# Patient Record
Sex: Male | Born: 1935 | Race: White | Hispanic: No | Marital: Married | Smoking: Former smoker
Health system: Southern US, Community
[De-identification: ages and names within clinical notes are randomized; demographics above are authoritative.]

## PROBLEM LIST (undated history)

## (undated) DIAGNOSIS — H3530 Unspecified macular degeneration: Secondary | ICD-10-CM

## (undated) DIAGNOSIS — I10 Essential (primary) hypertension: Secondary | ICD-10-CM

## (undated) DIAGNOSIS — I499 Cardiac arrhythmia, unspecified: Secondary | ICD-10-CM

## (undated) DIAGNOSIS — K219 Gastro-esophageal reflux disease without esophagitis: Secondary | ICD-10-CM

## (undated) DIAGNOSIS — I5189 Other ill-defined heart diseases: Secondary | ICD-10-CM

## (undated) DIAGNOSIS — Z9289 Personal history of other medical treatment: Secondary | ICD-10-CM

## (undated) DIAGNOSIS — R55 Syncope and collapse: Secondary | ICD-10-CM

## (undated) DIAGNOSIS — T4145XA Adverse effect of unspecified anesthetic, initial encounter: Secondary | ICD-10-CM

## (undated) DIAGNOSIS — T8859XA Other complications of anesthesia, initial encounter: Secondary | ICD-10-CM

## (undated) DIAGNOSIS — Z9889 Other specified postprocedural states: Secondary | ICD-10-CM

## (undated) DIAGNOSIS — I509 Heart failure, unspecified: Secondary | ICD-10-CM

## (undated) DIAGNOSIS — I4819 Other persistent atrial fibrillation: Secondary | ICD-10-CM

## (undated) DIAGNOSIS — I2510 Atherosclerotic heart disease of native coronary artery without angina pectoris: Secondary | ICD-10-CM

## (undated) DIAGNOSIS — IMO0001 Reserved for inherently not codable concepts without codable children: Secondary | ICD-10-CM

## (undated) DIAGNOSIS — C801 Malignant (primary) neoplasm, unspecified: Secondary | ICD-10-CM

## (undated) DIAGNOSIS — R112 Nausea with vomiting, unspecified: Secondary | ICD-10-CM

## (undated) DIAGNOSIS — Z950 Presence of cardiac pacemaker: Secondary | ICD-10-CM

## (undated) DIAGNOSIS — I495 Sick sinus syndrome: Secondary | ICD-10-CM

## (undated) HISTORY — DX: Syncope and collapse: R55

## (undated) HISTORY — DX: Essential (primary) hypertension: I10

## (undated) HISTORY — DX: Malignant (primary) neoplasm, unspecified: C80.1

## (undated) HISTORY — DX: Unspecified macular degeneration: H35.30

## (undated) HISTORY — DX: Atherosclerotic heart disease of native coronary artery without angina pectoris: I25.10

## (undated) HISTORY — DX: Other persistent atrial fibrillation: I48.19

## (undated) HISTORY — DX: Sick sinus syndrome: I49.5

## (undated) HISTORY — PX: OTHER SURGICAL HISTORY: SHX169

## (undated) HISTORY — DX: Other ill-defined heart diseases: I51.89

---

## 2002-11-19 ENCOUNTER — Encounter (INDEPENDENT_AMBULATORY_CARE_PROVIDER_SITE_OTHER): Payer: Self-pay | Admitting: *Deleted

## 2002-11-19 ENCOUNTER — Ambulatory Visit (HOSPITAL_BASED_OUTPATIENT_CLINIC_OR_DEPARTMENT_OTHER): Admission: RE | Admit: 2002-11-19 | Discharge: 2002-11-19 | Payer: Self-pay | Admitting: General Surgery

## 2002-11-19 HISTORY — PX: OTHER SURGICAL HISTORY: SHX169

## 2005-04-15 ENCOUNTER — Inpatient Hospital Stay (HOSPITAL_COMMUNITY): Admission: EM | Admit: 2005-04-15 | Discharge: 2005-04-16 | Payer: Self-pay | Admitting: Emergency Medicine

## 2005-11-27 ENCOUNTER — Ambulatory Visit (HOSPITAL_COMMUNITY): Admission: RE | Admit: 2005-11-27 | Discharge: 2005-11-27 | Payer: Self-pay | Admitting: *Deleted

## 2005-11-27 ENCOUNTER — Encounter (INDEPENDENT_AMBULATORY_CARE_PROVIDER_SITE_OTHER): Payer: Self-pay | Admitting: *Deleted

## 2008-01-25 ENCOUNTER — Emergency Department (HOSPITAL_COMMUNITY): Admission: EM | Admit: 2008-01-25 | Discharge: 2008-01-25 | Payer: Self-pay | Admitting: Emergency Medicine

## 2008-01-26 ENCOUNTER — Emergency Department (HOSPITAL_COMMUNITY): Admission: EM | Admit: 2008-01-26 | Discharge: 2008-01-27 | Payer: Self-pay | Admitting: Emergency Medicine

## 2008-09-17 DIAGNOSIS — Z950 Presence of cardiac pacemaker: Secondary | ICD-10-CM

## 2008-09-17 DIAGNOSIS — I2510 Atherosclerotic heart disease of native coronary artery without angina pectoris: Secondary | ICD-10-CM

## 2008-09-17 HISTORY — DX: Atherosclerotic heart disease of native coronary artery without angina pectoris: I25.10

## 2008-09-17 HISTORY — DX: Presence of cardiac pacemaker: Z95.0

## 2008-09-26 ENCOUNTER — Ambulatory Visit: Payer: Self-pay | Admitting: Internal Medicine

## 2008-09-26 ENCOUNTER — Inpatient Hospital Stay (HOSPITAL_COMMUNITY): Admission: EM | Admit: 2008-09-26 | Discharge: 2008-09-29 | Payer: Self-pay | Admitting: Emergency Medicine

## 2008-09-27 ENCOUNTER — Ambulatory Visit: Payer: Self-pay | Admitting: *Deleted

## 2008-09-27 ENCOUNTER — Encounter (INDEPENDENT_AMBULATORY_CARE_PROVIDER_SITE_OTHER): Payer: Self-pay | Admitting: *Deleted

## 2008-09-28 HISTORY — PX: PACEMAKER INSERTION: SHX728

## 2008-10-04 ENCOUNTER — Inpatient Hospital Stay (HOSPITAL_COMMUNITY): Admission: EM | Admit: 2008-10-04 | Discharge: 2008-10-07 | Payer: Self-pay | Admitting: Emergency Medicine

## 2008-10-04 ENCOUNTER — Ambulatory Visit: Payer: Self-pay | Admitting: Internal Medicine

## 2008-10-14 ENCOUNTER — Encounter (INDEPENDENT_AMBULATORY_CARE_PROVIDER_SITE_OTHER): Payer: Self-pay | Admitting: *Deleted

## 2008-10-14 ENCOUNTER — Ambulatory Visit: Payer: Self-pay

## 2008-10-21 ENCOUNTER — Ambulatory Visit: Payer: Self-pay | Admitting: Cardiology

## 2008-12-28 DIAGNOSIS — I10 Essential (primary) hypertension: Secondary | ICD-10-CM | POA: Insufficient documentation

## 2008-12-28 DIAGNOSIS — K219 Gastro-esophageal reflux disease without esophagitis: Secondary | ICD-10-CM

## 2008-12-28 DIAGNOSIS — R55 Syncope and collapse: Secondary | ICD-10-CM | POA: Insufficient documentation

## 2008-12-28 DIAGNOSIS — I2510 Atherosclerotic heart disease of native coronary artery without angina pectoris: Secondary | ICD-10-CM

## 2008-12-28 DIAGNOSIS — I495 Sick sinus syndrome: Secondary | ICD-10-CM | POA: Insufficient documentation

## 2008-12-28 DIAGNOSIS — H3530 Unspecified macular degeneration: Secondary | ICD-10-CM | POA: Insufficient documentation

## 2008-12-29 ENCOUNTER — Encounter: Payer: Self-pay | Admitting: Cardiology

## 2008-12-29 ENCOUNTER — Ambulatory Visit: Payer: Self-pay | Admitting: Cardiology

## 2009-05-13 ENCOUNTER — Encounter: Payer: Self-pay | Admitting: Cardiology

## 2009-09-14 ENCOUNTER — Encounter: Payer: Self-pay | Admitting: Cardiology

## 2009-09-14 ENCOUNTER — Ambulatory Visit: Payer: Self-pay

## 2009-10-12 ENCOUNTER — Telehealth: Payer: Self-pay | Admitting: Cardiology

## 2009-10-14 ENCOUNTER — Telehealth (INDEPENDENT_AMBULATORY_CARE_PROVIDER_SITE_OTHER): Payer: Self-pay | Admitting: *Deleted

## 2009-10-26 ENCOUNTER — Ambulatory Visit: Payer: Self-pay | Admitting: Cardiology

## 2009-10-26 DIAGNOSIS — E785 Hyperlipidemia, unspecified: Secondary | ICD-10-CM | POA: Insufficient documentation

## 2009-10-28 ENCOUNTER — Ambulatory Visit (HOSPITAL_COMMUNITY): Admission: RE | Admit: 2009-10-28 | Discharge: 2009-10-29 | Payer: Self-pay | Admitting: General Surgery

## 2009-10-28 ENCOUNTER — Encounter (INDEPENDENT_AMBULATORY_CARE_PROVIDER_SITE_OTHER): Payer: Self-pay | Admitting: General Surgery

## 2009-10-28 HISTORY — PX: OTHER SURGICAL HISTORY: SHX169

## 2010-01-25 ENCOUNTER — Ambulatory Visit: Payer: Self-pay | Admitting: Cardiology

## 2010-04-26 ENCOUNTER — Ambulatory Visit: Payer: Self-pay | Admitting: Cardiology

## 2010-07-14 ENCOUNTER — Encounter (INDEPENDENT_AMBULATORY_CARE_PROVIDER_SITE_OTHER): Payer: Self-pay | Admitting: *Deleted

## 2010-07-26 ENCOUNTER — Ambulatory Visit: Payer: Self-pay | Admitting: Cardiology

## 2010-10-17 NOTE — Cardiovascular Report (Signed)
Summary: Office Visit   Office Visit   Imported By: Roderic Ovens 10/25/2009 15:39:37  _____________________________________________________________________  External Attachment:    Type:   Image     Comment:   External Document

## 2010-10-17 NOTE — Cardiovascular Report (Signed)
Summary: TTM   TTM   Imported By: Roderic Ovens 06/15/2010 16:09:46  _____________________________________________________________________  External Attachment:    Type:   Image     Comment:   External Document

## 2010-10-17 NOTE — Cardiovascular Report (Signed)
Summary: Office Visit   Office Visit   Imported By: Roderic Ovens 11/04/2009 13:02:19  _____________________________________________________________________  External Attachment:    Type:   Image     Comment:   External Document
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
## 2010-10-17 NOTE — Miscellaneous (Signed)
Summary: dx correction  Clinical Lists Changes  Problems: Changed problem from PACEMAKER (ICD-V45.[name]01) to PACEMAKER, PERMANENT (ICD-V45.01) changed the incorrect dx code to correct dx code Genella Mech  July 14, 2010 1:00 PM

## 2010-10-17 NOTE — Cardiovascular Report (Signed)
Summary: TTM   TTM   Imported By: Roderic Ovens 03/31/2010 15:53:01  _____________________________________________________________________  External Attachment:    Type:   Image     Comment:   External Document

## 2010-10-17 NOTE — Progress Notes (Signed)
  Phone Note From Other Clinic   Caller: [facility] Pre-Surgical Details for Reason: Pt.Information Initial call taken by: Denny Peon    Faxed all Cardiac over to [phone] [facility]  October 14, 2009 2:00 PM

## 2010-10-17 NOTE — Progress Notes (Signed)
Summary: pt having surgery...pacer question  Phone Note Call from Patient Call back at Home Phone [phone]   Caller: Patient Reason for Call: Talk to Nurse, Talk to Doctor Summary of Call: pt having surgery on 2/11 with Dr. Zachery Dakins and the MD wants to know if he can use a magnet on the pacer or turn it off and on pt would like Dr. Juanda Chance to call and talk to Dr. Zachery Dakins about this and then call pt and let him know the out come Initial call taken by: Omer Jack,  October 12, 2009 8:28 AM  Follow-up for Phone Call        I spoke with the pt's wife- the pt is scheduled for hernia surgery on 2/11 under general anesthesia. I made the pt's wife aware we would touch base with Dr. Annette Stable office today. I will call them back after we do so.Sherri Rad, RN, BSN  October 12, 2009 11:13 AM  I called and spoke with Kendal Hymen at Dr. Annette Stable office. She is not clear what is needed. She will discuss wtih Dr. Zachery Dakins when he comes in to the office this afternoon and they will call us back. Sherri Rad, RN, BSN  October 12, 2009 11:17 AM  I called Dr. Annette Stable office back. I could not get a clear answer as to what was needed from them, I did explain that per Dr. Juanda Chance it is ok to use a magnet if needed. I called the pt's wife back and made her aware of the above.  Follow-up by: Sherri Rad, RN, BSN,  October 13, 2009 4:20 PM

## 2010-10-19 NOTE — Cardiovascular Report (Signed)
Summary: TTM   TTM   Imported By: Roderic Ovens 09/13/2010 15:47:49  _____________________________________________________________________  External Attachment:    Type:   Image     Comment:   External Document

## 2010-10-20 ENCOUNTER — Encounter (INDEPENDENT_AMBULATORY_CARE_PROVIDER_SITE_OTHER): Payer: 59 | Admitting: Internal Medicine

## 2010-10-20 ENCOUNTER — Encounter: Payer: Self-pay | Admitting: Internal Medicine

## 2010-10-20 ENCOUNTER — Ambulatory Visit: Admit: 2010-10-20 | Payer: Self-pay | Admitting: Internal Medicine

## 2010-10-20 DIAGNOSIS — I495 Sick sinus syndrome: Secondary | ICD-10-CM

## 2010-10-20 DIAGNOSIS — I4891 Unspecified atrial fibrillation: Secondary | ICD-10-CM

## 2010-10-20 DIAGNOSIS — I4821 Permanent atrial fibrillation: Secondary | ICD-10-CM | POA: Insufficient documentation

## 2010-10-25 NOTE — Assessment & Plan Note (Signed)
Summary: f1y/per check out/2/9/11hm/rs pt from [facility]/mt   Vital Signs:  Patient profile:   77 year old male Height:      69 inches Weight:      188.75 pounds BMI:     27.97 Pulse rate:   54 / minute Pulse rhythm:   irregular BP sitting:   133 / 67  (left arm) Cuff size:   large  Vitals Entered By: Danielle Rankin, CMA (October 20, 2010 3:28 PM)  Visit Type:  Pacemaker check Primary Provider:  Juleen China  CC:   .  History of Present Illness: The patient presents today for routine cardiology followup. He reports doing very well since last being seen in our clinic. The patient denies symptoms of palpitations, chest pain, shortness of breath, orthopnea, PND, lower extremity edema, dizziness, presyncope, syncope, or neurologic sequela. The patient is tolerating medications without difficulties and is otherwise without complaint today.   Current Medications (verified): 1)  Icaps  Caps (Multiple Vitamins-Minerals) .... Take 1 Once Daily 2)  Prilosec Otc 20 Mg Tbec (Omeprazole Magnesium) .... Take 1 Once Daily 3)  Adult Aspirin Low Strength 81 Mg Tbdp (Aspirin) .... Take 1 Once Daily 4)  Mucinex Dm 30-600 Mg Xr12h-Tab (Dextromethorphan-Guaifenesin) .... Take 1 As Needed 5)  Pravastatin Sodium 80 Mg Tabs (Pravastatin Sodium) .[name].. 1 Tab At Bedtime 6)  Cephalexin 500 Mg Caps (Cephalexin) .[name].. 1 Cap Four Times Daily  Allergies (verified): No Known Drug Allergies  Past History:  Past Medical History: SYNCOPE  s/p PPM for bradycardia nonsustained VT nonobstructive CAD by cath 10/04/08 with nl LV function MACULAR DEGENERATION OF RETINA UNSPECIFIED (ICD-362.50) GERD (ICD-530.81)  Social History: denies tobacco or ETOH  Physical Exam  General:  Well developed, well nourished, in no acute distress. Head:  normocephalic and atraumatic Eyes:  PERRLA/EOM intact; conjunctiva and lids normal. Mouth:  Teeth, gums and palate normal. Oral mucosa normal. Neck:  supple Chest Wall:  pacemaker  pocket is well healed Lungs:  Clear bilaterally to auscultation and percussion. Heart:  RRR, no m/r/g Abdomen:  Bowel sounds positive; abdomen soft and non-tender without masses, organomegaly, or hernias noted. No hepatosplenomegaly. Msk:  Back normal, normal gait. Muscle strength and tone normal. Extremities:  No clubbing or cyanosis. Neurologic:  Alert and oriented x 3. Skin:  Intact without lesions or rashes.   Impression & Recommendations:  Problem # 1:  PACEMAKER, PERMANENT (ICD-V45.01) normal pacemaker function for bradycardia as above  Problem # 2:  SYNCOPE (ICD-780.2) no recurrence s/p PPM  Problem # 3:  PAROXYSMAL ATRIAL FIBRILLATION (ICD-427.31) documented by PPM His CHADS2 score is 1.  He is very clear that he wishes to continue ASA and avoid coumadin at this time  Patient Instructions: 1)  Your physician wants you to follow-up in:6 months with device clinic and 12 months with Dr Johney Frame  Bonita Quin will receive a reminder letter in the mail two months in advance. If you don't receive a letter, please call our office to schedule the follow-up appointment. 2)  Your physician recommends that you continue on your current medications as directed. Please refer to the Current Medication list given to you today.      PPM Specifications Following MD:  Everardo Beals. Juanda Chance, MD     PPM Vendor:  St Jude     PPM Model Number:  [id number]     PPM Serial Number:  [id number] PPM DOI:  09/28/2008     PPM Implanting MD:  Everardo Beals. Juanda Chance, MD  Lead 1  Location: RA     DOI: 09/28/2008     Model #: [id number]     Serial #: [id number]     Status: active Lead 2    Location: RV     DOI: 09/28/2008     Model #: 1788TC     Serial #: [id number]     Status: active  Magnet Response Rate:  BOL 98.6 ERI 86.3  Indications:  Syncope  Explantation Comments:  n/a  PPM Follow Up Battery Voltage:  2.80 V     Battery Est. Longevity:  5.25-7.25 yrs     Pacer Dependent:  No       PPM Device Measurements Atrium  Amplitude:  5.0 mV, Impedance: 505 ohms, Threshold: 0.50 V at 0.4 msec Right Ventricle  Amplitude: 4.4 mV, Impedance: 357 ohms, Threshold: 0.750 V at 0.4 msec  Episodes MS Episodes:  3     Percent Mode Switch:  <1%     Coumadin:  No Ventricular High Rate:  0     Atrial Pacing:  36%     Ventricular Pacing:  13%  Parameters Mode:  DDD     Lower Rate Limit:  55     Upper Rate Limit:  100 Paced AV Delay:  300     Sensed AV Delay:  300 Next Cardiology Appt Due:  04/18/2011 Tech Comments:  3 AMS EPISODES--LONGEST WAS 8 HRS 58 MINUTES.  1 SINUS TACH.  NORMAL DEVICE FUNCTION.  CHANGED VENTRICULAR SENSITIVITY FROM 2.0 TO 1.83mV AND TURNED RATE RESPONSE ON DURING MODE SWITCH.  TTMS THRU [facility]. ROV IN 6 MTHS W/DEVICE CLINIC. Vella Kohler  October 20, 2010 3:51 PM

## 2010-11-02 NOTE — Cardiovascular Report (Signed)
Summary: Office Visit   Office Visit   Imported By: Roderic Ovens 10/25/2010 10:04:47  _____________________________________________________________________  External Attachment:    Type:   Image     Comment:   External Document

## 2010-12-07 LAB — DIFFERENTIAL
Basophils Relative: 0 % (ref 0–1)
Eosinophils Absolute: 0.3 10*3/uL (ref 0.0–0.7)
Eosinophils Relative: 5 % (ref 0–5)
Lymphs Abs: 1.4 10*3/uL (ref 0.7–4.0)
Monocytes Absolute: 0.7 10*3/uL (ref 0.1–1.0)
Monocytes Relative: 12 % (ref 3–12)

## 2010-12-07 LAB — COMPREHENSIVE METABOLIC PANEL
ALT: 24 U/L (ref 0–53)
Albumin: 3.5 g/dL (ref 3.5–5.2)
Alkaline Phosphatase: 71 U/L (ref 39–117)
Calcium: 9 mg/dL (ref 8.4–10.5)
Chloride: 105 mEq/L (ref 96–112)
Creatinine, Ser: 0.87 mg/dL (ref 0.4–1.5)
GFR calc Af Amer: >60 mL/min (ref >60)
Glucose, Bld: 104 mg/dL — ABNORMAL HIGH (ref 70–99)
Potassium: 3.9 mEq/L (ref 3.5–5.1)

## 2010-12-07 LAB — CBC
HCT: 43.9 % (ref 39.0–52.0)
Hemoglobin: 14.9 g/dL (ref 13.0–17.0)
MCHC: 33.9 g/dL (ref 30.0–36.0)
MCV: 90.5 fL (ref 78.0–100.0)
Platelets: 184 10*3/uL (ref 150–400)
RDW: 13.4 % (ref 11.5–15.5)
WBC: 6 10*3/uL (ref 4.0–10.5)

## 2011-01-01 LAB — BASIC METABOLIC PANEL
BUN: 14 mg/dL (ref 6–23)
BUN: 12 mg/dL (ref 6–23)
BUN: 13 mg/dL (ref 6–23)
CO2: 25 mEq/L (ref 19–32)
CO2: 27 mEq/L (ref 19–32)
CO2: 26 mEq/L (ref 19–32)
Calcium: 8.7 mg/dL (ref 8.4–10.5)
Calcium: 8.8 mg/dL (ref 8.4–10.5)
Chloride: 108 mEq/L (ref 96–112)
Chloride: 105 mEq/L (ref 96–112)
Creatinine, Ser: 0.85 mg/dL (ref 0.4–1.5)
Creatinine, Ser: 0.84 mg/dL (ref 0.4–1.5)
Creatinine, Ser: 0.89 mg/dL (ref 0.4–1.5)
GFR calc non Af Amer: >60 mL/min (ref >60)
Glucose, Bld: 109 mg/dL — ABNORMAL HIGH (ref 70–99)
Glucose, Bld: 91 mg/dL (ref 70–99)
Potassium: 4 mEq/L (ref 3.5–5.1)

## 2011-01-01 LAB — CBC
HCT: 44.5 % (ref 39.0–52.0)
HCT: 45.9 % (ref 39.0–52.0)
Hemoglobin: 14.5 g/dL (ref 13.0–17.0)
MCHC: 32.6 g/dL (ref 30.0–36.0)
MCHC: 33.7 g/dL (ref 30.0–36.0)
MCHC: 33.1 g/dL (ref 30.0–36.0)
MCV: 92 fL (ref 78.0–100.0)
MCV: 90.6 fL (ref 78.0–100.0)
Platelets: 218 10*3/uL (ref 150–400)
Platelets: 182 10*3/uL (ref 150–400)
Platelets: 189 10*3/uL (ref 150–400)
RBC: 4.66 MIL/uL (ref 4.22–5.81)
RBC: 4.84 MIL/uL (ref 4.22–5.81)
RDW: 13.5 % (ref 11.5–15.5)
RDW: 12.6 % (ref 11.5–15.5)
RDW: 12.8 % (ref 11.5–15.5)
WBC: 8.2 10*3/uL (ref 4.0–10.5)
WBC: 7.2 10*3/uL (ref 4.0–10.5)

## 2011-01-01 LAB — DIFFERENTIAL
Basophils Absolute: 0 10*3/uL (ref 0.0–0.1)
Basophils Absolute: 0.1 10*3/uL (ref 0.0–0.1)
Basophils Relative: 0 % (ref 0–1)
Eosinophils Absolute: 0.1 10*3/uL (ref 0.0–0.7)
Eosinophils Relative: 1 % (ref 0–5)
Eosinophils Relative: 1 % (ref 0–5)
Lymphocytes Relative: 13 % (ref 12–46)
Lymphs Abs: 1.2 10*3/uL (ref 0.7–4.0)
Monocytes Absolute: 0.7 10*3/uL (ref 0.1–1.0)
Neutrophils Relative %: 75 % (ref 43–77)

## 2011-01-01 LAB — POCT CARDIAC MARKERS: Troponin i, poc: <0.05 ng/mL (ref 0.00–0.09)

## 2011-01-01 LAB — CARDIAC PANEL(CRET KIN+CKTOT+MB+TROPI)
CK, MB: 2.8 ng/mL (ref 0.3–4.0)
Relative Index: 1.8 (ref 0.0–2.5)
Relative Index: INVALID (ref 0.0–2.5)
Total CK: 160 U/L (ref 7–232)
Total CK: 84 U/L (ref 7–232)
Troponin I: 0.01 ng/mL (ref 0.00–0.06)

## 2011-01-01 LAB — CK TOTAL AND CKMB (NOT AT ARMC): Relative Index: 3 — ABNORMAL HIGH (ref 0.0–2.5)

## 2011-01-01 LAB — COMPREHENSIVE METABOLIC PANEL
Albumin: 3.5 g/dL (ref 3.5–5.2)
BUN: 15 mg/dL (ref 6–23)
Creatinine, Ser: 0.84 mg/dL (ref 0.4–1.5)
GFR calc Af Amer: >60 mL/min (ref >60)
Glucose, Bld: 101 mg/dL — ABNORMAL HIGH (ref 70–99)
Total Protein: 6.8 g/dL (ref 6.0–8.3)

## 2011-01-01 LAB — LIPID PANEL
Cholesterol: 183 mg/dL (ref 0–200)
HDL: 65 mg/dL (ref >39)
LDL Cholesterol: 103 mg/dL — ABNORMAL HIGH (ref 0–99)
Total CHOL/HDL Ratio: 2.8 RATIO

## 2011-01-01 LAB — ALBUMIN: Albumin: 3.3 g/dL — ABNORMAL LOW (ref 3.5–5.2)

## 2011-01-01 LAB — APTT: aPTT: 28 seconds (ref 24–37)

## 2011-01-01 LAB — PROTIME-INR: Prothrombin Time: 13 seconds (ref 11.6–15.2)

## 2011-01-18 ENCOUNTER — Telehealth: Payer: Self-pay | Admitting: Internal Medicine
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
## 2011-01-23 ENCOUNTER — Telehealth: Payer: Self-pay | Admitting: Internal Medicine

## 2011-01-23 NOTE — Telephone Encounter (Signed)
This was put on Dr Amedeo Plenty desk top and I have now spoken to patients wife.  She is going to drop off letter from [facility] so I can show Dr Johney Frame.  This way he will know what the DMV is looking for
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
## 2011-01-24 ENCOUNTER — Encounter: Payer: Self-pay | Admitting: Internal Medicine

## 2011-01-24 DIAGNOSIS — I498 Other specified cardiac arrhythmias: Secondary | ICD-10-CM
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
## 2011-02-02 NOTE — Op Note (Signed)
[name], [name]               ACCOUNT NO.:  [id number]   MEDICAL RECORD NO.:  [id number]          PATIENT TYPE:  AMB   LOCATION:  ENDO                         FACILITY:  [facility]   PHYSICIAN:  Georgiana Spinner, M.D.    DATE OF BIRTH:  1931/02/18   DATE OF PROCEDURE:  11/27/2005  DATE OF DISCHARGE:                                 OPERATIVE REPORT   PROCEDURE:  Upper endoscopy.   INDICATIONS:  Hemoccult positivity.   ANESTHESIA:  Demerol 40, Versed 4 milligrams.   PROCEDURE:  With the patient mildly sedated in the left lateral decubitus  position, the Olympus videoscopic endoscope was inserted the mouth and  passed under direct vision through the esophagus into the stomach.  Fundus,  body, antrum, duodenal bulb, second portion duodenum were visualized.  From  this point the endoscope was slowly withdrawn taking circumferential views  of duodenal mucosa until the endoscope was pulled back in the stomach,  placed in retroflexion to view the stomach from below and the endoscope was  straightened and withdrawn taking circumferential views remaining gastric  and esophageal mucosa stopping in the antrum to biopsy a nodularity there  and also in the esophagus where a flat plaque-like lesion was noted. This  was pretty much removed in its entirety with one biopsy bite.  The endoscope  was withdrawn. The patient's vital signs, pulse oximeter remained stable.  The patient tolerated procedure well without apparent complications.   FINDINGS:  Question of plaque-like lesion in the esophagus and antral  nodule, await biopsy reports. The patient will call me for results and  follow-up with me as an outpatient.  Proceed to colonoscopy as planned.           ______________________________  Georgiana Spinner, M.D.     GMO/MEDQ  D:  11/27/2005  T:  11/28/2005  Job:  [id number]

## 2011-02-02 NOTE — Discharge Summary (Signed)
[name], [name]               ACCOUNT NO.:  [id number]   MEDICAL RECORD NO.:  [id number]          PATIENT TYPE:  INP   LOCATION:  3705                         FACILITY:  [facility]   PHYSICIAN:  Hettie Holstein, D.O.    DATE OF BIRTH:  Jan 05, 1931   DATE OF ADMISSION:  04/15/2005  DATE OF DISCHARGE:  04/16/2005                                 DISCHARGE SUMMARY   PRIMARY CARE PHYSICIAN:  [name], M.D.   ADMISSION DIAGNOSIS:  Atypical chest pain.   DISCHARGE DIAGNOSIS:  Atypical chest pain without evidence of acute ischemic  event during hospitalization.   ADDITIONAL DIAGNOSES:  1.  History of peptic ulcer disease status post vagotomy in 1984.  2.  History of herniorrhaphy by Dr. Zachery Dakins.   DISCHARGE MEDICATIONS:  1.  Over-the-counter Prilosec daily for the next two weeks.  2.  Aspirin 81 mg a day.   HISTORY OF PRESENT ILLNESS:  For full details, please review H&P dictated  yesterday.  However, briefly [name] is a 77 year old Caucasian male with  no chronic medical illnesses.  He was well up until the afternoon when he  felt some indigestion and had some improvement with Tums.  He became  diaphoretic, felt clammy and lightheaded.  This lasted for a short period of  time.  He had no headache.  He subsequently came to the emergency department  for evaluation.  He does have prior history of vagotomy.  He has not had  problems such as this in the past.  He had a stress test in 2004.  His  initial cardiac markers were negative in the emergency department, and EKG  was not revealing of ischemia.   HOSPITAL COURSE:  The patient underwent cycled cardiac markers without  evidence of acute ischemic event.  He was pain free during hospitalization.  He did have bradycardia; however, he was hemodynamically stable and  asymptomatic for the duration of his course.   In any event, his primary care physician's office was called, Dr. Adela Lank, and it was recommended he undergo  further outpatient risk  stratification, perhaps stress test in outpatient setting and followup with  Dr. Juleen China.  He does have appointment on April 23, 2005, and I believe this  is at 1 p.m.  he is discharged medically stable and in improved condition.       ESS/MEDQ  D:  04/16/2005  T:  04/16/2005  Job:  [id number]   cc:   [name], M.D.  [address]  [place]  [place] [address]  Fax: [phone]

## 2011-02-02 NOTE — Op Note (Signed)
[name], [name]                           ACCOUNT NO.:  [id number]   MEDICAL RECORD NO.:  [id number]                   PATIENT TYPE:  AMB   LOCATION:  DSC                                  FACILITY:  [facility]   PHYSICIAN:  Anselm Pancoast. Zachery Dakins, M.D.          DATE OF BIRTH:  1931/02/02   DATE OF PROCEDURE:  11/19/2002  DATE OF DISCHARGE:                                 OPERATIVE REPORT   PREOPERATIVE DIAGNOSIS:  Right inguinal hernia, direct.   POSTOPERATIVE DIAGNOSIS:  Right inguinal hernia, direct.   PROCEDURE:  Right inguinal herniorrhaphy with mesh reinforcement.   ANESTHESIA:  Local with heavy sedation.   HISTORY:  The patient is a 77 year old male referred to me by [name],  M.D., who is for management of a symptomatic right inguinal hernia.  This  had been noted on his physical exam approximately a year ago.  It is  significantly larger now.  He has not had any change in bowel movements or  difficulty voiding, and I had repaired basically a duodenal ulcer about 20  years earlier.  He has an obvious bulge in the right groin that is probably  a direct and lemon-size, and then there is a lipoma a little lateral that is  kind of in the right groin area, and I told him I would remove that  simultaneously.   DESCRIPTION OF PROCEDURE:  The patient was taken to the operative suite,  positioned on the OR table.  Sedation had been administered and 1 g of  Kefzol had been administered, and I shaved or clipped the groin hair and  then prepped it with Betadine solution and draped it in a sterile manner.  The inguinal incision area was infiltrated with a mixture of Xylocaine and  Marcaine with adrenalin and about 10 mL were used to infiltrate the inguinal  incision area.  I was a little more lateral because of this large lipoma  that he would like to have excised, and first I went down and actually freed  this lipoma.  It is kind of a finger-like, twice the size of your thumb,  that I freed it up from the surrounding tissue and went and ligated the  pedicle with a fine Vicryl.  The external oblique was then opened, exposing  the external oblique fascia.  I then carefully opened through the external  ring, and the cord structures were elevated with a Penrose.  It was  necessary to extend the incision medially about an inch because of the  exposure and this being a real large direct inguinal hernia.  I separated  the cord structures and inspected the proximal portion.  There was no  evidence of an indirect hernia sac.  Then this hernia, it was about the size  of a lemon or an orange, but the actual floor of the canal where the hernia  came through was about fifty cent size.  I separated the hernia sac from the  underlying fatty tissue of the preperitoneal space in the lateral aspect of  the bladder and reduced all of this after opening the floor and then  repaired this defect with a Shouldice repair starting at the proximal  portion, suturing the inferior tissue after I debrided the predominant  portion of the hernia sac to the lateral edge of the rectus and then brought  the superior lip over this and sutured it to kind of the pre-inguinal fascia  area.  Next a piece of mesh shaped like a sail, slit laterally, was placed  to reinforce the floor.  This was started at the symphysis pubis and the  inferior limb was sutured with a running 2-0 Prolene and then the two tails  sutured together laterally.  The superior flap was sutured down with  interrupted 2-0 Prolenes.  The little ilioinguinal nerve that I thought I  had saved from the cord structures in the dissection did divide and whether  it was actually the ilioinguinal nerve or fascia, I am not sure.  The  external oblique was then closed over the cord structures and then the  Scarpa's fascia closed, and this was with a 3-0 Vicryl, then 3-0 Vicryl to  kind of close the external oblique, and then Scarpa's fascia  interrupted,  and then a 5-0 Dexon subcuticular and Benzoin and Steri-Strips on the skin.  The patient tolerated  the procedure nicely.  His testicle was in its normal position.  He will be  released after a short stay in the recovery room.  He is instructed not to  do any strenuous activity for probably about three or four weeks.  He works  as kind of a, some farming, and he will need to avoid any lifting of about  25 pounds for at least three to four weeks.                                               Anselm Pancoast. Zachery Dakins, M.D.    WJW/MEDQ  D:  11/19/2002  T:  11/19/2002  Job:  [id number]   cc:   [name], M.D.  [address]  [place]  [place] [address]  Fax: [phone]

## 2011-02-02 NOTE — Op Note (Signed)
NAMESAQIB, CAZAREZ               ACCOUNT NO.:  [id number]   MEDICAL RECORD NO.:  [id number]          PATIENT TYPE:  AMB   LOCATION:  ENDO                         FACILITY:  [facility]   PHYSICIAN:  Georgiana Spinner, M.D.    DATE OF BIRTH:  03/03/1931   DATE OF PROCEDURE:  11/27/2005  DATE OF DISCHARGE:                                 OPERATIVE REPORT   PROCEDURE:  Colonoscopy.   INDICATIONS:  Hemoccult positivity.   ANESTHESIA:  Demerol 20, Versed 2 milligrams.   PROCEDURE:  With the patient mildly sedated in the left lateral decubitus  position, a rectal examination was performed which was unremarkable.  Subsequently the Olympus videoscopic colonoscope was inserted in the rectum,  passed under direct vision to the cecum identified by crow's foot of the  cecum and ileocecal valve, both which were photographed.  From this point  colonoscope was slowly withdrawn taking circumferential views of colonic  mucosa stopping in the sigmoid colon where diverticula were noted until we  reached the rectum which appeared normal on direct showed hemorrhoids on  retroflexed view.  The endoscope was straightened and withdrawn. The  patient's vital signs, pulse oximeter remained stable. The patient tolerated  procedure well without apparent complications.   FINDINGS:  Internal hemorrhoids, diverticulosis of sigmoid colon, otherwise  unremarkable exam.   PLAN:  See endoscopy note for further details.           ______________________________  Georgiana Spinner, M.D.     GMO/MEDQ  D:  11/27/2005  T:  11/28/2005  Job:  [id number]

## 2011-02-02 NOTE — H&P (Signed)
NAMEXAYVIER, VALLEZ               ACCOUNT NO.:  [id number]   MEDICAL RECORD NO.:  [id number]          PATIENT TYPE:  INP   LOCATION:  1824                         FACILITY:  [facility]   PHYSICIAN:  Mobolaji B. Bakare, M.D.DATE OF BIRTH:  1930-12-21   DATE OF ADMISSION:  04/15/2005  DATE OF DISCHARGE:                                HISTORY & PHYSICAL   PRIMARY CARE PHYSICIAN:  [name], M.D.   CHIEF COMPLAINT:  Diaphoresis.   HISTORY OF PRESENT ILLNESS:  Mr. Stopa is a pleasant 77 year old Caucasian  male with no chronic medical illness.  He was well until this afternoon when  he had lunch about 3 p.m.  He developed feeling of indigestion and decided  to use TUMS.  The feeling of indigestion improved with TUMS and after lying  down.  About 5 p.m. he got up to go out with his wife and as he was at the  doorway, he became diaphoretic mainly on his face and neck, feeling clammy  and had tremors, lightheadedness.  There was no shortness of breath nor  chest pain.  Symptoms lasted for 5 minutes and he became normal thereafter.  There was no headache.  He subsequently came to the emergency room for  evaluation.   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  He had an exercise stress test 2 years ago which he said was normal.  He has  not had any anginal symptoms.  No dyspnea on exertion.  He could recall that  he had similar symptoms about 2 years ago.  While in the emergency  department, he had one loose bowel movement.  No chills, no rigors, no  cough, sputum production, and no abdominal pain.   REVIEW OF SYSTEMS:  There is no fever, chills, rigors, no cough or sputum  production, no dyspnea on exertion, orthopnea, or PND.  There is no pedal  edema.  No abdominal pain, dysuria, urgency, or increase in frequency of  micturition.   PAST MEDICAL HISTORY:  Bleeding duodenal ulcer in 1984.   PAST SURGICAL HISTORY:  1.  Vagotomy and possibly pyloroplasty  in 1984.  2.  Hernia repair in 2004, right inguinal hernia repair.   MEDICATIONS:  Aspirin one tablet a day.   ALLERGIES:  No known drug allergies.   FAMILY HISTORY:  Father had heart failure and died at the age of 58.  Mother  had heart attack and died at the age of 85.  Two brothers died of heart  attack in their 68's.  No family history of diabetes mellitus or cancer.   SOCIAL HISTORY:  He quit smoking 30 years ago.  He smoked for about 24  hours.  He does not drink alcohol.  He is very much active and independent  of activities of daily living.  He worked at [employer] in his younger days.   PHYSICAL EXAMINATION:  VITAL SIGNS:  Temperature 97.0, blood pressure  146/80, pulse 53, respiratory rate 18, O2 saturation 98% on room air.  GENERAL:  The patient appears comfortable not  in respiratory distress.  HEENT:  Normocephalic and atraumatic head.  Extraocular movements intact.  Pupils equal, round, and reactive to light and accommodation.  No carotid  bruit.  No cervical lymphadenopathy.  No elevated JVD.  LUNGS:  Clear clinically to auscultation.  CARDIOVASCULAR:  S1 and S2 regular, no murmur, no gallop, no rub.  ABDOMEN:  Appears midline scar, nontender, no other systolic murmur heard.  Bowel sounds present.  No palpable organomegaly.  EXTREMITIES:  No pedal edema, calf tenderness __________ .  Pulses 2+  bilaterally.  NEUROLOGY:  No focal neurological deficit.  SKIN:  No rash, no petechiae.   LABORATORY DATA:  Cardiac enzymes x3 at the point of care were all negative,  myoglobin 63.4, troponin less than 4.05.  Urinalysis was insignificant;  specific gravity of 1.025.  White blood cell count 13,000, hemoglobin 13.8,  hematocrit 40.8, neutrophils 80%, lymphocytes 10%, platelets 231.  PT/INR  12.9/1.0, PTT 25, magnesium 2.1, sodium 137, potassium 4.0, chloride 107,  bicarb 25, glucose 157, BUN 18, creatinine 1.0, bilirubin 0.7, alkaline  phosphatase 56, AST 36, ALT 17, total protein  7.0, albumin 3.6, calcium 8.3.   EKG showed sinus bradycardia with a heart rate of 48 beats per minute, low  voltage QRS.  Of note is that the patient's EKG in 2004 also showed marked  sinus bradycardia of 44.   Chest x-ray showed no acute findings.  There is old structure of right 7th  rib which simulated a nodule.   ASSESSMENT:  Mr. Farias is a 77 year old white male with no chronic medical  illness, presenting with indigestion after lunch.  2 hours later he  developed clammy face, neck, tremors, lightheadedness which lasted 5 minutes  and then subsequently had a loose bowel movement in the emergency room.  Of  note is that he had vagotomy in 1984 for peptic ulcer.  These postprandial  autonomic symptoms may be related to vagotomy.  It could possibly be  postprandial hypoglycemia (stated that this was not documented).   Given the family history of CAD, it would be prudent to admit and rule out  for MI and if he rules out, he would benefit from a stress test.   PLAN:  Admit to telemetry.  Check fasting lipid profile, TSH, serial cardiac  enzymes q.8 hours x3, aspirin 325 mg p.o. daily.  Given Mylanta 30 to 60 mL  q.4 hours p.r.n. for indigestion.  The patient already has sinus  bradycardia, would not use beta blocker at this point.   Sinus bradycardia.  The patient is asymptomatic.   Leukocytosis.  This is probably reactive as there is no obvious source of  infection and no fever.  Will follow up CBC and differential in the a.m.      Mobolaji B. Corky Downs, M.D.  Electronically Signed    MBB/MEDQ  D:  04/15/2005  T:  04/16/2005  Job:  [id number]   cc:   Dr. Murrell Converse

## 2011-02-05 ENCOUNTER — Encounter: Payer: Self-pay | Admitting: Internal Medicine

## 2011-02-05 NOTE — Telephone Encounter (Addendum)
Pt calling re letter for the dmv. Pt states letter needs to be in by 5/26.  Pt would like to talk to a nurse.

## 2011-02-05 NOTE — Telephone Encounter (Signed)
Letter faxed to [facility] today  Wife aware

## 2011-02-05 NOTE — Telephone Encounter (Signed)
Dr Johney Frame will do letter

## 2011-04-26 ENCOUNTER — Encounter: Payer: Self-pay | Admitting: Internal Medicine

## 2011-04-26 DIAGNOSIS — I495 Sick sinus syndrome: Secondary | ICD-10-CM

## 2011-05-15 ENCOUNTER — Emergency Department (HOSPITAL_COMMUNITY): Payer: Medicare Other

## 2011-05-15 ENCOUNTER — Inpatient Hospital Stay (HOSPITAL_COMMUNITY)
Admission: EM | Admit: 2011-05-15 | Discharge: 2011-05-16 | DRG: 446 | Disposition: A | Discharge status: Home / Self Care | Payer: Medicare Other | Attending: Surgery | Admitting: Surgery

## 2011-05-15 DIAGNOSIS — R079 Chest pain, unspecified: Secondary | ICD-10-CM

## 2011-05-15 DIAGNOSIS — H3530 Unspecified macular degeneration: Secondary | ICD-10-CM | POA: Diagnosis present

## 2011-05-15 DIAGNOSIS — Z87891 Personal history of nicotine dependence: Secondary | ICD-10-CM

## 2011-05-15 DIAGNOSIS — Z8711 Personal history of peptic ulcer disease: Secondary | ICD-10-CM

## 2011-05-15 DIAGNOSIS — K8020 Calculus of gallbladder without cholecystitis without obstruction: Principal | ICD-10-CM | POA: Diagnosis present

## 2011-05-15 DIAGNOSIS — Z950 Presence of cardiac pacemaker: Secondary | ICD-10-CM

## 2011-05-15 DIAGNOSIS — I495 Sick sinus syndrome: Secondary | ICD-10-CM | POA: Diagnosis present

## 2011-05-15 DIAGNOSIS — R7989 Other specified abnormal findings of blood chemistry: Secondary | ICD-10-CM | POA: Diagnosis present

## 2011-05-15 DIAGNOSIS — K279 Peptic ulcer, site unspecified, unspecified as acute or chronic, without hemorrhage or perforation: Secondary | ICD-10-CM | POA: Diagnosis present

## 2011-05-15 DIAGNOSIS — Z7982 Long term (current) use of aspirin: Secondary | ICD-10-CM

## 2011-05-15 LAB — COMPREHENSIVE METABOLIC PANEL
Albumin: 3.5 g/dL (ref 3.5–5.2)
BUN: 17 mg/dL (ref 6–23)
Chloride: 107 mEq/L (ref 96–112)
Creatinine, Ser: 0.72 mg/dL (ref 0.50–1.35)
GFR calc Af Amer: >60 mL/min (ref >60)
Glucose, Bld: 126 mg/dL — ABNORMAL HIGH (ref 70–99)
Total Bilirubin: 1.1 mg/dL (ref 0.3–1.2)

## 2011-05-15 LAB — DIFFERENTIAL
Basophils Relative: 0 % (ref 0–1)
Eosinophils Absolute: 0 10*3/uL (ref 0.0–0.7)
Monocytes Absolute: 0.6 10*3/uL (ref 0.1–1.0)
Monocytes Relative: 7 % (ref 3–12)

## 2011-05-15 LAB — POCT I-STAT, CHEM 8
BUN: 18 mg/dL (ref 6–23)
Calcium, Ion: 1.15 mmol/L (ref 1.12–1.32)
Creatinine, Ser: 0.9 mg/dL (ref 0.50–1.35)
Glucose, Bld: 121 mg/dL — ABNORMAL HIGH (ref 70–99)
TCO2: 23 mmol/L (ref 0–100)

## 2011-05-15 LAB — CBC
Hemoglobin: 12.7 g/dL — ABNORMAL LOW (ref 13.0–17.0)
MCH: 28.2 pg (ref 26.0–34.0)
MCHC: 33.4 g/dL (ref 30.0–36.0)
MCV: 84.3 fL (ref 78.0–100.0)
Platelets: 206 10*3/uL (ref 150–400)

## 2011-05-15 LAB — LIPASE, BLOOD: Lipase: 33 U/L (ref 11–59)

## 2011-05-15 LAB — POCT I-STAT TROPONIN I: Troponin i, poc: 0 ng/mL (ref 0.00–0.08)

## 2011-05-15 MED ORDER — TECHNETIUM TC 99M MEBROFENIN IV KIT
5.0000 | PACK | Freq: Once | INTRAVENOUS | Status: AC | PRN
  Administered 2011-05-15: 5 via INTRAVENOUS

## 2011-05-16 ENCOUNTER — Telehealth (INDEPENDENT_AMBULATORY_CARE_PROVIDER_SITE_OTHER): Payer: Self-pay

## 2011-05-16 LAB — CBC
HCT: 38 % — ABNORMAL LOW (ref 39.0–52.0)
Platelets: 220 10*3/uL (ref 150–400)
RDW: 14.6 % (ref 11.5–15.5)
WBC: 6 10*3/uL (ref 4.0–10.5)

## 2011-05-16 LAB — COMPREHENSIVE METABOLIC PANEL
ALT: 310 U/L — ABNORMAL HIGH (ref 0–53)
Albumin: 3.1 g/dL — ABNORMAL LOW (ref 3.5–5.2)
BUN: 10 mg/dL (ref 6–23)
Calcium: 8.6 mg/dL (ref 8.4–10.5)
GFR calc Af Amer: >60 mL/min (ref >60)
Glucose, Bld: 123 mg/dL — ABNORMAL HIGH (ref 70–99)
Sodium: 140 mEq/L (ref 135–145)
Total Protein: 6.8 g/dL (ref 6.0–8.3)

## 2011-05-16 LAB — LIPASE, BLOOD: Lipase: 24 U/L (ref 11–59)

## 2011-05-16 NOTE — Telephone Encounter (Signed)
Follow up appointment made.  RMP

## 2011-05-18 ENCOUNTER — Encounter: Payer: Self-pay | Admitting: *Deleted

## 2011-05-24 ENCOUNTER — Encounter (INDEPENDENT_AMBULATORY_CARE_PROVIDER_SITE_OTHER): Payer: Self-pay | Admitting: Family Medicine

## 2011-05-24 ENCOUNTER — Encounter (INDEPENDENT_AMBULATORY_CARE_PROVIDER_SITE_OTHER): Payer: Self-pay | Admitting: General Surgery

## 2011-05-24 NOTE — Discharge Summary (Signed)
[name], [name]               ACCOUNT NO.:  [id number]  MEDICAL RECORD NO.:  [id number]  LOCATION:  5503                         FACILITY:  [facility]  PHYSICIAN:  Ryan Park. Daphine Deutscher, MD  DATE OF BIRTH:  07/18/1931  DATE OF ADMISSION:  05/15/2011 DATE OF DISCHARGE:  05/16/2011                              DISCHARGE SUMMARY   DISCHARGE DIAGNOSIS: 1. Cholelithiasis with elevated LFTs, now resolving. 2. History of peptic ulcer disease/duodenal ulcer. 3. Sick sinus syndrome. 4. History of syncope, non-SVT with permanent transvenous pacemaker. 5. Macular degeneration. 6. History of umbilical hernia repair in February 2011. 7. Right inguinal hernia repair in 2004, [name].  PROCEDURES: 1. Gallbladder ultrasound. 2. HIDA scan.  BRIEF HISTORY:  The patient is a 77 year old gentleman who woke up the evening prior to admission around 9:00 p.m. with epigastric discomfort that felt like indigestion.  He developed cold sweats.  The pain became constant.  He got better after going to bed, but he did not sleep well, Tums he thought made it slightly better.  He had no shortness of breath, no nausea, vomiting or diarrhea and no constipation.  He had country fried steak that evening for dinner.  He continued to belch and burp and the discomfort persisted and he eventually called EMS and was brought to the ER at [facility].  By the time we saw him he reports his abdomen was sore, but it was no longer tender and that the pain had resolved by the time he got in the ambulance and he has remained pain free since this admission.  Workup in the ER included a gallbladder ultrasound which showed multiple gallstones and the gallbladder wall showed some edema, it was at 8 mm.  [name] sign was negative.  He said he was sedated, but the patient reports not receiving anything for sedation.  The common bile duct was 5 mm.  The ultrasound was read as a possible acute cholecystitis.   Labs showed a sodium of 140, potassium of 4.3, chloride of 107, CO2 of 26, BUN was 17, creatinine 0.72, glucose 126, total bilirubin was 1.1, alk phos was elevated at 134, SGOT was 595, SGPT was 285.  Lipase was 33.  Troponin was 0.  White count was 8.7, hemoglobin 12.7, hematocrit was 38,000, platelets were normal at 208,000.  He was seen in the ER by [name] and we decided to admit the patient for observation.  HOSPITAL COURSE:  The patient was admitted, placed on fluids.  He had been started on Zosyn in the ER.  We continued that.  He was completely pain free by the time we saw him.  [name] ordered a HIDA scan which showed symmetric uptake in the liver and prompt excretion into the biliary tree at 15 minutes of gallbladder and small bowel were visualized at 30 minutes.  CCK was administered and ejection fraction was estimated at 86%.  The patient remained pain free, and the following day, labs were again repeated, his lipase was 24.  Total bilirubin was 0.9, alk phos was 148, SGOT was down to 250, SGPT was 310.  His whitecount of 6000, hemoglobin is 12.5, hematocrit 38, platelets  220,000. Electrolytes were normal.  BUN is 10, creatinine is 0.72.  The patient was given the option of undergoing elective cholecystectomy or going home and coming back for outpatient evaluation.  The patient has had two prior surgeries by [name] and asked if he could come back and see him and we are going to make plans for him to follow up with [name].  DISCHARGE MEDS:  He will resume his preadmission medicines which includes aspirin 81 mg daily, ICaps 1 tablet daily, Pravachol 80 mg daily, Prilosec 40 mg daily, and tobramycin ophthalmic drops 1 drop in each eye daily.  Our office will call with an appointment for him to follow up with [name] in the next 1-2 weeks.  CONDITION ON DISCHARGE:  Improved.     [name], P.A.   ______________________________ Ryan Park Daphine Deutscher, MD    WDJ/MEDQ  D:  05/16/2011  T:  05/16/2011  Job:  [id number]  cc:   Ryan Range, MD [name]  Electronically Signed by [name] P.A. on 05/23/2011 10:53:05 PM Electronically Signed by Ryan Murphy MD on 05/24/2011 05:46:24 AM

## 2011-05-24 NOTE — H&P (Signed)
NAMEMarland Kitchen  [name]               ACCOUNT NO.:  [id number]  MEDICAL RECORD NO.:  [id number]  LOCATION:  [facility]                         FACILITY:  [facility]  PHYSICIAN:  Ryan Park. Daphine Deutscher, MD  DATE OF BIRTH:  1931-03-28  DATE OF ADMISSION:  05/15/2011 DATE OF DISCHARGE:                             HISTORY & PHYSICAL   PRIMARY CARE DOCTOR:  Dr. Juleen [name].  CARDIOLOGY:  Dr. Corinda [name].  CHIEF COMPLAINT:  Chest pain, better with Tums, sweaty.  BRIEF HISTORY:  The patient is a 77 year old gentleman who was doing fine until last night at about 9:00 p.m. he got this mid epigastric discomfort, it felt like indigestion.  He also had some cold sweats. The pain was initially constant, got better after going to bed.  He did not sleep well.  Tums made it slightly better.  He denied any shortness of breath.  No nausea, vomiting, or diarrhea.  No constipation.  He had eaten at 6:00 p.m. a full meal including country fried steak.  He denies any pain normally after eating.  He does have dyspnea on exertion which gets better with rest.  He continued belching and burping and because he still had some discomfort this morning, EMS was called, and he was brought to the ER.  He reports his abdomen was sore but not really tender and that the pain has been completely resolved since he got in the ambulance.  He is currently pain free.  PAST MEDICAL HISTORY: 1. Syncope in February 2010 with bradycardia.  He has a history of     NSVT and sick sinus syndrome with a St. Jude permanent transvenous     pacemaker. 2. Macular degeneration. 3. GERD. 4. Noncritical carotid stenosis. 5. In 1984, history of peptic ulcer bleed with surgery.  PAST SURGICAL HISTORY: 1. Umbilical incision repair, Dr. Zachery [name], February 2011. 2. Right inguinal hernia repair, 2004. 3. Duodenal ulcer repair, 1980s. 4. Endoscopy for Hemoccult positive blood in 2007, which shows     hemorrhoids and sigmoid diverticulosis.  FAMILY HISTORY:   Both parents died with MI.  He has a brother with coronary artery disease.  SOCIAL HISTORY:  He smoked for 20 years, none for the last 30.  No alcohol or drugs.  He is married and lives with his wife who is with him.  REVIEW OF SYSTEMS:  Fever, none.  Skin no changes.  He has been eating normally.  His weight is stable.  CV:  No history of syncope or seizure since his permanent pacemaker.  No chest pain.  No palpitations. PULMONARY:  He has dyspnea on exertion.  No coughing, wheezing.  No recent URI.  No PND, orthopnea.  GI:  Negative for GERD.  He is stable on a PPI.  No nausea, vomiting, diarrhea, constipation, or blood in his stool.  GU:  No trouble voiding.  LOWER EXTREMITIES: No edema.  No claudication.  MUSCULOSKELETAL:  Mild arthritis in his finger.  PSYCH: No changes.  HEENT:  He has ongoing macular degeneration, his left eye is the worst.  CURRENT MEDICATIONS:  He is on: 1. Aspirin 81 mg daily. 2. Pravastatin 80 mg daily. 3. ICAPS one daily. 4. Prilosec 40 mg  daily. 5. Tobramycin ophthalmic drops 1 drop in each eye daily.  ALLERGIES:  None known.  PHYSICAL EXAMINATION:  GENERAL:  This is a well-nourished, well- developed white male, in no acute distress. VITAL SIGNS:  At 3:11 a.m. showed a temperature of 97.6, heart rate of 67, blood pressure of 144/63, respiratory rate was 21.  This morning at 6:44, temperature is 98, heart rate 78, blood pressure is 125/75, respiratory rate is 16, and sats 94% on room air. HEENT:  Within normal limits.  Vision in the left eye is poor, he is 20/40 in the right. NECK:  Trachea is midline.  Thyroid is nonpalpable.  No bruits.  No JVD. CHEST:  Respiratory effort is normal.  Chest is nontender.  Clear to auscultation and percussion. CARDIAC:  Normal S1 and S2.  No murmurs or rubs.  Pulses are +2 and equal in upper and lower extremities. ABDOMEN:  Soft, nontender.  Positive bowel sounds.  No palpable hepatosplenomegaly.  No masses,  hernia, or abscesses.  He has a midline surgical scar and a drain scar. GENITOURINARY/RECTAL:  Deferred. LYMPHADENOPATHY:  None palpated, cervical, axillary, or femoral. SKIN:  No changes. NEUROLOGIC:  Cranial nerves intact.  No focal deficits. PSYCH:  Normal affect.  LABORATORY DATA:  Sodium is 140, potassium is 4.3, chloride is 107, CO2 is 26, BUN is 17, creatinine is 0.72, glucose is 126.  Total bilirubin 1.1, alk phos elevated at 134, SGOT 595, SGPT 285, lipase is 33. Troponin is 0.0.  White count is 8.7, hemoglobin 12.7, hematocrit 38, platelets 208,000.  Ultrasound shows multiple gallstones.  Gall bladder wall shows some edema, it is 8 mm.  [name] sign was negative.  They said he was sedated, but the patient has not had anything for sedation here.  Common bile duct was 5 mm.  There is question of acute cholecystitis. Chest x-ray shows no acute changes. His EKG shows sinus rhythm with first-degree AV block and AV pacing, heart rate is 65, PR interval is 237 milliseconds, QTC is 453 milliseconds.  There are no ST or T-wave changes.  IMPRESSION: 1. Cholelithiasis with elevated LFTs. 2. Peptic ulcer disease. 3. History of syncope with an nonsustained ventricular tachycardia,     sick sinus syndrome, and permanent transvenous pacemaker. 4. History of macular degeneration.  His last Cardiolite on September 28, 2008, showed no ischemia with an ejection fraction of 70%.  PLAN:  We are going to admit him, place him on antibiotics, and will decide on cholecystectomy.     [name], P.A.   ______________________________ Ryan Park Daphine Deutscher, MD    WDJ/MEDQ  D:  05/15/2011  T:  05/15/2011  Job:  [id number]  cc:   Ryan Range, MD Dr. Juleen [name]  Electronically Signed by [name] P.A. on 05/23/2011 10:55:14 PM Electronically Signed by Ryan Murphy MD on 05/24/2011 05:46:33 AM

## 2011-05-25 ENCOUNTER — Encounter (INDEPENDENT_AMBULATORY_CARE_PROVIDER_SITE_OTHER): Payer: Self-pay | Admitting: General Surgery

## 2011-05-25 ENCOUNTER — Ambulatory Visit (INDEPENDENT_AMBULATORY_CARE_PROVIDER_SITE_OTHER): Payer: Medicare Other | Admitting: General Surgery

## 2011-05-25 VITALS — BP 130/88 | HR 58 | Wt 185.2 lb

## 2011-05-25 DIAGNOSIS — K8020 Calculus of gallbladder without cholecystitis without obstruction: Secondary | ICD-10-CM

## 2011-05-25 NOTE — Progress Notes (Signed)
Subjective:     Patient ID: [name], male   DOB: Mar 30, 1931, 77 y.o.   MRN: [id number]  HPIMr. [name] is an outpatient by did a tic ulcer surgery approximately 25 years ago and repaired a small umbilical incisional hernia right is not able approximately one year he has had 3 episodes of epigastric pain radiating to his chest and goes to the back most recent was 28 August that occurred about 6:30 in the evening after a large meal and he went to lessen the emergency room and was seen by [name] after the ER physicians his SGOT P. SGPT were elevated his pain subsided he had a cardiac evaluation with no abnormalities noted and [name] felt that it would be okay for him to see me in the office I recommended that his gallbladder be removed the patient comes in today question whether he really needs to have his gallbladder removed and I reviewed the booklet with him showed and the ultrasound and reviewed the liver test and I feel that he definitely needs an elective cholecystectomy and cholangiogram sometime in the near future. The patient has a weak cardiac or chronic pacemaker and [name] used to be his physician [name] all read as his present cardiologist and I would definitely recommend that he have cardiac clearance prior to this same elective urgent cholecystectomy. I would recommend he proceed on in the near future.  Review of Systems  Past Medical History  Diagnosis Date  . Syncope   . Macular degeneration (senile) of retina   . Pacemaker     St. Jude  . Ventricular tachycardia, non-sustained   . CAD (coronary artery disease) 09/2008   Past Surgical History  Procedure Date  . Right inguinal hernia repair 11/19/2002    with mesh  . Umblical hernia 10/28/2009  Patient has had a previous pyloroplasty and vagotomy for bleeding ulcer approximately 20+ years earlier Current Outpatient Prescriptions  Medication Sig Dispense Refill  . aspirin 81 MG tablet Take 81 mg by mouth daily.         . Multiple Vitamins-Minerals (ICAPS AREDS FORMULA PO) Take by mouth.        [name] omeprazole (PRILOSEC) 10 MG capsule Take 10 mg by mouth daily.        . pravastatin (PRAVACHOL) 80 MG tablet Take 80 mg by mouth daily.        [name] alendronate (FOSAMAX) 70 MG tablet Take 70 mg by mouth every 7 (seven) days. Take with a full glass of water on an empty stomach.       . ferrous fumarate (HEMOCYTE - 106 MG FE) 325 (106 FE) MG TABS Take 1 tablet by mouth.        . simvastatin (ZOCOR) 10 MG tablet Take 10 mg by mouth at bedtime.            Objective:   Physical Exam BP 130/88  Pulse 58  Wt 185 lb 3.2 oz (84.006 kg)  Patient's as well developed slightly overweight measured by of who was recently admitted at [facility] following clinically passage of a common duct stone. [name] was a L. diet we'll was the PA and they recommended that he have a cholecystectomy but he wanted to see me as I have operated on several occasions previously says he is not had a father episodes of pain since he was discharged in like to postpone surgery until probably December or January I would recommend that he proceed on  fairly promptly since she's had 3 previous episodes all occurred after large meals.  On physical exam at this time he's not acutely tender in the upper abdomen a scar well healed midline incision and then course his more recent incision around his umbilicus were a bit of umbilical lower incisional hernia repair he's also had a r 2 L surgery is in a reviewed the x-rays and lab studies with him in her muscle skeletal negative no pedal edema ight inguinal hernia repair no evidence or problems layer and how they'll be able to do a laparoscopic cholecystectomy. Please review the little booklets about gallbladder surgery.  Lungs are clear cardiac regular rate pacemaker palpable    Assessment:     Gallstones with probable passage of common duct stones on 3 previous occasions patient is a elective cholecystectomy and  cholangiogram following a cardiac clearance which we will arrange for next week    Plan:    Plan cholecystectomy with cholangiogram and [name] in the next few weeks and continue on a very careful low-fat small feedings not easily going to bed until after he's had his cholecystectomy performed

## 2011-05-25 NOTE — Patient Instructions (Signed)
You have known gallstones and the rapid rising of blood test with pain at returns back quickly to nearly always secondary to passage of a common duct stone and that started from her gallbladder disease booklet explains had this happens and needed to get scheduled for elective cholecystectomy with x-ray at [facility] in the near future. He will need a cardiac clearance by Dr. Johney Frame and possibly move her appointment with Dr Juleen China up about if you desire.  Low-fat small medial and doesn't eat and go to bed until after  gallbladder removed

## 2011-05-31 ENCOUNTER — Telehealth: Payer: Self-pay | Admitting: Internal Medicine

## 2011-05-31 ENCOUNTER — Telehealth (INDEPENDENT_AMBULATORY_CARE_PROVIDER_SITE_OTHER): Payer: Self-pay

## 2011-05-31 NOTE — Telephone Encounter (Signed)
Dr Zachery Dakins wants pt to see Allred before 9-25 for surgical consult, he has a pacer ck 9-20 with device clinic, but I have nothing, he is only here 9-17 and 9-24

## 2011-05-31 NOTE — Telephone Encounter (Signed)
Message left on wife's voicemail to have [name] follow up on cardiac clearance.

## 2011-06-01 NOTE — Telephone Encounter (Signed)
Called and spoke with patient's wife  Clearance faxed over to [facility] on 05/31/11

## 2011-06-06 ENCOUNTER — Encounter: Payer: Medicare Other | Admitting: *Deleted

## 2011-06-07 ENCOUNTER — Other Ambulatory Visit (INDEPENDENT_AMBULATORY_CARE_PROVIDER_SITE_OTHER): Payer: Self-pay | Admitting: General Surgery

## 2011-06-07 ENCOUNTER — Encounter (HOSPITAL_COMMUNITY): Payer: Medicare Other

## 2011-06-07 ENCOUNTER — Encounter: Payer: Self-pay | Admitting: *Deleted

## 2011-06-07 LAB — CBC
HCT: 36.1 % — ABNORMAL LOW (ref 39.0–52.0)
Hemoglobin: 11.7 g/dL — ABNORMAL LOW (ref 13.0–17.0)
RBC: 4.22 MIL/uL (ref 4.22–5.81)
WBC: 7.7 10*3/uL (ref 4.0–10.5)

## 2011-06-07 LAB — DIFFERENTIAL
Basophils Absolute: 0 10*3/uL (ref 0.0–0.1)
Lymphocytes Relative: 26 % (ref 12–46)
Neutro Abs: 4.6 10*3/uL (ref 1.7–7.7)

## 2011-06-07 LAB — COMPREHENSIVE METABOLIC PANEL
Albumin: 3.4 g/dL — ABNORMAL LOW (ref 3.5–5.2)
Alkaline Phosphatase: 85 U/L (ref 39–117)
BUN: 23 mg/dL (ref 6–23)
CO2: 26 mEq/L (ref 19–32)
Chloride: 104 mEq/L (ref 96–112)
GFR calc Af Amer: >60 mL/min (ref >60)
Glucose, Bld: 88 mg/dL (ref 70–99)
Potassium: 4.1 mEq/L (ref 3.5–5.1)
Total Bilirubin: 0.2 mg/dL — ABNORMAL LOW (ref 0.3–1.2)

## 2011-06-07 LAB — SURGICAL PCR SCREEN: MRSA, PCR: NEGATIVE

## 2011-06-08 ENCOUNTER — Other Ambulatory Visit: Payer: Self-pay

## 2011-06-08 ENCOUNTER — Ambulatory Visit (INDEPENDENT_AMBULATORY_CARE_PROVIDER_SITE_OTHER): Payer: Medicare Other | Admitting: *Deleted

## 2011-06-08 ENCOUNTER — Encounter: Payer: Self-pay | Admitting: Internal Medicine

## 2011-06-08 DIAGNOSIS — I498 Other specified cardiac arrhythmias: Secondary | ICD-10-CM

## 2011-06-08 LAB — PACEMAKER DEVICE OBSERVATION
AL AMPLITUDE: 5 mv
AL IMPEDENCE PM: 489 Ohm
AL THRESHOLD: 0.75 V
ATRIAL PACING PM: 35
BAMS-0001: 150 {beats}/min
BAMS-0003: 70 {beats}/min
BATTERY VOLTAGE: 2.79 V
DEVICE MODEL PM: 1284954
RV LEAD AMPLITUDE: 3.7 mv
RV LEAD IMPEDENCE PM: 360 Ohm
RV LEAD THRESHOLD: 0.625 V
VENTRICULAR PACING PM: 18

## 2011-06-08 NOTE — Progress Notes (Signed)
PPM check 

## 2011-06-12 ENCOUNTER — Encounter: Payer: Self-pay | Admitting: *Deleted

## 2011-06-12 ENCOUNTER — Inpatient Hospital Stay (HOSPITAL_COMMUNITY)
Admission: AD | Admit: 2011-06-12 | Discharge: 2011-06-19 | DRG: 417 | Disposition: A | Discharge status: Home / Self Care | Payer: Medicare Other | Source: Ambulatory Visit | Attending: General Surgery | Admitting: General Surgery

## 2011-06-12 ENCOUNTER — Other Ambulatory Visit (INDEPENDENT_AMBULATORY_CARE_PROVIDER_SITE_OTHER): Payer: Self-pay | Admitting: General Surgery

## 2011-06-12 ENCOUNTER — Ambulatory Visit (HOSPITAL_COMMUNITY): Payer: Medicare Other

## 2011-06-12 DIAGNOSIS — I498 Other specified cardiac arrhythmias: Secondary | ICD-10-CM | POA: Diagnosis not present

## 2011-06-12 DIAGNOSIS — I472 Ventricular tachycardia, unspecified: Secondary | ICD-10-CM | POA: Diagnosis present

## 2011-06-12 DIAGNOSIS — K8000 Calculus of gallbladder with acute cholecystitis without obstruction: Principal | ICD-10-CM | POA: Diagnosis present

## 2011-06-12 DIAGNOSIS — Z8711 Personal history of peptic ulcer disease: Secondary | ICD-10-CM

## 2011-06-12 DIAGNOSIS — I5030 Unspecified diastolic (congestive) heart failure: Secondary | ICD-10-CM | POA: Diagnosis not present

## 2011-06-12 DIAGNOSIS — K8010 Calculus of gallbladder with chronic cholecystitis without obstruction: Secondary | ICD-10-CM

## 2011-06-12 DIAGNOSIS — N400 Enlarged prostate without lower urinary tract symptoms: Secondary | ICD-10-CM | POA: Diagnosis present

## 2011-06-12 DIAGNOSIS — J449 Chronic obstructive pulmonary disease, unspecified: Secondary | ICD-10-CM | POA: Diagnosis present

## 2011-06-12 DIAGNOSIS — K660 Peritoneal adhesions (postprocedural) (postinfection): Secondary | ICD-10-CM | POA: Diagnosis present

## 2011-06-12 DIAGNOSIS — I4729 Other ventricular tachycardia: Secondary | ICD-10-CM | POA: Diagnosis present

## 2011-06-12 DIAGNOSIS — I509 Heart failure, unspecified: Secondary | ICD-10-CM | POA: Diagnosis not present

## 2011-06-12 DIAGNOSIS — Z950 Presence of cardiac pacemaker: Secondary | ICD-10-CM

## 2011-06-12 DIAGNOSIS — Z87891 Personal history of nicotine dependence: Secondary | ICD-10-CM

## 2011-06-12 DIAGNOSIS — Z01812 Encounter for preprocedural laboratory examination: Secondary | ICD-10-CM

## 2011-06-12 DIAGNOSIS — J984 Other disorders of lung: Secondary | ICD-10-CM | POA: Diagnosis present

## 2011-06-12 DIAGNOSIS — J95821 Acute postprocedural respiratory failure: Secondary | ICD-10-CM | POA: Diagnosis not present

## 2011-06-12 DIAGNOSIS — N3289 Other specified disorders of bladder: Secondary | ICD-10-CM | POA: Diagnosis present

## 2011-06-12 DIAGNOSIS — J4489 Other specified chronic obstructive pulmonary disease: Secondary | ICD-10-CM | POA: Diagnosis present

## 2011-06-12 DIAGNOSIS — E876 Hypokalemia: Secondary | ICD-10-CM | POA: Diagnosis not present

## 2011-06-12 DIAGNOSIS — I2510 Atherosclerotic heart disease of native coronary artery without angina pectoris: Secondary | ICD-10-CM | POA: Diagnosis present

## 2011-06-12 DIAGNOSIS — K219 Gastro-esophageal reflux disease without esophagitis: Secondary | ICD-10-CM | POA: Diagnosis present

## 2011-06-12 HISTORY — PX: CHOLECYSTECTOMY: SHX55

## 2011-06-12 HISTORY — PX: LAPAROSCOPIC CHOLECYSTECTOMY W/ CHOLANGIOGRAPHY: SUR757

## 2011-06-12 LAB — CBC
Hemoglobin: 11.2 g/dL — ABNORMAL LOW (ref 13.0–17.0)
MCH: 28 pg (ref 26.0–34.0)
RBC: 4 MIL/uL — ABNORMAL LOW (ref 4.22–5.81)

## 2011-06-13 LAB — CBC
HCT: 32.3 % — ABNORMAL LOW (ref 39.0–52.0)
MCV: 84.6 fL (ref 78.0–100.0)
RBC: 3.82 MIL/uL — ABNORMAL LOW (ref 4.22–5.81)
RDW: 13.8 % (ref 11.5–15.5)
WBC: 13.3 10*3/uL — ABNORMAL HIGH (ref 4.0–10.5)

## 2011-06-13 LAB — HEPATIC FUNCTION PANEL
ALT: 62 U/L — ABNORMAL HIGH (ref 0–53)
Alkaline Phosphatase: 73 U/L (ref 39–117)
Bilirubin, Direct: 0.2 mg/dL (ref 0.0–0.3)
Indirect Bilirubin: 0.4 mg/dL (ref 0.3–0.9)
Total Bilirubin: 0.6 mg/dL (ref 0.3–1.2)

## 2011-06-13 LAB — BASIC METABOLIC PANEL
BUN: 14 mg/dL (ref 6–23)
CO2: 23 mEq/L (ref 19–32)
Chloride: 102 mEq/L (ref 96–112)
Creatinine, Ser: 0.68 mg/dL (ref 0.50–1.35)
Glucose, Bld: 150 mg/dL — ABNORMAL HIGH (ref 70–99)

## 2011-06-13 NOTE — Op Note (Addendum)
NAMEAKASHDEEP, [name]               ACCOUNT NO.:  [id number]  MEDICAL RECORD NO.:  [id number]  LOCATION:  1433                         FACILITY:  [facility]  PHYSICIAN:  Anselm Pancoast. Janea Schwenn, M.D.DATE OF BIRTH:  [birth year]  DATE OF PROCEDURE:  06/12/2011 DATE OF DISCHARGE:                              OPERATIVE REPORT   PREOPERATIVE DIAGNOSES: 1. Chronic cholecystitis with recent passage of common duct stone. 2. Status post vagotomy and antrectomy approximately 20 plus years ago     and is 1 year status post an umbilical hernia repair.  OPERATION:  Laparoscopic cholecystectomy with cholangiogram.  SURGEON:  Anselm Pancoast. Zachery Dakins, M.D.  ASSISTANT:  Adolph Pollack, M.D.  There was extensive adhesions from the previous gastric surgery that we had to work around and takedown.  HISTORY:  [name] is a 77 year old Caucasian male who 20 plus years ago had a bleeding duodenal ulcer that required emergency surgery, a vagotomy, and antrectomy.  The patient did well and I saw him about a year ago when he had a little umbilical hernia that I repaired, and he had a little bit of weakness at the inferior aspect of his midline incision.  This was repaired and I did not place any mesh at the time since the fascia appeared strong and intact.  The patient recently has had episodes of epigastric pain.  He is a cardiac patient with a pacemaker, and whether this was cardiac in origin or what, was definitely not determined.  Approximately 2 weekends ago or maybe 3, he had an episode of pain and the pain did not subside promptly and he went to the emergency room.  He was evaluated by the ER physicians.  They checked liver function studies, got an ultrasound of his gallbladder, and the pain too subsided.  Dr. Daphine Deutscher was on-call and he was contacted, and he stated since the pain was subsided and the patient has known cardiac problems that it would probably be best to let him not be admitted for  an emergency surgery, and let him see me in the office since I have operated on two times previously.  I saw him in the office on September 7, and it was about 2 weeks after this episode I saw him in the office.  On reviewing the x-rays and the lab studies, I recommended that we do get cardiac clearance to make sure that there is no evidence of any cardiac issues and in doing elective surgery.  He was hopeful that we could wait until January, but I think since he has definitely passed a common duct stone and has known small gallstones that it would better to proceed sooner than later.  The patient in agreement with this.  He was seen by the cardiologist who said he was stable to proceed with surgery.  Repeat liver function studies today are normal.  His white count was 7700 with hematocrit of 36.  PROCEDURE IN DETAIL:  The patient was taken to the operative suite.  He was given 3 g of Unasyn.  He got PAS stockings.  He had been seen by the Cardiology pacemaker RAP turned his pacer down slightly so that it was less  likely to pace.  The abdomen was prepped with Betadine surgical scrub and then draped in the sterile manner.  Endotracheal tube into place.  A timeout was completed at this time and all the bullets were answered.  The patient was draped in a sterile manner and I elected to make his incision little bit below where he had had the umbilical hernia repair, since he has got kind of a short between the navel and the xiphoid area.  Sharp dissection down through the skin and subcutaneous tissue, the fascia was identified and then very carefully, a little opening was made through the fascia and I dissected in the preperitoneal space area, identified the peritoneum to the right side, and then very carefully entered through the peritoneum.  A pursestring suture of 0 Vicryl was placed, and then the Hassan cannula was introduced and held in place.  You could see the right, you could not see  up in the epigastric area as he has got adhesions, basically from where he has had the umbilical hernia repair and then the midline from where he has had the gastric surgery.  I could see lateral flap and get a 5 mm port, and under direct vision and this was performed.  Dr. Abbey Chatters scrubbed in at this point, and then we took and put a 5 mm camera so I could put a camera on the right side seeing up in the midline, and then I was able to actually lyse some adhesions where there is, I think adhesions to the stomach and positive to the colon, but I could get in right at to the midline to the right of the falciform ligament.  This was then done under direct vision.  The additional 5 mm port was placed.  We could then grab the gallbladder retracted and I had taken down enough adhesions, so we did have a straight shot of the gallbladder even though you could see the 10 mm port easily.  Carefully dissecting, retracting upward and  outward, we noticed there was a little bit of bleeding and this was actually coming from a little rent where the falciform ligament was attached, and then we lightly cauterized this probably 0.5 inch little adhesion from where the falciform attach.  We then carefully freed up dropping down the adhesions from the duodenum and could see the more proximal portion of the gallbladder has got numerous stones, and then we could kind of strip the omentum where it was adherent, very carefully identifying the duodenum, and then could encompass the cystic duct with a right-angle and put a clip at the junction of the cystic duct gallbladder.  I then freed up the area proximally so I can make a little opening in the cystic duct, put in a Taut catheter and then did a cholangiogram.  He is got a dilated common bile duct but has good flow into the duodenum, and it looks lights it is kind of angled distally, probably from where he has had the Billroth I anastomosis.  I went  and removed the [facility] catheter from the cystic duct, triply clipped it proximally, and then divided it.  Dissecting and freeing up the proximal portion of the gallbladder while taking it off with a close-enough fit, we were actually probably two branches of the cystic artery because of all the adhesions, and these were doubly clipped and then divided distally.  We were then dissecting upward at the proximal portion of the gallbladder.  The little posterior branch of  the cystic artery had also been clipped.  There was a little bleeding from the liver bed at this time that was pulsatile, and I grasped it with a dissector and then put light cautery, and then the bleeding appeared to no longer have active bleeding.  We continued kind of freeing up the proximal portion of the gallbladder, the peritoneum over the gallbladder and the liver.  You really could not just develop a plane with the gallbladder one time laterally, but we could grab it.  We continued dissecting freeing the gallbladder from its bed, and then after the gallbladder was freed and placed the Endocatch bag and set the cautery up to about 50, and then cauterized the liver bed, kind of vigorously.  The area where we had the little bleeding first was no longer bleeding.  I did put a little piece of Surgicel in the proximal portion of the liver bed, and then after reinspecting, irrigating, and we cauterized with the setter put, the last half of the Surgicel in that portion of the liver bed also.  I then of course, had already taken the gallbladder up out by switching the camera to the upper 10 mm port, withdrew the gallbladder contained in the bag and then reinserted the Hasson cannula at the umbilicus.  Next, I then irrigated again, inspected everything, both the liver bed and the little rent in the edge of the liver was not bleeding, and then we put a 19 Blake drain in the subhepatic area under direct vision and then brought that  out through the lateral 5 mm port.  I then went ahead and opened up or the switched to the 5 mm camera again laterally so I could see, and then withdrew the camera and I withdrew the Hasson cannula, and then sutured that area with 2 figure-of-eight at the 0 Surgilon with 0 Vicryl.  This is down really below where he has had the previous hernia repair, but I have put in more additional sutures since he has had an umbilical hernia previously.  We then again inspected up in the area where the adhesions had been taken down from the stomach and colon. This looked good, and then I put a figure-of-eight in the Vicryl at the upper 10 mm trocar and withdrew that.  The drain is in good place. There is no evidence of any significant bleeding.  Whether we had a 100- 150 cc of blood loss, I am not sure that it was more than the typical gallbladder, but according to the cannon of irrigating fluid and etc. at the end, it appeared probably about 100 cc.  The drain is just draining kind of serosanguineous fluid.  Again, the endotracheal tube was removed.  The oral tube within the stomach has been removed, and then the patient was extubated and sent to the recovery room in stable postop condition.  His blood pressure ranged from about 160-120, pulse of course was always kind of in the 60s to 70s since he is on a pacer.  The patient is to be kept overnight.  I will plan on leaving the drain in place at least 4 days and hopefully, he will not have any problems with postoperative bleeding.  Before I start him on a diet today, I will wait him have sips of liquids, but only liquids, he had some mild __________ with the sweet liquids and I think it would be better just to kind of, go with sips today so we can start  a nondominant diet tomorrow.     Anselm Pancoast. Zachery Dakins, M.D.     WJW/MEDQ  D:  06/12/2011  T:  06/12/2011  Job:  [id number]  cc:   Hillis Range, MD [address] [place]  [address]  Electronically Signed by Consuello Bossier M.D. on 06/13/2011 11:05:28 AM

## 2011-06-14 ENCOUNTER — Telehealth: Payer: Self-pay | Admitting: Cardiovascular Disease

## 2011-06-14 ENCOUNTER — Ambulatory Visit (HOSPITAL_COMMUNITY): Payer: Medicare Other

## 2011-06-14 DIAGNOSIS — I517 Cardiomegaly: Secondary | ICD-10-CM

## 2011-06-14 DIAGNOSIS — J810 Acute pulmonary edema: Secondary | ICD-10-CM

## 2011-06-14 DIAGNOSIS — J9819 Other pulmonary collapse: Secondary | ICD-10-CM

## 2011-06-14 DIAGNOSIS — R0902 Hypoxemia: Secondary | ICD-10-CM

## 2011-06-14 DIAGNOSIS — R0602 Shortness of breath: Secondary | ICD-10-CM

## 2011-06-14 LAB — CARDIAC PANEL(CRET KIN+CKTOT+MB+TROPI)
CK, MB: 2.6 ng/mL (ref 0.3–4.0)
CK, MB: 2.9 ng/mL (ref 0.3–4.0)
CK, MB: 3.1 ng/mL (ref 0.3–4.0)
Relative Index: 1.3 (ref 0.0–2.5)
Relative Index: 1.3 (ref 0.0–2.5)
Relative Index: 1.4 (ref 0.0–2.5)
Relative Index: 1.7 (ref 0.0–2.5)
Total CK: 211 U/L (ref 7–232)
Total CK: 186 U/L (ref 7–232)
Troponin I: <0.3 ng/mL (ref <0.30)
Troponin I: <0.3 ng/mL (ref <0.30)
Troponin I: <0.3 ng/mL (ref <0.30)

## 2011-06-14 LAB — CBC
MCV: 83.6 fL (ref 78.0–100.0)
Platelets: 158 10*3/uL (ref 150–400)
RBC: 4.21 MIL/uL — ABNORMAL LOW (ref 4.22–5.81)
WBC: 7.6 10*3/uL (ref 4.0–10.5)

## 2011-06-14 LAB — BASIC METABOLIC PANEL
CO2: 18 mEq/L — ABNORMAL LOW (ref 19–32)
Calcium: 8.2 mg/dL — ABNORMAL LOW (ref 8.4–10.5)
Chloride: 99 mEq/L (ref 96–112)
Chloride: 98 mEq/L (ref 96–112)
Creatinine, Ser: 0.75 mg/dL (ref 0.50–1.35)
Creatinine, Ser: 1.01 mg/dL (ref 0.50–1.35)
Glucose, Bld: 143 mg/dL — ABNORMAL HIGH (ref 70–99)
Sodium: 131 mEq/L — ABNORMAL LOW (ref 135–145)

## 2011-06-14 LAB — BLOOD GAS, ARTERIAL
Acid-base deficit: 3.6 mmol/L — ABNORMAL HIGH (ref 0.0–2.0)
Patient temperature: 98.6
TCO2: 16.7 mmol/L (ref 0–100)

## 2011-06-14 NOTE — Telephone Encounter (Signed)
[name] at [facility] long wants you to call her with order for meds for this pt

## 2011-06-14 NOTE — Telephone Encounter (Signed)
Spoke with samatha, she has already gotten her answer Deliah Goody

## 2011-06-15 ENCOUNTER — Inpatient Hospital Stay (HOSPITAL_COMMUNITY): Payer: Medicare Other

## 2011-06-15 DIAGNOSIS — J8410 Pulmonary fibrosis, unspecified: Secondary | ICD-10-CM

## 2011-06-15 LAB — PRO B NATRIURETIC PEPTIDE: Pro B Natriuretic peptide (BNP): 887.3 pg/mL — ABNORMAL HIGH (ref 0–450)

## 2011-06-15 LAB — CBC
Platelets: 143 10*3/uL — ABNORMAL LOW (ref 150–400)
RBC: 3.58 MIL/uL — ABNORMAL LOW (ref 4.22–5.81)
RDW: 14.1 % (ref 11.5–15.5)
WBC: 15.9 10*3/uL — ABNORMAL HIGH (ref 4.0–10.5)

## 2011-06-15 LAB — BASIC METABOLIC PANEL
CO2: 27 mEq/L (ref 19–32)
Chloride: 99 mEq/L (ref 96–112)
Creatinine, Ser: 0.83 mg/dL (ref 0.50–1.35)
GFR calc Af Amer: >60 mL/min (ref >60)
Potassium: 3.3 mEq/L — ABNORMAL LOW (ref 3.5–5.1)
Sodium: 134 mEq/L — ABNORMAL LOW (ref 135–145)

## 2011-06-16 ENCOUNTER — Inpatient Hospital Stay (HOSPITAL_COMMUNITY): Payer: Medicare Other

## 2011-06-16 DIAGNOSIS — I509 Heart failure, unspecified: Secondary | ICD-10-CM

## 2011-06-16 LAB — BASIC METABOLIC PANEL
BUN: 26 mg/dL — ABNORMAL HIGH (ref 6–23)
CO2: 26 mEq/L (ref 19–32)
Calcium: 8.1 mg/dL — ABNORMAL LOW (ref 8.4–10.5)
Chloride: 101 mEq/L (ref 96–112)
Creatinine, Ser: 0.72 mg/dL (ref 0.50–1.35)
GFR calc Af Amer: >60 mL/min (ref >60)
GFR calc non Af Amer: >60 mL/min (ref >60)
Glucose, Bld: 120 mg/dL — ABNORMAL HIGH (ref 70–99)
Potassium: 3.3 mEq/L — ABNORMAL LOW (ref 3.5–5.1)
Sodium: 135 mEq/L (ref 135–145)

## 2011-06-16 LAB — CBC
HCT: 30 % — ABNORMAL LOW (ref 39.0–52.0)
Hemoglobin: 9.9 g/dL — ABNORMAL LOW (ref 13.0–17.0)
MCH: 27.7 pg (ref 26.0–34.0)
MCHC: 33 g/dL (ref 30.0–36.0)
MCV: 83.8 fL (ref 78.0–100.0)
Platelets: 153 10*3/uL (ref 150–400)
RBC: 3.58 MIL/uL — ABNORMAL LOW (ref 4.22–5.81)
RDW: 14.1 % (ref 11.5–15.5)
WBC: 12.5 10*3/uL — ABNORMAL HIGH (ref 4.0–10.5)

## 2011-06-18 DIAGNOSIS — J8410 Pulmonary fibrosis, unspecified: Secondary | ICD-10-CM

## 2011-06-18 DIAGNOSIS — J810 Acute pulmonary edema: Secondary | ICD-10-CM

## 2011-06-18 DIAGNOSIS — R0902 Hypoxemia: Secondary | ICD-10-CM

## 2011-06-18 DIAGNOSIS — I498 Other specified cardiac arrhythmias: Secondary | ICD-10-CM

## 2011-06-18 DIAGNOSIS — J9819 Other pulmonary collapse: Secondary | ICD-10-CM

## 2011-06-18 LAB — BASIC METABOLIC PANEL
Chloride: 105 mEq/L (ref 96–112)
GFR calc Af Amer: >90 mL/min (ref >90)
GFR calc non Af Amer: 90 mL/min — ABNORMAL LOW (ref >90)
Glucose, Bld: 130 mg/dL — ABNORMAL HIGH (ref 70–99)
Potassium: 3.8 mEq/L (ref 3.5–5.1)
Sodium: 139 mEq/L (ref 135–145)

## 2011-06-18 NOTE — Consult Note (Signed)
NAMEMarland Kitchen  AUBURN, HERT               ACCOUNT NO.:  [id number]  MEDICAL RECORD NO.:  [id number]  LOCATION:                                 FACILITY:  PHYSICIAN:  Ryan Pick. Eden Emms, MD, FACCDATE OF BIRTH:  Jan 15, 1931  DATE OF CONSULTATION: DATE OF DISCHARGE:                                CONSULTATION   [name] is a 77 year old patient of Dr. [name], previously seen by Dr. Juanda [name] who retired last year.  He was hospitalized for laparoscopic gallbladder surgery.  We were asked to see him after he had what appears to be some pulmonary edema last evening.  The patient's cardiac history is primarily remarkable for bradycardia. He received a pacemaker back in, I believe 2000.  He has no history of coronary artery disease.  Cath on October 04, 2008, showed no coronary artery disease, with normal LV function.  He also had an echocardiogram done in 2010, which showed an EF of 55%.  He has no history of congestive heart failure.  Last evening, he got acutely short of breath and tachycardic.  He responded to some morphine and Lasix.  His chest x-ray showed vascular congestion.  He had no acute EKG changes and his enzymes have been negative to date.  He feels somewhat improved this morning.  He still has mild shortness of breath.  He is concerned about the inability to pass gas.  He is not having any significant chest pain.  He has not had any significant lower extremity pain or swelling to suggest DVT.  A 10-point review of systems is otherwise negative.  PAST MEDICAL HISTORY:  Remarkable for 1. History of syncope with pacemaker placement in February 2010, [place] variety. 2. GERD. 3. Macular degeneration. 4. History of peptic ulcer disease and previous surgery. 5. Right inguinal hernia repair. 6. Duodenal ulcer repair.  FAMILY HISTORY:  Remarkable for both parents having died of MI.  He has a brother with coronary artery disease.  The patient has not smoked for the last 30  years.  He denies alcohol or drugs.  He is married and lives with his wife and does all ADLs.  MEDICATIONS:  Prior to admission included; 1. Aspirin 1 a day. 2. Pravastatin 80 mg a day. 3. Prilosec 40 mg a day. 4. Tobramycin eye drops.  ALLERGIES:  He has no known allergies.  PHYSICAL EXAMINATION:  GENERAL:  Exam is remarkable for an elderly male currently in no distress. VITAL SIGNS:  Remarkable for being in sinus rhythm at the rate of 96, blood pressure is 130/70.  He sats are 94% on 2 liters. HEENT:  Unremarkable.  Carotids are normal without bruit.  No lymphadenopathy, thyromegaly, JVP elevation. LUNGS:  Currently have no active wheezing and basilar atelectasis bilaterally.  S1-S2 with soft systolic murmur.  PMI normal.  Pacemaker in the left clavicle. ABDOMEN:  Benign.  Bowel sounds positive.  No AAA.  No tenderness.  No bruit.  No hepatosplenomegaly or reflux or tenderness.  He does have a Jackson-Pratt tube in the right mid-quadrant and two other scars from his laparoscopic surgery. EXTREMITIES:  His pulses are intact with trace edema. NEURO:  Nonfocal.  SKIN:  Warm and dry.  No muscular weakness.  EKG shows sinus rhythm with no acute changes.  Chest x-ray shows mild vascular congestion with no pneumonia.  LABORATORY DATA:  Lab work is remarkable for negative cardiac panel x2. Hematocrit is 35.2.  White count is normal at 7.6, potassium 4.1, creatinine 0.75.  IMPRESSION: 1. Episode of dyspnea with vascular congestion, may represent more     acute respiratory distress syndrome, does not appear to be acute     coronary syndrome.  No history of poor LV function.  We will check     a BNP, D-dimer.  Continue diuresis.  Serial chest x-rays, 2-D     echocardiogram to further assess EF.  The patient feels much improved compared to last night. 1. Bradycardia.  Currently not requiring pacemaker therapy and no     evidence of significant ventricular arrhythmias.  Continue to      monitor on telemetry. 2. Status post cholecystectomy.  Routine postop care per Dr.     Zachery [name].     Ryan Pick. Eden Emms, MD, [facility]     PCN/MEDQ  D:  06/14/2011  T:  06/14/2011  Job:  [id number]  Electronically Signed by Charlton Haws MD [facility] on 06/18/2011 06:25:19 PM

## 2011-06-19 ENCOUNTER — Telehealth: Payer: Self-pay | Admitting: Pulmonary Disease

## 2011-06-19 LAB — URINE CULTURE
Colony Count: NO GROWTH
Culture  Setup Time: 201210011300
Culture: NO GROWTH
Special Requests: NEGATIVE

## 2011-06-19 LAB — CBC
Hemoglobin: 9.5 g/dL — ABNORMAL LOW (ref 13.0–17.0)
RBC: 3.47 MIL/uL — ABNORMAL LOW (ref 4.22–5.81)

## 2011-06-19 LAB — BASIC METABOLIC PANEL
CO2: 26 mEq/L (ref 19–32)
Glucose, Bld: 127 mg/dL — ABNORMAL HIGH (ref 70–99)
Potassium: 3.7 mEq/L (ref 3.5–5.1)
Sodium: 138 mEq/L (ref 135–145)

## 2011-06-19 NOTE — Telephone Encounter (Signed)
I spoke with pt wife and pt needs a 2 week hfu. I have scheduled pt with TP since [facility] did not have any openings for 10/17 at 11:45. Wife also states pt was given nebulizer medication rx until seen in the office. Pt does not have a nebulizer machine adn states when she called Dr. Annette Stable office (One who gave rx in the hospital for neb meds)  they advised her they could not help her with that. Please advise Dr. Vassie Loll if okay to send order for pt to have neb machine. [place]  [place], [place]

## 2011-06-20 NOTE — Telephone Encounter (Signed)
He primarily has ILD inmy opinion. Nebs were being used for secretion clearance primarily. OK to stop using them & see how he does. If he has probs, can call in Rx for nebuliser

## 2011-06-20 NOTE — Telephone Encounter (Signed)
Spoke with pt's spouse and notified of recs per RA.  She verbalized understanding and denied any further questions/concerns. Will keep planned HFU with TP for 07/04/11.

## 2011-06-20 NOTE — Telephone Encounter (Signed)
LMTCB

## 2011-06-25 ENCOUNTER — Other Ambulatory Visit (INDEPENDENT_AMBULATORY_CARE_PROVIDER_SITE_OTHER): Payer: Self-pay | Admitting: General Surgery

## 2011-06-25 ENCOUNTER — Emergency Department (HOSPITAL_COMMUNITY)
Admission: EM | Admit: 2011-06-25 | Discharge: 2011-06-25 | Disposition: A | Discharge status: Home / Self Care | Payer: Medicare Other | Attending: Emergency Medicine | Admitting: Emergency Medicine

## 2011-06-25 ENCOUNTER — Telehealth (INDEPENDENT_AMBULATORY_CARE_PROVIDER_SITE_OTHER): Payer: Self-pay

## 2011-06-25 DIAGNOSIS — R32 Unspecified urinary incontinence: Secondary | ICD-10-CM

## 2011-06-25 DIAGNOSIS — R339 Retention of urine, unspecified: Secondary | ICD-10-CM | POA: Insufficient documentation

## 2011-06-25 DIAGNOSIS — Z9089 Acquired absence of other organs: Secondary | ICD-10-CM | POA: Insufficient documentation

## 2011-06-25 DIAGNOSIS — Z950 Presence of cardiac pacemaker: Secondary | ICD-10-CM | POA: Insufficient documentation

## 2011-06-25 LAB — URINALYSIS, ROUTINE W REFLEX MICROSCOPIC
Bilirubin Urine: NEGATIVE
Ketones, ur: NEGATIVE mg/dL
Nitrite: NEGATIVE
Urobilinogen, UA: 1 mg/dL (ref 0.0–1.0)

## 2011-06-25 NOTE — Telephone Encounter (Signed)
C/O unable to void- Foley removed by wife at 9AM this morning- [facility] unable to see patient today- Patient advised to go to [facility] if home techniques does not generate urine production. RMP

## 2011-06-26 LAB — URINE CULTURE: Culture  Setup Time: 201210090128

## 2011-06-27 ENCOUNTER — Other Ambulatory Visit (INDEPENDENT_AMBULATORY_CARE_PROVIDER_SITE_OTHER): Payer: Self-pay | Admitting: General Surgery

## 2011-06-27 ENCOUNTER — Encounter (INDEPENDENT_AMBULATORY_CARE_PROVIDER_SITE_OTHER): Payer: Self-pay | Admitting: General Surgery

## 2011-06-28 ENCOUNTER — Encounter (INDEPENDENT_AMBULATORY_CARE_PROVIDER_SITE_OTHER): Payer: Self-pay | Admitting: General Surgery

## 2011-06-28 ENCOUNTER — Ambulatory Visit (INDEPENDENT_AMBULATORY_CARE_PROVIDER_SITE_OTHER): Payer: Medicare Other | Admitting: General Surgery

## 2011-06-28 VITALS — BP 118/64 | HR 80 | Temp 97.0°F | Resp 18 | Ht 70.0 in | Wt 176.8 lb

## 2011-06-28 DIAGNOSIS — K8010 Calculus of gallbladder with chronic cholecystitis without obstruction: Secondary | ICD-10-CM

## 2011-06-28 NOTE — Progress Notes (Signed)
Subjective:     Patient ID: [name], male   DOB: 1931/09/10, 77 y.o.   MRN: [id number]  HPIMr. [name] returns he is now approximately 2 weeks following a laparoscopic cholecystectomy cholangiogram for subacute cholecystitis and a recent passage of a common duct stone. He had complicated postoperative course and that I was able to do it laparoscopically but he had problems with fluid retention and went into congestive heart failure postoperatively and was sent down into the step down ICU and seen by the cardiologist and medical people the cardiologist felt that he was not have an acute ischemia we did gently diurese him and his chest x-ray was consistent with congestive heart failure. We've got a 6 days but to make sure there was no also pulmonary embolus this occurred more or less 36 hours after his surgery no evidence of any embolus with a question of a lesion in the right lobe of his lung and what it was atelectasis or not we could not be sure. They have question of an area in the similar area about 2 years earlier on chest x-ray but thought that it was probably an old rib fracture but he never had a chest CT during that evaluation. He was seen by the pulmonary people and they have an appointment with [name] the next week for followup evaluation of the pulmonary lesion.  The patient also had problems voiding he has a known history of BPH but is not on any chronic medications that he was having nocturia but to maybe 3 times a night before his surgery. We did discharge him with a Foley catheter in place which he removed Monday which was 5 days later but he was unable to void and he returned to the pressure and then replaced his Foley. I did not actually discussed with the urologist but I was asking him a urology about Flomax etc. and we thought with his cardiac instability that the urology medications for prostate enlargement moderate interfere with his cardiac performance. His mild pedal edema that he  had during the hospitalization has resolved he still got trace edema and his weight is down approximately to 3 times from twin he was in the hospital. He is now only mild antihypertensive medication but he has not seen his cardiologist since he was released from the hospital I wondered if he possibly needs to be digitalized but I think the cardiologist used it very rarely at this time. The patient says that he is just tars easily and does have a more difficult time getting over his cholecystectomy. His bowels are working satisfactorily his incision appears to be healing nicely and he's not tender in the upper abdomen. He had repeat laboratory studies following his cholecystectomy that never showed an elevated liver function studies that he had passed a common An when he originally presented to the ER with the gallbladder problem   Review of Systems Current Outpatient Prescriptions  Medication Sig Dispense Refill  . aspirin 81 MG tablet Take 81 mg by mouth daily.        [name] lisinopril (PRINIVIL,ZESTRIL) 5 MG tablet Take 5 mg by mouth daily.        . Multiple Vitamins-Minerals (ICAPS AREDS FORMULA PO) Take by mouth.        [name] omeprazole (PRILOSEC) 10 MG capsule Take 10 mg by mouth daily.        . pravastatin (PRAVACHOL) 80 MG tablet Take 80 mg by mouth daily.        [name]  tobramycin (TOBREX) 0.3 % ophthalmic solution       . alendronate (FOSAMAX) 70 MG tablet Take 70 mg by mouth every 7 (seven) days. Take with a full glass of water on an empty stomach.       . ferrous fumarate (HEMOCYTE - 106 MG FE) 325 (106 FE) MG TABS Take 1 tablet by mouth.        . simvastatin (ZOCOR) 10 MG tablet Take 10 mg by mouth at bedtime.              Objective:   Physical ExamBP 118/64  Pulse 80  Temp 97 F (36.1 C)  Resp 18  Ht 5\' 10"  (1.778 m)  Wt 176 lb 12.8 oz (80.196 kg)  BMI 25.37 kg/m2 The patient abdomen is soft he is not tender in the right upper quadrant and he still has Steri-Strips on the laparoscopic  incision. There were any had the drain is not draining any fluid and I think he's doing satisfactory from the abdomen. He is having regular bowel movements and has a Foley and has bladder. He does have an appointment with the PA with urology if next week and I would suggest that he see his cardiologist to make sure nothing father needs to be done from his congestive heart failure status     Assessment:    Improving from right abdominal standpoint. I think there is more cardiac issures then  we thought preoperative even though I had a preoperative cardiac clearance before his admission for the cholecystectomy.  The patient is aware about the possible pulmonary lesion and will see [name] next week . Patient will call to get appointment with his cardiologist. The patient to see me in 2 weeks    Plan:     See above

## 2011-06-28 NOTE — Patient Instructions (Signed)
Continue watching your diet and a low salt diet. Weigh daily schedule an appointment with the cardiologist in the near future and make sure that any medications for your prostate are okay your cardiologist

## 2011-07-04 ENCOUNTER — Inpatient Hospital Stay: Payer: Medicare Other | Admitting: Adult Health

## 2011-07-04 NOTE — Discharge Summary (Signed)
[name], [name]               ACCOUNT NO.:  [id number]  MEDICAL RECORD NO.:  [id number]  LOCATION:  1427                         FACILITY:  [facility]  PHYSICIAN:  [name]. [name], M.D.DATE OF BIRTH:  1931/03/03  DATE OF ADMISSION:  06/12/2011 DATE OF DISCHARGE:  06/19/2011                              DISCHARGE SUMMARY   DISCHARGING DIAGNOSES: 1. Subacute cholecystitis with passage of common duct stone. 2. chronic obstructive pulmonary disease. 3. Congestive heart failure. 4. History of benign prostatic hypertrophy.  OPERATION:  Laparoscopic cholecystectomy with cholangiogram.  CONSULTATIONS:  Critical Care and Cardiology.  HISTORY:  [name] is a 77 year old Caucasian male who is now +75 years following a bleeding duodenal ulcer for which I did of a vagotomy and antrectomy, and then approximately 1 year ago, I saw him and he had an umbilical hernia and I repaired this without mesh.  The patient was doing well and recently, he has started having episodes of epigastric pain.  He has a pacemaker, Dr. Charlies [name] used to be his cardiologist, and he is not on any chronic cardiac medications.  Approximately a month prior to the episode that I saw him in the emergency room, he started having episodes of epigastric pain, had a severe attack, went to the emergency room over a week and seen by the ER physician.  They checked liver function studies, got an ultrasound of the gallbladder and he was noted to have gallstones.  Dr. Loraine [name] was on-call and he was contacted and stated, since the pain had subsided and the patient was a known cardiac patient, he thought it would best not to admit him, but let me plan on seeing him in the office and then also see about get a cardiac evaluation preoperatively.  Approximately 2 weeks later, I saw him in the office and on review of the blood work, his liver function studies had bumped up when he went to the ER, and he stated he had not seen  the cardiologist recently and we arranged for a cardiac evaluation, and then repeated his liver function studies and his liver function studies were then back in the normal range.  The patient was seen by Dr. [name], with the [facility], and he cleared him for a cholecystectomy.  The patient has a past history of GERD, for which he is on Protonix and he also states, he gets up 1 or 2 times during the night to void, but he really does not have any definite problems with voiding that he was aware of. He is a previous cigarette smoker, but has not smoked in the last 15 years and was not on any type of inhalers or chronic pulmonary medications.  The patient, preoperatively, had a chest x-ray in the emergency room that showed no acute disease, chronic COPD type changes, and then his cardiac pacemaker was readjusted by the vendor immediately prior to his surgery, but they did not actually feel that it was necessary to deactivate it.  He was taken to surgery, Dr. Abbey [name] assisted.  The patient, of course, because of his previous peptic ulcer surgery through a midline incision and also the surgery at the umbilicus, so I elected to  put the inferior port slightly below, where he had had the previous umbilical hernia repair and then could see, sort of, in the right upper quadrant, and the 5 mm port was placed and then we could actually visualize so that we can take some adhesions down in the epigastric area and get the upper 10 mm trocar in.  The gallbladder was sort of encased in scarring from probably the previous peptic ulcer surgery and also some episodes of gallbladder attacks, but the adhesions were carefully taken down so that we could visualize the base of the gallbladder.  The cystic duct was identified.  Cholangiogram was obtained, and this showed no evidence of any stones within the common bile duct.  The cholangiogram was thought to show no evidence of any common duct stone, and the  patient did have a little bleeding from the bed of the gallbladder area, and I elected to place the [place] drain in. The anesthetist had kind of estimated the blood loss at 200-300 mL, I thought it was closer to 100, but we elected to leave a Blake drain postoperatively, and the patient had a small amount of kind of serosanguineous fluid, but never any significant drainage from the [place] drain.  The following day, his CBC was 32, where his CBC preoperatively had been 36.  Of course, he was receiving IV fluids and we elected to hold the Lovenox on the first postoperative morning.  The patient was able to void, did not have a Foley catheter and stated, he was somewhat short of breath, but he was able to get up, ambulate with some assistance and his vital signs showed a pO2 in the range of for about 95- 92, but he was on oxygen at this time.  We elected to hold the Lovenox on the first postoperative day, and started him on a clear liquid diet. I did decrease his IV fluids, which he had been on 125 cc an hour to 75 cc an hour the following morning when the diet was started.  Over the next 24 hours, however, the patient became more short of breath and then in the early morning on the second postoperative morning, appeared to be in respiratory minimal distress, [name] was on-call, and he was contacted.  The patient was started on a rebreathing bag, and he was transferred to the ICU office.  I saw him shortly after he was transferred, and we had ordered a dose of Lasix 20 mg and cardiac enzymes were checked, but his acute cardiac panel showed no evidence of a myocardial infarction, and it was thought that the patient probably was having issues with basically congestive heart failure.  The critical care saw him, we also asked the [facility] to see him and the cardiology saw him, but their opinion was that he did not have any type of acute cardiac issue as far as ischemia, and he was also  started on Prinivil 5 mg a day and was given Lasix 2-3 hours 20 mg doses.  The patient, over the next 24 hours was definitely better.  His oxygen was back to the 3 liters with cannula.  He spent 2 nights in the ICU, but after basically cardiac enzymes were okay, he was transferred back to the floor.  He had a [place] catheter at that time, was complaining that he was voiding frequently, but he was voiding and then approximately 24 hours after back on the floor, back on telemetry, where he had been originally.  He was chronically having kind of a dilated bladder, and he had an in-and-out catheter on one occasion, but then with basically a still large volume, a Foley catheter was inserted sterilely.  Over the last few days, the patient has continued to improve.  He has been seen by Dr. Sherene Sires.  As far as on evaluating, a lot was actually going on.  As far as for the issues on whether this was a pulmonary embolus or what sounds to be a myocardial infarction, it had been ruled out.  The hepatobiliary scan had been done preoperatively August 28.  By then, he had chest x-rays that showed what looked like a congestive heart failure and then 24 hours later, found that he was still trying to rule out definitely a pulmonary embolus or whatever.  He had been started on low dose Lovenox at that time and this showed no evidence of a pulmonary embolus, but it did show what appears to be a possible lesion in the right lower lung field that is not seen on the chest x-rays and a little bit of an infusion.  Whether this could be a tumor or if it is postoperative atelectasis type changes was not sure and note has, in 2006, the patient had a questionable area that was evaluated with chest x-rays and at that time, they were of the opinion that the image that appeared to be seen in the right base was kind of a secondary to a previous rib fracture, but there was never any CT obtained.  The patient is a formal  cigarette smoker, but has not smoked for the last 10+ years. The patient continued to have chest x-rays, not daily, but the aeration has definitely improved.  He definitely does have COPD, and his last chest x-ray, which was on June 14, 2011, shows stable chest x-ray findings.  Pacemaker wire is in good position.  No definite pleural effusion on pneumothorax, and improved from the hydration or congestive heart failure status.  The patient was seen by Dr. Sherene Sires and associates after he received to the floor, and they suggested that he have a followup with Dr. Cathi Roan in 2 weeks to make sure that this lesion whether it is real or whether it is atelectasis kind of at the base or not. They have also recommended that he be on inhalers t.i.d. Xopenex and that is 0.63 mg t.i.d. inhaler, and he is also continued on the lisinopril 5 mg daily, which he was not on preoperatively.  The patient now has had his Foley catheter for 24 hours.  He is doing well.  His electrolytes and CBC are satisfactory.  Electrolytes; sodium 138, potassium 3.7, chloride 104, glucose is 127, BUN of 17, and creatinine of 0.66 and a CBC shows a white count of 11,000 and hematocrit 29.  The patient weighs about 85 kg and he has not had any real changes one way or the other in his weight postoperatively.  He is definitely improved. He is now back on room air and his 02 sats are ranging from about 90-93 on room air and he is not symptomatic.  The patient is not required any pain medications since the first 24 hours after surgery, and he is back on his usual medications.  That is, Protonix 20 mg a day, baby aspirin daily, and he is on Zocor.  He will continue on the Prinivil 5 mg daily and the inhaler t.i.d. and will see me in the office in 1 week,  and he will see Dr. Cathi Roan in the office in 2 weeks.  I have instructed the patient and his wife how to deflate the balloon to remove his Foley, probably would weigh it and taken it out  next Monday morning, since I do not want him having to go to the emergency room.  I am going to hold off, put him on his Flomax for any issue because of the potential cardiac issues, and we will plan on seeing him in the office for followup of his gallbladder surgery in 1 week.  His Blake drain was removed yesterday.  He never had any bile draining, and his abdomen is soft, and he is tolerating a regular diet with no nausea and vomiting. The patient's liver function studies have remained normal postoperatively, and he is ready for discharge and improved postoperative course.  I expect that the patient has kind of a low grade congestive heart failure that became worse with the IV fluids administered in the perioperative period, and he will have followup evaluation of this right inferior questionable mass seen on CT.     [name]. Zachery Dakins, M.D.     WJW/MEDQ  D:  06/19/2011  T:  06/19/2011  Job:  [id number]  cc:   Dr. [name]  Dr. Cathi Roan  Electronically Signed by Consuello Bossier M.D. on 07/04/2011 08:48:12 AM

## 2011-07-11 ENCOUNTER — Ambulatory Visit (INDEPENDENT_AMBULATORY_CARE_PROVIDER_SITE_OTHER): Payer: Medicare Other | Admitting: General Surgery

## 2011-07-11 ENCOUNTER — Encounter (INDEPENDENT_AMBULATORY_CARE_PROVIDER_SITE_OTHER): Payer: Self-pay | Admitting: General Surgery

## 2011-07-11 VITALS — BP 148/86 | HR 64 | Temp 97.4°F | Resp 18 | Ht 70.0 in | Wt 176.1 lb

## 2011-07-11 DIAGNOSIS — K8000 Calculus of gallbladder with acute cholecystitis without obstruction: Secondary | ICD-10-CM

## 2011-07-11 NOTE — Patient Instructions (Signed)
No restriction on her diet all activities if it's related to fishing.I would like to see you again in approximately one month

## 2011-07-12 ENCOUNTER — Encounter (INDEPENDENT_AMBULATORY_CARE_PROVIDER_SITE_OTHER): Payer: Self-pay | Admitting: General Surgery

## 2011-07-12 NOTE — Progress Notes (Signed)
Subjective:     Patient ID: Carolin Sicks, male   DOB: 01-25-1931, 77 y.o.   MRN: [id number]  HPIMr. Jarboe returns he is now brought months following a lapse Dr. cholecystectomy for subacute cholecystitis recent passage of common duct and with extensive lysis of adhesions because of previous peptic ulcer surgery and also an umbilical hernia surgery in the difficult postoperative course and that he was having problems with voiding he was basically fluid overloaded the cardiologist saw and will and did not think there was any acute cardiac injury he has a pacemaker and then he was in the ICU and develop what appeared to be a lesion in the right middle lobe of his lung he was status to see him today Dr. although 2 weeks after his discharge but he's not since he was having problems with his voiding had a Foley catheter when he went which when removed he could not void and Dr. Patsi Sears is working with him but no definite decision has been made yet of the treatment with the exception that he still got a Foley. He says he's not had any followup episodes of abdominal pain his incisions appear to have healed nicely. He is aware of the possibility of the lung issue could possibly be a tumor and that they have been some question of an abnormality on her chest x-ray approximately 3 years ago that he ultimately felt that this was basically appearance of his ribs from an old injury no CT was done at that time so we await followup of the lung issue.  The only new symptoms and is complaining of his cuff area that a piece as numbness on the anterior right thigh I don't think that could be anything related to the position of the patient since he was laying flat at surgery and he said he had not noticed it previously   Review of Systems no change    Objective:   Physical ExamBP 148/86  Pulse 64  Temp 97.4 F (36.3 C)  Resp 18  Ht 5\' 10"  (1.778 m)  Wt 176 lb 2 oz (79.89 kg)  BMI 25.27 kg/m2  Patient's abdomen was  soft his incisions are healing nicely and there is no tenderness in right upper quadrant he's not jaundiced he states his bowel movements are satisfactory    Assessment:     Patient is recovering from an abdominal standpoint hopefully obtain a myoma barely get his prostate bladder worked inabout the mid of a TURP and hopefully the area of question in the right lung was postoperative atelectasis and that his congestive heart failure has resolved the patient does have a scheduled appointment with both his cardigist and his pulmonary doctor and Dr. Patsi Sears    Plan:     See me one month

## 2011-07-16 ENCOUNTER — Encounter: Payer: Self-pay | Admitting: Internal Medicine

## 2011-07-16 ENCOUNTER — Ambulatory Visit (INDEPENDENT_AMBULATORY_CARE_PROVIDER_SITE_OTHER): Payer: Medicare Other | Admitting: Internal Medicine

## 2011-07-16 DIAGNOSIS — I4891 Unspecified atrial fibrillation: Secondary | ICD-10-CM

## 2011-07-16 DIAGNOSIS — I10 Essential (primary) hypertension: Secondary | ICD-10-CM

## 2011-07-16 DIAGNOSIS — I495 Sick sinus syndrome: Secondary | ICD-10-CM

## 2011-07-16 LAB — PACEMAKER DEVICE OBSERVATION
AL IMPEDENCE PM: 550 Ohm
AL THRESHOLD: 0.75 V
BAMS-0003: 70 {beats}/min
RV LEAD AMPLITUDE: 3.8 mv

## 2011-07-16 NOTE — Assessment & Plan Note (Signed)
Stable No change required today  

## 2011-07-16 NOTE — Assessment & Plan Note (Signed)
Normal pacemaker function See Pace Art report No changes today  

## 2011-07-16 NOTE — Assessment & Plan Note (Signed)
Short episodes of afib by pacemaker interrogation He remains asymptomatic He declines coumadin and will continue ASA.  He will consider coumadin if his afib burden increases, but would like to avoid for now

## 2011-07-16 NOTE — Patient Instructions (Signed)
Your physician wants you to follow-up in: 12 months with Dr Allred You will receive a reminder letter in the mail two months in advance. If you don't receive a letter, please call our office to schedule the follow-up appointment.    [employer] every 3 months  

## 2011-07-16 NOTE — Progress Notes (Signed)
The patient presents today for routine electrophysiology followup.  Since last being seen in our clinic, the patient reports doing reasonably well.  He continues to recover from gall bladder surgery.  He had transient difficulty with fluid overload during his post op course for which he was evaluated by Dr Eden Emms.  His EF was preserved at that time.  Since his discharge, he has continued to make good recovery.  Today, he denies symptoms of palpitations, chest pain, shortness of breath, orthopnea, PND, lower extremity edema, dizziness, presyncope, or syncope.  The patient feels that he is tolerating medications without difficulties and is otherwise without complaint today.   Past Medical History  Diagnosis Date  . Syncope     resolved s/p PPM  . Macular degeneration (senile) of retina   . Sick sinus syndrome     [employer]  . CAD (coronary artery disease) 09/2008  . Hypertension   . Atrial fib/flutter, transient     short asymptomatic epsiodes by PPM interrogation, declines coumadin   Past Surgical History  Procedure Date  . Right inguinal hernia repair 11/19/2002    with mesh  . Umblical hernia 10/28/2009  . Perforated ulcer   . Laparoscopic cholecystectomy w/ cholangiography 06/12/2011  . Cholecystectomy 06/12/11  . Pacemaker insertion     Current Outpatient Prescriptions  Medication Sig Dispense Refill  . aspirin 81 MG tablet Take 81 mg by mouth daily.        . Multiple Vitamins-Minerals (ICAPS AREDS FORMULA PO) Take by mouth.        [name] omeprazole (PRILOSEC) 10 MG capsule Take 10 mg by mouth daily.        [name] tobramycin (TOBREX) 0.3 % ophthalmic solution       . levalbuterol (XOPENEX) 0.63 MG/3ML nebulizer solution Take by nebulization every 8 (eight) hours as needed.       [name] lisinopril (PRINIVIL,ZESTRIL) 5 MG tablet Take 5 mg by mouth daily.        . pravastatin (PRAVACHOL) 80 MG tablet Take 80 mg by mouth daily.          No Known Allergies  History   Social History  . Marital  Status: Married    Spouse Name: N/A    Number of Children: N/A  . Years of Education: N/A   Occupational History  . Not on file.   Social History Main Topics  . Smoking status: Former Games developer  . Smokeless tobacco: Not on file   Comment: quit 1968  . Alcohol Use: No  . Drug Use: No  . Sexually Active: Not on file   Other Topics Concern  . Not on file   Social History Narrative  . No narrative on file   Physical Exam: Filed Vitals:   07/16/11 1435  BP: 134/67  Pulse: 55  Height: 5\' 10"  (1.778 m)  Weight: 178 lb (80.74 kg)    GEN- The patient is well appearing, alert and oriented x 3 today.   Head- normocephalic, atraumatic Eyes-  Sclera clear, conjunctiva pink Ears- hearing intact Oropharynx- clear Neck- supple, no JVP Lymph- no cervical lymphadenopathy Lungs- Clear to ausculation bilaterally, normal work of breathing Chest- pacemaker pocket is well healed Heart- Regular rate and rhythm, no murmurs, rubs or gallops, PMI not laterally displaced GI- soft, NT, ND, + BS Extremities- no clubbing, cyanosis, or edema  Pacemaker interrogation- reviewed in detail today,  See PACEART report  Assessment and Plan:

## 2011-07-21 ENCOUNTER — Emergency Department (HOSPITAL_COMMUNITY)
Admission: EM | Admit: 2011-07-21 | Discharge: 2011-07-21 | Disposition: A | Discharge status: Home / Self Care | Payer: Medicare Other | Attending: Emergency Medicine | Admitting: Emergency Medicine

## 2011-07-21 DIAGNOSIS — I10 Essential (primary) hypertension: Secondary | ICD-10-CM | POA: Insufficient documentation

## 2011-07-21 DIAGNOSIS — Z79899 Other long term (current) drug therapy: Secondary | ICD-10-CM | POA: Insufficient documentation

## 2011-07-21 DIAGNOSIS — Y846 Urinary catheterization as the cause of abnormal reaction of the patient, or of later complication, without mention of misadventure at the time of the procedure: Secondary | ICD-10-CM | POA: Insufficient documentation

## 2011-07-21 DIAGNOSIS — T83091A Other mechanical complication of indwelling urethral catheter, initial encounter: Secondary | ICD-10-CM | POA: Insufficient documentation

## 2011-07-21 DIAGNOSIS — Z7982 Long term (current) use of aspirin: Secondary | ICD-10-CM | POA: Insufficient documentation

## 2011-07-21 DIAGNOSIS — N390 Urinary tract infection, site not specified: Secondary | ICD-10-CM | POA: Insufficient documentation

## 2011-07-21 DIAGNOSIS — Z950 Presence of cardiac pacemaker: Secondary | ICD-10-CM | POA: Insufficient documentation

## 2011-07-21 LAB — POCT I-STAT, CHEM 8
BUN: 15 mg/dL (ref 6–23)
Calcium, Ion: 1.15 mmol/L (ref 1.12–1.32)
Glucose, Bld: 105 mg/dL — ABNORMAL HIGH (ref 70–99)
TCO2: 23 mmol/L (ref 0–100)

## 2011-07-21 LAB — URINALYSIS, ROUTINE W REFLEX MICROSCOPIC
Bilirubin Urine: NEGATIVE
Glucose, UA: NEGATIVE mg/dL
Ketones, ur: NEGATIVE mg/dL
Nitrite: POSITIVE — AB
pH: 7.5 (ref 5.0–8.0)

## 2011-07-21 LAB — URINE MICROSCOPIC-ADD ON

## 2011-07-24 LAB — URINE CULTURE
Colony Count: >=100000
Culture  Setup Time: 201211031745

## 2011-07-25 NOTE — ED Notes (Deleted)
+   urine Chart sent to EDP office for review. 

## 2011-07-25 NOTE — ED Notes (Signed)
+   Urine Chart sent to EDP office for review. 

## 2011-07-30 NOTE — ED Notes (Signed)
+   urine culture. Spoke with patient by phone. Patient stated he was prescribed Keflex by Dr Adriana Simas and is feeling better. Also has appt with PMD 11/12

## 2011-08-06 ENCOUNTER — Encounter (INDEPENDENT_AMBULATORY_CARE_PROVIDER_SITE_OTHER): Payer: Self-pay | Admitting: General Surgery

## 2011-08-06 ENCOUNTER — Ambulatory Visit (INDEPENDENT_AMBULATORY_CARE_PROVIDER_SITE_OTHER): Payer: Medicare Other | Admitting: General Surgery

## 2011-08-06 VITALS — BP 118/88 | HR 60 | Temp 97.8°F | Resp 16 | Ht 70.0 in | Wt 179.6 lb

## 2011-08-06 DIAGNOSIS — K8000 Calculus of gallbladder with acute cholecystitis without obstruction: Secondary | ICD-10-CM

## 2011-08-06 NOTE — Progress Notes (Signed)
Subjective:     Patient ID: [name], male   DOB: Sep 25, 1930, 77 y.o.   MRN: [id number]  HPIMr. Brandon returns he is doing well from the abdominal standpoint and he sustained his cardiologist and he started to continue the Pravachol that we placed the morning but he's not had other cardiac medications but says he does have a sick sinus syndrome. Mr. Yom is 11 problem now is that he still got a Foley catheter and has been seen in the marginal couple x4 Foley not work up her leg he has had a urinary tract infection and is down appointment again by that came on Tuesday week when trying to decide whether he's going need a TURP or just what. He says that he is having no problems with pain after a house his breathing has not been a problem since he left the hospital   Review of [email or web address] No Known Allergies Past Surgical History  Procedure Date  . Right inguinal hernia repair 11/19/2002    with mesh  . Umblical hernia 10/28/2009  . Perforated ulcer   . Laparoscopic cholecystectomy w/ cholangiography 06/12/2011  . Cholecystectomy 06/12/11  . Pacemaker insertion         Objective:   Physical ExamBP 118/88  Pulse 60  Temp(Src) 97.8 F (36.6 C) (Temporal)  Resp 16  Ht 5\' 10"  (1.778 m)  Wt 179 lb 9.6 oz (81.466 kg)  BMI 25.77 kg/m2 Suggestions as far as his lungs are appreciated no evidence of rales he's not tender right upper quadrant his incisions were I. did the umbilical incision for the lap gallbladder is healed nicely and there is no tenderness in the right upper quadrant he still got his Foley catheter and he is to see Dr. Patsi Sears next week to see whether or not a stone a prostate surgery in the next few weeks. I think he's doing fine from the abdominal standpoint and I did not give him a followup appointment to see [place] that we are available if needed if you have any followup problems with abdominal pain     Assessment:     Gradual recovery following a laparoscopic  cholecystectomy for subacute cholecystitis in a patient who's had a previous gastric surgery for bleeding ulcer years ago and in a umbilical hernia repair approximately one year. Presently he still had his Foley catheter is being followed at [facility] and and he may need a TURP in the future    Plan:     Return prn

## 2011-08-15 ENCOUNTER — Encounter (HOSPITAL_COMMUNITY): Payer: Self-pay | Admitting: *Deleted

## 2011-08-15 ENCOUNTER — Emergency Department (HOSPITAL_COMMUNITY)
Admission: EM | Admit: 2011-08-15 | Discharge: 2011-08-15 | Disposition: A | Discharge status: Home Health | Payer: Medicare Other | Attending: Emergency Medicine | Admitting: Emergency Medicine

## 2011-08-15 DIAGNOSIS — I10 Essential (primary) hypertension: Secondary | ICD-10-CM | POA: Insufficient documentation

## 2011-08-15 DIAGNOSIS — R339 Retention of urine, unspecified: Secondary | ICD-10-CM | POA: Insufficient documentation

## 2011-08-15 DIAGNOSIS — R300 Dysuria: Secondary | ICD-10-CM | POA: Insufficient documentation

## 2011-08-15 DIAGNOSIS — I2510 Atherosclerotic heart disease of native coronary artery without angina pectoris: Secondary | ICD-10-CM | POA: Insufficient documentation

## 2011-08-15 LAB — URINALYSIS, ROUTINE W REFLEX MICROSCOPIC
Bilirubin Urine: NEGATIVE
Ketones, ur: NEGATIVE mg/dL
Nitrite: NEGATIVE
Protein, ur: NEGATIVE mg/dL
Urobilinogen, UA: 1 mg/dL (ref 0.0–1.0)

## 2011-08-15 LAB — URINE MICROSCOPIC-ADD ON

## 2011-08-15 NOTE — ED Notes (Addendum)
Pt in c/o urinary retention, states he had a foley placed until yesterday when it was removed at the urologist office, over last few hours pt noted decreased urinary out put and burning with urination- pt is still able to urinate but is not able to empty his bladder, denies abd pain

## 2011-08-15 NOTE — ED Provider Notes (Signed)
History     CSN: [id number] Arrival date & time: 08/15/2011  3:02 AM   First MD Initiated Contact with Patient 08/15/11 [phone]      Chief Complaint  Patient presents with  . Urinary Retention    (Consider location/radiation/quality/duration/timing/severity/associated sxs/prior treatment) HPI Comments: Hx of urinary retention.  Has had to have foley catheter placed in the past - Foley has been present for the last 2 months continuously but was removed in the last 24 hours by his urologist followed by cystoscopy in the office. Since that time he has had significant dysuria with urinating and feels like he cannot empty his bladder. He has a gradually worsening fullness in the suprapubic area. Denies fevers chills nausea vomiting back pain weakness numbness cough shortness of breath.  Symptoms are caused Gradually getting worse Not associated with fevers nausea or vomiting  The history is provided by the patient, a relative and medical records.    Past Medical History  Diagnosis Date  . Syncope     resolved s/p PPM  . Macular degeneration (senile) of retina   . Sick sinus syndrome     St. Jude  . CAD (coronary artery disease) 09/2008  . Hypertension   . Atrial fib/flutter, transient     short asymptomatic epsiodes by PPM interrogation, declines coumadin  . Foley catheter in place   . Cancer     basal cell carcinoma    Past Surgical History  Procedure Date  . Right inguinal hernia repair 11/19/2002    with mesh  . Umblical hernia 10/28/2009  . Perforated ulcer   . Laparoscopic cholecystectomy w/ cholangiography 06/12/2011  . Cholecystectomy 06/12/11  . Pacemaker insertion     History reviewed. No pertinent family history.  History  Substance Use Topics  . Smoking status: Former Games developer  . Smokeless tobacco: Not on file   Comment: quit 1968  . Alcohol Use: No      Review of Systems  Constitutional: Negative for fever and chills.  HENT: Negative for sore throat and  neck pain.   Eyes: Negative for visual disturbance.  Respiratory: Negative for cough and shortness of breath.   Cardiovascular: Negative for chest pain.  Gastrointestinal: Negative for nausea, vomiting, abdominal pain and diarrhea.  Genitourinary: Positive for dysuria and difficulty urinating. Negative for frequency.  Musculoskeletal: Negative for back pain.  Skin: Negative for rash.  Neurological: Negative for weakness, numbness and headaches.  Hematological: Negative for adenopathy.  Psychiatric/Behavioral: Negative for behavioral problems.    Allergies  Review of patient's allergies indicates no known allergies.  Home Medications   Current Outpatient Rx  Name Route Sig Dispense Refill  . ASPIRIN 81 MG PO TABS Oral Take 81 mg by mouth daily.      [name] LEVALBUTEROL HCL 0.63 MG/3ML IN NEBU      . LISINOPRIL 5 MG PO TABS Oral Take 5 mg by mouth daily.      . ICAPS AREDS FORMULA PO Oral Take by mouth.      [name] NITROFURANTOIN MONOHYD MACRO 100 MG PO CAPS  BID times 48H.    [name] OMEPRAZOLE 10 MG PO CPDR Oral Take 20 mg by mouth daily.       BP 109/59  Pulse 63  Temp(Src) 98.3 F (36.8 C) (Oral)  Resp 18  SpO2 97%  Physical Exam  Nursing note and vitals reviewed. Constitutional: He appears well-developed and well-nourished. No distress.  HENT:  Head: Normocephalic and atraumatic.  Mouth/Throat: Oropharynx is clear and moist.  No oropharyngeal exudate.  Eyes: Conjunctivae and EOM are normal. Pupils are equal, round, and reactive to light. Right eye exhibits no discharge. Left eye exhibits no discharge. No scleral icterus.  Neck: Normal range of motion. Neck supple. No JVD present. No thyromegaly present.  Cardiovascular: Normal rate, regular rhythm, normal heart sounds and intact distal pulses.  Exam reveals no gallop and no friction rub.   No murmur heard. Pulmonary/Chest: Effort normal and breath sounds normal. No respiratory distress. He has no wheezes. He has no rales.  Abdominal:  Soft. Bowel sounds are normal. He exhibits no distension and no mass. There is tenderness ( Mild suprapubic tenderness and fullness).  Musculoskeletal: Normal range of motion. He exhibits no edema and no tenderness.  Lymphadenopathy:    He has no cervical adenopathy.  Neurological: He is alert. Coordination normal.  Skin: Skin is warm and dry. No rash noted. No erythema.  Psychiatric: He has a normal mood and affect. His behavior is normal.    ED Course  Procedures (including critical care time)  Labs Reviewed  URINALYSIS, ROUTINE W REFLEX MICROSCOPIC - Abnormal; Notable for the following:    Hgb urine dipstick SMALL (*)    Leukocytes, UA MODERATE (*)    All other components within normal limits  URINE MICROSCOPIC-ADD ON - Abnormal; Notable for the following:    Bacteria, UA FEW (*)    All other components within normal limits  URINE CULTURE   No results found.   1. Urinary retention       MDM  Will [address] Foley catheter, leg back, followup with urology. Rule out infection  UA clean, VS reassuring        Vida Roller, MD 08/15/11 [phone]

## 2011-08-15 NOTE — ED Notes (Signed)
Pt discharged home with wife with foley leg bag in place

## 2011-08-16 ENCOUNTER — Other Ambulatory Visit: Payer: Self-pay | Admitting: Urology

## 2011-08-16 LAB — URINE CULTURE: Culture: NO GROWTH

## 2011-08-20 ENCOUNTER — Telehealth (INDEPENDENT_AMBULATORY_CARE_PROVIDER_SITE_OTHER): Payer: Self-pay | Admitting: General Surgery

## 2011-08-20 NOTE — Telephone Encounter (Signed)
The patients wife contacted the office and stated that [name] is having diarrhea X several days, afebrile and they are curious if it would have anything to do with his gallbladder being removed in September. She was instant on hearing from Dr Zachery Dakins because she stated that he said to call if any problems.[name]

## 2011-08-21 NOTE — Telephone Encounter (Signed)
Note given to Dr. Zachery Dakins

## 2011-08-23 ENCOUNTER — Emergency Department (HOSPITAL_COMMUNITY)
Admission: EM | Admit: 2011-08-23 | Discharge: 2011-08-23 | Disposition: A | Discharge status: Home / Self Care | Payer: Medicare Other | Attending: Emergency Medicine | Admitting: Emergency Medicine

## 2011-08-23 ENCOUNTER — Encounter (HOSPITAL_COMMUNITY): Payer: Self-pay | Admitting: Emergency Medicine

## 2011-08-23 DIAGNOSIS — R339 Retention of urine, unspecified: Secondary | ICD-10-CM | POA: Insufficient documentation

## 2011-08-23 DIAGNOSIS — I10 Essential (primary) hypertension: Secondary | ICD-10-CM | POA: Insufficient documentation

## 2011-08-23 DIAGNOSIS — N390 Urinary tract infection, site not specified: Secondary | ICD-10-CM | POA: Insufficient documentation

## 2011-08-23 DIAGNOSIS — R10819 Abdominal tenderness, unspecified site: Secondary | ICD-10-CM | POA: Insufficient documentation

## 2011-08-23 LAB — URINALYSIS, ROUTINE W REFLEX MICROSCOPIC
Bilirubin Urine: NEGATIVE
Nitrite: POSITIVE — AB
Protein, ur: 100 mg/dL — AB
Specific Gravity, Urine: 1.021 (ref 1.005–1.030)
Urobilinogen, UA: 1 mg/dL (ref 0.0–1.0)

## 2011-08-23 LAB — URINE MICROSCOPIC-ADD ON

## 2011-08-23 MED ORDER — CIPROFLOXACIN HCL 500 MG PO TABS: 500.0000 mg | ORAL_TABLET | Freq: Two times a day (BID) | ORAL | Status: AC

## 2011-08-23 NOTE — ED Provider Notes (Signed)
History     CSN: [id number] Arrival date & time: 08/23/2011  8:17 PM   First MD Initiated Contact with Patient 08/23/11 2121      Chief Complaint  Patient presents with  . Urinary Retention    HPI  History provided by the patient and spouse. Patient with history of BPH and recent visits to the emergency room for urinary tension presents today with symptoms of urinary retention beginning around 2 PM. Patient was seen in emergency room one week ago with a Foley catheter in place and leg bag. Patient states it has been draining and emptying well up until 2 PM today when he began to have very low output and increasing lower abdominal pain and pressure. Symptoms have gradually worsened throughout the day. Symptoms are improved by nothing. Patient denies any flank pain, fever, chills, sweats, nausea or vomiting. he currently being followed by Dr. Patsi Sears with urology, with the planned surgery on the 20th.   Past Medical History  Diagnosis Date  . Syncope     resolved s/p PPM  . Macular degeneration (senile) of retina   . Sick sinus syndrome     St. Jude  . CAD (coronary artery disease) 09/2008  . Hypertension   . Atrial fib/flutter, transient     short asymptomatic epsiodes by PPM interrogation, declines coumadin  . Foley catheter in place   . Cancer     basal cell carcinoma    Past Surgical History  Procedure Date  . Right inguinal hernia repair 11/19/2002    with mesh  . Umblical hernia 10/28/2009  . Perforated ulcer   . Laparoscopic cholecystectomy w/ cholangiography 06/12/2011  . Cholecystectomy 06/12/11  . Pacemaker insertion     History reviewed. No pertinent family history.  History  Substance Use Topics  . Smoking status: Former Games developer  . Smokeless tobacco: Not on file   Comment: quit 1968  . Alcohol Use: No      Review of Systems  Constitutional: Negative for fever and chills.  Respiratory: Negative for cough and shortness of breath.   Gastrointestinal:  Positive for abdominal pain. Negative for nausea, vomiting, diarrhea and constipation.  Genitourinary: Positive for decreased urine volume. Negative for dysuria, hematuria, flank pain, penile swelling and penile pain.  All other systems reviewed and are negative.    Allergies  Review of patient's allergies indicates no known allergies.  Home Medications   Current Outpatient Rx  Name Route Sig Dispense Refill  . ASPIRIN 81 MG PO TABS Oral Take 81 mg by mouth daily.      [name] LISINOPRIL 5 MG PO TABS Oral Take 5 mg by mouth daily.      . ICAPS AREDS FORMULA PO Oral Take 1 capsule by mouth daily.     [name] OMEPRAZOLE 20 MG PO CPDR Oral Take 20 mg by mouth daily.      [name] PRAVASTATIN SODIUM PO Oral Take 1 tablet by mouth daily.        BP 124/89  Pulse 60  Temp(Src) 98 F (36.7 C) (Oral)  Resp 16  Wt 179 lb (81.194 kg)  SpO2 96%  Physical Exam  Nursing note and vitals reviewed. Constitutional: He is oriented to person, place, and time. He appears well-developed and well-nourished. No distress.  HENT:  Head: Normocephalic.  Cardiovascular: Regular rhythm and normal heart sounds.   Pulmonary/Chest: Effort normal and breath sounds normal.  Abdominal: Soft. There is tenderness in the suprapubic area. There is no rebound, no guarding, no  tenderness at McBurney's point and negative Murphy's sign.  Genitourinary: Penis normal.       Normal external genitals Foley catheter with leg bag in place. Minimal urinary output in leg bag. No gross hematuria.  Neurological: He is alert and oriented to person, place, and time.    ED Course  Procedures (including critical care time)   Results for orders placed during the hospital encounter of 08/23/11  URINALYSIS, ROUTINE W REFLEX MICROSCOPIC      Component Value Range   Color, Urine YELLOW  YELLOW    APPearance CLOUDY (*) CLEAR    Specific Gravity, Urine 1.021  1.005 - 1.030    pH 6.5  5.0 - 8.0    Glucose, UA NEGATIVE  NEGATIVE (mg/dL)   Hgb  urine dipstick LARGE (*) NEGATIVE    Bilirubin Urine NEGATIVE  NEGATIVE    Ketones, ur NEGATIVE  NEGATIVE (mg/dL)   Protein, ur 161 (*) NEGATIVE (mg/dL)   Urobilinogen, UA 1.0  0.0 - 1.0 (mg/dL)   Nitrite POSITIVE (*) NEGATIVE    Leukocytes, UA LARGE (*) NEGATIVE   URINE MICROSCOPIC-ADD ON      Component Value Range   WBC, UA TOO NUMEROUS TO COUNT  <3 (WBC/hpf)   RBC / HPF 3-6  <3 (RBC/hpf)   Bacteria, UA MANY (*) RARE      1. Urinary retention   2. UTI (lower urinary tract infection)       MDM  9:30 PM patient seen and evaluated. Patient in no acute distress but uncomfortable.  9:45 PM nurse currently flushing Foley catheter.  10:00 PM nurse was unable to flush the Foley catheter and the catheter was removed and replaced. Patient's old catheter was clogged with thick sediment. Patient having immediate relief once new Foley was placed. Plan to send UA and urine culture. Pt has followup appointment with Dr. Patsi Sears this week.   Angus Seller, [place] 08/24/11 [phone]

## 2011-08-23 NOTE — ED Notes (Signed)
Pt alert, nad, c/o unable to void, pt has indwelling foley, hx urinary retention since Sept, s/p abd sx

## 2011-08-23 NOTE — ED Notes (Signed)
Pt with c/o urinary retention since 4 pm this afternoon. Pt states that he had an indwelling catheter placed her in the ED about 8 days ago. Pt denies problems before today. Pt states he feels pressure and bloating. Rates pain "12/10".

## 2011-08-27 ENCOUNTER — Encounter: Payer: Self-pay | Admitting: Internal Medicine

## 2011-08-27 DIAGNOSIS — I498 Other specified cardiac arrhythmias: Secondary | ICD-10-CM

## 2011-08-27 LAB — URINE CULTURE: Culture  Setup Time: 201212070144

## 2011-08-27 NOTE — ED Provider Notes (Signed)
Medical screening examination/treatment/procedure(s) were performed by non-physician practitioner and as supervising physician I was immediately available for consultation/collaboration.   Zipporah Finamore A. Wendle Kina, MD 08/27/11 0709 

## 2011-08-30 ENCOUNTER — Encounter (HOSPITAL_COMMUNITY): Payer: Self-pay | Admitting: Pharmacy Technician

## 2011-09-03 ENCOUNTER — Encounter (HOSPITAL_COMMUNITY): Payer: Self-pay

## 2011-09-03 ENCOUNTER — Ambulatory Visit (HOSPITAL_COMMUNITY)
Admission: RE | Admit: 2011-09-03 | Discharge: 2011-09-03 | Disposition: A | Discharge status: Home / Self Care | Payer: Medicare Other | Source: Ambulatory Visit | Attending: Urology | Admitting: Urology

## 2011-09-03 DIAGNOSIS — Z01812 Encounter for preprocedural laboratory examination: Secondary | ICD-10-CM | POA: Insufficient documentation

## 2011-09-03 HISTORY — DX: Nausea with vomiting, unspecified: R11.2

## 2011-09-03 HISTORY — DX: Other complications of anesthesia, initial encounter: T88.59XA

## 2011-09-03 HISTORY — DX: Other specified postprocedural states: Z98.890

## 2011-09-03 HISTORY — DX: Adverse effect of unspecified anesthetic, initial encounter: T41.45XA

## 2011-09-03 HISTORY — DX: Gastro-esophageal reflux disease without esophagitis: K21.9

## 2011-09-03 HISTORY — DX: Heart failure, unspecified: I50.9

## 2011-09-03 LAB — BASIC METABOLIC PANEL
CO2: 24 mEq/L (ref 19–32)
Chloride: 105 mEq/L (ref 96–112)
GFR calc Af Amer: >90 mL/min (ref >90)
Potassium: 4 mEq/L (ref 3.5–5.1)
Sodium: 137 mEq/L (ref 135–145)

## 2011-09-03 LAB — CBC
Platelets: 253 10*3/uL (ref 150–400)
RBC: 4.75 MIL/uL (ref 4.22–5.81)
RDW: 15.1 % (ref 11.5–15.5)
WBC: 7.3 10*3/uL (ref 4.0–10.5)

## 2011-09-03 LAB — SURGICAL PCR SCREEN
MRSA, PCR: NEGATIVE
Staphylococcus aureus: NEGATIVE

## 2011-09-03 NOTE — Pre-Procedure Instructions (Signed)
07/16/11 LOV with EP MD note on chart  09/04/11 Called and spoke with pt's wife over phone .  Instructed on new arrival time of 0830 am on 09/06/2011 and time of surgery to 1030-1200.  Pt's wife voiced understanding. m

## 2011-09-03 NOTE — Patient Instructions (Signed)
22 [name]  09/03/2011   Your procedure is scheduled on:  09/06/11 0930am-1100am  Report to [facility] at 0730 AM.  Call this number if you have problems the morning of surgery: [phone]   Remember:   Do not eat food:After Midnight.  May have clear liquids:until Midnight .  Clear liquids include soda, tea, black coffee, apple or grape juice, broth.  Take these medicines the morning of surgery with A SIP OF WATER:    Do not wear jewelry  Do not wear lotions, powders, or perfumes.  .  Do not bring valuables to the hospital.  Contacts, dentures or bridgework may not be worn into surgery.  Leave suitcase in the car. After surgery it may be brought to your room.  For patients admitted to the hospital, checkout time is 11:00 AM the day of discharge.      Special Instructions:2 CHG Shower Use Special Wash: 1/2 bottle night before surgery and 1/2 bottle morning of surgery. Wash chin to toes with CHG. Wash face and private parts with regular soap.    Please read over the following fact sheets that you were given: MRSA Information, Incentive Spirometry Fact Sheet . Coughing and deep breathing exercises, leg exercises

## 2011-09-06 ENCOUNTER — Inpatient Hospital Stay (HOSPITAL_COMMUNITY)
Admission: RE | Admit: 2011-09-06 | Discharge: 2011-09-08 | DRG: 667 | Disposition: A | Discharge status: Home / Self Care | Payer: Medicare Other | Source: Ambulatory Visit | Attending: Urology | Admitting: Urology

## 2011-09-06 ENCOUNTER — Encounter (HOSPITAL_COMMUNITY): Payer: Self-pay | Admitting: *Deleted

## 2011-09-06 ENCOUNTER — Encounter (HOSPITAL_COMMUNITY): Payer: Self-pay | Admitting: Anesthesiology

## 2011-09-06 ENCOUNTER — Other Ambulatory Visit: Payer: Self-pay | Admitting: Urology

## 2011-09-06 ENCOUNTER — Inpatient Hospital Stay (HOSPITAL_COMMUNITY): Payer: Medicare Other | Admitting: Anesthesiology

## 2011-09-06 ENCOUNTER — Encounter (HOSPITAL_COMMUNITY)
Admission: RE | Disposition: A | Discharge status: Home / Self Care | Payer: Self-pay | Source: Ambulatory Visit | Attending: Urology

## 2011-09-06 DIAGNOSIS — C679 Malignant neoplasm of bladder, unspecified: Principal | ICD-10-CM | POA: Diagnosis present

## 2011-09-06 DIAGNOSIS — N400 Enlarged prostate without lower urinary tract symptoms: Secondary | ICD-10-CM | POA: Diagnosis present

## 2011-09-06 DIAGNOSIS — N138 Other obstructive and reflux uropathy: Secondary | ICD-10-CM

## 2011-09-06 DIAGNOSIS — I2510 Atherosclerotic heart disease of native coronary artery without angina pectoris: Secondary | ICD-10-CM | POA: Diagnosis present

## 2011-09-06 DIAGNOSIS — N210 Calculus in bladder: Secondary | ICD-10-CM | POA: Diagnosis present

## 2011-09-06 DIAGNOSIS — I10 Essential (primary) hypertension: Secondary | ICD-10-CM | POA: Diagnosis present

## 2011-09-06 DIAGNOSIS — H3530 Unspecified macular degeneration: Secondary | ICD-10-CM | POA: Diagnosis present

## 2011-09-06 HISTORY — PX: TRANSURETHRAL RESECTION OF PROSTATE: SHX73

## 2011-09-06 HISTORY — PX: TRANSURETHRAL RESECTION OF BLADDER TUMOR: SHX2575

## 2011-09-06 SURGERY — TRANSURETHRAL RESECTION OF THE PROSTATE WITH GYRUS INSTRUMENTS
Anesthesia: General | Site: Bladder | Wound class: Clean Contaminated

## 2011-09-06 MED ORDER — ONDANSETRON HCL 4 MG/2ML IJ SOLN
INTRAMUSCULAR | Status: DC | PRN
  Administered 2011-09-06: 4 mg via INTRAVENOUS

## 2011-09-06 MED ORDER — HYDROCODONE-ACETAMINOPHEN 5-325 MG PO TABS: 1.0000 | ORAL_TABLET | ORAL | Status: DC | PRN

## 2011-09-06 MED ORDER — ACETAMINOPHEN 10 MG/ML IV SOLN
1000.0000 mg | Freq: Four times a day (QID) | INTRAVENOUS | Status: AC
  Administered 2011-09-06 – 2011-09-07 (×4): 1000 mg via INTRAVENOUS
  Filled 2011-09-06 (×4): qty 100

## 2011-09-06 MED ORDER — PROPOFOL 10 MG/ML IV BOLUS
INTRAVENOUS | Status: DC | PRN
  Administered 2011-09-06: 160 mg via INTRAVENOUS

## 2011-09-06 MED ORDER — PROMETHAZINE HCL 25 MG/ML IJ SOLN: 6.2500 mg | INTRAMUSCULAR | Status: DC | PRN

## 2011-09-06 MED ORDER — HYDROCODONE-ACETAMINOPHEN 5-325 MG PO TABS
1.0000 | ORAL_TABLET | Freq: Four times a day (QID) | ORAL | Status: DC | PRN
  Administered 2011-09-07: 1 via ORAL
  Filled 2011-09-06 (×2): qty 1

## 2011-09-06 MED ORDER — ACETAMINOPHEN 10 MG/ML IV SOLN
INTRAVENOUS | Status: DC | PRN
  Administered 2011-09-06: 1000 mg via INTRAVENOUS

## 2011-09-06 MED ORDER — BISACODYL 10 MG RE SUPP: 10.0000 mg | Freq: Every day | RECTAL | Status: DC | PRN

## 2011-09-06 MED ORDER — DIPHENHYDRAMINE HCL 12.5 MG/5ML PO ELIX: 12.5000 mg | ORAL_SOLUTION | Freq: Four times a day (QID) | ORAL | Status: DC | PRN

## 2011-09-06 MED ORDER — LACTATED RINGERS IV SOLN
INTRAVENOUS | Status: DC | PRN
  Administered 2011-09-06: 10:00:00 via INTRAVENOUS

## 2011-09-06 MED ORDER — CIPROFLOXACIN IN D5W 400 MG/200ML IV SOLN: 400.0000 mg | INTRAVENOUS | Status: DC

## 2011-09-06 MED ORDER — HYDROMORPHONE HCL PF 1 MG/ML IJ SOLN: 0.5000 mg | INTRAMUSCULAR | Status: DC | PRN

## 2011-09-06 MED ORDER — LISINOPRIL 5 MG PO TABS
5.0000 mg | ORAL_TABLET | Freq: Every day | ORAL | Status: DC
  Administered 2011-09-07 – 2011-09-08 (×2): 5 mg via ORAL
  Filled 2011-09-06 (×2): qty 1

## 2011-09-06 MED ORDER — FENTANYL CITRATE 0.05 MG/ML IJ SOLN: 25.0000 ug | INTRAMUSCULAR | Status: DC | PRN

## 2011-09-06 MED ORDER — SODIUM CHLORIDE 0.9 % IR SOLN
Status: DC | PRN
  Administered 2011-09-06: 9000 mL

## 2011-09-06 MED ORDER — FENTANYL CITRATE 0.05 MG/ML IJ SOLN
INTRAMUSCULAR | Status: DC | PRN
  Administered 2011-09-06 (×4): 50 ug via INTRAVENOUS

## 2011-09-06 MED ORDER — SIMVASTATIN 5 MG PO TABS
5.0000 mg | ORAL_TABLET | Freq: Every day | ORAL | Status: DC
  Administered 2011-09-06 – 2011-09-07 (×2): 5 mg via ORAL
  Filled 2011-09-06 (×3): qty 1

## 2011-09-06 MED ORDER — CEFAZOLIN SODIUM 1-5 GM-% IV SOLN
INTRAVENOUS | Status: DC | PRN
  Administered 2011-09-06: 2 g via INTRAVENOUS

## 2011-09-06 MED ORDER — CIPROFLOXACIN HCL 500 MG PO TABS
500.0000 mg | ORAL_TABLET | Freq: Two times a day (BID) | ORAL | Status: DC
  Administered 2011-09-06 – 2011-09-08 (×4): 500 mg via ORAL
  Filled 2011-09-06 (×5): qty 1

## 2011-09-06 MED ORDER — DIPHENHYDRAMINE HCL 50 MG/ML IJ SOLN: 12.5000 mg | Freq: Four times a day (QID) | INTRAMUSCULAR | Status: DC | PRN

## 2011-09-06 MED ORDER — LACTATED RINGERS IV SOLN: INTRAVENOUS | Status: DC

## 2011-09-06 MED ORDER — SODIUM CHLORIDE 0.9 % IR SOLN
3000.0000 mL | Status: DC
  Administered 2011-09-06 – 2011-09-07 (×3): 3000 mL

## 2011-09-06 MED ORDER — PANTOPRAZOLE SODIUM 40 MG PO TBEC
40.0000 mg | DELAYED_RELEASE_TABLET | Freq: Every day | ORAL | Status: DC
  Administered 2011-09-07: 40 mg via ORAL
  Filled 2011-09-06 (×3): qty 1

## 2011-09-06 MED ORDER — LIDOCAINE HCL (CARDIAC) 20 MG/ML IV SOLN
INTRAVENOUS | Status: DC | PRN
  Administered 2011-09-06: 80 mg via INTRAVENOUS

## 2011-09-06 MED ORDER — SODIUM CHLORIDE 0.45 % IV SOLN
INTRAVENOUS | Status: DC
  Administered 2011-09-06 – 2011-09-07 (×3): via INTRAVENOUS

## 2011-09-06 MED ORDER — ONDANSETRON HCL 4 MG/2ML IJ SOLN: 4.0000 mg | INTRAMUSCULAR | Status: DC | PRN

## 2011-09-06 MED ORDER — ZOLPIDEM TARTRATE 5 MG PO TABS: 5.0000 mg | ORAL_TABLET | Freq: Every evening | ORAL | Status: DC | PRN

## 2011-09-06 SURGICAL SUPPLY — 33 items
BAG URINE DRAINAGE (UROLOGICAL SUPPLIES) ×1 IMPLANT
BAG URO CATCHER STRL LF (DRAPE) ×2 IMPLANT
BLADE SURG 15 STRL LF DISP TIS (BLADE) IMPLANT
BLADE SURG 15 STRL SS (BLADE)
CATH AINSWORTH 30CC 24FR (CATHETERS) IMPLANT
CATH FOLEY 3WAY 30CC 24FR (CATHETERS)
CATH HEMA 3WAY 30CC 24FR COUDE (CATHETERS) ×1 IMPLANT
CATH URO 16X24FR 3W FL PS (CATHETERS) IMPLANT
CLOTH BEACON ORANGE TIMEOUT ST (SAFETY) ×2 IMPLANT
DRAPE CAMERA CLOSED 9X96 (DRAPES) ×2 IMPLANT
ELECT HF RESECT BIPO 24F 45 ND (CUTTING LOOP) ×1 IMPLANT
ELECT REM PT RETURN 9FT ADLT (ELECTROSURGICAL) ×2
ELECTRODE KNIFE URO 27FR PED (UROLOGICAL SUPPLIES) ×1 IMPLANT
ELECTRODE REM PT RTRN 9FT ADLT (ELECTROSURGICAL) ×1 IMPLANT
GLOVE BIOGEL M STRL SZ7.5 (GLOVE) ×2 IMPLANT
GOWN STRL NON-REIN LRG LVL3 (GOWN DISPOSABLE) ×2 IMPLANT
GOWN STRL REIN XL XLG (GOWN DISPOSABLE) ×2 IMPLANT
HOLDER FOLEY CATH W/STRAP (MISCELLANEOUS) ×1 IMPLANT
KIT ASPIRATION TUBING (SET/KITS/TRAYS/PACK) ×2 IMPLANT
KIT SUPRAPUBIC CATH (MISCELLANEOUS) ×1 IMPLANT
KNIFE COLLINS 24FR (ELECTROSURGICAL) IMPLANT
LOOPS RESECTOSCOPE DISP (ELECTROSURGICAL) ×2 IMPLANT
MANIFOLD NEPTUNE II (INSTRUMENTS) ×2 IMPLANT
MARKER SKIN DUAL TIP RULER LAB (MISCELLANEOUS) ×2 IMPLANT
NDL SPNL 18GX3.5 QUINCKE PK (NEEDLE) IMPLANT
NEEDLE SPNL 18GX3.5 QUINCKE PK (NEEDLE) IMPLANT
NS IRRIG 1000ML POUR BTL (IV SOLUTION) ×2 IMPLANT
PACK CYSTO (CUSTOM PROCEDURE TRAY) ×2 IMPLANT
SCRUB PCMX 4 OZ (MISCELLANEOUS) ×2 IMPLANT
SUT ETHILON 3 0 PS 1 (SUTURE) IMPLANT
SYR 30ML LL (SYRINGE) IMPLANT
SYRINGE IRR TOOMEY STRL 70CC (SYRINGE) ×2 IMPLANT
TUBING CONNECTING 10 (TUBING) ×2 IMPLANT

## 2011-09-06 NOTE — H&P (Signed)
Urology Admission H&P  Chief Complaint: multiple bladder tumors  History of Present Illness: Mr. [name] is a 77 year old male, with history of urinary retention, despite rapid flow. Urodynamics showed the patient had a flow rate of 3 cc per second, with a detrusor pressure of 36 cm water with post with residual foreign 39 cc. Cystoscopy showed multiple bladder cancers. The patient is a very low flow with high pressure. He is now scheduled for TURBT and TURP.  Past Medical History  Diagnosis Date  . Syncope     resolved s/p PPM  . Macular degeneration (senile) of retina   . CAD (coronary artery disease) 09/2008  . Hypertension   . Atrial fib/flutter, transient     short asymptomatic epsiodes by PPM interrogation, declines coumadin  . Foley catheter in place   . Complication of anesthesia     06/12/11 anesthesia made legs asleep also   . PONV (postoperative nausea and vomiting)   . CHF (congestive heart failure)     hx of chf due to fluid overload 9/12   . Sick sinus syndrome     St. Jude  . GERD (gastroesophageal reflux disease)   . Cancer     basal cell carcinoma   Past Surgical History  Procedure Date  . Right inguinal hernia repair 11/19/2002    with mesh  . Umblical hernia 10/28/2009  . Perforated ulcer   . Laparoscopic cholecystectomy w/ cholangiography 06/12/2011  . Cholecystectomy 06/12/11  . Pacemaker insertion     Home Medications:  Prescriptions prior to admission  Medication Sig Dispense Refill  . acetaminophen (TYLENOL) 325 MG tablet Take 650 mg by mouth every 6 (six) hours as needed. Pain       . aspirin 81 MG tablet Take 81 mg by mouth every morning.       [name] HYDROcodone-acetaminophen (NORCO) 5-325 MG per tablet Take 1 tablet by mouth every 6 (six) hours as needed. Pain       . lisinopril (PRINIVIL,ZESTRIL) 5 MG tablet Take 5 mg by mouth every morning.       . Multiple Vitamins-Minerals (ICAPS AREDS FORMULA PO) Take 1 capsule by mouth daily.       [name] omeprazole  (PRILOSEC) 20 MG capsule Take 20 mg by mouth daily.       [name] PRAVASTATIN SODIUM PO Take 80 mg by mouth at bedtime.        Allergies: No Known Allergies  History reviewed. No pertinent family history. Social History:  reports that he quit smoking about 53 years ago. He has never used smokeless tobacco. He reports that he does not drink alcohol or use illicit drugs.  Review of Systems  Constitutional: Negative.   HENT: Negative.   Respiratory: Negative.   Cardiovascular: Negative.   Gastrointestinal: Negative.   Genitourinary: Positive for dysuria, urgency and frequency. Negative for hematuria and flank pain.  Musculoskeletal: Negative.   Skin: Negative.   Neurological: Negative.   Endo/Heme/Allergies: Negative.   Psychiatric/Behavioral: Negative.     Physical Exam:  Vital signs in last 24 hours: Temp:  [96.6 F (35.9 C)] 96.6 F (35.9 C) (12/20 0747) Pulse Rate:  [72] 72  (12/20 0747) Resp:  [18] 18  (12/20 0747) BP: (133)/(84) 133/84 mmHg (12/20 0747) SpO2:  [94 %] 94 % (12/20 0747) Physical Exam  Constitutional: He appears well-developed.  HENT:  Head: Normocephalic.  Eyes: Pupils are equal, round, and reactive to light.  Neck: Normal range of motion.  Cardiovascular: Normal rate.  Respiratory: Effort normal.  GI: Soft.  Genitourinary: Rectum normal, prostate normal and penis normal.  Musculoskeletal: Normal range of motion.  Neurological: He is alert.  Skin: Skin is warm.    Laboratory Data:  No results found for this or any previous visit (from the past 24 hour(s)). Recent Results (from the past 240 hour(s))  SURGICAL PCR SCREEN     Status: Normal   Collection Time   09/03/11  9:23 AM      Component Value Range Status Comment   MRSA, PCR NEGATIVE  NEGATIVE  Final    Staphylococcus aureus NEGATIVE  NEGATIVE  Final    Creatinine:  Basename 09/03/11 0910  CREATININE 0.79   Baseline Creatinine:   Impression/Assessment:  BPH and bladder tumors.   Plan:    TURP and TUR-BT  Keela Rubert I 09/06/2011, 10:21 AM

## 2011-09-06 NOTE — Transfer of Care (Signed)
Immediate Anesthesia Transfer of Care Note  Patient: [name]  Procedure(s) Performed:  TRANSURETHRAL RESECTION OF THE PROSTATE WITH GYRUS INSTRUMENTS - GYRUS LASER; TRANSURETHRAL RESECTION OF BLADDER TUMOR (TURBT)  Patient Location: PACU  Anesthesia Type: General  Level of Consciousness: awake and alert   Airway & Oxygen Therapy: Patient Spontanous Breathing and Patient connected to face mask oxygen  Post-op Assessment: Report given to PACU RN and Post -op Vital signs reviewed and stable  Post vital signs: Reviewed and stable  Complications: No apparent anesthesia complications

## 2011-09-06 NOTE — Anesthesia Postprocedure Evaluation (Signed)
  Anesthesia Post-op Note  Patient: [name]  Procedure(s) Performed:  TRANSURETHRAL RESECTION OF THE PROSTATE WITH GYRUS INSTRUMENTS - GYRUS LASER; TRANSURETHRAL RESECTION OF BLADDER TUMOR (TURBT)  Patient Location: PACU  Anesthesia Type: General  Level of Consciousness: awake and alert   Airway and Oxygen Therapy: Patient Spontanous Breathing  Post-op Pain: mild  Post-op Assessment: Post-op Vital signs reviewed, Patient's Cardiovascular Status Stable, Respiratory Function Stable, Patent Airway and No signs of Nausea or vomiting  Post-op Vital Signs: stable  Complications: No apparent anesthesia complications

## 2011-09-06 NOTE — Op Note (Signed)
Pre-operative diagnosis: BPH, Bladder stone, Bladder cancer  Postoperative diagnosis:Same Operation: Cystoscopy, TURP, cystolitholopaxy, cold cup bladder biopsy.  Surgeon:  Kathie Rhodes. Patsi Sears, MD  Anesthesia: Gen  Preparation: After appropriate preanesthesia, the patient is brought to the operating room, placed in the upper table in the dorsal supine position where general LMA anesthesia was introduced. And replaced in the dorsal lithotomy position with pubis was prepped with Betadine solution draped in usual fashion. IV, was given, timeout was observed.   Review history: The patient is a 77 year old male with significant bladder outlet obstructive symptoms, not improving with medical therapy. Cystoscopy revealed the stone, and possible cancer of the bladder, although this may be second other changes the bladder may be secondary to chronic Foley catheterization. Urodynamics showed a 3 cc flow rate. The patient now for TURP and TUR biopsy bladder, and cystoscopy litholapaxy, to see if he will be able to void postop.  Statement of  Likelihood of Success: Excellent. TIME-OUT observed.:  Procedure: After appropriate preanesthesia the patient is brought to the operative room placed in the upper table in the dorsal supine position. After prepping and draping, the cystoscope was placed the bladder, the patient was noted to have trilobar BPH, with large median lobe. Cystoscopy revealed possible bladder cancer right common this is cold cup biopsied. This may also represent reaction to chronic Foley catheter. Bladder stones identified, and this is broken up with the cystoscope and the resectoscope loop, and irrigated free from the bladder.  Resection of the median lobe was accomplished from the 7:00 to 5:00 position. The lateral lobes were then resected from the 11:00 to the 7:00 position, and from the 1:00 to the 6:00 position. Suture evacuated free from the bladder and sent to laboratory for examination. The  patient tolerated this-year-old. Bleeding was electrocoagulated. At the in the procedure, a 24 three-way Foley catheter was placed (hematuria catheter). Is placed to traction and continuous irrigation.

## 2011-09-06 NOTE — Anesthesia Preprocedure Evaluation (Addendum)
Anesthesia Evaluation  Patient identified by MRN, date of birth, ID band Patient awake    Reviewed: Allergy & Precautions, H&P , NPO status , Patient's Chart, lab work & pertinent test results  History of Anesthesia Complications (+) PONV  Airway Mallampati: II TM Distance: >3 FB Neck ROM: full    Dental  (+) Caps and Dental Advisory Given,    Pulmonary neg pulmonary ROS,  clear to auscultation  Pulmonary exam normal       Cardiovascular Exercise Tolerance: Poor hypertension, + CAD and +CHF + dysrhythmias Atrial Fibrillation + pacemaker regular Normal Sick sinus syndrome.  The A fib is episodic.  They saw it on the pacemaker.  One episode of CHF was after 9/12 lap chole. And due to fluid overload.  No CP.   Neuro/Psych Negative Neurological ROS  Negative Psych ROS   GI/Hepatic negative GI ROS, Neg liver ROS, GERD-  Medicated and Controlled,  Endo/Other  Negative Endocrine ROS  Renal/GU negative Renal ROS  Genitourinary negative   Musculoskeletal   Abdominal   Peds  Hematology negative hematology ROS (+)   Anesthesia Other Findings   Reproductive/Obstetrics negative OB ROS                          Anesthesia Physical Anesthesia Plan  ASA: III  Anesthesia Plan: General   Post-op Pain Management:    Induction: Intravenous  Airway Management Planned: LMA  Additional Equipment:   Intra-op Plan:   Post-operative Plan:   Informed Consent: I have reviewed the patients History and Physical, chart, labs and discussed the procedure including the risks, benefits and alternatives for the proposed anesthesia with the patient or authorized representative who has indicated his/her understanding and acceptance.   Dental Advisory Given  Plan Discussed with: CRNA  Anesthesia Plan Comments:         Anesthesia Quick Evaluation

## 2011-09-07 NOTE — Progress Notes (Signed)
  Subjective: Post op day 1  Objective: Vital signs in last 24 hours: Temp:  [97.5 F (36.4 C)-98.2 F (36.8 C)] 98.2 F (36.8 C) (12/21 0431) Pulse Rate:  [32-63] 63  (12/21 0431) Resp:  [10-21] 18  (12/21 0431) BP: (101-163)/(60-90) 101/69 mmHg (12/21 0431) SpO2:  [94 %-100 %] 98 % (12/21 0431) Weight:  [80.2 kg (176 lb 12.9 oz)] 176 lb 12.9 oz (80.2 kg) (12/20 1625)A  Intake/Output from previous day: 12/20 0701 - 12/21 0700 In: 9722 [P.O.:222; I.V.:2300; IV Piggyback:200] Out: 04540 [Urine:10250; Stool:1] Intake/Output this shift: Total I/O In: -  Out: 1725 [Urine:1725]  Past Medical History  Diagnosis Date  . Syncope     resolved s/p PPM  . Macular degeneration (senile) of retina   . CAD (coronary artery disease) 09/2008  . Hypertension   . Atrial fib/flutter, transient     short asymptomatic epsiodes by PPM interrogation, declines coumadin  . Foley catheter in place   . Complication of anesthesia     06/12/11 anesthesia made legs asleep also   . PONV (postoperative nausea and vomiting)   . CHF (congestive heart failure)     hx of chf due to fluid overload 9/12   . Sick sinus syndrome     St. Jude  . GERD (gastroesophageal reflux disease)   . Cancer     basal cell carcinoma    Physical Exam:  General: urine clear Lungs - Normal respiratory effort, chest expands symmetrically.  Abdomen - Soft, non-tender & non-distended.  Lab Results: No results found for this basename: WBC:3,HGB:3,HCT:3 in the last 72 hours BMET No results found for this basename: NA:2,K:2,CL:2,CO2:2,GLUCOSE:2,BUN:2,CREATININE:2,CALCIUM:2 in the last 72 hours No results found for this basename: LABURIN:1 in the last 72 hours Results for orders placed during the hospital encounter of 09/03/11  SURGICAL PCR SCREEN     Status: Normal   Collection Time   09/03/11  9:23 AM      Component Value Range Status Comment   MRSA, PCR NEGATIVE  NEGATIVE  Final    Staphylococcus aureus NEGATIVE   NEGATIVE  Final     Studies/Results: @RISRSLT24 @  Assessment/Plan:  D/c in AM  Criston Chancellor I 09/07/2011, 10:38 AM

## 2011-09-07 NOTE — Progress Notes (Signed)
Chart reviewed and UR completed. 

## 2011-09-07 NOTE — Discharge Summary (Signed)
Physician Discharge Summary  Patient ID: [name] MRN: [id number] DOB/AGE: 1931/01/06 77 y.o.  Admit date: 09/06/2011 Discharge date: 09/07/2011  Admission Diagnoses:  Discharge Diagnoses:  Active Problems:  * No active hospital problems. *    Discharged Condition: good  Hospital Course: TURP  Significant Diagnostic Studies: Treatments: TURP  Discharge Exam: Blood pressure 101/69, pulse 63, temperature 98.2 F (36.8 C), temperature source Oral, resp. rate 18, height 5\' 10"  (1.778 m), weight 80.2 kg (176 lb 12.9 oz), SpO2 98.00%. General appearance: alert and cooperative  Disposition: Home or Self Care  Discharge Orders    Future Orders Please Complete By Expires   Diet - low sodium heart healthy      Increase activity slowly      Discharge instructions      Comments:   Post transurethral resection of the prostate (TURP) instructions  Your recent prostate surgery requires very special post hospital care. Despite the fact that no skin incisions were used the area around the prostate incision is quite raw and is covered with a scab to promote healing and prevent bleeding. Certain cautions are needed to assure that the scab is not disturbed of the next 2-3 weeks while the healing proceeds.  Because the raw surface in your prostate and the irritating effects of urine you may expect frequency of urination and/or urgency (a stronger desire to urinate) and perhaps even getting up at night more often. This will usually resolve or improve slowly over the healing period. You may see some blood in your urine over the first 6 weeks. Do not be alarmed, even if the urine was clear for a while. Get off your feet and drink lots of fluids until clearing occurs. If you start to pass clots or don't improve call us.  Diet:  You may return to your normal diet immediately. Because of the raw surface of your bladder, alcohol, spicy foods, foods high in acid and drinks with caffeine may cause  irritation or frequency and should be used in moderation. To keep your urine flowing freely and avoid constipation, drink plenty of fluids during the day (8-10 glasses). Tip: Avoid cranberry juice because it is very acidic.  Activity:  Your physical activity doesn't need to be restricted. However, if you are very active, you may see some blood in the urine. We suggest that you reduce your activity under the circumstances until the bleeding has stopped.  Bowels:  It is important to keep your bowels regular during the postoperative period. Straining with bowel movements can cause bleeding. A bowel movement every other day is reasonable. Use a mild laxative if needed, such as milk of magnesia 2-3 tablespoons, or 2 Dulcolax tablets. Call if you continue to have problems. If you had been taking narcotics for pain, before, during or after your surgery, you may be constipated. Take a laxative if necessary.  Medication:  You should resume your pre-surgery medications unless told not to. In addition you may be given an antibiotic to prevent or treat infection. Antibiotics are not always necessary. All medication should be taken as prescribed until the bottles are finished unless you are having an unusual reaction to one of the drugs.     Problems you should report to [place]:  a. Fever greater than 101F. b. Heavy bleeding, or clots (see notes above about blood in urine). c. Inability to urinate. d. Drug reactions (hives, rash, nausea, vomiting, diarrhea). e. Severe burning or pain with urination that is not improving.  Foley catheter - discontinue      Scheduling Instructions:   Saturday  6 AM.     Medication List  As of 09/07/2011 10:48 AM   CONTINUE taking these medications         ICAPS AREDS FORMULA PO      lisinopril 5 MG tablet   Commonly known as: PRINIVIL,ZESTRIL      omeprazole 20 MG capsule   Commonly known as: PRILOSEC      PRAVASTATIN SODIUM PO         STOP taking these  medications         acetaminophen 325 MG tablet      aspirin 81 MG tablet      HYDROcodone-acetaminophen 5-325 MG per tablet             Signed: Jayr Lupercio I 09/07/2011, 10:48 AM

## 2011-09-08 NOTE — Progress Notes (Signed)
2 Days Post-Op Subjective: Patient without complaint. Voiding well. Good flow. Ambulating.   Objective: Vital signs in last 24 hours: Temp:  [97.4 F (36.3 C)-98.1 F (36.7 C)] 98.1 F (36.7 C) (12/22 0452) Pulse Rate:  [55-65] 57  (12/22 0452) Resp:  [16-17] 17  (12/22 0452) BP: (105-138)/(72-75) 138/75 mmHg (12/22 0452) SpO2:  [94 %-96 %] 94 % (12/22 0452)  Intake/Output from previous day: 12/21 0701 - 12/22 0700 In: 2780 [P.O.:480; I.V.:2300] Out: 4098 [Urine:5875; Stool:1] Intake/Output this shift: Total I/O In: 360 [P.O.:360] Out: -   Physical Exam:  Well appearing, NAD.  Walking halls with clothes on asking if he can go home.  Urine racked -- voided 250 cc, good flow, urine pink , no clots.  Abd - bladder soft, non-distended.  Lab Results: No results found for this basename: HGB:3,HCT:3 in the last 72 hours BMET No results found for this basename: NA:2,K:2,CL:2,CO2:2,GLUCOSE:2,BUN:2,CREATININE:2,CALCIUM:2 in the last 72 hours No results found for this basename: LABPT:3,INR:3 in the last 72 hours No results found for this basename: LABURIN:1 in the last 72 hours Results for orders placed during the hospital encounter of 09/03/11  SURGICAL PCR SCREEN     Status: Normal   Collection Time   09/03/11  9:23 AM      Component Value Range Status Comment   MRSA, PCR NEGATIVE  NEGATIVE  Final    Staphylococcus aureus NEGATIVE  NEGATIVE  Final     Studies/Results: No results found.  Assessment/Plan:  D/c home  Voiding well   LOS: 2 days   Antony Haste 09/08/2011, 11:22 AM

## 2011-09-12 ENCOUNTER — Encounter (HOSPITAL_COMMUNITY): Payer: Self-pay | Admitting: Urology

## 2011-11-27 ENCOUNTER — Encounter: Payer: Self-pay | Admitting: Internal Medicine

## 2011-11-27 DIAGNOSIS — I498 Other specified cardiac arrhythmias: Secondary | ICD-10-CM

## 2012-03-06 ENCOUNTER — Encounter: Payer: Self-pay | Admitting: Internal Medicine

## 2012-03-06 DIAGNOSIS — I498 Other specified cardiac arrhythmias: Secondary | ICD-10-CM

## 2012-06-05 DIAGNOSIS — I498 Other specified cardiac arrhythmias: Secondary | ICD-10-CM

## 2012-06-15 ENCOUNTER — Emergency Department (HOSPITAL_COMMUNITY): Payer: Medicare Other

## 2012-06-15 ENCOUNTER — Emergency Department (HOSPITAL_COMMUNITY)
Admission: EM | Admit: 2012-06-15 | Discharge: 2012-06-15 | Disposition: A | Discharge status: Home / Self Care | Payer: Medicare Other | Attending: Emergency Medicine | Admitting: Emergency Medicine

## 2012-06-15 ENCOUNTER — Encounter (HOSPITAL_COMMUNITY): Payer: Self-pay

## 2012-06-15 DIAGNOSIS — K219 Gastro-esophageal reflux disease without esophagitis: Secondary | ICD-10-CM | POA: Insufficient documentation

## 2012-06-15 DIAGNOSIS — Z79899 Other long term (current) drug therapy: Secondary | ICD-10-CM | POA: Insufficient documentation

## 2012-06-15 DIAGNOSIS — I2510 Atherosclerotic heart disease of native coronary artery without angina pectoris: Secondary | ICD-10-CM | POA: Insufficient documentation

## 2012-06-15 DIAGNOSIS — Z7982 Long term (current) use of aspirin: Secondary | ICD-10-CM | POA: Insufficient documentation

## 2012-06-15 DIAGNOSIS — I10 Essential (primary) hypertension: Secondary | ICD-10-CM | POA: Insufficient documentation

## 2012-06-15 DIAGNOSIS — H7290 Unspecified perforation of tympanic membrane, unspecified ear: Secondary | ICD-10-CM | POA: Insufficient documentation

## 2012-06-15 DIAGNOSIS — R42 Dizziness and giddiness: Secondary | ICD-10-CM | POA: Insufficient documentation

## 2012-06-15 DIAGNOSIS — I509 Heart failure, unspecified: Secondary | ICD-10-CM | POA: Insufficient documentation

## 2012-06-15 DIAGNOSIS — R110 Nausea: Secondary | ICD-10-CM | POA: Insufficient documentation

## 2012-06-15 DIAGNOSIS — Z950 Presence of cardiac pacemaker: Secondary | ICD-10-CM | POA: Insufficient documentation

## 2012-06-15 DIAGNOSIS — I4891 Unspecified atrial fibrillation: Secondary | ICD-10-CM | POA: Insufficient documentation

## 2012-06-15 DIAGNOSIS — Z87891 Personal history of nicotine dependence: Secondary | ICD-10-CM | POA: Insufficient documentation

## 2012-06-15 LAB — BASIC METABOLIC PANEL
BUN: 15 mg/dL (ref 6–23)
Chloride: 106 mEq/L (ref 96–112)
Creatinine, Ser: 0.8 mg/dL (ref 0.50–1.35)
GFR calc Af Amer: >90 mL/min (ref >90)
GFR calc non Af Amer: 83 mL/min — ABNORMAL LOW (ref >90)
Potassium: 4.2 mEq/L (ref 3.5–5.1)

## 2012-06-15 LAB — CBC
HCT: 42.2 % (ref 39.0–52.0)
MCHC: 33.6 g/dL (ref 30.0–36.0)
Platelets: 164 10*3/uL (ref 150–400)
RDW: 14.8 % (ref 11.5–15.5)
WBC: 8.9 10*3/uL (ref 4.0–10.5)

## 2012-06-15 LAB — TROPONIN I
Troponin I: <0.3 ng/mL (ref <0.30)
Troponin I: <0.3 ng/mL (ref <0.30)

## 2012-06-15 MED ORDER — ACETAMINOPHEN 325 MG PO TABS: 650.0000 mg | ORAL_TABLET | Freq: Once | ORAL | Status: DC

## 2012-06-15 MED ORDER — SODIUM CHLORIDE 0.9 % IV SOLN: Freq: Once | INTRAVENOUS | Status: DC

## 2012-06-15 MED ORDER — ACETAMINOPHEN 325 MG PO TABS
650.0000 mg | ORAL_TABLET | Freq: Once | ORAL | Status: AC
  Administered 2012-06-15: 650 mg via ORAL
  Filled 2012-06-15: qty 2

## 2012-06-15 MED ORDER — MECLIZINE HCL 25 MG PO TABS
25.0000 mg | ORAL_TABLET | Freq: Once | ORAL | Status: AC
  Administered 2012-06-15: 25 mg via ORAL
  Filled 2012-06-15: qty 1

## 2012-06-15 MED ORDER — MECLIZINE HCL 50 MG PO TABS: 25.0000 mg | ORAL_TABLET | Freq: Three times a day (TID) | ORAL | Status: DC | PRN

## 2012-06-15 NOTE — ED Notes (Signed)
Pt currently in CT.

## 2012-06-15 NOTE — ED Notes (Signed)
Per EMS:  Pt woke up out of his sleep feeling dizzy.  Pt stated he felt, "imbalanced and a tight feeling above his neck".  EMS arrived and ambulated pt to the stretcher - with no abnormal gait.  Stroke screen negative.  Pt attempted several times to vomit with no success.

## 2012-06-15 NOTE — ED Notes (Signed)
Acetaminophen ordered twice.  Once by PA and again by MD.  Cancelled MD order as it was placed after the PA order.

## 2012-06-15 NOTE — ED Provider Notes (Signed)
Medical screening examination/treatment/procedure(s) were conducted as a shared visit with non-physician practitioner(s) and myself.  I personally evaluated the patient during the encounter Pt with sx most suggestive of peripheral vertigo.  Pt with sx most consistent with peripheral vertigo.  No systemic or infectious sx.  Normal neuro exam without weakness, ataxia or cerebellar findings on exam.  Normal vision.  Sx are reproducible with movement of the head and attempting to walk.  After meclizine pt is able to walk without ataxia or dizziness. Will d/c home and f/u with pCP on tues.    Gwyneth Sprout, MD 06/15/12 2027

## 2012-06-15 NOTE — ED Provider Notes (Signed)
History     CSN: [id number]  Arrival date & time 06/15/12  0442   First MD Initiated Contact with Patient 06/15/12 [phone]      Chief Complaint  Patient presents with  . Dizziness    (Consider location/radiation/quality/duration/timing/severity/associated sxs/prior treatment) HPI Hx from pt. [name] is a 78 y.o. male with hx CAD, s/p pacemaker insertion for sick sinus syndrome who presents with dizziness. States he woke up around 3 am to go to the bathroom. He sat up to get out of bed and suddenly felt quite dizzy with this. Sensation of dizziness is described as "the room spinning around me." He has not had this previously. He denies associated chest pain, shortness of breath, vomiting, abdominal pain, numbness or weakness, difficulty speaking, or difficulty walking. He did become nauseated with this and had dry heaves but did not throw up. He denies ringing in his ears associated with this. Dizziness does seem to get worse if he sits up quickly. EMS was called and reports that he was able to ambulate to the stretcher without abnormal gait. Initial stroke screen was negative.  Past Medical History  Diagnosis Date  . Syncope     resolved s/p PPM  . Macular degeneration (senile) of retina   . CAD (coronary artery disease) 09/2008  . Hypertension   . Atrial fib/flutter, transient     short asymptomatic epsiodes by PPM interrogation, declines coumadin  . Foley catheter in place   . Complication of anesthesia     06/12/11 anesthesia made legs asleep also   . PONV (postoperative nausea and vomiting)   . CHF (congestive heart failure)     hx of chf due to fluid overload 9/12   . Sick sinus syndrome     St. Jude  . GERD (gastroesophageal reflux disease)   . Cancer     basal cell carcinoma    Past Surgical History  Procedure Date  . Right inguinal hernia repair 11/19/2002    with mesh  . Umblical hernia 10/28/2009  . Perforated ulcer   . Laparoscopic cholecystectomy w/  cholangiography 06/12/2011  . Cholecystectomy 06/12/11  . Pacemaker insertion   . Transurethral resection of prostate 09/06/2011    Procedure: TRANSURETHRAL RESECTION OF THE PROSTATE WITH GYRUS INSTRUMENTS;  Surgeon: Kathi Ludwig, MD;  Location: [facility];  Service: Urology;  Laterality: N/A;  GYRUS LASER  . Transurethral resection of bladder tumor 09/06/2011    Procedure: TRANSURETHRAL RESECTION OF BLADDER TUMOR (TURBT);  Surgeon: Kathi Ludwig, MD;  Location: [facility];  Service: Urology;  Laterality: N/A;    No family history on file.  History  Substance Use Topics  . Smoking status: Former Smoker    Quit date: 09/17/1958  . Smokeless tobacco: Never Used   Comment: quit 1968  . Alcohol Use: No      Review of Systems  Constitutional: Negative for fever and chills.  HENT: Negative for hearing loss, congestion, neck pain and tinnitus.   Eyes: Negative for photophobia and visual disturbance.  Respiratory: Negative for shortness of breath.   Cardiovascular: Negative for chest pain.  Gastrointestinal: Positive for nausea. Negative for vomiting and abdominal pain.  Genitourinary: Negative.   Musculoskeletal: Negative for myalgias.  Skin: Negative for rash.  Neurological: Positive for dizziness. Negative for syncope, facial asymmetry, speech difficulty, weakness and numbness.  Hematological: Negative.   Psychiatric/Behavioral: Negative for confusion.    Allergies  Review of patient's allergies indicates no known allergies.  Home Medications  Current Outpatient Rx  Name Route Sig Dispense Refill  . ASPIRIN EC 81 MG PO TBEC Oral Take 81 mg by mouth daily.    . ICAPS AREDS FORMULA PO Oral Take 1 capsule by mouth daily.     [name] OMEPRAZOLE 20 MG PO CPDR Oral Take 20 mg by mouth daily.     [name] PRAVASTATIN SODIUM PO Oral Take 80 mg by mouth at bedtime.     [name] PRESCRIPTION MEDICATION Injection Inject 1 vial as directed every 30 (thirty) days. To right eye for macular  degeneration given by Dr.      BP 122/71  Pulse 75  Temp 97.9 F (36.6 C) (Oral)  Resp 14  SpO2 94%  Physical Exam  Nursing note and vitals reviewed. Constitutional: He appears well-developed and well-nourished. No distress.  HENT:  Head: Normocephalic and atraumatic.  Right Ear: External ear normal.  Left Ear: External ear normal.  Mouth/Throat: Oropharynx is clear and moist. No oropharyngeal exudate.       Perforation of R TM noted, L TM nl  Eyes: EOM are normal. Pupils are equal, round, and reactive to light.       No sig nystagmus seen on EOMs  Neck: Normal range of motion. Neck supple.  Cardiovascular: Normal rate, regular rhythm and normal heart sounds.   Pulmonary/Chest: Effort normal and breath sounds normal. He exhibits no tenderness.  Abdominal: Soft. Bowel sounds are normal. There is no tenderness. There is no rebound and no guarding.  Musculoskeletal: Normal range of motion.  Lymphadenopathy:    He has no cervical adenopathy.  Neurological: He is alert.       Speech clear, pupils equal round reactive to light, extraocular movements intact   Normal peripheral visual fields Cranial nerves III through XII normal including no facial droop Follows commands, moves all extremities x4, normal strength to bilateral upper and lower extremities at all major muscle groups including grip Sensation normal to light touch Coordination intact, no limb ataxia, finger-nose-finger normal Rapid alternating movements normal No pronator drift Gait normal  Skin: Skin is warm and dry. He is not diaphoretic.  Psychiatric: He has a normal mood and affect.    ED Course  Procedures (including critical care time)  Date: 06/15/2012  Rate: 70  Rhythm: paced rhythm  QRS Axis: normal  Intervals: n/a  ST/T Wave abnormalities: wide complex repolarization c/w paced rhythm  Conduction Disutrbances:none  Narrative Interpretation:   Old EKG Reviewed: as compared with Sept 27, 2012 rate  decreased; new pacing today  Labs Reviewed  BASIC METABOLIC PANEL - Abnormal; Notable for the following:    Glucose, Bld 119 (*)     GFR calc non Af Amer 83 (*)     All other components within normal limits  TROPONIN I  CBC   Dg Chest 2 View  06/15/2012  *RADIOLOGY REPORT*  Clinical Data: Dizziness, weakness  CHEST - 2 VIEW  Comparison: 12/13/2011  Findings: Chronic interstitial markings.  No frank interstitial edema. No pleural effusion or pneumothorax.  Stable right lateral pleural thickening.  The heart is top normal in size.  Left subclavian pacemaker.  Degenerative changes of the visualized thoracolumbar spine.  Stable mild wedging of a mid thoracic vertebral body. Old right lateral rib fracture deformity.  IMPRESSION: No evidence of acute cardiopulmonary disease.  Chronic interstitial markings.  No frank interstitial edema.   Original Report Authenticated By: Charline Bills, M.D.    Ct Head Wo Contrast  06/15/2012  *RADIOLOGY REPORT*  Clinical  Data: Dizziness  CT HEAD WITHOUT CONTRAST  Technique:  Contiguous axial images were obtained from the base of the skull through the vertex without contrast.  Comparison: 09/26/2008  Findings: No evidence of parenchymal hemorrhage or extra-axial fluid collection. No mass lesion, mass effect, or midline shift.  No CT evidence of acute infarction.  Extensive Subcortical white matter and periventricular small vessel ischemic changes.  Intracranial atherosclerosis.  Mild global cortical atrophy.  No ventriculomegaly.  The visualized paranasal sinuses are essentially clear. The mastoid air cells are unopacified.  No evidence of calvarial fracture.  IMPRESSION: No evidence of acute intracranial abnormality.  Atrophy with small vessel ischemic changes and intracranial atherosclerosis.   Original Report Authenticated By: Charline Bills, M.D.      1. Vertigo       MDM  Pt presents with complaint of dizziness which started when he woke up this am.  Associated with a brief sensation of diaphoresis, now resolved. He has no neurologic/cerebellar findings on exam, but does have a perforation of the R TM noted on HEENT exam (he states this is old), which may favor peripheral etiology. Cardiology workup including trop x 2 negative. Pt's head CT negative. He is unable to obtain MRI 2/2 pacer. Pt was treated with meclizine and reported complete resolution of his sx with this. He was able to ambulate without difficulty. He was monitored for several hours. Feel he is stable for discharge home at this time. Will rx meclizine for sx relief. He has an appt upcoming with his PCP on Tues which he was urged to keep. Reasons to return to the ED discussed. He and his wife verbalized understanding and were agreeable with plan.        Grant Fontana, PA-C 06/15/12 1421

## 2012-06-15 NOTE — ED Notes (Signed)
Ambulated the patient, he states that the dizziness is better.

## 2012-07-09 ENCOUNTER — Encounter: Payer: Self-pay | Admitting: *Deleted

## 2012-07-09 DIAGNOSIS — Z950 Presence of cardiac pacemaker: Secondary | ICD-10-CM | POA: Insufficient documentation

## 2012-07-16 ENCOUNTER — Encounter: Payer: Self-pay | Admitting: Internal Medicine

## 2012-07-16 ENCOUNTER — Other Ambulatory Visit: Payer: Self-pay | Admitting: Internal Medicine

## 2012-07-16 ENCOUNTER — Ambulatory Visit (INDEPENDENT_AMBULATORY_CARE_PROVIDER_SITE_OTHER): Payer: Medicare Other | Admitting: Internal Medicine

## 2012-07-16 VITALS — BP 140/89 | HR 69 | Ht 70.0 in | Wt 188.0 lb

## 2012-07-16 DIAGNOSIS — I4891 Unspecified atrial fibrillation: Secondary | ICD-10-CM

## 2012-07-16 DIAGNOSIS — I10 Essential (primary) hypertension: Secondary | ICD-10-CM

## 2012-07-16 DIAGNOSIS — I495 Sick sinus syndrome: Secondary | ICD-10-CM

## 2012-07-16 DIAGNOSIS — Z950 Presence of cardiac pacemaker: Secondary | ICD-10-CM

## 2012-07-16 LAB — CBC WITH DIFFERENTIAL/PLATELET
Basophils Absolute: 0 10*3/uL (ref 0.0–0.1)
Lymphocytes Relative: 25.8 % (ref 12.0–46.0)
Lymphs Abs: 2.1 10*3/uL (ref 0.7–4.0)
Monocytes Relative: 10.5 % (ref 3.0–12.0)
Neutrophils Relative %: 60.1 % (ref 43.0–77.0)
Platelets: 198 10*3/uL (ref 150.0–400.0)
RDW: 14.7 % — ABNORMAL HIGH (ref 11.5–14.6)

## 2012-07-16 LAB — PACEMAKER DEVICE OBSERVATION
AL AMPLITUDE: 5 mv
BAMS-0001: 150 {beats}/min
BATTERY VOLTAGE: 2.74 V
DEVICE MODEL PM: 1284954
RV LEAD AMPLITUDE: 6 mv
RV LEAD IMPEDENCE PM: 370 Ohm
VENTRICULAR PACING PM: 14

## 2012-07-16 LAB — BASIC METABOLIC PANEL
BUN: 18 mg/dL (ref 6–23)
Calcium: 8.9 mg/dL (ref 8.4–10.5)
Creatinine, Ser: 0.9 mg/dL (ref 0.4–1.5)
GFR: 84.22 mL/min (ref >60.00)
Glucose, Bld: 90 mg/dL (ref 70–99)

## 2012-07-16 MED ORDER — APIXABAN 5 MG PO TABS: 5.0000 mg | ORAL_TABLET | Freq: Two times a day (BID) | ORAL | Status: DC

## 2012-07-16 NOTE — Progress Notes (Signed)
PCP: Michiel Sites, MD  [name] [name] is a 78 y.o. male who presents today for routine electrophysiology followup.  Since last being seen in our clinic, the patient reports doing very well.  He remains [place].  He has occasional dypsnea with moderate activity.  Today, he denies symptoms of palpitations, chest pain, shortness of breath at rest,  lower extremity edema, dizziness, presyncope, or syncope.  The patient is otherwise without complaint today.   Past Medical History  Diagnosis Date  . Syncope     resolved s/p PPM  . Macular degeneration (senile) of retina   . CAD (coronary artery disease) 09/2008  . Hypertension   . Persistent atrial fibrillation   . Complication of anesthesia     06/12/11 anesthesia made legs asleep also   . PONV (postoperative nausea and vomiting)   . Diastolic dysfunction     hx of chf due to fluid overload 9/12   . Sick sinus syndrome     St. Jude  . GERD (gastroesophageal reflux disease)   . Cancer     basal cell carcinoma   Past Surgical History  Procedure Date  . Right inguinal hernia repair 11/19/2002    with mesh  . Umblical hernia 10/28/2009  . Perforated ulcer   . Laparoscopic cholecystectomy w/ cholangiography 06/12/2011  . Cholecystectomy 06/12/11  . Pacemaker insertion 09/28/08    SJM [facility] XL DR  . Transurethral resection of prostate 09/06/2011  . Transurethral resection of bladder tumor 09/06/2011    Procedure: TRANSURETHRAL RESECTION OF BLADDER TUMOR (TURBT);  Surgeon: Kathi Ludwig, MD;  Location: [facility];  Service: Urology;  Laterality: N/A;    Current Outpatient Prescriptions  Medication Sig Dispense Refill  . aspirin EC 81 MG tablet Take 81 mg by mouth daily.      . meclizine (ANTIVERT) 50 MG tablet Take 0.5 tablets (25 mg total) by mouth 3 (three) times daily as needed for dizziness.  30 tablet  0  . Multiple Vitamins-Minerals (ICAPS AREDS FORMULA PO) Take 1 capsule by mouth daily.       [name] omeprazole  (PRILOSEC) 20 MG capsule Take 20 mg by mouth daily.       [name] PRESCRIPTION MEDICATION Inject 1 vial as directed every 30 (thirty) days. To right eye for macular degeneration given by Dr.        Physical Exam: Filed Vitals:   07/16/12 0859  BP: 140/89  Pulse: 69  Height: 5\' 10"  (1.778 m)  Weight: 188 lb (85.276 kg)    GEN- The patient is well appearing, alert and oriented x 3 today.   Head- normocephalic, atraumatic Eyes-  Sclera clear, conjunctiva pink Ears- hearing intact Oropharynx- clear Lungs- Clear to ausculation bilaterally, normal work of breathing Chest- pacemaker pocket is well healed Heart- irregular rate and rhythm, no murmurs, rubs or gallops, PMI not laterally displaced GI- soft, NT, ND, + BS Extremities- no clubbing, cyanosis, or edema  Pacemaker interrogation- reviewed in detail today,  See PACEART report  Assessment and Plan:  1. Bradycardia Normal pacemaker function See [facility] [employer] report Device reprogrammed to DDIR today to promote battery longevity.  Consider VVIR if he remains in afib upon return  2. Persistent afib Minimally symptomatic I have recommended anticoagulation He is willing to start eliquis. He should stop ASA Refer him to the anticoagulation clinic in 4 weeks for follow-up.  I have spoken with his Opthomologist who will monitor closely for worsening a retinal hemorrhage with macular  degeneration on eliquis. Echo to evaluate for structural changes with afib.  3. Borderline HTN Repeat BP by MD is 120/84  No changes  Return in 6 months

## 2012-07-16 NOTE — Patient Instructions (Addendum)
Your physician wants you to follow-up in: 6 months with  Dr Johney Frame.  You will receive a reminder letter in the mail two months in advance. If you don't receive a letter, please call our office to schedule the follow-up appointment   Your physician recommends that you schedule a follow-up appointment in: 4 weeks with Kennon Rounds in Coumadin clinic for anti-coag follow up    Your physician has requested that you have an echocardiogram. Echocardiography is a painless test that uses sound waves to create images of your heart. It provides your doctor with information about the size and shape of your heart and how well your heart's chambers and valves are working. This procedure takes approximately one hour. There are no restrictions for this procedure.  Your physician has recommended you make the following change in your medication:  1) Start Eliquis 5mg  twice daliy 2) Stop Aspirin

## 2012-07-23 ENCOUNTER — Ambulatory Visit (HOSPITAL_COMMUNITY): Payer: Medicare Other | Attending: Cardiovascular Disease | Admitting: Radiology

## 2012-07-23 DIAGNOSIS — I2510 Atherosclerotic heart disease of native coronary artery without angina pectoris: Secondary | ICD-10-CM | POA: Insufficient documentation

## 2012-07-23 DIAGNOSIS — I059 Rheumatic mitral valve disease, unspecified: Secondary | ICD-10-CM | POA: Insufficient documentation

## 2012-07-23 DIAGNOSIS — I369 Nonrheumatic tricuspid valve disorder, unspecified: Secondary | ICD-10-CM | POA: Insufficient documentation

## 2012-07-23 DIAGNOSIS — I379 Nonrheumatic pulmonary valve disorder, unspecified: Secondary | ICD-10-CM | POA: Insufficient documentation

## 2012-07-23 DIAGNOSIS — I10 Essential (primary) hypertension: Secondary | ICD-10-CM | POA: Insufficient documentation

## 2012-07-23 DIAGNOSIS — I4891 Unspecified atrial fibrillation: Secondary | ICD-10-CM | POA: Insufficient documentation

## 2012-07-23 NOTE — Progress Notes (Signed)
Echocardiogram performed.  

## 2012-08-15 ENCOUNTER — Ambulatory Visit (INDEPENDENT_AMBULATORY_CARE_PROVIDER_SITE_OTHER): Payer: Medicare Other

## 2012-08-15 DIAGNOSIS — I4891 Unspecified atrial fibrillation: Secondary | ICD-10-CM

## 2012-08-15 NOTE — Patient Instructions (Addendum)
Pt was started on Eliquis for atrial fibrillation on 07/16/12 by Dr Johney Frame.  Reviewed patients medication list.  Pt is not currently on any combined P-gp and strong CYP3A4 inhibitors/inducers (ketoconazole, traconazole, ritonavir, carbamazepine, phenytoin, rifampin, St. John's wort).  Reviewed labs.  SCr 0.9, Weight 188 lbs, CrCl-80 .  Dose is appropriate based on CrCl.   Hgb and HCT 15.0/45.5.  A full discussion of the nature of anticoagulants has been carried out.  A benefit/risk analysis has been presented to the patient, so that they understand the justification for choosing anticoagulation with Eliquis at this time.  The need for compliance is stressed.  Pt is aware to take the medication twice daily.  Side effects of potential bleeding are discussed, including unusual colored urine or stools, coughing up blood or coffee ground emesis, nose bleeds or serious fall or head trauma.  Discussed signs and symptoms of stroke. The patient should avoid any OTC items containing aspirin or ibuprofen.  Avoid alcohol consumption.   Call if any signs of abnormal bleeding.  Discussed financial obligations and resolved any difficulty in obtaining medication.  Next lab test test in 6 months.

## 2012-09-05 DIAGNOSIS — I498 Other specified cardiac arrhythmias: Secondary | ICD-10-CM

## 2012-10-16 ENCOUNTER — Telehealth: Payer: Self-pay | Admitting: *Deleted

## 2012-10-16 NOTE — Telephone Encounter (Signed)
Patient called to say he was having trouble with rapid heart beat and overall weakness. Patient stated he started Eilquis the end of October and noticed that he felt poorly after starting medication. Patient has been off medication the last 10 days . Patient states that he felt better off the Eliquis. I called Dennis Bast and we set patient up appointment with Dr. Johney Frame on 10/24/12 at 2:45pm. I explained to patient he has greater risk of stroke being off Eliquis. I explained that he is probably having atrial fib and we will need to check his pacemaker. I told there are other anticoagulants that we can try. I asked him if he noticed any triggers for rapid heart rate. We told me usually after working.I told him to take it easy until sees Dr. Johney Frame and recommend taking Eliquis until sees Dr. Johney Frame. Patient verbalized understanding.

## 2012-10-24 ENCOUNTER — Ambulatory Visit (INDEPENDENT_AMBULATORY_CARE_PROVIDER_SITE_OTHER): Payer: Medicare Other | Admitting: Internal Medicine

## 2012-10-24 ENCOUNTER — Encounter: Payer: Self-pay | Admitting: Internal Medicine

## 2012-10-24 VITALS — BP 137/87 | HR 54 | Ht 69.0 in | Wt 183.2 lb

## 2012-10-24 DIAGNOSIS — Z950 Presence of cardiac pacemaker: Secondary | ICD-10-CM

## 2012-10-24 DIAGNOSIS — I4891 Unspecified atrial fibrillation: Secondary | ICD-10-CM

## 2012-10-24 DIAGNOSIS — I10 Essential (primary) hypertension: Secondary | ICD-10-CM

## 2012-10-24 DIAGNOSIS — I495 Sick sinus syndrome: Secondary | ICD-10-CM

## 2012-10-24 LAB — PACEMAKER DEVICE OBSERVATION
AL IMPEDENCE PM: 518 Ohm
BAMS-0001: 150 {beats}/min
BRDY-0004RV: 105 {beats}/min
DEVICE MODEL PM: 1284954
RV LEAD AMPLITUDE: 7.3 mv
RV LEAD THRESHOLD: 0.5 V

## 2012-10-24 NOTE — Patient Instructions (Addendum)
Your physician wants you to follow-up in: 12 months with Dr Jacquiline Doe will receive a reminder letter in the mail two months in advance. If you don't receive a letter, please call our office to schedule the follow-up appointment.   [facility] every 3 months

## 2012-10-26 NOTE — Progress Notes (Signed)
PCP: Michiel Sites, MD  [name] [name] is a 79 y.o. male who presents today for routine electrophysiology followup.  Since last being seen in our clinic, the patient reports doing very well. Today, he denies symptoms of chest pain, shortness of breath at rest,  lower extremity edema, dizziness, presyncope, or syncope.  He recently had palpitations which he attributed to eliquis.  These have resolved and he has restarted eliquis.  He has occasional SOB with exertion which is unchanged.   He denies bleeding. The patient is otherwise without complaint today.   Past Medical History  Diagnosis Date  . Syncope     resolved s/p PPM  . Macular degeneration (senile) of retina   . CAD (coronary artery disease) 09/2008  . Hypertension   . Persistent atrial fibrillation   . Complication of anesthesia     06/12/11 anesthesia made legs asleep also   . PONV (postoperative nausea and vomiting)   . Diastolic dysfunction     hx of chf due to fluid overload 9/12   . Sick sinus syndrome     St. Jude  . GERD (gastroesophageal reflux disease)   . Cancer     basal cell carcinoma   Past Surgical History  Procedure Laterality Date  . Right inguinal hernia repair  11/19/2002    with mesh  . Umblical hernia  10/28/2009  . Perforated ulcer    . Laparoscopic cholecystectomy w/ cholangiography  06/12/2011  . Cholecystectomy  06/12/11  . Pacemaker insertion  09/28/08    SJM Zephyr XL DR  . Transurethral resection of prostate  09/06/2011  . Transurethral resection of bladder tumor  09/06/2011    Procedure: TRANSURETHRAL RESECTION OF BLADDER TUMOR (TURBT);  Surgeon: Kathi Ludwig, MD;  Location: [facility];  Service: Urology;  Laterality: N/A;    Current Outpatient Prescriptions  Medication Sig Dispense Refill  . apixaban (ELIQUIS) 5 MG TABS tablet Take 1 tablet (5 mg total) by mouth 2 (two) times daily.  60 tablet  3  . meclizine (ANTIVERT) 50 MG tablet Take 0.5 tablets (25 mg total) by mouth 3 (three)  times daily as needed for dizziness.  30 tablet  0  . Multiple Vitamins-Minerals (ICAPS AREDS FORMULA PO) Take 1 capsule by mouth daily.       [name] omeprazole (PRILOSEC) 20 MG capsule Take 20 mg by mouth daily.       [name] PRESCRIPTION MEDICATION Inject 1 vial as directed every 30 (thirty) days. To right eye for macular degeneration given by Dr.       No current facility-administered medications for this visit.    Physical Exam: Filed Vitals:   10/24/12 1503  BP: 137/87  Pulse: 54  Height: 5\' 9"  (1.753 m)  Weight: 183 lb 3.2 oz (83.099 kg)    GEN- The patient is well appearing, alert and oriented x 3 today.   Head- normocephalic, atraumatic Eyes-  Sclera clear, conjunctiva pink Ears- hearing intact Oropharynx- clear Lungs- Clear to ausculation bilaterally, normal work of breathing Chest- pacemaker pocket is well healed Heart- irregular rate and rhythm, no murmurs, rubs or gallops, PMI not laterally displaced GI- soft, NT, ND, + BS Extremities- no clubbing, cyanosis, or edema  Pacemaker interrogation- reviewed in detail today,  See PACEART report  Assessment and Plan:  1. Bradycardia Normal pacemaker function See Arita Miss Art report Device reprogrammed to DDIR today to promote battery longevity.  Consider VVIR if he remains in afib upon return  2. Persistent afib Minimally symptomatic  He appears to be tolerating eliquis without difficulty at this point He declines dlitiazem for palpitations, even prn  3. Borderline HTN He does not wish for medicine change  Return in 12 months

## 2012-11-03 ENCOUNTER — Encounter: Payer: Self-pay | Admitting: Internal Medicine

## 2012-12-16 ENCOUNTER — Other Ambulatory Visit: Payer: Self-pay | Admitting: *Deleted

## 2012-12-16 DIAGNOSIS — I4891 Unspecified atrial fibrillation: Secondary | ICD-10-CM

## 2012-12-16 MED ORDER — APIXABAN 5 MG PO TABS: 5.0000 mg | ORAL_TABLET | Freq: Two times a day (BID) | ORAL | Status: DC

## 2013-01-14 ENCOUNTER — Telehealth: Payer: Self-pay | Admitting: Internal Medicine

## 2013-01-14 NOTE — Telephone Encounter (Signed)
New problem   Pt is concern about him not seeing Dr Johney Frame again til 10/2013. He want to know does he need an appt to see Dr Johney Frame concerning his Eliquis. Please call pt

## 2013-01-14 NOTE — Telephone Encounter (Signed)
Explained to him he will receive a letter in the mail in approximately 10 months to schedule the 12 months follow up

## 2013-01-26 DIAGNOSIS — I498 Other specified cardiac arrhythmias: Secondary | ICD-10-CM

## 2013-02-02 ENCOUNTER — Ambulatory Visit (INDEPENDENT_AMBULATORY_CARE_PROVIDER_SITE_OTHER): Payer: Medicare Other | Admitting: *Deleted

## 2013-02-02 DIAGNOSIS — I4891 Unspecified atrial fibrillation: Secondary | ICD-10-CM

## 2013-02-02 LAB — BASIC METABOLIC PANEL
Calcium: 9 mg/dL (ref 8.4–10.5)
GFR: 73.83 mL/min (ref >60.00)
Glucose, Bld: 104 mg/dL — ABNORMAL HIGH (ref 70–99)
Potassium: 4.5 mEq/L (ref 3.5–5.1)

## 2013-02-02 LAB — CBC
Hemoglobin: 15.3 g/dL (ref 13.0–17.0)
MCHC: 33.6 g/dL (ref 30.0–36.0)
Platelets: 178 10*3/uL (ref 150.0–400.0)
WBC: 7.4 10*3/uL (ref 4.5–10.5)

## 2013-02-02 NOTE — Patient Instructions (Signed)

## 2013-02-02 NOTE — Progress Notes (Signed)
Pt was started on Eliquis for Afib on 07/16/12.    Reviewed patients medication list.  Pt is not  currently on any combined P-gp and strong CYP3A4 inhibitors/inducers (ketoconazole, traconazole, ritonavir, carbamazepine, phenytoin, rifampin, St. John's wort).  Reviewed labs.  SCr 1.0 , Weight 83 Kg, CrCl- 69.2  Dose is appropriate.   Hgb and HCT 15.3/45.4.   A full discussion of the nature of anticoagulants has been carried out.  A benefit/risk analysis has been presented to the patient, so that they understand the justification for choosing anticoagulation with Eliquis at this time.  The need for compliance is stressed.  Pt is aware to take the medication twice daily.  Side effects of potential bleeding are discussed, including unusual colored urine or stools, coughing up blood or coffee ground emesis, nose bleeds or serious fall or head trauma.  Discussed signs and symptoms of stroke. The patient should avoid any OTC items containing aspirin or ibuprofen.  Avoid alcohol consumption.   Call if any signs of abnormal bleeding.  Discussed financial obligations and resolved any difficulty in obtaining medication.  Next lab test test in 6 months.

## 2013-02-19 ENCOUNTER — Encounter: Payer: Self-pay | Admitting: Internal Medicine

## 2013-03-04 ENCOUNTER — Encounter: Payer: Self-pay | Admitting: Internal Medicine

## 2013-04-23 IMAGING — NM NM HEPATOBILIARY IMAGE, INC GB
3 series · 18 of 18 positions shown · non-contrast
Comparison: Abdominal ultrasound same date.

CLINICAL DATA: Abdominal pain.  Gallstones seen on the ultrasound.

NUCLEAR MEDICINE HEPATOBILIARY IMAGING WITH GALLBLADDER EF
TECHNIQUE: Sequential images of the abdomen were obtained [DATE] minutes following intravenous administration of
radiopharmaceutical.  After slow intravenous infusion of
micrograms Cholecystokinin, gallbladder ejection fraction was
determined.
Radiopharmaceutical:  5.0 mCi Oc-WWm Choletec

[he hepatobiliary · 3.43mm/px · 6 of 6 frames shown (1 of 3)]
[frame 1/6]
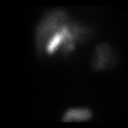
[frame 2/6]
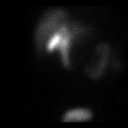
[frame 3/6]
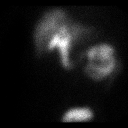
[frame 4/6]
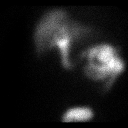
[frame 5/6]
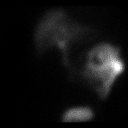
[frame 6/6]
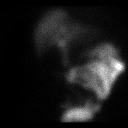

[he hepatobiliary · 3.43mm/px · 6 of 22 frames shown (2 of 3)]
[frame 2/22]
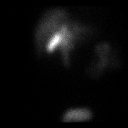
[frame 6/22]
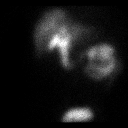
[frame 10/22]
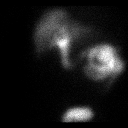
[frame 13/22]
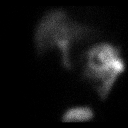
[frame 17/22]
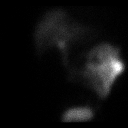
[frame 21/22]
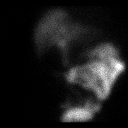

[he hepatobiliary · 3.43mm/px · 6 of 59 frames shown (3 of 3)]
[frame 5/59]
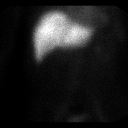
[frame 15/59]
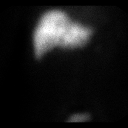
[frame 25/59]
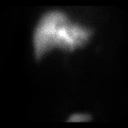
[frame 35/59]
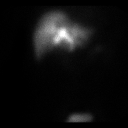
[frame 45/59]
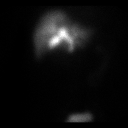
[frame 55/59]
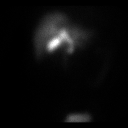

[18 of 18 positions shown; findings below may reference images not displayed]

FINDINGS: There is symmetric uptake in the liver and prompt
excretion into the biliary tree which is visualized by 15 minutes.
The gallbladder is visualized at 00minutes.  Activity is noted in
the small bowel at 30 minutes.

The patient received a protocoled infusion of CCK and a gallbladder
ejection fraction was estimated at 86%.  Normal is greater than
30%.

The patient did not experienced symptoms during CCK infusion.
IMPRESSION: Normal biliary patency study and gallbladder ejection fraction.

## 2013-04-30 ENCOUNTER — Encounter: Payer: Self-pay | Admitting: Internal Medicine

## 2013-04-30 DIAGNOSIS — I498 Other specified cardiac arrhythmias: Secondary | ICD-10-CM

## 2013-05-26 ENCOUNTER — Encounter: Payer: Self-pay | Admitting: Internal Medicine

## 2013-05-28 ENCOUNTER — Telehealth: Payer: Self-pay | Admitting: *Deleted

## 2013-05-28 NOTE — Telephone Encounter (Signed)
eliquis approved until 05/27/2014

## 2013-07-30 ENCOUNTER — Encounter: Payer: Self-pay | Admitting: Internal Medicine

## 2013-07-30 DIAGNOSIS — I498 Other specified cardiac arrhythmias: Secondary | ICD-10-CM

## 2013-08-10 ENCOUNTER — Ambulatory Visit (INDEPENDENT_AMBULATORY_CARE_PROVIDER_SITE_OTHER): Payer: Medicare Other | Admitting: *Deleted

## 2013-08-10 DIAGNOSIS — Z7901 Long term (current) use of anticoagulants: Secondary | ICD-10-CM

## 2013-08-10 DIAGNOSIS — I4891 Unspecified atrial fibrillation: Secondary | ICD-10-CM

## 2013-08-10 DIAGNOSIS — Z5181 Encounter for therapeutic drug level monitoring: Secondary | ICD-10-CM

## 2013-08-10 NOTE — Patient Instructions (Signed)

## 2013-08-11 LAB — BASIC METABOLIC PANEL
BUN: 20 mg/dL (ref 6–23)
Calcium: 8.7 mg/dL (ref 8.4–10.5)
Chloride: 107 mEq/L (ref 96–112)
Creatinine, Ser: 1 mg/dL (ref 0.4–1.5)
Potassium: 4.2 mEq/L (ref 3.5–5.1)

## 2013-08-11 LAB — CBC
Hemoglobin: 12.9 g/dL — ABNORMAL LOW (ref 13.0–17.0)
MCHC: 33.7 g/dL (ref 30.0–36.0)
RDW: 12.9 % (ref 11.5–14.6)

## 2013-08-12 NOTE — Progress Notes (Signed)
Pt was started on Eliquis for Afib on 07/16/12.    Reviewed patients medication list.  Pt is not  currently on any combined P-gp and strong CYP3A4 inhibitors/inducers (ketoconazole, traconazole, ritonavir, carbamazepine, phenytoin, rifampin, St. John's wort).  Reviewed labs.  SCr 1.0 and  Weight 80.8Kg,  Dose appropriate based on weight and Scr.  Hgb and HCT 12.9 & 38.2 .  A full discussion of the nature of anticoagulants has been carried out.  A benefit/risk analysis has been presented to the patient, so that they understand the justification for choosing anticoagulation with Eliquis at this time.  The need for compliance is stressed.  Pt is aware to take the medication twice daily.  Side effects of potential bleeding are discussed, including unusual colored urine or stools, coughing up blood or coffee ground emesis, nose bleeds or serious fall or head trauma.  Discussed signs and symptoms of stroke. The patient should avoid any OTC items containing aspirin or ibuprofen.  Avoid alcohol consumption.   Call if any signs of abnormal bleeding.  Discussed financial obligations and resolved any difficulty in obtaining medication.  Next lab test test in 3 months since labs have changed.

## 2013-10-18 ENCOUNTER — Other Ambulatory Visit: Payer: Self-pay | Admitting: Internal Medicine

## 2013-10-30 ENCOUNTER — Encounter: Payer: Self-pay | Admitting: Internal Medicine

## 2013-10-30 DIAGNOSIS — I495 Sick sinus syndrome: Secondary | ICD-10-CM

## 2013-11-09 ENCOUNTER — Telehealth: Payer: Self-pay | Admitting: Internal Medicine

## 2013-11-09 ENCOUNTER — Ambulatory Visit (INDEPENDENT_AMBULATORY_CARE_PROVIDER_SITE_OTHER): Payer: Medicare Other

## 2013-11-09 DIAGNOSIS — I4891 Unspecified atrial fibrillation: Secondary | ICD-10-CM

## 2013-11-09 DIAGNOSIS — Z5181 Encounter for therapeutic drug level monitoring: Secondary | ICD-10-CM

## 2013-11-09 LAB — BASIC METABOLIC PANEL
BUN: 20 mg/dL (ref 6–23)
CO2: 27 mEq/L (ref 19–32)
Calcium: 8.7 mg/dL (ref 8.4–10.5)
Chloride: 107 mEq/L (ref 96–112)
Creatinine, Ser: 1 mg/dL (ref 0.4–1.5)
GFR: 76.24 mL/min (ref >60.00)
Glucose, Bld: 96 mg/dL (ref 70–99)
POTASSIUM: 4.3 meq/L (ref 3.5–5.1)
Sodium: 139 mEq/L (ref 135–145)

## 2013-11-09 LAB — CBC
HCT: 43.7 % (ref 39.0–52.0)
Hemoglobin: 14.3 g/dL (ref 13.0–17.0)
MCHC: 32.8 g/dL (ref 30.0–36.0)
MCV: 92.3 fl (ref 78.0–100.0)
PLATELETS: 197 10*3/uL (ref 150.0–400.0)
RBC: 4.73 Mil/uL (ref 4.22–5.81)
RDW: 14.4 % (ref 11.5–14.6)
WBC: 7.6 10*3/uL (ref 4.5–10.5)

## 2013-11-09 NOTE — Progress Notes (Signed)
Pt was started on Eliquis 5mg  BID by Dr Rayann Heman for atrial fibrillation on 07/16/2012.    Reviewed patients medication list.  Pt is not currently on any combined P-gp and strong CYP3A4 inhibitors/inducers (ketoconazole, traconazole, ritonavir, carbamazepine, phenytoin, rifampin, St. John's wort).  Reviewed labs.  SCr 1.0 , Weight 83.1kg, Age 52yr .  Dose appropriate based on criteria.   Hgb and HCT Within Normal Limits 11/09/13 14.3/43.7.

## 2013-11-09 NOTE — Patient Instructions (Addendum)
A full discussion of the nature of anticoagulants has been carried out.  A benefit/risk analysis has been presented to the patient, so that they understand the justification for choosing anticoagulation with Eliquis at this time.  The need for compliance is stressed.  Pt is aware to take the medication twice daily.  Side effects of potential bleeding are discussed, including unusual colored urine or stools, coughing up blood or coffee ground emesis, nose bleeds or serious fall or head trauma.  Discussed signs and symptoms of stroke. The patient should avoid any OTC items containing aspirin or ibuprofen.  Avoid alcohol consumption.   Call if any signs of abnormal bleeding.  Discussed financial obligations and resolved any difficulty in obtaining medication.  Next lab test test in 6 months.  Made f/u appt on 05/06/14 at 10am, pt aware.

## 2013-11-09 NOTE — Telephone Encounter (Signed)
Walk in pt form " Cardiovascular" paper Dropped off gave to [facility]

## 2013-11-23 ENCOUNTER — Other Ambulatory Visit: Payer: Self-pay | Admitting: Internal Medicine

## 2014-01-01 ENCOUNTER — Encounter: Payer: Self-pay | Admitting: Internal Medicine

## 2014-01-01 ENCOUNTER — Ambulatory Visit (INDEPENDENT_AMBULATORY_CARE_PROVIDER_SITE_OTHER): Payer: Medicare Other | Admitting: Internal Medicine

## 2014-01-01 VITALS — BP 123/71 | HR 54 | Ht 69.0 in | Wt 175.0 lb

## 2014-01-01 DIAGNOSIS — Z950 Presence of cardiac pacemaker: Secondary | ICD-10-CM

## 2014-01-01 DIAGNOSIS — R0602 Shortness of breath: Secondary | ICD-10-CM

## 2014-01-01 DIAGNOSIS — I4891 Unspecified atrial fibrillation: Secondary | ICD-10-CM

## 2014-01-01 DIAGNOSIS — I10 Essential (primary) hypertension: Secondary | ICD-10-CM

## 2014-01-01 DIAGNOSIS — I495 Sick sinus syndrome: Secondary | ICD-10-CM

## 2014-01-01 LAB — MDC_IDC_ENUM_SESS_TYPE_INCLINIC
Battery Voltage: 2.76 V
Implantable Pulse Generator Model: 5826
Implantable Pulse Generator Serial Number: 1284954
Lead Channel Impedance Value: 556 Ohm
Lead Channel Pacing Threshold Amplitude: 0.5 V
Lead Channel Pacing Threshold Pulse Width: 0.4 ms
Lead Channel Setting Pacing Amplitude: 2 V
Lead Channel Setting Pacing Pulse Width: 0.4 ms
Lead Channel Setting Sensing Sensitivity: 1.5 mV
MDC IDC MSMT BATTERY IMPEDANCE: 1800 Ohm
MDC IDC MSMT LEADCHNL RA SENSING INTR AMPL: 2.2 mV
MDC IDC MSMT LEADCHNL RV IMPEDANCE VALUE: 364 Ohm
MDC IDC MSMT LEADCHNL RV SENSING INTR AMPL: 7.7 mV
MDC IDC SESS DTM: 20150417095052
MDC IDC SET LEADCHNL RV PACING AMPLITUDE: 2.5 V

## 2014-01-01 NOTE — Patient Instructions (Addendum)
Your physician recommends that you schedule a follow-up appointment in: 3-4 weeks [place] wants you to follow-up in: 12 months with Dr Vallery Ridge will receive a reminder letter in the mail two months in advance. If you don't receive a letter, please call our office to schedule the follow-up appointment   Your physician has requested that you have an echocardiogram. Echocardiography is a painless test that uses sound waves to create images of your heart. It provides your doctor with information about the size and shape of your heart and how well your heart's chambers and valves are working. This procedure takes approximately one hour. There are no restrictions for this procedure.   Your physician has requested that you have a lexiscan myoview. For further information please visit [email or web address]. Please follow instruction sheet, as given.

## 2014-01-03 DIAGNOSIS — R0602 Shortness of breath: Secondary | ICD-10-CM | POA: Insufficient documentation

## 2014-01-03 NOTE — Progress Notes (Signed)
PCP: Dwan Bolt, MD  [name] is a 80 y.o. male who presents today for routine electrophysiology followup.  Since last being seen in our clinic, the patient reports doing reasonably well.  He has recently begun to notice symptoms of fatigue and decreased exercise tolerance.  He reports SOB with exertion.[name]  He feels that his SOB is worse than last visit. Today, he denies symptoms of palpitations, lower extremity edema, dizziness, presyncope, or syncope.  She continues to tolerate eliquis without difficulty.   He denies bleeding. The patient is otherwise without complaint today.   Past Medical History  Diagnosis Date  . Syncope     resolved s/p PPM  . Macular degeneration (senile) of retina   . CAD (coronary artery disease) 09/2008  . Hypertension   . Persistent atrial fibrillation   . Complication of anesthesia     06/12/11 anesthesia made legs asleep also   . PONV (postoperative nausea and vomiting)   . Diastolic dysfunction     hx of chf due to fluid overload 9/12   . Sick sinus syndrome     St. Jude  . GERD (gastroesophageal reflux disease)   . Cancer     basal cell carcinoma   Past Surgical History  Procedure Laterality Date  . Right inguinal hernia repair  11/19/2002    with mesh  . Umblical hernia  12/29/2438  . Perforated ulcer    . Laparoscopic cholecystectomy w/ cholangiography  06/12/2011  . Cholecystectomy  06/12/11  . Pacemaker insertion  09/28/08    SJM Zephyr XL DR  . Transurethral resection of prostate  09/06/2011  . Transurethral resection of bladder tumor  09/06/2011    Procedure: TRANSURETHRAL RESECTION OF BLADDER TUMOR (TURBT);  Surgeon: Ailene Rud, MD;  Location: [facility];  Service: Urology;  Laterality: N/A;    Current Outpatient Prescriptions  Medication Sig Dispense Refill  . ELIQUIS 5 MG TABS tablet TAKE 1 TABLET (5 MG TOTAL) BY MOUTH 2 (TWO) TIMES DAILY.  60 tablet  6  . lisinopril (PRINIVIL,ZESTRIL) 5 MG tablet Take 5 mg by mouth  daily.      . magnesium oxide (MAG-OX) 400 MG tablet Take 400 mg by mouth daily.      . Multiple Vitamins-Minerals (ICAPS AREDS FORMULA PO) Take 1 capsule by mouth daily.       [name] omeprazole (PRILOSEC) 20 MG capsule Take 20 mg by mouth daily.       [name] PRESCRIPTION MEDICATION Inject 1 vial as directed every 30 (thirty) days. Injection given by doctor in right eye for macular degeneration.       No current facility-administered medications for this visit.    Physical Exam: Filed Vitals:   01/01/14 0901  BP: 123/71  Pulse: 54  Height: 5\' 9"  (1.753 m)  Weight: 175 lb (79.379 kg)    GEN- The patient is well appearing, alert and oriented x 3 today.   Head- normocephalic, atraumatic Eyes-  Sclera clear, conjunctiva pink Ears- hearing intact Oropharynx- clear Lungs- Clear to ausculation bilaterally, normal work of breathing Chest- pacemaker pocket is well healed Heart- irregular rate and rhythm, no murmurs, rubs or gallops, PMI not laterally displaced GI- soft, NT, ND, + BS Extremities- no clubbing, cyanosis, or edema  Pacemaker interrogation- reviewed in detail today,  See PACEART report  Assessment and Plan:  1. Bradycardia Normal pacemaker function See [name] Art report Device reprogrammed toVVR today to promote battery longevity.    2. Permanent afib Minimally symptomatic.  This precedes the patients SOB.   Given its longstanding nature, I think that our ability to achieve and maintain sinus rhythm is low.  We discussed cardioversion.  He would like to avoid this if able.  I think that given his advanced age that this is reasonable. He appears to be tolerating eliquis without difficulty at this point  3. HTN Stable No change required today  4. SOB I will obtain an echo and lexiscan myoview to further evaluate these.  IF low risk, then conservative management is planned.  Return in 12 months

## 2014-01-11 ENCOUNTER — Other Ambulatory Visit (HOSPITAL_COMMUNITY): Payer: Medicare Other

## 2014-01-12 ENCOUNTER — Encounter: Payer: Self-pay | Admitting: Internal Medicine

## 2014-01-19 ENCOUNTER — Ambulatory Visit (HOSPITAL_COMMUNITY): Payer: Medicare Other | Attending: Cardiology | Admitting: Cardiology

## 2014-01-19 ENCOUNTER — Ambulatory Visit (HOSPITAL_COMMUNITY): Payer: Medicare Other | Attending: Cardiology | Admitting: Radiology

## 2014-01-19 VITALS — BP 150/94 | HR 58 | Ht 69.0 in | Wt 175.0 lb

## 2014-01-19 DIAGNOSIS — R0609 Other forms of dyspnea: Secondary | ICD-10-CM

## 2014-01-19 DIAGNOSIS — R0602 Shortness of breath: Secondary | ICD-10-CM

## 2014-01-19 DIAGNOSIS — I4729 Other ventricular tachycardia: Secondary | ICD-10-CM

## 2014-01-19 DIAGNOSIS — I509 Heart failure, unspecified: Secondary | ICD-10-CM | POA: Insufficient documentation

## 2014-01-19 DIAGNOSIS — R0989 Other specified symptoms and signs involving the circulatory and respiratory systems: Secondary | ICD-10-CM | POA: Insufficient documentation

## 2014-01-19 DIAGNOSIS — I495 Sick sinus syndrome: Secondary | ICD-10-CM

## 2014-01-19 DIAGNOSIS — I4891 Unspecified atrial fibrillation: Secondary | ICD-10-CM

## 2014-01-19 DIAGNOSIS — I472 Ventricular tachycardia, unspecified: Secondary | ICD-10-CM

## 2014-01-19 DIAGNOSIS — I079 Rheumatic tricuspid valve disease, unspecified: Secondary | ICD-10-CM | POA: Insufficient documentation

## 2014-01-19 DIAGNOSIS — I059 Rheumatic mitral valve disease, unspecified: Secondary | ICD-10-CM | POA: Insufficient documentation

## 2014-01-19 DIAGNOSIS — I10 Essential (primary) hypertension: Secondary | ICD-10-CM | POA: Insufficient documentation

## 2014-01-19 DIAGNOSIS — R55 Syncope and collapse: Secondary | ICD-10-CM | POA: Insufficient documentation

## 2014-01-19 DIAGNOSIS — I2510 Atherosclerotic heart disease of native coronary artery without angina pectoris: Secondary | ICD-10-CM

## 2014-01-19 MED ORDER — TECHNETIUM TC 99M SESTAMIBI GENERIC - CARDIOLITE
10.0000 | Freq: Once | INTRAVENOUS | Status: AC | PRN
Start: 2014-01-19 — End: 2014-01-19
  Administered 2014-01-19: 10 via INTRAVENOUS

## 2014-01-19 MED ORDER — TECHNETIUM TC 99M SESTAMIBI GENERIC - CARDIOLITE
30.0000 | Freq: Once | INTRAVENOUS | Status: AC | PRN
  Administered 2014-01-19: 30 via INTRAVENOUS

## 2014-01-19 MED ORDER — ADENOSINE (DIAGNOSTIC) 3 MG/ML IV SOLN
0.5600 mg/kg | Freq: Once | INTRAVENOUS | Status: AC
  Administered 2014-01-19: 44.4 mg via INTRAVENOUS

## 2014-01-19 NOTE — Progress Notes (Signed)
Echo performed. 

## 2014-01-19 NOTE — Progress Notes (Signed)
[facility] [address] [place], [place] [address] [phone]    Cardiology Nuclear Med Study  [name] is a 80 y.o. male     MRN : [id number]     DOB: 1931/02/25  Procedure Date: 01/19/2014  Nuclear Med Background Indication for Stress Test:  Evaluation for Ischemia History:  CAD, Cath (ok per pt), PTVP, Afib, Echo 01/2014 (Pending), MPI, 2010 EF 70% Cardiac Risk Factors: Strong Family History - CAD, History of Smoking and Hypertension  Symptoms:  DOE and Fatigue   Nuclear Pre-Procedure Caffeine/Decaff Intake:  None > 12 hrs NPO After: 9:00pm   Lungs:  clear O2 Sat: 96% on room air. IV 0.9% NS with Angio Cath:  22g  IV Site: L Antecubital x 1, tolerated well IV Started by:  Irven Baltimore, RN  Chest Size (in):  42 Cup Size: n/a  Height: 5\' 9"  (1.753 m)  Weight:  175 lb (79.379 kg)  BMI:  Body mass index is 25.83 kg/(m^2). Tech Comments:  Lisinopril this am    Nuclear Med Study 1 or 2 day study: 1 day  Stress Test Type:  Adenosine  Reading MD: N/A  Order Authorizing Provider:  Thompson Grayer, MD  Resting Radionuclide: Technetium 16m Sestamibi  Resting Radionuclide Dose: 11.0 mCi   Stress Radionuclide:  Technetium 21m Sestamibi  Stress Radionuclide Dose: 33.0 mCi           Stress Protocol Rest HR: 58 Stress HR: 64  Rest BP: 150/94 Stress BP: 148/94  Exercise Time (min): n/a METS: n/a           Dose of Adenosine (mg):  44.5 mg Dose of Lexiscan: n/a mg  Dose of Atropine (mg): n/a Dose of Dobutamine: n/a mcg/kg/min (at max HR)  Stress Test Technologist: Glade Lloyd, BS-ES  Nuclear Technologist:  Charlton Amor, CNMT     Rest Procedure:  Myocardial perfusion imaging was performed at rest 45 minutes following the intravenous administration of Technetium 54m Sestamibi. Rest ECG: NSR-LBBB  Stress Procedure:  The patient received IV adenosine at 140 mcg/kg/min for 4 minutes.  Technetium 80m Sestamibi was injected at the 2 minute  mark and quantitative spect images were obtained after a 45 minute delay.  During the infusion of Adenosine, the patient complained of feeling SOB and flushed.  This completely resolved in recovery.  Stress ECG: Uninteretable due to baseline LBBB  QPS Raw Data Images:  Normal; no motion artifact; normal heart/lung ratio. Stress Images:  Normal homogeneous uptake in all areas of the myocardium. Rest Images:  Normal homogeneous uptake in all areas of the myocardium. Subtraction (SDS):  No evidence of ischemia. Transient Ischemic Dilatation (Normal <1.22):  1.07 Lung/Heart Ratio (Normal <0.45):  0.37  Quantitative Gated Spect Images QGS EDV:  112 ml QGS ESV:  51 ml  Impression Exercise Capacity:  Adenosine study with no exercise. BP Response:  Normal blood pressure response. Clinical Symptoms:  No chest pain. ECG Impression:  Baseline:  LBBB.  EKG uninterpretable due to LBBB at rest and stress. Comparison with Prior Nuclear Study: No significant change from previous study  Overall Impression:  Low risk stress nuclear study. Ventricular paced rhythm -- LBBB. No ischemia or scar. Normal LV systolic function.  LV Ejection Fraction: 54%.  LV Wall Motion:  Normal Wall Motion   Darlin Coco MD

## 2014-01-25 ENCOUNTER — Ambulatory Visit: Payer: Medicare Other | Admitting: Physician Assistant

## 2014-01-29 ENCOUNTER — Encounter: Payer: Self-pay | Admitting: Internal Medicine

## 2014-01-29 DIAGNOSIS — I498 Other specified cardiac arrhythmias: Secondary | ICD-10-CM

## 2014-02-03 ENCOUNTER — Encounter: Payer: Self-pay | Admitting: Physician Assistant

## 2014-02-03 ENCOUNTER — Encounter (INDEPENDENT_AMBULATORY_CARE_PROVIDER_SITE_OTHER): Payer: Self-pay

## 2014-02-03 ENCOUNTER — Ambulatory Visit (INDEPENDENT_AMBULATORY_CARE_PROVIDER_SITE_OTHER): Payer: Medicare Other | Admitting: Physician Assistant

## 2014-02-03 VITALS — BP 110/74 | HR 65 | Ht 69.0 in | Wt 178.4 lb

## 2014-02-03 DIAGNOSIS — R42 Dizziness and giddiness: Secondary | ICD-10-CM

## 2014-02-03 DIAGNOSIS — Z950 Presence of cardiac pacemaker: Secondary | ICD-10-CM

## 2014-02-03 DIAGNOSIS — R0602 Shortness of breath: Secondary | ICD-10-CM

## 2014-02-03 DIAGNOSIS — I2510 Atherosclerotic heart disease of native coronary artery without angina pectoris: Secondary | ICD-10-CM

## 2014-02-03 DIAGNOSIS — I4891 Unspecified atrial fibrillation: Secondary | ICD-10-CM

## 2014-02-03 NOTE — Patient Instructions (Signed)
WE WILL SEND OUT A REMINDER LETTER FOR YOU TO MAKE AN APPT TO SEE DR. ALLRED AROUND April 2016  NO CHANGES WERE MADE TODAY

## 2014-02-03 NOTE — Progress Notes (Signed)
Cardiology Office Note   Date:  02/03/2014   ID:  TOBIAS AVITABILE, DOB [birth year], MRN [id number]  PCP:  Dwan Bolt, MD  Cardiologist:  Dr. Thompson Grayer      History of Present Illness: [name] is a 80 y.o. male with a hx of symptomatic bradycardia s/p PPM, persistent AFib, HTN, diastolic CHF.  Last seen by Dr. Thompson Grayer last month in f/u.  He noted symptoms of fatigue and dyspnea.  Pacer function was normal.  Nuc stress test and Echo were arranged.  The Myoview was low risk and neg for ischemia.  Echo demonstrated normal LVF.  He returns for follow up.  He is doing well. We discussed his neck stiffness and dizziness that has been ongoing for years since his gallbladder surgery. He really denies any significant dyspnea with exertion. He is NYHA class 2-2b. He denies chest discomfort. He denies syncope near syncope. Denies orthopnea, PND or edema.   Studies:  - Echo (01/19/14):  Mild LVH, EF 50-55%, no RWMA, mod MR, mod LAE, mild RVE, mod RAE, mild to mod TR, PASP 44 mmHg  - Nuclear (01/2014):  No ischemia or scar, EF 54%; Low Risk    Recent Labs: 11/09/2013: Creatinine 1.0; Hemoglobin 14.3; Potassium 4.3   Wt Readings from Last 3 Encounters:  02/03/14 178 lb 6.4 oz (80.922 kg)  01/19/14 175 lb (79.379 kg)  01/01/14 175 lb (79.379 kg)     Past Medical History  Diagnosis Date  . Syncope     resolved s/p PPM  . Macular degeneration (senile) of retina   . CAD (coronary artery disease) 09/2008  . Hypertension   . Persistent atrial fibrillation   . Complication of anesthesia     06/12/11 anesthesia made legs asleep also   . PONV (postoperative nausea and vomiting)   . Diastolic dysfunction     hx of chf due to fluid overload 9/12   . Sick sinus syndrome     St. Jude  . GERD (gastroesophageal reflux disease)   . Cancer     basal cell carcinoma    Current Outpatient Prescriptions  Medication Sig Dispense Refill  . ELIQUIS 5 MG TABS tablet TAKE 1 TABLET (5 MG  TOTAL) BY MOUTH 2 (TWO) TIMES DAILY.  60 tablet  6  . lisinopril (PRINIVIL,ZESTRIL) 5 MG tablet Take 5 mg by mouth daily.      . magnesium oxide (MAG-OX) 400 MG tablet Take 400 mg by mouth daily.      . Multiple Vitamins-Minerals (ICAPS AREDS FORMULA PO) Take 1 capsule by mouth daily.       [name] omeprazole (PRILOSEC) 20 MG capsule Take 20 mg by mouth daily.       [name] PRESCRIPTION MEDICATION Inject 1 vial as directed every 30 (thirty) days. Injection given by doctor in right eye for macular degeneration.       No current facility-administered medications for this visit.    Allergies:   Review of patient's allergies indicates no known allergies.   Social History:  The patient  reports that he quit smoking about 55 years ago. He has never used smokeless tobacco. He reports that he does not drink alcohol or use illicit drugs.   Family History:  The patient's family history is not on file.   ROS:  Please see the history of present illness.      All other systems reviewed and negative.   PHYSICAL EXAM: VS:  BP 110/74  Pulse 65  Ht  5\' 9"  (1.753 m)  Wt 178 lb 6.4 oz (80.922 kg)  BMI 26.33 kg/m2 Well nourished, well developed, in no acute distress HEENT: normal Neck: no JVD Cardiac:  normal S1, S2; irregularly irregular rhythm; no murmur Lungs:  Decreased breath sounds bilaterally, no wheezing, rhonchi or rales Abd: soft, nontender, no hepatomegaly Ext: no edema Skin: warm and dry Neuro:  CNs 2-12 intact, no focal abnormalities noted  EKG:  Underlying atrial fibrillation, V. Paced, HR 65     ASSESSMENT AND PLAN:  1. Shortness of breath:  Myoview low risk.  Echo with normal LV function and mild pulmonary hypertension.  I suspect his elevated PASP is related to prior history of smoking. Overall, his dyspnea is not that severe. No further workup. 2. Atrial fibrillation: Rate controlled. He is tolerating Eliquis. 3. Pacemaker: Followup with EP as planned. 4. Dizziness: Followup with primary  care. 5. Disposition: Followup 1 year with Dr. Rayann Heman    Signed, Richardson Dopp, PA-C, MHS 02/03/2014 9:28 AM   [address], [place], [place]  [address] Phone: [phone]; Fax: [phone]

## 2014-04-30 DIAGNOSIS — I498 Other specified cardiac arrhythmias: Secondary | ICD-10-CM

## 2014-05-06 ENCOUNTER — Ambulatory Visit (INDEPENDENT_AMBULATORY_CARE_PROVIDER_SITE_OTHER): Payer: Medicare Other

## 2014-05-06 DIAGNOSIS — I4891 Unspecified atrial fibrillation: Secondary | ICD-10-CM

## 2014-05-06 DIAGNOSIS — Z5181 Encounter for therapeutic drug level monitoring: Secondary | ICD-10-CM

## 2014-05-06 LAB — CBC
HCT: 42.5 % (ref 39.0–52.0)
HEMOGLOBIN: 14.1 g/dL (ref 13.0–17.0)
MCHC: 33.2 g/dL (ref 30.0–36.0)
MCV: 92.2 fl (ref 78.0–100.0)
Platelets: 187 10*3/uL (ref 150.0–400.0)
RBC: 4.61 Mil/uL (ref 4.22–5.81)
RDW: 13.6 % (ref 11.5–15.5)
WBC: 8 10*3/uL (ref 4.0–10.5)

## 2014-05-06 LAB — BASIC METABOLIC PANEL
BUN: 19 mg/dL (ref 6–23)
CO2: 29 mEq/L (ref 19–32)
Calcium: 8.7 mg/dL (ref 8.4–10.5)
Chloride: 104 mEq/L (ref 96–112)
Creatinine, Ser: 1 mg/dL (ref 0.4–1.5)
GFR: 72.78 mL/min (ref >60.00)
Glucose, Bld: 90 mg/dL (ref 70–99)
POTASSIUM: 4 meq/L (ref 3.5–5.1)
SODIUM: 138 meq/L (ref 135–145)

## 2014-05-06 NOTE — Progress Notes (Signed)
Pt was started on Eliquis 5mg  BID by Dr Rayann Heman for afib on 07/16/12.    Reviewed patients medication list.  Pt is not currently on any combined P-gp and strong CYP3A4 inhibitors/inducers (ketoconazole, traconazole, ritonavir, carbamazepine, phenytoin, rifampin, St. John's wort).  Reviewed labs.  SCr 1.0 on 05/06/14, Weight 80kg, Age-45.  Dose appropriate based on specified criteria.   Hgb and HCT Within Normal Limits 14.1/42.5 on 05/06/14.

## 2014-05-06 NOTE — Patient Instructions (Addendum)
A full discussion of the nature of anticoagulants has been carried out.  A benefit/risk analysis has been presented to the patient, so that they understand the justification for choosing anticoagulation with Eliquis at this time.  The need for compliance is stressed.  Pt is aware to take the medication twice daily.  Side effects of potential bleeding are discussed, including unusual colored urine or stools, coughing up blood or coffee ground emesis, nose bleeds or serious fall or head trauma.  Discussed signs and symptoms of stroke. The patient should avoid any OTC items containing aspirin or ibuprofen.  Avoid alcohol consumption.   Call if any signs of abnormal bleeding.  Discussed financial obligations and resolved any difficulty in obtaining medication.  Next lab test test in 6 months.    Called pt with results instructed pt to continue on same dosage of Eliquis 5mg  twice daily.  Repeat bloodwork in 6 months on 11/02/14 at 9am.

## 2014-05-12 ENCOUNTER — Telehealth: Payer: Self-pay | Admitting: Internal Medicine

## 2014-05-12 NOTE — Telephone Encounter (Signed)
New message   Wife calling stating someone called there home today. Returning call back

## 2014-05-13 ENCOUNTER — Telehealth: Payer: Self-pay | Admitting: Internal Medicine

## 2014-05-13 NOTE — Telephone Encounter (Signed)
Spoke with wife and let  Her know labs are great

## 2014-05-13 NOTE — Telephone Encounter (Signed)
New message     Returning a nurses

## 2014-05-13 NOTE — Telephone Encounter (Signed)
lmtcb

## 2014-05-14 ENCOUNTER — Telehealth: Payer: Self-pay | Admitting: Internal Medicine

## 2014-05-14 NOTE — Telephone Encounter (Signed)
Spoke with [place].  Eliquis authorized from 04/14/14-05/14/15 case no [id number], letter will be sent to provider and patient.

## 2014-05-14 NOTE — Telephone Encounter (Signed)
Pt's wife advised.

## 2014-05-14 NOTE — Telephone Encounter (Signed)
New message    Wife calling    Have question concern eliquis - receive a letter from drug benefit . As of 05/27/14 medication will be expire.   Need information from MD .

## 2014-05-14 NOTE — Telephone Encounter (Signed)
[phone] [employer] prescription drug benefit, case no [id number].

## 2014-05-14 NOTE — Telephone Encounter (Signed)
Wife states Eliquis authorization ends in September and pt has been instructed to ask doctor to call toll free number to [employer].

## 2014-05-25 ENCOUNTER — Encounter: Payer: Self-pay | Admitting: Internal Medicine

## 2014-07-30 ENCOUNTER — Emergency Department (HOSPITAL_COMMUNITY)
Admission: EM | Admit: 2014-07-30 | Discharge: 2014-07-30 | Disposition: A | Discharge status: Home / Self Care | Payer: Medicare Other | Attending: Emergency Medicine | Admitting: Emergency Medicine

## 2014-07-30 ENCOUNTER — Emergency Department (HOSPITAL_COMMUNITY): Payer: Medicare Other

## 2014-07-30 ENCOUNTER — Encounter (HOSPITAL_COMMUNITY): Payer: Self-pay | Admitting: *Deleted

## 2014-07-30 DIAGNOSIS — K4021 Bilateral inguinal hernia, without obstruction or gangrene, recurrent: Secondary | ICD-10-CM | POA: Diagnosis not present

## 2014-07-30 DIAGNOSIS — R1032 Left lower quadrant pain: Secondary | ICD-10-CM

## 2014-07-30 DIAGNOSIS — Z87891 Personal history of nicotine dependence: Secondary | ICD-10-CM | POA: Insufficient documentation

## 2014-07-30 DIAGNOSIS — Z79899 Other long term (current) drug therapy: Secondary | ICD-10-CM | POA: Diagnosis not present

## 2014-07-30 DIAGNOSIS — K5732 Diverticulitis of large intestine without perforation or abscess without bleeding: Secondary | ICD-10-CM

## 2014-07-30 DIAGNOSIS — I2510 Atherosclerotic heart disease of native coronary artery without angina pectoris: Secondary | ICD-10-CM | POA: Diagnosis not present

## 2014-07-30 DIAGNOSIS — K219 Gastro-esophageal reflux disease without esophagitis: Secondary | ICD-10-CM | POA: Diagnosis not present

## 2014-07-30 DIAGNOSIS — I10 Essential (primary) hypertension: Secondary | ICD-10-CM | POA: Insufficient documentation

## 2014-07-30 LAB — COMPREHENSIVE METABOLIC PANEL
ALK PHOS: 94 U/L (ref 39–117)
ALT: 12 U/L (ref 0–53)
ANION GAP: 12 (ref 5–15)
AST: 18 U/L (ref 0–37)
Albumin: 3.2 g/dL — ABNORMAL LOW (ref 3.5–5.2)
BILIRUBIN TOTAL: 0.5 mg/dL (ref 0.3–1.2)
BUN: 17 mg/dL (ref 6–23)
CHLORIDE: 102 meq/L (ref 96–112)
CO2: 23 meq/L (ref 19–32)
CREATININE: 0.86 mg/dL (ref 0.50–1.35)
Calcium: 9 mg/dL (ref 8.4–10.5)
GFR calc Af Amer: >90 mL/min (ref >90)
GFR calc non Af Amer: 79 mL/min — ABNORMAL LOW (ref >90)
Glucose, Bld: 156 mg/dL — ABNORMAL HIGH (ref 70–99)
POTASSIUM: 4.1 meq/L (ref 3.7–5.3)
Sodium: 137 mEq/L (ref 137–147)
Total Protein: 7.3 g/dL (ref 6.0–8.3)

## 2014-07-30 LAB — CBC
HEMATOCRIT: 42.8 % (ref 39.0–52.0)
Hemoglobin: 14.1 g/dL (ref 13.0–17.0)
MCH: 29.7 pg (ref 26.0–34.0)
MCHC: 32.9 g/dL (ref 30.0–36.0)
MCV: 90.3 fL (ref 78.0–100.0)
Platelets: 191 10*3/uL (ref 150–400)
RBC: 4.74 MIL/uL (ref 4.22–5.81)
RDW: 13 % (ref 11.5–15.5)
WBC: 10.4 10*3/uL (ref 4.0–10.5)

## 2014-07-30 LAB — URINALYSIS, ROUTINE W REFLEX MICROSCOPIC
BILIRUBIN URINE: NEGATIVE
Glucose, UA: NEGATIVE mg/dL
Hgb urine dipstick: NEGATIVE
KETONES UR: NEGATIVE mg/dL
LEUKOCYTES UA: NEGATIVE
Nitrite: NEGATIVE
PROTEIN: NEGATIVE mg/dL
Specific Gravity, Urine: 1.014 (ref 1.005–1.030)
Urobilinogen, UA: 1 mg/dL (ref 0.0–1.0)
pH: 6.5 (ref 5.0–8.0)

## 2014-07-30 MED ORDER — METRONIDAZOLE 500 MG PO TABS: 500.0000 mg | ORAL_TABLET | Freq: Three times a day (TID) | ORAL | Status: DC

## 2014-07-30 MED ORDER — CYCLOBENZAPRINE HCL 10 MG PO TABS: 10.0000 mg | ORAL_TABLET | Freq: Two times a day (BID) | ORAL | Status: DC | PRN

## 2014-07-30 MED ORDER — IOHEXOL 300 MG/ML  SOLN
100.0000 mL | Freq: Once | INTRAMUSCULAR | Status: AC | PRN
  Administered 2014-07-30: 100 mL via INTRAVENOUS

## 2014-07-30 MED ORDER — IOHEXOL 300 MG/ML  SOLN
25.0000 mL | INTRAMUSCULAR | Status: AC | PRN
Start: 2014-07-30 — End: 2014-07-30
  Administered 2014-07-30 (×2): 25 mL via ORAL
  Filled 2014-07-30 (×2): qty 30

## 2014-07-30 MED ORDER — CIPROFLOXACIN HCL 500 MG PO TABS: 500.0000 mg | ORAL_TABLET | Freq: Two times a day (BID) | ORAL | Status: DC

## 2014-07-30 NOTE — Discharge Instructions (Signed)

## 2014-07-30 NOTE — ED Provider Notes (Signed)
CSN: [id number]     Arrival date & time 07/30/14  5277 History   First MD Initiated Contact with Patient 07/30/14 [phone]     Chief Complaint  Patient presents with  . Groin Pain     (Consider location/radiation/quality/duration/timing/severity/associated sxs/prior Treatment) Patient is a 80 y.o. male presenting with groin pain. The history is provided by the patient.  Groin Pain The current episode started more than 1 week ago. The problem occurs daily. The problem has been gradually worsening. Pertinent negatives include no chest pain, no abdominal pain and no shortness of breath. Nothing aggravates the symptoms. Nothing relieves the symptoms.    Past Medical History  Diagnosis Date  . Syncope     resolved s/p PPM  . Macular degeneration (senile) of retina   . CAD (coronary artery disease) 09/2008  . Hypertension   . Persistent atrial fibrillation   . Complication of anesthesia     06/12/11 anesthesia made legs asleep also   . PONV (postoperative nausea and vomiting)   . Diastolic dysfunction     hx of chf due to fluid overload 9/12   . Sick sinus syndrome     [employer]  . GERD (gastroesophageal reflux disease)   . Cancer     basal cell carcinoma   Past Surgical History  Procedure Laterality Date  . Right inguinal hernia repair  11/19/2002    with mesh  . Umblical hernia  3/53/6144  . Perforated ulcer    . Laparoscopic cholecystectomy w/ cholangiography  06/12/2011  . Cholecystectomy  06/12/11  . Pacemaker insertion  09/28/08    SJM Zephyr XL DR  . Transurethral resection of prostate  09/06/2011  . Transurethral resection of bladder tumor  09/06/2011    Procedure: TRANSURETHRAL RESECTION OF BLADDER TUMOR (TURBT);  Surgeon: Ailene Rud, MD;  Location: [facility];  Service: Urology;  Laterality: N/A;   History reviewed. No pertinent family history. History  Substance Use Topics  . Smoking status: Former Smoker    Quit date: 09/17/1958  . Smokeless tobacco: Never Used      Comment: quit 1968  . Alcohol Use: No    Review of Systems  Constitutional: Negative for fever.  Respiratory: Negative for cough and shortness of breath.   Cardiovascular: Negative for chest pain.  Gastrointestinal: Negative for vomiting and abdominal pain.  All other systems reviewed and are negative.     Allergies  Review of patient's allergies indicates no known allergies.  Home Medications   Prior to Admission medications   Medication Sig Start Date End Date Taking? Authorizing Provider  ELIQUIS 5 MG TABS tablet TAKE 1 TABLET (5 MG TOTAL) BY MOUTH 2 (TWO) TIMES DAILY. 10/18/13   Thompson Grayer, MD  lisinopril (PRINIVIL,ZESTRIL) 5 MG tablet Take 5 mg by mouth daily.    Historical Provider, MD  magnesium oxide (MAG-OX) 400 MG tablet Take 400 mg by mouth daily.    Historical Provider, MD  Multiple Vitamins-Minerals (ICAPS AREDS FORMULA PO) Take 1 capsule by mouth daily.     Historical Provider, MD  omeprazole (PRILOSEC) 20 MG capsule Take 20 mg by mouth daily.     Historical Provider, MD  PRESCRIPTION MEDICATION Inject 1 vial as directed every 30 (thirty) days. Injection given by doctor in right eye for macular degeneration.    Historical Provider, MD   BP 147/86 mmHg  Temp(Src) 97.6 F (36.4 C) (Oral)  Resp 20  SpO2 100% Physical Exam  Constitutional: He is oriented to person, place,  and time. He appears well-developed and well-nourished. No distress.  HENT:  Head: Normocephalic and atraumatic.  Mouth/Throat: Oropharynx is clear and moist. No oropharyngeal exudate.  Eyes: EOM are normal. Pupils are equal, round, and reactive to light.  Neck: Normal range of motion. Neck supple.  Cardiovascular: Normal rate and regular rhythm.  Exam reveals no friction rub.   No murmur heard. Pulmonary/Chest: Effort normal and breath sounds normal. No respiratory distress. He has no wheezes. He has no rales.  Abdominal: Soft. He exhibits no distension. There is no tenderness. There is no  rebound. Hernia confirmed negative in the right inguinal area and confirmed negative in the left inguinal area.  Genitourinary: Right testis shows no mass, no swelling and no tenderness. Left testis shows no mass, no swelling and no tenderness.  Musculoskeletal: Normal range of motion. He exhibits no edema.  Lymphadenopathy:       Right: No inguinal adenopathy present.       Left: No inguinal adenopathy present.  Neurological: He is alert and oriented to person, place, and time.  Skin: No rash noted. He is not diaphoretic.  Nursing note and vitals reviewed.   ED Course  Procedures (including critical care time) Labs Review Labs Reviewed  COMPREHENSIVE METABOLIC PANEL - Abnormal; Notable for the following:    Glucose, Bld 156 (*)    Albumin 3.2 (*)    GFR calc non Af Amer 79 (*)    All other components within normal limits  CBC  URINALYSIS, ROUTINE W REFLEX MICROSCOPIC    Imaging Review Ct Abdomen Pelvis W Contrast  07/30/2014   CLINICAL DATA:  Left lower quadrant pain for 1 day  EXAM: CT ABDOMEN AND PELVIS WITH CONTRAST  TECHNIQUE: Multidetector CT imaging of the abdomen and pelvis was performed using the standard protocol following bolus administration of intravenous contrast. Oral contrast was also administered.  CONTRAST:  177mL OMNIPAQUE IOHEXOL 300 MG/ML  SOLN  COMPARISON:  None.  FINDINGS: There is fibrotic change in the lung bases bilaterally. Pacemaker leads are attached to the right atrium and right ventricle. There is enlargement of the right ventricle.  There is an 8 mm cyst in the anterior right lobe of the liver at the dome. No other focal liver lesions are identified. The liver is mildly prominent measuring 16.4 cm in length. Gallbladder is absent. There is no appreciable biliary duct dilatation.  There are surgical clips in the upper abdomen. Spleen, pancreas, and right adrenal appear normal. There is slight left adrenal hypertrophy.  There is a cyst in the lateral right  kidney measuring 1.8 x 1.5 cm. No other renal masses are identified. There is no renal hydronephrosis. There is no renal or ureteral calculus on either side.  In the pelvis, the urinary bladder is midline with normal wall thickness. There is a right inguinal hernia. A portion of the anterior aspect of the urinary bladder extends into this hernia without compromise of the bladder in this area. There is a small left inguinal hernia which contains a small amount of bowel but no bowel compromise. There are multiple prostatic calculi. Appendix appears normal.  There are multiple sigmoid diverticula. There is diverticulitis involving the proximal to mid sigmoid colon. There is an early phlegmon in this area measuring 2.7 x 1.7 cm, best seen on axial slice 56 series 947. There is no demonstrable perforation in this area.  There is a small ventral hernia containing only fat.  There is no bowel obstruction. No free air or  portal venous air. There is wall thickening in the second portion of the duodenum consistent with duodenitis in this area.  There is no ascites or adenopathy in the abdomen or pelvis. There is extensive atherosclerotic change in the aorta and iliac arteries without appreciable aneurysm. There is degenerative change in the lumbar spine. No blastic or lytic bone lesions.  IMPRESSION: Diverticulitis in the mid sigmoid region with phlegmon in this area. No air-fluid level is seen to confirm abscess in this area. No perforation in this area.  Wall thickening in the second portion of the duodenum consistent with duodenitis. No perforation is seen in this area. There may well be ulceration in this area, however.  No renal or ureteral calculi. No hydronephrosis. Appendix appears normal.  There are bilateral inguinal hernias. A portion of the anterior urinary bladder extends into the right inguinal hernia without compromise. A small amount of bowel is seen in the left inguinal hernia without compromise.  Small  ventral hernia containing only fat.  Enlarged right ventricle.  Fibrosis in lung bases.  Mild left adrenal hypertrophy.  These results were called by telephone at the time of interpretation on 07/30/2014 at 10:53 am to Dr. Evelina Bucy , who verbally acknowledged these results.   Electronically Signed   By: Lowella Grip M.D.   On: 07/30/2014 10:54     EKG Interpretation None      MDM   Final diagnoses:  Left groin pain  Diverticulitis of large intestine without perforation or abscess without bleeding  Bilateral recurrent inguinal hernia without obstruction or gangrene    80 year old male here with left groin pain. History of multiple abdominal surgeries before. Intermittent, worse with standing up. Will occasionally feel a bulge there. Was given NSAIDs by his PCP the pain is persisting. Here vitals are stable, well-appearing. No pain at this time. Normal GU and left inguinal exam. No evidence of hernia on my exam. History consistent with hernia with worse than standing, but does have remote history of kidney stones and with multiple abdominal surgeries will CT scan his belly.  CT shows diverticulitis with possible early phlegmon. Bilateral inguinal hernias with bladder slipping into the right inguinal hernia without evidence of obstruction. I spoke with patient does not want stay for IV antibiotics. He has follow-up which I have coordinated with his PCP. Given Cipro Flagyl go home. Given surgery followed first hernias. Patient is stable for discharge and comfortable with this plan.  I have reviewed all labs and imaging and considered them in my medical decision making.    Evelina Bucy, MD 07/30/14 [phone]

## 2014-07-30 NOTE — ED Notes (Signed)
Pt reports left thigh pain and into left groin x 5 weeks but became more severe yesterday. Has been to pcp and given anti inflammatory medications but no relief. Pain increases with movement.

## 2014-07-30 NOTE — ED Notes (Signed)
Pt returned from CT °

## 2014-08-02 ENCOUNTER — Other Ambulatory Visit: Payer: Self-pay | Admitting: Internal Medicine

## 2014-08-05 ENCOUNTER — Encounter: Payer: Self-pay | Admitting: Internal Medicine

## 2014-08-05 DIAGNOSIS — R001 Bradycardia, unspecified: Secondary | ICD-10-CM

## 2014-09-23 ENCOUNTER — Other Ambulatory Visit (INDEPENDENT_AMBULATORY_CARE_PROVIDER_SITE_OTHER): Payer: Self-pay | Admitting: General Surgery

## 2014-09-27 ENCOUNTER — Telehealth: Payer: Self-pay | Admitting: Internal Medicine

## 2014-09-27 NOTE — Telephone Encounter (Signed)
New Msg        Pt wife calling to see if pt has been cleared to have surgery with Dr. Dalbert Batman.   Pt is on eliquis and other meds that need to be stopped.   Please contact pt and wife at [phone].

## 2014-09-28 NOTE — Telephone Encounter (Signed)
Clearance sent for patient to Dr Darrel Hoover office

## 2014-10-04 ENCOUNTER — Encounter (HOSPITAL_COMMUNITY): Payer: Self-pay | Admitting: Pharmacy Technician

## 2014-10-04 NOTE — Pre-Procedure Instructions (Signed)
[name]  10/04/2014   Your procedure is scheduled on: Wednesday October 06, 2014  Report to [facility] Admitting at 9:45 AM.  Call this number if you have problems the morning of surgery: [phone]     Remember:   Do not eat food or drink liquids after midnight.   Take these medicines the morning of surgery with A SIP OF WATER: Omeprazole (Prilosec)   Please follow instructions from your physician concerning Eliquis    Do not wear jewelry  Do not wear lotions, powders, or cologne. You may not wear deodorant.  Men may shave face and neck.  Do not bring valuables to the hospital.  [facility] is not responsible for any belongings or valuables.               Contacts, dentures or bridgework may not be worn into surgery.  Leave suitcase in the car. After surgery it may be brought to your room.  For patients admitted to the hospital, discharge time is determined by your treatment team.               Patients discharged the day of surgery will not be allowed to drive home.  Name and phone number of your driver:  Special Instructions: Shower using CHG soap the night before and the morning of your surgery   Please read over the following fact sheets that you were given: Pain Booklet, Coughing and Deep Breathing and Surgical Site Infection Prevention  [place] - Preparing for Surgery  Before surgery, you can play an important role.  Because skin is not sterile, your skin needs to be as free of germs as possible.  You can reduce the number of germs on you skin by washing with CHG (chlorahexidine gluconate) soap before surgery.  CHG is an antiseptic cleaner which kills germs and bonds with the skin to continue killing germs even after washing.  Please DO NOT use if you have an allergy to CHG or antibacterial soaps.  If your skin becomes reddened/irritated stop using the CHG and inform your nurse when you arrive at Short Stay.  Do not shave (including legs and  underarms) for at least 48 hours prior to the first CHG shower.  You may shave your face.  Please follow these instructions carefully:   1.  Shower with CHG Soap the night before surgery and the morning of Surgery.  2.  If you choose to wash your hair, wash your hair first as usual with your normal shampoo.  3.  After you shampoo, rinse your hair and body thoroughly to remove the shampoo.  4.  Use CHG as you would any other liquid soap.  You can apply CHG directly to the skin and wash gently with scrungie or a clean washcloth.  5.  Apply the CHG Soap to your body ONLY FROM THE NECK DOWN.  Do not use on open wounds or open sores.  Avoid contact with your eyes, ears, mouth and genitals (private parts).  Wash genitals (private parts) with your normal soap.  6.  Wash thoroughly, paying special attention to the area where your surgery will be performed.  7.  Thoroughly rinse your body with warm water from the neck down.  8.  DO NOT shower/wash with your normal soap after using and rinsing off the CHG Soap.  9.  Pat yourself dry with a clean towel.            10.  Wear  clean pajamas.            11.  Place clean sheets on your bed the night of your first shower and do not sleep with pets.  Day of Surgery  Do not apply any lotions/deoderants the morning of surgery.  Please wear clean clothes to the hospital/surgery center.

## 2014-10-05 ENCOUNTER — Encounter (HOSPITAL_COMMUNITY): Payer: Self-pay

## 2014-10-05 ENCOUNTER — Encounter (HOSPITAL_COMMUNITY)
Admission: RE | Admit: 2014-10-05 | Discharge: 2014-10-05 | Disposition: A | Discharge status: Home / Self Care | Payer: Medicare Other | Source: Ambulatory Visit | Attending: General Surgery | Admitting: General Surgery

## 2014-10-05 DIAGNOSIS — I495 Sick sinus syndrome: Secondary | ICD-10-CM | POA: Diagnosis not present

## 2014-10-05 DIAGNOSIS — I4891 Unspecified atrial fibrillation: Secondary | ICD-10-CM | POA: Diagnosis not present

## 2014-10-05 DIAGNOSIS — I2510 Atherosclerotic heart disease of native coronary artery without angina pectoris: Secondary | ICD-10-CM | POA: Diagnosis not present

## 2014-10-05 DIAGNOSIS — K4020 Bilateral inguinal hernia, without obstruction or gangrene, not specified as recurrent: Secondary | ICD-10-CM | POA: Diagnosis not present

## 2014-10-05 DIAGNOSIS — K219 Gastro-esophageal reflux disease without esophagitis: Secondary | ICD-10-CM | POA: Diagnosis not present

## 2014-10-05 DIAGNOSIS — K5792 Diverticulitis of intestine, part unspecified, without perforation or abscess without bleeding: Secondary | ICD-10-CM | POA: Diagnosis not present

## 2014-10-05 DIAGNOSIS — Z87891 Personal history of nicotine dependence: Secondary | ICD-10-CM | POA: Diagnosis not present

## 2014-10-05 DIAGNOSIS — K5732 Diverticulitis of large intestine without perforation or abscess without bleeding: Secondary | ICD-10-CM | POA: Diagnosis not present

## 2014-10-05 DIAGNOSIS — K4090 Unilateral inguinal hernia, without obstruction or gangrene, not specified as recurrent: Secondary | ICD-10-CM | POA: Diagnosis present

## 2014-10-05 DIAGNOSIS — I5032 Chronic diastolic (congestive) heart failure: Secondary | ICD-10-CM | POA: Diagnosis not present

## 2014-10-05 DIAGNOSIS — I10 Essential (primary) hypertension: Secondary | ICD-10-CM | POA: Diagnosis not present

## 2014-10-05 DIAGNOSIS — Z8711 Personal history of peptic ulcer disease: Secondary | ICD-10-CM | POA: Diagnosis not present

## 2014-10-05 DIAGNOSIS — Z950 Presence of cardiac pacemaker: Secondary | ICD-10-CM | POA: Diagnosis not present

## 2014-10-05 DIAGNOSIS — R55 Syncope and collapse: Secondary | ICD-10-CM | POA: Diagnosis not present

## 2014-10-05 HISTORY — DX: Presence of cardiac pacemaker: Z95.0

## 2014-10-05 HISTORY — DX: Personal history of other medical treatment: Z92.89

## 2014-10-05 HISTORY — DX: Cardiac arrhythmia, unspecified: I49.9

## 2014-10-05 HISTORY — DX: Reserved for inherently not codable concepts without codable children: IMO0001

## 2014-10-05 LAB — CBC WITH DIFFERENTIAL/PLATELET
BASOS PCT: 0 % (ref 0–1)
Basophils Absolute: 0 10*3/uL (ref 0.0–0.1)
Eosinophils Absolute: 0.1 10*3/uL (ref 0.0–0.7)
Eosinophils Relative: 2 % (ref 0–5)
HEMATOCRIT: 47 % (ref 39.0–52.0)
HEMOGLOBIN: 15.9 g/dL (ref 13.0–17.0)
LYMPHS ABS: 1.6 10*3/uL (ref 0.7–4.0)
LYMPHS PCT: 23 % (ref 12–46)
MCH: 30.2 pg (ref 26.0–34.0)
MCHC: 33.8 g/dL (ref 30.0–36.0)
MCV: 89.2 fL (ref 78.0–100.0)
Monocytes Absolute: 0.7 10*3/uL (ref 0.1–1.0)
Monocytes Relative: 10 % (ref 3–12)
NEUTROS PCT: 65 % (ref 43–77)
Neutro Abs: 4.5 10*3/uL (ref 1.7–7.7)
Platelets: 186 10*3/uL (ref 150–400)
RBC: 5.27 MIL/uL (ref 4.22–5.81)
RDW: 13.8 % (ref 11.5–15.5)
WBC: 6.9 10*3/uL (ref 4.0–10.5)

## 2014-10-05 LAB — COMPREHENSIVE METABOLIC PANEL
ALBUMIN: 3.6 g/dL (ref 3.5–5.2)
ALT: 20 U/L (ref 0–53)
AST: 24 U/L (ref 0–37)
Alkaline Phosphatase: 80 U/L (ref 39–117)
Anion gap: 7 (ref 5–15)
BUN: 12 mg/dL (ref 6–23)
CALCIUM: 9.3 mg/dL (ref 8.4–10.5)
CHLORIDE: 106 meq/L (ref 96–112)
CO2: 27 mmol/L (ref 19–32)
CREATININE: 0.95 mg/dL (ref 0.50–1.35)
GFR calc Af Amer: 88 mL/min — ABNORMAL LOW (ref >90)
GFR calc non Af Amer: 76 mL/min — ABNORMAL LOW (ref >90)
Glucose, Bld: 114 mg/dL — ABNORMAL HIGH (ref 70–99)
Potassium: 4.2 mmol/L (ref 3.5–5.1)
Sodium: 140 mmol/L (ref 135–145)
TOTAL PROTEIN: 7.7 g/dL (ref 6.0–8.3)
Total Bilirubin: 0.8 mg/dL (ref 0.3–1.2)

## 2014-10-05 LAB — PROTIME-INR
INR: 1.01 (ref 0.00–1.49)
Prothrombin Time: 13.4 seconds (ref 11.6–15.2)

## 2014-10-05 LAB — APTT: aPTT: 29 seconds (ref 24–37)

## 2014-10-05 MED ORDER — CEFAZOLIN SODIUM-DEXTROSE 2-3 GM-% IV SOLR
2.0000 g | INTRAVENOUS | Status: AC
  Administered 2014-10-06: 2 g via INTRAVENOUS
  Filled 2014-10-05: qty 50

## 2014-10-05 NOTE — H&P (Signed)
[name]  Location: [facility] Patient #: [id number] DOB: 1931-01-17 Married / Language: English / Race: White Male       History of Present Illness Patient words: reck hernia.  The patient is a 81 year old male who presents with an inguinal hernia. This is an 81 year old gentleman who returns for preoperative evaluation of a symptomatic left inguinal hernia. Dr. Wilson Singer is his PCP. Dr. Rayann Heman is his cardiologist. He was recently treated for acute diverticulitis. He has a history of a right inguinal hernia repair by Dr. Rise Patience in the past. His CT scan showed acute diverticulitis with a small phlegmon but no abscess. The CT scan also showed bilateral inguinal hernias, very small on the right. He continues to have symptoms in the left groin with bulge and discomfort. He doesn't really have much symptoms on the right. He saw Dr. Carol Ada who is managing his diverticulitis with observation now. Comorbidities include laparotomy for control of bleeding peptic ulcer 1984. Syncope. Sick sinus syndrome, atrial fibrillation. Pacemaker. Coronary artery disease. Hypertension. GERD. He wants to go ahead with repair of the left inguinal hernia and that will be scheduled. We talked about whether or not to explore the right groin. Since I do not feel a hernia with any type of maneuver and he is really not symptomatic we decided to hold off on surgery on the right side. He is aware there is a small chance he may develop symptomatically hernia on the right. He is comfortable with that. I discussed the indications, details, techniques, and numerous risk of the surgery with him. He is aware of the risk of bleeding, infection, recurrence, nerve damage, chronic pain, injury to the testicle or bladder or intestine was major reconstructive surgery. Cardiac pulmonary and thromboembolic problems. He understands all of these issues. All of his questions are answered. He agrees with  this plan. He will be observed in the hospital overnight for surgical complications and for cardiac observation. We will ask Dr. Rayann Heman for cardiac clearance and permission to stop the Eliquis 3 days preop   Allergies  No Known Drug Allergies11/19/2015  Medication History  Eliquis (5MG  Tablet, Oral) Active. Cyclobenzaprine HCl (10MG  Tablet, Oral) Active. MetroNIDAZOLE (500MG  Tablet, Oral) Active.  Vitals  Weight: 173 lb Height: 69in Body Surface Area: 1.95 m Body Mass Index: 25.55 kg/m Pulse: 77 (Regular)  BP: 126/80 (Sitting, Left Arm, Standard)    Physical Exam  General Note: Alert. Pleasant. Cooperative. No distress.   Head and Neck Note: No adenopathy. No mass.   Chest and Lung Exam Note: Clear to auscultation bilaterally. Pacemaker left infraclavicular area.   Cardiovascular Note: Heart sounds basically normal. Regular rate and rhythm. Pacemaker noted.   Abdomen Note: Abdomen soft and nontender now. Normal bowel sounds. Not distended. Well-healed upper midline scar without hernia.   Male Genitourinary Note: Moderately large left inguinal hernia. A little tender but reducible. Scar right groin well-healed. I cannot detect a bulge or a hernia or any tenderness on the right side. Penis scrotum and testes normal.   Lymphatic Note: No adenopathy of neck, axilla, or inguinal regions.     Assessment & Plan LEFT INGUINAL HERNIA (550.90  K40.90) Current Plans  Schedule for Surgery It appears that you have recovered from your episode of diverticulitis. You have a large reducible left inguinal hernia. I cannot feel a hernia on the right side, although the CT scan suggest a small recurrent hernia. We have discussed management, and we have decided to repair  the left inguinal hernia with mesh. We are going to hold off on exploration of the right groin because of the absence of detectable hernia and symptoms. There is a small chance you might  develop a hernia on the right side in the future. We will check with Dr. Rayann Heman, your cardiologist. We will need to stop your Eliquis 3 days prior to surgery to lower the risk of bleeding. You will be observed in the hospital overnight to make sure there are no complications. We have discussed the techniques and risks of this surgery in detail.  HISTORY OF RIGHT INGUINAL HERNIA REPAIR (V45.89  Z98.89)  HISTORY OF SYNCOPE (V15.89  Z87.898)  CAD IN NATIVE ARTERY (414.01  I25.10)  HISTORY OF BLEEDING PEPTIC ULCER (V12.71  Z87.11)  CHRONIC DIASTOLIC CONGESTIVE HEART FAILURE (428.32  I50.32)  HYPERTENSION, BENIGN (401.1  I10)  H/O SICK SINUS SYNDROME (V12.59  Z86.79)  PACEMAKER (V45.01  Z95.0)  DIVERTICULITIS OF LARGE INTESTINE WITHOUT PERFORATION OR ABSCESS WITHOUT BLEEDING (562.11  K57.32)      [name], M.D., [facility] General and Minimally invasive Surgery Breast and Colorectal Surgery Office:   [phone] Pager:   [phone]

## 2014-10-05 NOTE — Progress Notes (Addendum)
Primary - dr. Lum Keas [place] Cardiologist - dr Shellee Milo, echo, stress -- in epic from 2015   Patient stopped eliquis 3 days ago per MD instructions  Nurse at dr. Dalbert Batman office to fax over clearance note

## 2014-10-05 NOTE — Progress Notes (Signed)
Anesthesia Chart Review:  Pt is 81 year old male scheduled for open repair L inguinal hernia, insertion of mesh on 10/06/2014 with Dr. Dalbert Batman.   PCP is Dr. Wilson Singer. Cardiologist is Dr. Rayann Heman.   PMH includes: HTN, atrial fibrillation, sick sinus syndrome, pacemaker, CHF, CAD, syncope. Former smoker.   Pt concern: When pt had cholecystectomy in 05/2011, perioperative fluids exacerbated pt's probable (at the time) CHF and he became SOB. He was placed on O2, watched in ICU for 2 days, diuresed. After ischemia was ruled out, pt was returned to the regular floor. Pt is concerned about having this experience recur with this surgery.   Medications include: Eliquis. This was stopped 2 days prior to surgery.   Preoperative labs reviewed.    EKG 02/03/2014: Electronic ventricular pacemaker.   Nuclear stress test 01/20/2014: Overall Impression: Low risk stress nuclear study. Ventricular paced rhythm -- LBBB. No ischemia or scar. Normal LV systolic function. LV Ejection Fraction: 54%. LV Wall Motion: Normal Wall Motion  Echo 01/19/2014: - Left ventricle: The cavity size was normal. There was mild concentric hypertrophy. Systolic function was normal. The estimated ejection fraction was in the range of 50% to 55%. Wall motion was normal; there were no regional wall motion abnormalities. The study was not technically sufficient to allow evaluation of LV diastolic dysfunction due to atrial fibrillation. - Aortic valve: Trileaflet; mildly thickened, mildly calcified leaflets. Trivial regurgitation. - Mitral valve: Moderate regurgitation. - Left atrium: The atrium was moderately dilated. - Right ventricle: The cavity size was mildly dilated. Wall thickness was normal. - Right atrium: The atrium was moderately dilated. - Tricuspid valve: Mild-moderate regurgitation. - Pulmonic valve: Trivial regurgitation. - Pulmonary arteries: Systolic pressure was mildly increased. PA peak pressure: 29mm Hg (S).  Pt has  cardiology clearance from Dr. Rayann Heman.   Awaiting pacemaker prescription form.   If no changes, I anticipate pt can proceed with surgery as scheduled.   Willeen Cass, FNP-BC [facility]/Anesthesiology Phone: [phone] 10/05/2014 2:40 PM

## 2014-10-06 ENCOUNTER — Ambulatory Visit (HOSPITAL_COMMUNITY)
Admission: RE | Admit: 2014-10-06 | Discharge: 2014-10-07 | Disposition: A | Discharge status: Home / Self Care | Payer: Medicare Other | Source: Ambulatory Visit | Attending: General Surgery | Admitting: General Surgery

## 2014-10-06 ENCOUNTER — Encounter (HOSPITAL_COMMUNITY)
Admission: RE | Disposition: A | Discharge status: Home / Self Care | Payer: Self-pay | Source: Ambulatory Visit | Attending: General Surgery

## 2014-10-06 ENCOUNTER — Ambulatory Visit (HOSPITAL_COMMUNITY): Payer: Medicare Other | Admitting: Vascular Surgery

## 2014-10-06 ENCOUNTER — Ambulatory Visit (HOSPITAL_COMMUNITY): Payer: Medicare Other | Admitting: Anesthesiology

## 2014-10-06 DIAGNOSIS — K5732 Diverticulitis of large intestine without perforation or abscess without bleeding: Secondary | ICD-10-CM | POA: Insufficient documentation

## 2014-10-06 DIAGNOSIS — K4090 Unilateral inguinal hernia, without obstruction or gangrene, not specified as recurrent: Secondary | ICD-10-CM

## 2014-10-06 DIAGNOSIS — R55 Syncope and collapse: Secondary | ICD-10-CM | POA: Diagnosis not present

## 2014-10-06 DIAGNOSIS — K4020 Bilateral inguinal hernia, without obstruction or gangrene, not specified as recurrent: Secondary | ICD-10-CM | POA: Insufficient documentation

## 2014-10-06 DIAGNOSIS — I4891 Unspecified atrial fibrillation: Secondary | ICD-10-CM | POA: Diagnosis not present

## 2014-10-06 DIAGNOSIS — K5792 Diverticulitis of intestine, part unspecified, without perforation or abscess without bleeding: Secondary | ICD-10-CM | POA: Insufficient documentation

## 2014-10-06 DIAGNOSIS — I495 Sick sinus syndrome: Secondary | ICD-10-CM | POA: Insufficient documentation

## 2014-10-06 DIAGNOSIS — I10 Essential (primary) hypertension: Secondary | ICD-10-CM | POA: Insufficient documentation

## 2014-10-06 DIAGNOSIS — I5032 Chronic diastolic (congestive) heart failure: Secondary | ICD-10-CM | POA: Insufficient documentation

## 2014-10-06 DIAGNOSIS — Z950 Presence of cardiac pacemaker: Secondary | ICD-10-CM | POA: Insufficient documentation

## 2014-10-06 DIAGNOSIS — I2510 Atherosclerotic heart disease of native coronary artery without angina pectoris: Secondary | ICD-10-CM | POA: Insufficient documentation

## 2014-10-06 DIAGNOSIS — K219 Gastro-esophageal reflux disease without esophagitis: Secondary | ICD-10-CM | POA: Insufficient documentation

## 2014-10-06 DIAGNOSIS — Z8711 Personal history of peptic ulcer disease: Secondary | ICD-10-CM | POA: Insufficient documentation

## 2014-10-06 DIAGNOSIS — Z87891 Personal history of nicotine dependence: Secondary | ICD-10-CM | POA: Insufficient documentation

## 2014-10-06 HISTORY — PX: INGUINAL HERNIA REPAIR: SHX194

## 2014-10-06 HISTORY — PX: INSERTION OF MESH: SHX5868

## 2014-10-06 SURGERY — REPAIR, HERNIA, INGUINAL, ADULT
Anesthesia: General | Laterality: Left

## 2014-10-06 MED ORDER — MEPERIDINE HCL 25 MG/ML IJ SOLN: 6.2500 mg | INTRAMUSCULAR | Status: DC | PRN

## 2014-10-06 MED ORDER — BUPIVACAINE-EPINEPHRINE 0.5% -1:200000 IJ SOLN
INTRAMUSCULAR | Status: DC | PRN
  Administered 2014-10-06: 10 mL

## 2014-10-06 MED ORDER — ONDANSETRON HCL 4 MG/2ML IJ SOLN
4.0000 mg | Freq: Once | INTRAMUSCULAR | Status: AC | PRN
  Administered 2014-10-06: 4 mg via INTRAVENOUS

## 2014-10-06 MED ORDER — PROPOFOL 10 MG/ML IV BOLUS
INTRAVENOUS | Status: DC | PRN
  Administered 2014-10-06: 140 mg via INTRAVENOUS

## 2014-10-06 MED ORDER — MIDAZOLAM HCL 5 MG/5ML IJ SOLN
INTRAMUSCULAR | Status: DC | PRN
  Administered 2014-10-06: 2 mg via INTRAVENOUS

## 2014-10-06 MED ORDER — ENOXAPARIN SODIUM 40 MG/0.4ML ~~LOC~~ SOLN
40.0000 mg | SUBCUTANEOUS | Status: DC
  Filled 2014-10-06: qty 0.4

## 2014-10-06 MED ORDER — LACTATED RINGERS IV SOLN
INTRAVENOUS | Status: DC
  Administered 2014-10-06: 10:00:00 via INTRAVENOUS

## 2014-10-06 MED ORDER — FENTANYL CITRATE 0.05 MG/ML IJ SOLN
INTRAMUSCULAR | Status: AC
  Filled 2014-10-06: qty 5

## 2014-10-06 MED ORDER — PANTOPRAZOLE SODIUM 40 MG PO TBEC
40.0000 mg | DELAYED_RELEASE_TABLET | Freq: Every day | ORAL | Status: DC
  Administered 2014-10-06: 40 mg via ORAL
  Filled 2014-10-06 (×3): qty 1

## 2014-10-06 MED ORDER — MIDAZOLAM HCL 2 MG/2ML IJ SOLN
INTRAMUSCULAR | Status: AC
  Filled 2014-10-06: qty 2

## 2014-10-06 MED ORDER — CHLORHEXIDINE GLUCONATE 4 % EX LIQD: 1.0000 "application " | Freq: Once | CUTANEOUS | Status: DC

## 2014-10-06 MED ORDER — LIDOCAINE HCL (CARDIAC) 20 MG/ML IV SOLN
INTRAVENOUS | Status: DC | PRN
  Administered 2014-10-06: 100 mg via INTRAVENOUS

## 2014-10-06 MED ORDER — HYDROMORPHONE HCL 1 MG/ML IJ SOLN
0.5000 mg | INTRAMUSCULAR | Status: DC | PRN
  Administered 2014-10-06 (×2): 0.5 mg via INTRAVENOUS
  Filled 2014-10-06 (×2): qty 1

## 2014-10-06 MED ORDER — CYCLOBENZAPRINE HCL 10 MG PO TABS: 10.0000 mg | ORAL_TABLET | Freq: Two times a day (BID) | ORAL | Status: DC | PRN

## 2014-10-06 MED ORDER — GLYCOPYRROLATE 0.2 MG/ML IJ SOLN
INTRAMUSCULAR | Status: DC | PRN
  Administered 2014-10-06: .6 mg via INTRAVENOUS

## 2014-10-06 MED ORDER — ONDANSETRON HCL 4 MG PO TABS: 4.0000 mg | ORAL_TABLET | Freq: Four times a day (QID) | ORAL | Status: DC | PRN

## 2014-10-06 MED ORDER — BUPIVACAINE-EPINEPHRINE (PF) 0.5% -1:200000 IJ SOLN
INTRAMUSCULAR | Status: AC
  Filled 2014-10-06: qty 30

## 2014-10-06 MED ORDER — ONDANSETRON HCL 4 MG/2ML IJ SOLN
INTRAMUSCULAR | Status: DC | PRN
Start: 2014-10-06 — End: 2014-10-06
  Administered 2014-10-06: 4 mg via INTRAVENOUS

## 2014-10-06 MED ORDER — 0.9 % SODIUM CHLORIDE (POUR BTL) OPTIME
TOPICAL | Status: DC | PRN
  Administered 2014-10-06: 1000 mL

## 2014-10-06 MED ORDER — ONDANSETRON HCL 4 MG/2ML IJ SOLN
INTRAMUSCULAR | Status: AC
  Filled 2014-10-06: qty 2

## 2014-10-06 MED ORDER — ONDANSETRON HCL 4 MG/2ML IJ SOLN
4.0000 mg | Freq: Four times a day (QID) | INTRAMUSCULAR | Status: DC | PRN
  Administered 2014-10-06 (×2): 4 mg via INTRAVENOUS
  Filled 2014-10-06 (×2): qty 2

## 2014-10-06 MED ORDER — HYDROMORPHONE HCL 1 MG/ML IJ SOLN: 0.2500 mg | INTRAMUSCULAR | Status: DC | PRN

## 2014-10-06 MED ORDER — CEFAZOLIN SODIUM-DEXTROSE 2-3 GM-% IV SOLR
2.0000 g | Freq: Three times a day (TID) | INTRAVENOUS | Status: AC
  Administered 2014-10-06 (×2): 2 g via INTRAVENOUS
  Filled 2014-10-06 (×2): qty 50

## 2014-10-06 MED ORDER — HYDROCODONE-ACETAMINOPHEN 5-325 MG PO TABS: 1.0000 | ORAL_TABLET | ORAL | Status: DC | PRN

## 2014-10-06 MED ORDER — COLESEVELAM HCL 625 MG PO TABS
625.0000 mg | ORAL_TABLET | Freq: Two times a day (BID) | ORAL | Status: DC
  Administered 2014-10-06 – 2014-10-07 (×2): 625 mg via ORAL
  Filled 2014-10-06 (×5): qty 1

## 2014-10-06 MED ORDER — PHENYLEPHRINE HCL 10 MG/ML IJ SOLN
INTRAMUSCULAR | Status: DC | PRN
  Administered 2014-10-06: 80 ug via INTRAVENOUS

## 2014-10-06 MED ORDER — NEOSTIGMINE METHYLSULFATE 10 MG/10ML IV SOLN
INTRAVENOUS | Status: DC | PRN
  Administered 2014-10-06: 4 mg via INTRAVENOUS

## 2014-10-06 MED ORDER — FENTANYL CITRATE 0.05 MG/ML IJ SOLN
INTRAMUSCULAR | Status: DC | PRN
  Administered 2014-10-06 (×5): 50 ug via INTRAVENOUS

## 2014-10-06 MED ORDER — SODIUM CHLORIDE 0.9 % IV SOLN
INTRAVENOUS | Status: DC
  Administered 2014-10-06: 15:00:00 via INTRAVENOUS

## 2014-10-06 MED ORDER — BUPIVACAINE HCL 0.5 % IJ SOLN
INTRAMUSCULAR | Status: AC
  Filled 2014-10-06: qty 1

## 2014-10-06 MED ORDER — ROCURONIUM BROMIDE 100 MG/10ML IV SOLN
INTRAVENOUS | Status: DC | PRN
  Administered 2014-10-06: 30 mg via INTRAVENOUS

## 2014-10-06 SURGICAL SUPPLY — 51 items
BLADE SURG 10 STRL SS (BLADE) ×3 IMPLANT
BLADE SURG 15 STRL LF DISP TIS (BLADE) ×1 IMPLANT
BLADE SURG 15 STRL SS (BLADE) ×3
BLADE SURG ROTATE 9660 (MISCELLANEOUS) ×2 IMPLANT
CANISTER SUCTION 2500CC (MISCELLANEOUS) ×3 IMPLANT
CHLORAPREP W/TINT 26ML (MISCELLANEOUS) ×3 IMPLANT
COVER SURGICAL LIGHT HANDLE (MISCELLANEOUS) ×3 IMPLANT
DRAIN PENROSE 1/2X12 LTX STRL (WOUND CARE) ×2 IMPLANT
DRAPE LAPAROTOMY TRNSV 102X78 (DRAPE) ×3 IMPLANT
DRAPE UTILITY XL STRL (DRAPES) ×6 IMPLANT
ELECT CAUTERY BLADE 6.4 (BLADE) ×3 IMPLANT
ELECT REM PT RETURN 9FT ADLT (ELECTROSURGICAL) ×3
ELECTRODE REM PT RTRN 9FT ADLT (ELECTROSURGICAL) ×1 IMPLANT
GLOVE BIOGEL PI IND STRL 6.5 (GLOVE) IMPLANT
GLOVE BIOGEL PI INDICATOR 6.5 (GLOVE) ×2
GLOVE ECLIPSE 6.5 STRL STRAW (GLOVE) ×2 IMPLANT
GLOVE EUDERMIC 7 POWDERFREE (GLOVE) ×3 IMPLANT
GLOVE SURG SS PI 7.0 STRL IVOR (GLOVE) ×2 IMPLANT
GOWN STRL REUS W/ TWL LRG LVL3 (GOWN DISPOSABLE) ×1 IMPLANT
GOWN STRL REUS W/ TWL XL LVL3 (GOWN DISPOSABLE) ×1 IMPLANT
GOWN STRL REUS W/TWL LRG LVL3 (GOWN DISPOSABLE) ×9
GOWN STRL REUS W/TWL XL LVL3 (GOWN DISPOSABLE) ×3
KIT BASIN OR (CUSTOM PROCEDURE TRAY) ×3 IMPLANT
KIT ROOM TURNOVER OR (KITS) ×3 IMPLANT
LIQUID BAND (GAUZE/BANDAGES/DRESSINGS) ×3 IMPLANT
MESH ULTRAPRO 3X6 7.6X15CM (Mesh General) ×2 IMPLANT
NDL HYPO 25GX1X1/2 BEV (NEEDLE) ×1 IMPLANT
NEEDLE HYPO 25GX1X1/2 BEV (NEEDLE) ×3 IMPLANT
NS IRRIG 1000ML POUR BTL (IV SOLUTION) ×3 IMPLANT
PACK SURGICAL SETUP 50X90 (CUSTOM PROCEDURE TRAY) ×3 IMPLANT
PAD ARMBOARD 7.5X6 YLW CONV (MISCELLANEOUS) ×3 IMPLANT
PENCIL BUTTON HOLSTER BLD 10FT (ELECTRODE) ×3 IMPLANT
SPONGE LAP 18X18 X RAY DECT (DISPOSABLE) ×3 IMPLANT
SPONGE LAP 4X18 X RAY DECT (DISPOSABLE) ×2 IMPLANT
SUT MNCRL AB 4-0 PS2 18 (SUTURE) ×3 IMPLANT
SUT PROLENE 2 0 CT2 30 (SUTURE) ×9 IMPLANT
SUT SILK 2 0 (SUTURE) ×3
SUT SILK 2 0 SH (SUTURE) IMPLANT
SUT SILK 2-0 18XBRD TIE 12 (SUTURE) ×1 IMPLANT
SUT SILK 3 0 REEL (SUTURE) ×2 IMPLANT
SUT VIC AB 2-0 CT1 27 (SUTURE) ×6
SUT VIC AB 2-0 CT1 TAPERPNT 27 (SUTURE) ×1 IMPLANT
SUT VIC AB 3-0 SH 27 (SUTURE) ×3
SUT VIC AB 3-0 SH 27XBRD (SUTURE) ×1 IMPLANT
SYR BULB 3OZ (MISCELLANEOUS) ×3 IMPLANT
SYR CONTROL 10ML LL (SYRINGE) ×3 IMPLANT
TOWEL OR 17X24 6PK STRL BLUE (TOWEL DISPOSABLE) ×3 IMPLANT
TOWEL OR 17X26 10 PK STRL BLUE (TOWEL DISPOSABLE) ×3 IMPLANT
TUBE CONNECTING 12'X1/4 (SUCTIONS) ×1
TUBE CONNECTING 12X1/4 (SUCTIONS) ×2 IMPLANT
YANKAUER SUCT BULB TIP NO VENT (SUCTIONS) ×3 IMPLANT

## 2014-10-06 NOTE — Op Note (Signed)
Patient Name:           [name]   Date of Surgery:        10/06/2014  Pre op Diagnosis:      Left inguinal hernia  Post op Diagnosis:    Indirect left inguinal hernia  Procedure:                 Open repair left inguinal hernia with mesh(Lichtenstein repair)  Surgeon:                     Edsel Petrin. Dalbert Batman, M.D., FACS  Assistant:                      OR staff  Operative Indications:    This is an 81 year old gentleman who is evaluated for a symptomatic left inguinal hernia. Dr. Wilson Singer is his PCP. Dr. Rayann Heman is his cardiologist. He was recently treated for acute diverticulitis. He has a history of a right inguinal hernia repair by Dr. Rise Patience in the past. His CT scan showed acute diverticulitis with a small phlegmon but no abscess. The CT scan also showed bilateral inguinal hernias, very small on the right. He continues to have symptoms in the left groin with bulge and discomfort. He doesn't really have any symptoms on the right. He saw Dr. Carol Ada who is managing his diverticulitis with observation now. Comorbidities include laparotomy for control of bleeding peptic ulcer 1984. Syncope. Sick sinus syndrome, atrial fibrillation. Pacemaker. Coronary artery disease. Hypertension. GERD. He wants to go ahead with repair of the left inguinal hernia and that will be scheduled. We talked about whether or not to explore the right groin. Since I do not feel a hernia with any type of maneuver and he is really not symptomatic we decided to hold off on surgery on the right side. He is aware there is a small chance he may develop symptomatic hernia on the right. He is comfortable with that. Dr. all read as assessed him from a cardiac standpoint and has agreed that we made hold his Eliquis for 3 days preop  Operative Findings:       The patient had a reducible indirect left inguinal hernia. The floor of the inguinal canal was bulging but was not completely developed as a direct hernia. I  could feel within the peritoneal cavity through the indirect sac and there was no femoral hernia. The femoral artery was quite soft.  Procedure in Detail:          Following the induction of general anesthesia the patient's abdomen and genitalia were prepped and draped in a sterile fashion. Surgical timeout was performed. Intravenous antibiotics were given. A TAPP block was performed preop by Dr. Conrad Seba Dalkai.      0.5% Marcaine with epinephrine was used as a local infiltration anesthetic. A transverse incision was made in the left groin overlying the inguinal canal. Dissection was carried down through the subcutaneous tissue, exposing the aponeurosis of the external oblique. The external oblique was incised in the direction of its fibers, opening up the external inguinal ring. The external oblique was dissected away from the underlying structures and self-retaining retractors were placed. The cord structures were mobilized and encircled with a Penrose drain. Cremasteric muscle fibers were skeletonized. The indirect sac was dissected away from the cord structures  back to the level of the internal ring. The indirect sac was opened and inspected and found to be empty. The indirect sac  was twisted and suture ligated at the level of the internal ring with a suture ligature of 2-0 Vicryl. The redundant sac was excised and discarded. I tightened the internal ring laterally with a figure-of-eight suture of 2-0 Vicryl. The wound was irrigated. The floor of the inguinal canal was repaired and reinforced with an onlay graft of ultra Pro mesh. A 3 x 6" piece of mesh was brought to the operative field and trimmed the corners to accommodate the anatomy of the wound. The mesh was sutured in place with running sutures of 2-0 Prolene and interrupted mattress sutures of 2-0 Prolene. The mesh was sutured so as to generously overlap the pubic tubercle, then along the inguinal ligament inferiorly. Medially, superiorly, and  superiolaterally several interrupted mattress sutures of 2-0 Prolene were placed. The mesh was incised laterally so as to wraparound the cord structures at the internal ring. The tails of the mesh were overlapped laterally and a few other sutures were placed laterally. This provided secure coverage and repair both medial and lateral to the internal ring and allowed an adequate fingertip opening for the cord structures. Hemostasis was excellent       Wound  we was irrigated. The external oblique was closed with a running suture of 2-0 Vicryl placing the cord structures deep to the external oblique. Scarpa's fascia was closed with 3-0 Vicryl sutures and skin closed with a running subcuticular 4-0 Monocryl and Dermabond. The patient tolerated the procedure well was taken to PACU in stable condition. EBL 10 mL. Counts correct. Complications none.     Edsel Petrin. Dalbert Batman, M.D., FACS General and Minimally Invasive Surgery Breast and Colorectal Surgery  10/06/2014 12:40 PM

## 2014-10-06 NOTE — Transfer of Care (Signed)
Immediate Anesthesia Transfer of Care Note  Patient: [name]  Procedure(s) Performed: Procedure(s): OPEN REPAIR LEFT INGUINAL HERNIA  (Left) INSERTION OF MESH (Left)  Patient Location: PACU  Anesthesia Type:General  Level of Consciousness: awake, alert  and oriented  Airway & Oxygen Therapy: Patient Spontanous Breathing and Patient connected to nasal cannula oxygen  Post-op Assessment: Report given to PACU RN and Post -op Vital signs reviewed and stable  Post vital signs: Reviewed and stable  Complications: No apparent anesthesia complications

## 2014-10-06 NOTE — Anesthesia Procedure Notes (Signed)
Anesthesia Regional Block:  TAP block  Pre-Anesthetic Checklist: ,, timeout performed, Correct Patient, Correct Site, Correct Laterality, Correct Procedure, Correct Position, site marked, Risks and benefits discussed,  Surgical consent,  Pre-op evaluation,  At surgeon's request and post-op pain management  Laterality: Left  Prep: chloraprep       Needles:  Injection technique: Single-shot  Needle Type: Echogenic Stimulator Needle     Needle Length: 9cm 9 cm Needle Gauge: 21 and 21 G    Additional Needles:  Procedures: ultrasound guided (picture in chart) TAP block Narrative:  Start time: 10/06/2014 11:10 AM End time: 10/06/2014 11:20 AM Injection made incrementally with aspirations every 5 mL.  Performed by: Personally  Anesthesiologist: Lillia Abed  Additional Notes: Monitors applied. Patient sedated. Sterile prep and drape,hand hygiene and sterile gloves were used. Relevant anatomy identified.Needle position confirmed.Local anesthetic injected incrementally after negative aspiration. Local anesthetic spread visualized in Transversus Abdominus Plane. Vascular puncture avoided. No complications. Image printed for medical record.The patient tolerated the procedure well.

## 2014-10-06 NOTE — Interval H&P Note (Signed)
History and Physical Interval Note:  10/06/2014 10:39 AM  [name]  has presented today for surgery, with the diagnosis of left inguinal hernia  The goals and the various methods of treatment have been discussed with the patient and family. After consideration of risks, benefits and other options for treatment, the patient has consented to  Procedure(s): OPEN REPAIR LEFT INGUINAL HERNIA  (Left) INSERTION OF MESH (Left) as a surgical intervention .  The patient's history has been reviewed, patient examined today, no change in status, stable for surgery.  I have reviewed the patient's chart and labs.  Questions were answered to the patient's satisfaction.     [name]

## 2014-10-06 NOTE — Anesthesia Preprocedure Evaluation (Addendum)
Anesthesia Evaluation  Patient identified by MRN, date of birth, ID band Patient awake    Reviewed: Allergy & Precautions, NPO status , Patient's Chart, lab work & pertinent test results  History of Anesthesia Complications (+) PONV  Airway Mallampati: I  TM Distance: >3 FB Neck ROM: Full    Dental   Pulmonary former smoker,          Cardiovascular hypertension, Pt. on medications + CAD + dysrhythmias Atrial Fibrillation + pacemaker     Neuro/Psych    GI/Hepatic   Endo/Other    Renal/GU      Musculoskeletal   Abdominal   Peds  Hematology   Anesthesia Other Findings   Reproductive/Obstetrics                          Anesthesia Physical Anesthesia Plan  ASA: III  Anesthesia Plan: General   Post-op Pain Management:    Induction: Intravenous  Airway Management Planned: LMA  Additional Equipment:   Intra-op Plan:   Post-operative Plan: Extubation in OR  Informed Consent: I have reviewed the patients History and Physical, chart, labs and discussed the procedure including the risks, benefits and alternatives for the proposed anesthesia with the patient or authorized representative who has indicated his/her understanding and acceptance.     Plan Discussed with: CRNA and Surgeon  Anesthesia Plan Comments:         Anesthesia Quick Evaluation

## 2014-10-06 NOTE — Anesthesia Postprocedure Evaluation (Signed)
Anesthesia Post Note  Patient: [name]  Procedure(s) Performed: Procedure(s) (LRB): OPEN REPAIR LEFT INGUINAL HERNIA  (Left) INSERTION OF MESH (Left)  Anesthesia type: general  Patient location: PACU  Post pain: Pain level controlled  Post assessment: Patient's Cardiovascular Status Stable  Last Vitals:  Filed Vitals:   10/06/14 1428  BP: 148/81  Pulse: 59  Temp: 36.1 C  Resp: 16    Post vital signs: Reviewed and stable  Level of consciousness: sedated  Complications: No apparent anesthesia complications

## 2014-10-07 ENCOUNTER — Encounter (HOSPITAL_COMMUNITY): Payer: Self-pay | Admitting: General Surgery

## 2014-10-07 DIAGNOSIS — K4020 Bilateral inguinal hernia, without obstruction or gangrene, not specified as recurrent: Secondary | ICD-10-CM | POA: Diagnosis not present

## 2014-10-07 MED ORDER — HYDROCODONE-ACETAMINOPHEN 5-325 MG PO TABS: 1.0000 | ORAL_TABLET | Freq: Four times a day (QID) | ORAL | Status: DC | PRN

## 2014-10-07 NOTE — Discharge Summary (Signed)
Patient ID: LUCY WOOLEVER [id number] 81 y.o. Sep 02, 1931  Admit date: 10/06/2014  Discharge date and time: 10/07/2013  Admitting Physician: [name]  Discharge Physician: [name]  Admission Diagnoses: left inguinal hernia  Discharge Diagnoses: Left inguinal hernia Coronary artery disease Chronic diastolic congestive heart failure History of sick sinus syndrome Pacemaker Hypertension, benign History of bleeding peptic ulcer History of syncope Diverticulitis of large intestine, history of, recent  Operations: Procedure(s): OPEN REPAIR LEFT INGUINAL HERNIA  INSERTION OF MESH  Admission Condition: good  Discharged Condition: good  Indication for Admission: This is an 81 year old gentleman who is evaluated for a symptomatic left inguinal hernia. Dr. Wilson Singer is his PCP. Dr. Rayann Heman is his cardiologist. He was recently treated for acute diverticulitis. He has a history of a right inguinal hernia repair by Dr. Rise Patience in the past. His CT scan showed acute diverticulitis with a small phlegmon but no abscess. The CT scan also showed bilateral inguinal hernias, very small on the right. He continues to have symptoms in the left groin with bulge and discomfort. He doesn't really have any symptoms on the right. He saw Dr. Carol Ada who is managing his diverticulitis with observation now. Comorbidities include laparotomy for control of bleeding peptic ulcer 1984. Syncope. Sick sinus syndrome, atrial fibrillation. Pacemaker. Coronary artery disease. Hypertension. GERD. He wants to go ahead with repair of the left inguinal hernia and that will be scheduled. We talked about whether or not to explore the right groin. Since I do not feel a hernia with any type of maneuver and he is really not symptomatic we decided to hold off on surgery on the right side. He is aware there is a small chance he may develop symptomatic hernia on the right. He is comfortable with that.  Dr. Rayann Heman has assessed him from a cardiac standpoint and has agreed that we may hold his Eliquis for 3 days preop. He is brought to the hospital electively for his left angle hernia repair. We felt that it was prudent to observe him overnight because of his cardiac history and advanced age.  Hospital Course: On the day of admission the patient was taken to the operating room and underwent open repair of his left inguinal hernia with mesh. He had a large reducible indirect left inguinal hernia. Surgery was uneventful. The patient was admitted for overnight observation and he did well. He was able to void and ambulate to the bathroom. He was able to tolerate diet without any  vomiting. He and his wife are both in favor of him going home the morning of postop day 1. Examination on postop day 1 revealed a left groin incision looked good. There is no sign of bleeding or hematoma. The hernia repair was intact. Penis scrotum and testes look normal. Abdomen was soft.   he was given instructions in diet and activities. He was told to resume all of his usual medicines, including Eliquis. He was given a prescription for Norco for pain. He was to return to see me in the office in 3 weeks.  Consults: None  Significant Diagnostic Studies: none  Treatments: surgery: Repair left inguinal hernia with mesh  Disposition: Home  Patient Instructions:    Medication List    TAKE these medications        ciprofloxacin 500 MG tablet  Commonly known as:  CIPRO  Take 1 tablet (500 mg total) by mouth 2 (two) times daily. One po bid x 7 days     colesevelam 625  MG tablet  Commonly known as:  WELCHOL  Take 625 mg by mouth 2 (two) times daily with a meal.     cyclobenzaprine 10 MG tablet  Commonly known as:  FLEXERIL  Take 1 tablet (10 mg total) by mouth 2 (two) times daily as needed for muscle spasms.     ELIQUIS 5 MG Tabs tablet  Generic drug:  apixaban  TAKE 1 TABLET TWICE A DAY     HYDROcodone-acetaminophen  5-325 MG per tablet  Commonly known as:  NORCO  Take 1-2 tablets by mouth every 6 (six) hours as needed for moderate pain or severe pain.     magnesium oxide 400 MG tablet  Commonly known as:  MAG-OX  Take 400 mg by mouth once a week.     metroNIDAZOLE 500 MG tablet  Commonly known as:  FLAGYL  Take 1 tablet (500 mg total) by mouth 3 (three) times daily.     omeprazole 20 MG capsule  Commonly known as:  PRILOSEC  Take 20 mg by mouth daily.     VITAMIN B-12 CR PO  Take 1 tablet by mouth once a week.        Activity: Patient encouraged to ambulate as much as possible. No sports or lifting more than 15 pounds for 6 weeks. No driving for 2 weeks. Diet: low fat, low cholesterol diet Wound Care: none needed  Follow-up:  With Dr. Dalbert Batman in 3 weeks.  Signed: Edsel Petrin. Dalbert Batman, M.D., FACS General and minimally invasive surgery Breast and Colorectal Surgery  10/07/2014, 6:56 AM

## 2014-10-07 NOTE — Discharge Instructions (Signed)
See above

## 2014-10-07 NOTE — Progress Notes (Signed)
Discharge instructions and prescription for norco given and explained to pt. And pt's wife.  Both verbalize understanding of all orders/instructions and deny any questions at this time.  IV removed.  VSS. Pt discharged to home with wife with all belongings via w/c volunteer services. Graceann Congress

## 2014-11-02 ENCOUNTER — Ambulatory Visit (INDEPENDENT_AMBULATORY_CARE_PROVIDER_SITE_OTHER): Payer: Medicare Other | Admitting: *Deleted

## 2014-11-02 DIAGNOSIS — I4891 Unspecified atrial fibrillation: Secondary | ICD-10-CM

## 2014-11-02 LAB — BASIC METABOLIC PANEL
BUN: 15 mg/dL (ref 6–23)
CALCIUM: 9.3 mg/dL (ref 8.4–10.5)
CO2: 31 meq/L (ref 19–32)
Chloride: 104 mEq/L (ref 96–112)
Creatinine, Ser: 1.01 mg/dL (ref 0.40–1.50)
GFR: 75.19 mL/min (ref >60.00)
Glucose, Bld: 100 mg/dL — ABNORMAL HIGH (ref 70–99)
Potassium: 4.3 mEq/L (ref 3.5–5.1)
SODIUM: 138 meq/L (ref 135–145)

## 2014-11-02 LAB — CBC
HEMATOCRIT: 45.1 % (ref 39.0–52.0)
Hemoglobin: 15.1 g/dL (ref 13.0–17.0)
MCHC: 33.4 g/dL (ref 30.0–36.0)
MCV: 90.2 fl (ref 78.0–100.0)
Platelets: 187 10*3/uL (ref 150.0–400.0)
RBC: 4.99 Mil/uL (ref 4.22–5.81)
RDW: 14.6 % (ref 11.5–15.5)
WBC: 7.6 10*3/uL (ref 4.0–10.5)

## 2014-11-02 NOTE — Progress Notes (Signed)
Pt was started on Eliquis for Afib  on 07/16/2012.    Reviewed patients medication list.  Pt is not  currently on any combined P-gp and strong CYP3A4 inhibitors/inducers (ketoconazole, traconazole, ritonavir, carbamazepine, phenytoin, rifampin, St. John's wort).  Reviewed labs.  SCr 1.01, Weight 82 Kg, Age 81 yrs old.  Dose appropriate based on specified criteria.   Hgb and HCT 15.1/45.1  A full discussion of the nature of anticoagulants has been carried out.  A benefit/risk analysis has been presented to the patient, so that they understand the justification for choosing anticoagulation with Eliquis at this time.  The need for compliance is stressed.  Pt is aware to take the medication twice daily.  Side effects of potential bleeding are discussed, including unusual colored urine or stools, coughing up blood or coffee ground emesis, nose bleeds or serious fall or head trauma.  Discussed signs and symptoms of stroke. The patient should avoid any OTC items containing aspirin or ibuprofen.  Avoid alcohol consumption.   Call if any signs of abnormal bleeding.  Discussed financial obligations and resolved any difficulty in obtaining medication.  Next lab test test in 6 months.

## 2014-11-04 ENCOUNTER — Encounter: Payer: Self-pay | Admitting: Internal Medicine

## 2014-11-04 DIAGNOSIS — R55 Syncope and collapse: Secondary | ICD-10-CM | POA: Diagnosis not present

## 2014-11-29 ENCOUNTER — Other Ambulatory Visit: Payer: Self-pay | Admitting: Internal Medicine

## 2015-01-10 ENCOUNTER — Encounter: Payer: Self-pay | Admitting: Podiatry

## 2015-01-10 ENCOUNTER — Ambulatory Visit (INDEPENDENT_AMBULATORY_CARE_PROVIDER_SITE_OTHER): Payer: Medicare Other | Admitting: Podiatry

## 2015-01-10 DIAGNOSIS — M79676 Pain in unspecified toe(s): Secondary | ICD-10-CM | POA: Diagnosis not present

## 2015-01-10 DIAGNOSIS — B351 Tinea unguium: Secondary | ICD-10-CM | POA: Diagnosis not present

## 2015-01-10 NOTE — Progress Notes (Signed)
   Subjective:    Patient ID: [name], male    DOB: 1931/02/25, 81 y.o.   MRN: [id number]  HPI  N-SORE,THICK L-B/L TOENAILS D-6 WEEKS O-SLOWLY C-LONGER A-PRESSURE, WEARING CERTAIN TYPES SHOES T-TRIM  Review of Systems  Respiratory: Positive for shortness of breath and wheezing.        Objective:   Physical Exam  Orientated 3  Vascular: DP and PT pulses 2/4 bilaterally  Neurological: Sensation to 10 g monofilament wire intact 5/5 bilaterally Vibratory sensation intact bilaterally Ankle reflex equal and reactive bilaterally  Dermatological: The toenails are extremely elongated, brittle, discolored, hypertrophic, and tender to direct palpation 6-10  Musculoskeletal: Tailor's bunions bilaterally There is no restriction in the range of motion of the ankle, subtalar, midtarsal joints bilaterally      Assessment & Plan:   Assessment: Satisfactory neurovascular status Extremely neglected symptomatic onychomycoses 6-10  Plan: Debridement of toenails 10 without any bleeding  Reappoint 3 months

## 2015-01-12 ENCOUNTER — Other Ambulatory Visit: Payer: Self-pay | Admitting: Endocrinology

## 2015-01-12 DIAGNOSIS — R0600 Dyspnea, unspecified: Secondary | ICD-10-CM

## 2015-01-13 ENCOUNTER — Ambulatory Visit (INDEPENDENT_AMBULATORY_CARE_PROVIDER_SITE_OTHER): Payer: Medicare Other | Admitting: Internal Medicine

## 2015-01-13 DIAGNOSIS — R0600 Dyspnea, unspecified: Secondary | ICD-10-CM | POA: Diagnosis not present

## 2015-01-13 LAB — PULMONARY FUNCTION TEST
DL/VA % pred: 69 %
DL/VA: 3.07 ml/min/mmHg/L
DLCO unc % pred: 48 %
DLCO unc: 14.19 ml/min/mmHg
FEF 25-75 PRE: 0.94 L/s
FEF 25-75 Post: 1.1 L/sec
FEF2575-%Change-Post: 17 %
FEF2575-%PRED-POST: 66 %
FEF2575-%PRED-PRE: 56 %
FEV1-%CHANGE-POST: 4 %
FEV1-%Pred-Post: 80 %
FEV1-%Pred-Pre: 76 %
FEV1-Post: 2.01 L
FEV1-Pre: 1.92 L
FEV1FVC-%Change-Post: -1 %
FEV1FVC-%Pred-Pre: 86 %
FEV6-%Change-Post: 5 %
FEV6-%PRED-POST: 96 %
FEV6-%Pred-Pre: 92 %
FEV6-POST: 3.2 L
FEV6-PRE: 3.04 L
FEV6FVC-%Change-Post: 0 %
FEV6FVC-%PRED-POST: 105 %
FEV6FVC-%PRED-PRE: 105 %
FVC-%Change-Post: 5 %
FVC-%PRED-PRE: 87 %
FVC-%Pred-Post: 92 %
FVC-POST: 3.3 L
FVC-PRE: 3.12 L
PRE FEV6/FVC RATIO: 97 %
Post FEV1/FVC ratio: 61 %
Post FEV6/FVC ratio: 97 %
Pre FEV1/FVC ratio: 61 %
RV % pred: 96 %
RV: 2.49 L
TLC % pred: 87 %
TLC: 5.82 L

## 2015-01-13 NOTE — Progress Notes (Signed)
PFT done today. 

## 2015-01-18 ENCOUNTER — Encounter: Payer: Self-pay | Admitting: *Deleted

## 2015-01-26 ENCOUNTER — Telehealth: Payer: Self-pay | Admitting: Internal Medicine

## 2015-01-26 NOTE — Telephone Encounter (Signed)
Error

## 2015-02-03 DIAGNOSIS — R001 Bradycardia, unspecified: Secondary | ICD-10-CM | POA: Diagnosis not present

## 2015-03-02 ENCOUNTER — Telehealth: Payer: Self-pay | Admitting: *Deleted

## 2015-03-02 NOTE — Telephone Encounter (Signed)
unable to reach pt... 

## 2015-03-07 ENCOUNTER — Encounter: Payer: Self-pay | Admitting: Internal Medicine

## 2015-03-07 ENCOUNTER — Ambulatory Visit (INDEPENDENT_AMBULATORY_CARE_PROVIDER_SITE_OTHER): Payer: Medicare Other | Admitting: Internal Medicine

## 2015-03-07 VITALS — BP 120/80 | HR 61 | Ht 68.5 in | Wt 191.8 lb

## 2015-03-07 DIAGNOSIS — I482 Chronic atrial fibrillation: Secondary | ICD-10-CM | POA: Diagnosis not present

## 2015-03-07 DIAGNOSIS — I4821 Permanent atrial fibrillation: Secondary | ICD-10-CM

## 2015-03-07 DIAGNOSIS — I10 Essential (primary) hypertension: Secondary | ICD-10-CM | POA: Diagnosis not present

## 2015-03-07 DIAGNOSIS — I495 Sick sinus syndrome: Secondary | ICD-10-CM

## 2015-03-07 LAB — CUP PACEART INCLINIC DEVICE CHECK
Battery Impedance: 2400 Ohm
Battery Voltage: 2.76 V
Date Time Interrogation Session: 20160620133551
Lead Channel Pacing Threshold Pulse Width: 0.4 ms
Lead Channel Setting Pacing Amplitude: 2.5 V
Lead Channel Setting Pacing Pulse Width: 0.4 ms
Lead Channel Setting Sensing Sensitivity: 1.5 mV
MDC IDC MSMT LEADCHNL RV IMPEDANCE VALUE: 355 Ohm
MDC IDC MSMT LEADCHNL RV PACING THRESHOLD AMPLITUDE: 0.75 V
MDC IDC MSMT LEADCHNL RV SENSING INTR AMPL: 6.8 mV
Pulse Gen Serial Number: 1284954

## 2015-03-07 NOTE — Patient Instructions (Signed)
Medication Instructions:  Your physician recommends that you continue on your current medications as directed. Please refer to the Current Medication list given to you today.   Labwork: None ordered  Testing/Procedures: None ordered  Follow-Up: Your physician wants you to follow-up in: 12 months with Chanetta Marshall, NP You will receive a reminder letter in the mail two months in advance. If you don't receive a letter, please call our office to schedule the follow-up appointment.   Any Other Special Instructions Will Be Listed Below (If Applicable).

## 2015-03-08 NOTE — Progress Notes (Signed)
PCP: Dwan Bolt, MD  [name] is a 81 y.o. male who presents today for routine electrophysiology followup.  Since last being seen in our clinic, the patient reports doing reasonably well.  SOB is stable.  Today, he denies symptoms of palpitations, lower extremity edema, dizziness, presyncope, or syncope.  She continues to tolerate eliquis without difficulty.   He denies bleeding. The patient is otherwise without complaint today.   Past Medical History  Diagnosis Date  . Syncope     resolved s/p PPM  . Macular degeneration (senile) of retina   . CAD (coronary artery disease) 09/2008  . Hypertension   . Persistent atrial fibrillation   . Diastolic dysfunction     hx of chf due to fluid overload 9/12   . Sick sinus syndrome     [employer]  . GERD (gastroesophageal reflux disease)   . Cancer     basal cell carcinoma  . Complication of anesthesia     06/12/11 anesthesia made legs asleep also and went into chf and ended up in ICU after gallbladder surgery  . PONV (postoperative nausea and vomiting)   . CHF (congestive heart failure)   . Shortness of breath dyspnea     exertion  . Dysrhythmia     atrial fibrillation  . Presence of permanent cardiac pacemaker 2010  . History of blood product transfusion     d/t bleeding ulcer   Past Surgical History  Procedure Laterality Date  . Right inguinal hernia repair  11/19/2002    with mesh  . Umblical hernia  5/62/5638  . Perforated ulcer    . Laparoscopic cholecystectomy w/ cholangiography  06/12/2011  . Cholecystectomy  06/12/11  . Pacemaker insertion  09/28/08    [facility] XL DR  . Transurethral resection of prostate  09/06/2011  . Transurethral resection of bladder tumor  09/06/2011    Procedure: TRANSURETHRAL RESECTION OF BLADDER TUMOR (TURBT);  Surgeon: Ailene Rud, MD;  Location: [facility];  Service: Urology;  Laterality: N/A;  . Inguinal hernia repair Left 10/06/2014    Procedure: OPEN REPAIR LEFT INGUINAL HERNIA ;   Surgeon: Fanny Skates, MD;  Location: [place];  Service: General;  Laterality: Left;  . Insertion of mesh Left 10/06/2014    Procedure: INSERTION OF MESH;  Surgeon: Fanny Skates, MD;  Location: [place];  Service: General;  Laterality: Left;    Current Outpatient Prescriptions  Medication Sig Dispense Refill  . colesevelam (WELCHOL) 625 MG tablet Take 625 mg by mouth 2 (two) times daily with a meal.    . Cyanocobalamin (VITAMIN B-12 CR PO) Take 1 tablet by mouth once a week.     . cyclobenzaprine (FLEXERIL) 10 MG tablet Take 1 tablet (10 mg total) by mouth 2 (two) times daily as needed for muscle spasms. 20 tablet 0  . ELIQUIS 5 MG TABS tablet TAKE 1 TABLET TWICE A DAY (Patient taking differently: Take 5 mg by mouth twice a day) 60 tablet 6  . magnesium oxide (MAG-OX) 400 MG tablet Take 400 mg by mouth once a week.     [name] omeprazole (PRILOSEC) 20 MG capsule Take 20 mg by mouth daily.      No current facility-administered medications for this visit.    Physical Exam: Filed Vitals:   03/07/15 1130  BP: 120/80  Pulse: 61  Height: 5' 8.5" (1.74 m)  Weight: 87 kg (191 lb 12.8 oz)    GEN- The patient is well appearing, alert and oriented  x 3 today.   Head- normocephalic, atraumatic Eyes-  Sclera clear, conjunctiva pink Ears- hearing intact Oropharynx- clear Lungs- Clear to ausculation bilaterally, normal work of breathing Chest- pacemaker pocket is well healed Heart- irregular rate and rhythm, no murmurs, rubs or gallops, PMI not laterally displaced GI- soft, NT, ND, + BS Extremities- no clubbing, cyanosis, or edema  Pacemaker interrogation- reviewed in detail today,  See PACEART report  Assessment and Plan:  1. Bradycardia Normal pacemaker function See Claudia Desanctis Art report  2. Permanent afib Minimally symptomatic.   Anticoagulated with eliquis Rate controlled  3. HTN Stable No change required today   Return in 12 months to see EP NP

## 2015-03-16 ENCOUNTER — Other Ambulatory Visit: Payer: Self-pay | Admitting: Internal Medicine

## 2015-03-22 ENCOUNTER — Encounter: Payer: Self-pay | Admitting: Internal Medicine

## 2015-04-15 ENCOUNTER — Telehealth: Payer: Self-pay | Admitting: Internal Medicine

## 2015-04-15 ENCOUNTER — Other Ambulatory Visit: Payer: Self-pay

## 2015-04-15 MED ORDER — APIXABAN 5 MG PO TABS: 5.0000 mg | ORAL_TABLET | Freq: Two times a day (BID) | ORAL | Status: DC

## 2015-04-15 NOTE — Telephone Encounter (Signed)
Per's wife:   Please confirm pt's Eliquis  To  [facility] [facility] [phone]     ID [id number]

## 2015-04-22 ENCOUNTER — Other Ambulatory Visit: Payer: Self-pay | Admitting: *Deleted

## 2015-04-22 MED ORDER — APIXABAN 5 MG PO TABS: 5.0000 mg | ORAL_TABLET | Freq: Two times a day (BID) | ORAL | Status: DC

## 2015-05-03 ENCOUNTER — Ambulatory Visit (INDEPENDENT_AMBULATORY_CARE_PROVIDER_SITE_OTHER): Payer: Medicare Other | Admitting: Pharmacist

## 2015-05-03 VITALS — Wt 178.0 lb

## 2015-05-03 DIAGNOSIS — I482 Chronic atrial fibrillation: Secondary | ICD-10-CM | POA: Diagnosis not present

## 2015-05-03 DIAGNOSIS — I4821 Permanent atrial fibrillation: Secondary | ICD-10-CM

## 2015-05-03 LAB — CBC
HCT: 43.7 % (ref 39.0–52.0)
Hemoglobin: 14.5 g/dL (ref 13.0–17.0)
MCHC: 33.1 g/dL (ref 30.0–36.0)
MCV: 91.3 fl (ref 78.0–100.0)
PLATELETS: 189 10*3/uL (ref 150.0–400.0)
RBC: 4.79 Mil/uL (ref 4.22–5.81)
RDW: 14.6 % (ref 11.5–15.5)
WBC: 7.7 10*3/uL (ref 4.0–10.5)

## 2015-05-03 LAB — BASIC METABOLIC PANEL
BUN: 17 mg/dL (ref 6–23)
CALCIUM: 8.8 mg/dL (ref 8.4–10.5)
CO2: 28 mEq/L (ref 19–32)
CREATININE: 0.92 mg/dL (ref 0.40–1.50)
Chloride: 106 mEq/L (ref 96–112)
GFR: 83.64 mL/min (ref >60.00)
Glucose, Bld: 99 mg/dL (ref 70–99)
Potassium: 3.9 mEq/L (ref 3.5–5.1)
SODIUM: 138 meq/L (ref 135–145)

## 2015-05-03 NOTE — Progress Notes (Signed)
Pt was started on Eliquis for Afibon 07/16/2012.   Reviewed patients medication list. Pt is not currently on any combined P-gp and strong CYP3A4 inhibitors/inducers (ketoconazole, traconazole, ritonavir, carbamazepine, phenytoin, rifampin, St. John's wort). Reviewed labs. SCr 0.92, Weight 80.74 Kg, Age 81 yrs old. Dose appropriate based on specified criteria. Hgb and HCT 14.5/43.7, both are stable. Patient has been informed of lab results and will continue on same dosage of Eliquis 5mg  BID.  A full discussion of the nature of anticoagulants has been carried out. A benefit/risk analysis has been presented to the patient, so that they understand the justification for choosing anticoagulation with Eliquis at this time. The need for compliance is stressed. Pt is aware to take the medication twice daily. Side effects of potential bleeding are discussed, including unusual colored urine or stools, coughing up blood or coffee ground emesis, nose bleeds or serious fall or head trauma. Discussed signs and symptoms of stroke. The patient should avoid any OTC items containing aspirin or ibuprofen. Avoid alcohol consumption. Call if any signs of abnormal bleeding. Discussed financial obligations and resolved any difficulty in obtaining medication.

## 2015-05-05 DIAGNOSIS — I495 Sick sinus syndrome: Secondary | ICD-10-CM | POA: Diagnosis not present

## 2015-05-16 ENCOUNTER — Telehealth: Payer: Self-pay

## 2015-05-16 NOTE — Telephone Encounter (Signed)
Prior auth for Eliquis 5mg  sent to [facility] via Cover My Meds.

## 2015-05-25 ENCOUNTER — Telehealth: Payer: Self-pay

## 2015-05-25 NOTE — Telephone Encounter (Signed)
Approval of Eliquis from [facility]. Case [id number]. This is good through 05/15/2016.

## 2015-08-04 ENCOUNTER — Encounter: Payer: Self-pay | Admitting: Internal Medicine

## 2015-08-04 DIAGNOSIS — R001 Bradycardia, unspecified: Secondary | ICD-10-CM | POA: Diagnosis not present

## 2015-08-16 ENCOUNTER — Ambulatory Visit (INDEPENDENT_AMBULATORY_CARE_PROVIDER_SITE_OTHER): Payer: Medicare Other | Admitting: Podiatry

## 2015-08-16 ENCOUNTER — Encounter: Payer: Self-pay | Admitting: Podiatry

## 2015-08-16 DIAGNOSIS — M79676 Pain in unspecified toe(s): Secondary | ICD-10-CM

## 2015-08-16 DIAGNOSIS — B351 Tinea unguium: Secondary | ICD-10-CM

## 2015-08-16 NOTE — Progress Notes (Signed)
Patient ID: [name], male   DOB: [birth year], 81 y.o.   MRN: [id number]  Subjective: This patient presents today complaining of painful toenails and walking wearing shoes and requests toenail debridement. Also, patient request evaluation for what he believes is a pigmented skin lesion on the plantar aspect of right and left feet for several years. He does not describe any change in texture and size  Objective: The toenails are extremely elongated, incurvated, discolored, deformed and tender direct palpation 6-10 The plantar aspect the right and left feet demonstrate no evidence of pigmented lesions, bilaterally Plantar left mid foot has punctate keratoses in the skin fold Varicosities ankles bilaterally  Assessment: Symptomatic neglected onychomycoses 6-10 No evidence of pigmented lesions bilaterally Varicosities bilaterally  Plan: I advised patient that there is no evidence of pigmented lesions, bilaterally The toenails 10 were debrided mechanically and electrically without any bleeding  Reappoint intervals recommended at 3-4 months Patient request 4 months

## 2015-09-22 ENCOUNTER — Telehealth: Payer: Self-pay | Admitting: Internal Medicine

## 2015-09-22 NOTE — Telephone Encounter (Signed)
New problem   Pt want to know if he can take Mucinix while he is taking Eliquis. Please advise.

## 2015-09-22 NOTE — Telephone Encounter (Signed)
Discussed with Pharm D, okay to take.  [name] aware

## 2015-10-01 ENCOUNTER — Encounter (HOSPITAL_COMMUNITY): Payer: Self-pay | Admitting: Emergency Medicine

## 2015-10-01 ENCOUNTER — Emergency Department (HOSPITAL_COMMUNITY)
Admission: EM | Admit: 2015-10-01 | Discharge: 2015-10-01 | Disposition: A | Discharge status: Home / Self Care | Payer: Medicare Other | Attending: Emergency Medicine | Admitting: Emergency Medicine

## 2015-10-01 DIAGNOSIS — Z950 Presence of cardiac pacemaker: Secondary | ICD-10-CM | POA: Diagnosis not present

## 2015-10-01 DIAGNOSIS — I10 Essential (primary) hypertension: Secondary | ICD-10-CM | POA: Insufficient documentation

## 2015-10-01 DIAGNOSIS — Z85828 Personal history of other malignant neoplasm of skin: Secondary | ICD-10-CM | POA: Insufficient documentation

## 2015-10-01 DIAGNOSIS — Z87891 Personal history of nicotine dependence: Secondary | ICD-10-CM | POA: Insufficient documentation

## 2015-10-01 DIAGNOSIS — B029 Zoster without complications: Secondary | ICD-10-CM | POA: Insufficient documentation

## 2015-10-01 DIAGNOSIS — I509 Heart failure, unspecified: Secondary | ICD-10-CM | POA: Insufficient documentation

## 2015-10-01 DIAGNOSIS — K219 Gastro-esophageal reflux disease without esophagitis: Secondary | ICD-10-CM | POA: Insufficient documentation

## 2015-10-01 DIAGNOSIS — R110 Nausea: Secondary | ICD-10-CM | POA: Diagnosis not present

## 2015-10-01 DIAGNOSIS — Z8669 Personal history of other diseases of the nervous system and sense organs: Secondary | ICD-10-CM | POA: Insufficient documentation

## 2015-10-01 DIAGNOSIS — I2510 Atherosclerotic heart disease of native coronary artery without angina pectoris: Secondary | ICD-10-CM | POA: Diagnosis not present

## 2015-10-01 DIAGNOSIS — Z79899 Other long term (current) drug therapy: Secondary | ICD-10-CM | POA: Diagnosis not present

## 2015-10-01 DIAGNOSIS — I481 Persistent atrial fibrillation: Secondary | ICD-10-CM | POA: Diagnosis not present

## 2015-10-01 DIAGNOSIS — R079 Chest pain, unspecified: Secondary | ICD-10-CM | POA: Diagnosis present

## 2015-10-01 LAB — BASIC METABOLIC PANEL
Anion gap: 9 (ref 5–15)
BUN: 19 mg/dL (ref 6–20)
CO2: 27 mmol/L (ref 22–32)
Calcium: 9.3 mg/dL (ref 8.9–10.3)
Chloride: 103 mmol/L (ref 101–111)
Creatinine, Ser: 1.03 mg/dL (ref 0.61–1.24)
GFR calc Af Amer: >60 mL/min (ref >60)
GFR calc non Af Amer: >60 mL/min (ref >60)
Glucose, Bld: 123 mg/dL — ABNORMAL HIGH (ref 65–99)
Potassium: 4.3 mmol/L (ref 3.5–5.1)
Sodium: 139 mmol/L (ref 135–145)

## 2015-10-01 LAB — CBC
HCT: 47.6 % (ref 39.0–52.0)
Hemoglobin: 15.8 g/dL (ref 13.0–17.0)
MCH: 30.7 pg (ref 26.0–34.0)
MCHC: 33.2 g/dL (ref 30.0–36.0)
MCV: 92.6 fL (ref 78.0–100.0)
Platelets: 181 10*3/uL (ref 150–400)
RBC: 5.14 MIL/uL (ref 4.22–5.81)
RDW: 13.6 % (ref 11.5–15.5)
WBC: 6.2 10*3/uL (ref 4.0–10.5)

## 2015-10-01 LAB — I-STAT TROPONIN, ED: Troponin i, poc: 0.01 ng/mL (ref 0.00–0.08)

## 2015-10-01 MED ORDER — VALACYCLOVIR HCL 1 G PO TABS: 1000.0000 mg | ORAL_TABLET | Freq: Three times a day (TID) | ORAL | Status: AC

## 2015-10-01 MED ORDER — HYDROCODONE-ACETAMINOPHEN 5-325 MG PO TABS: 1.0000 | ORAL_TABLET | Freq: Four times a day (QID) | ORAL | Status: DC | PRN

## 2015-10-01 MED ORDER — PREDNISONE 10 MG PO TABS: 10.0000 mg | ORAL_TABLET | Freq: Every day | ORAL | Status: DC

## 2015-10-01 MED ORDER — VALACYCLOVIR HCL 500 MG PO TABS
1000.0000 mg | ORAL_TABLET | Freq: Three times a day (TID) | ORAL | Status: DC
  Administered 2015-10-01: 1000 mg via ORAL
  Filled 2015-10-01 (×2): qty 2

## 2015-10-01 NOTE — ED Notes (Signed)
Pt reports when getting out of bed today he experienced left sided chest pain with nausea. Pt reports when he took a deep breath in and let it out the pain was relieved.

## 2015-10-01 NOTE — ED Provider Notes (Signed)
CSN: [id number]     Arrival date & time 10/01/15  1015 History   First MD Initiated Contact with Patient 10/01/15 1029     Chief Complaint  Patient presents with  . Chest Pain  . Herpes Zoster   HPI  [name] is an 82 year old male with PMHx of CHF, CAD, a fib on eliquis, sick sinus syndrome with pacemaker implanted and GERD presenting with chest pain. He states he woke up this morning and felt burning chest pain over his left chest wall below his breast. He states that he swung his legs to the side of the bed and took a big breath which caused his chest pain to "catch". He states that after taking a few big breaths his pain was somewhat relieved. He states that the pain was so severe that he felt nauseous but this has since resolved. He notes that he has had burning and itching over his anterior and posterior left trunk for the past 2 days. He denies rash. His chest pain is not associated with dizziness, syncope, neck pain, arm pain, shortness of breath, diaphoresis or vomiting. He endorses a history of chickenpox as a child. He has never had shingles before. He has no other complaints at this time. He denies ear pain, eye pain, rashes of the face or changes in vision.  Past Medical History  Diagnosis Date  . Syncope     resolved s/p PPM  . Macular degeneration (senile) of retina   . CAD (coronary artery disease) 09/2008  . Hypertension   . Persistent atrial fibrillation ([place])   . Diastolic dysfunction     hx of chf due to fluid overload 9/12   . Sick sinus syndrome ([facility])     [employer]  . GERD (gastroesophageal reflux disease)   . Cancer ([place])     basal cell carcinoma  . Complication of anesthesia     06/12/11 anesthesia made legs asleep also and went into chf and ended up in ICU after gallbladder surgery  . PONV (postoperative nausea and vomiting)   . CHF (congestive heart failure) ([place])   . Shortness of breath dyspnea     exertion  . Dysrhythmia     atrial fibrillation  . Presence  of permanent cardiac pacemaker 2010  . History of blood product transfusion     d/t bleeding ulcer   Past Surgical History  Procedure Laterality Date  . Right inguinal hernia repair  11/19/2002    with mesh  . Umblical hernia  [id number]  . Perforated ulcer    . Laparoscopic cholecystectomy w/ cholangiography  06/12/2011  . Cholecystectomy  06/12/11  . Pacemaker insertion  09/28/08    [facility] XL DR  . Transurethral resection of prostate  09/06/2011  . Transurethral resection of bladder tumor  09/06/2011    Procedure: TRANSURETHRAL RESECTION OF BLADDER TUMOR (TURBT);  Surgeon: Ailene Rud, MD;  Location: [facility];  Service: Urology;  Laterality: N/A;  . Inguinal hernia repair Left 10/06/2014    Procedure: OPEN REPAIR LEFT INGUINAL HERNIA ;  Surgeon: Fanny Skates, MD;  Location: [facility];  Service: General;  Laterality: Left;  . Insertion of mesh Left 10/06/2014    Procedure: INSERTION OF MESH;  Surgeon: Fanny Skates, MD;  Location: [facility] OR;  Service: General;  Laterality: Left;   Family History  Problem Relation Age of Onset  . Heart attack Mother 44    MI  . Heart failure Father   . Heart  attack Brother     Late 42s  . Cancer Sister    Social History  Substance Use Topics  . Smoking status: Former Smoker    Quit date: 09/17/1958  . Smokeless tobacco: Never Used     Comment: quit 1968  . Alcohol Use: No    Review of Systems  Constitutional: Negative for fever and chills.  HENT: Negative for congestion, rhinorrhea and sore throat.   Eyes: Negative for discharge and redness.  Respiratory: Negative for cough and shortness of breath.   Cardiovascular: Positive for chest pain.  Gastrointestinal: Positive for nausea. Negative for vomiting, abdominal pain and diarrhea.  Genitourinary: Negative for dysuria and flank pain.  Musculoskeletal: Negative for back pain and neck pain.  Neurological: Negative for dizziness, syncope, weakness and headaches.  All other systems  reviewed and are negative.     Allergies  Review of patient's allergies indicates no known allergies.  Home Medications   Prior to Admission medications   Medication Sig Start Date End Date Taking? Authorizing Provider  apixaban (ELIQUIS) 5 MG TABS tablet Take 1 tablet (5 mg total) by mouth 2 (two) times daily. 04/22/15  Yes Thompson Grayer, MD  Cyanocobalamin (VITAMIN B-12 CR PO) Take 1 tablet by mouth once a week.    Yes Historical Provider, MD  omeprazole (PRILOSEC) 20 MG capsule Take 20 mg by mouth daily.    Yes Historical Provider, MD  HYDROcodone-acetaminophen (NORCO/VICODIN) 5-325 MG tablet Take 1-2 tablets by mouth every 6 (six) hours as needed. 10/01/15   Breeona Waid, PA-C  predniSONE (DELTASONE) 10 MG tablet Take 1 tablet (10 mg total) by mouth daily. Take 40 mg (2 tablets in the morning, 2 tablets at night) for 3 days. Then take 30 mg (2 tablets in morning, 1 at night) for 3 days. Then take 20 mg (1 tablet in morning, 1 tablet at night) for 3 days. Then take 10 mg (1 tablet in the morning) for 3 days. Total treatment duration of 12 days. 10/01/15   Jamerion Cabello, PA-C  valACYclovir (VALTREX) 1000 MG tablet Take 1 tablet (1,000 mg total) by mouth 3 (three) times daily. 10/01/15 10/15/15  Leodan Bolyard, PA-C   BP 151/105 mmHg  Pulse 60  Temp(Src) 97.6 F (36.4 C) (Oral)  Resp 18  Ht 5\' 9"  (1.753 m)  Wt 79.521 kg  BMI 25.88 kg/m2  SpO2 99% Physical Exam  Constitutional: He appears well-developed and well-nourished. No distress.  Nontoxic-appearing  HENT:  Head: Normocephalic and atraumatic.  Mouth/Throat: Oropharynx is clear and moist.  Eyes: Conjunctivae and EOM are normal. Right eye exhibits no discharge. Left eye exhibits no discharge. No scleral icterus.  Neck: Normal range of motion. Neck supple.  Cardiovascular: Normal rate, regular rhythm and normal heart sounds.   Pulmonary/Chest: Effort normal and breath sounds normal. No respiratory distress.   He exhibits no  tenderness.    Rash in dermatomal pattern noted to left anterior and left posterior chest. Erythematous with small vesicles. Mild tenderness to palpation. No pustules or crusting. No obvious abscesses. No bullae, desquamation or superficial signs of infection.  Musculoskeletal: Normal range of motion.  Neurological: He is alert. Coordination normal.  Skin: Skin is warm and dry. Rash noted.  Psychiatric: He has a normal mood and affect. His behavior is normal.  Nursing note and vitals reviewed.   ED Course  Procedures (including critical care time) Labs Review Labs Reviewed  BASIC METABOLIC PANEL - Abnormal; Notable for the following:    Glucose, Bld  123 (*)    All other components within normal limits  CBC  I-STAT TROPOININ, ED    Imaging Review No results found. I have personally reviewed and evaluated these images and lab results as part of my medical decision-making.   EKG Interpretation None      MDM   Final diagnoses:  Shingles   82 year old male presenting with left-sided chest wall pain. Prodrome of burning and itching at anterior and posterior chest wall 2 days. Patient is nontoxic-appearing. Erythematous rash with overlying vesicles in dermatomal pattern diagnostic for shingles noted to left anterior and posterior chest. Blood work unremarkable. Troponin negative with ventricularly paced EKG. Patient discharged with Valtrex, prednisone taper and pain medication. Patient is to follow-up with his PCP in 2-3 days. Pt has been advised to return to the ED if CP becomes exertional, associated with diaphoresis or nausea, radiates to left jaw/arm, worsens or becomes concerning in any way. Pt appears reliable for follow up and is agreeable to discharge.   Case has been discussed with and seen by Dr. Wilson Singer who agrees with the above plan to discharge.       Josephina Gip, PA-C 10/01/15 1555  Virgel Manifold, MD 10/12/15 1316

## 2015-10-01 NOTE — ED Notes (Signed)
Pt has a rash from left spine area to left chest area under breast-- red with small vesicles , wife at bedside. States rash hurts and itches.

## 2015-10-01 NOTE — Discharge Instructions (Signed)
Schedule a follow up appointment with your PCP.    Shingles Shingles, which is also known as herpes zoster, is an infection that causes a painful skin rash and fluid-filled blisters. Shingles is not related to genital herpes, which is a sexually transmitted infection.   Shingles only develops in people who:  Have had chickenpox.  Have received the chickenpox vaccine. (This is rare.) CAUSES Shingles is caused by varicella-zoster virus (VZV). This is the same virus that causes chickenpox. After exposure to VZV, the virus stays in the body in an inactive (dormant) state. Shingles develops if the virus reactivates. This can happen many years after the initial exposure to VZV. It is not known what causes this virus to reactivate. RISK FACTORS People who have had chickenpox or received the chickenpox vaccine are at risk for shingles. Infection is more common in people who:  Are older than age 24.  Have a weakened defense (immune) system, such as those with HIV, AIDS, or cancer.  Are taking medicines that weaken the immune system, such as transplant medicines.  Are under great stress. SYMPTOMS Early symptoms of this condition include itching, tingling, and pain in an area on your skin. Pain may be described as burning, stabbing, or throbbing. A few days or weeks after symptoms start, a painful red rash appears, usually on one side of the body in a bandlike or beltlike pattern. The rash eventually turns into fluid-filled blisters that break open, scab over, and dry up in about 2-3 weeks. At any time during the infection, you may also develop:  A fever.  Chills.  A headache.  An upset stomach. DIAGNOSIS This condition is diagnosed with a skin exam. Sometimes, skin or fluid samples are taken from the blisters before a diagnosis is made. These samples are examined under a microscope or sent to a lab for testing. TREATMENT There is no specific cure for this condition. Your health care  provider will probably prescribe medicines to help you manage pain, recover more quickly, and avoid long-term problems. Medicines may include:  Antiviral drugs.  Anti-inflammatory drugs.  Pain medicines. If the area involved is on your face, you may be referred to a specialist, such as an eye doctor (ophthalmologist) or an ear, nose, and throat (ENT) doctor to help you avoid eye problems, chronic pain, or disability. HOME CARE INSTRUCTIONS Medicines  Take medicines only as directed by your health care provider.  Apply an anti-itch or numbing cream to the affected area as directed by your health care provider. Blister and Rash Care  Take a cool bath or apply cool compresses to the area of the rash or blisters as directed by your health care provider. This may help with pain and itching.  Keep your rash covered with a loose bandage (dressing). Wear loose-fitting clothing to help ease the pain of material rubbing against the rash.  Keep your rash and blisters clean with mild soap and cool water or as directed by your health care provider.  Check your rash every day for signs of infection. These include redness, swelling, and pain that lasts or increases.  Do not pick your blisters.  Do not scratch your rash. General Instructions  Rest as directed by your health care provider.  Keep all follow-up visits as directed by your health care provider. This is important.  Until your blisters scab over, your infection can cause chickenpox in people who have never had it or been vaccinated against it. To prevent this from happening, avoid contact  with other people, especially:  Babies.  Pregnant women.  Children who have eczema.  Elderly people who have transplants.  People who have chronic illnesses, such as leukemia or AIDS. SEEK MEDICAL CARE IF:  Your pain is not relieved with prescribed medicines.  Your pain does not get better after the rash heals.  Your rash looks infected.  Signs of infection include redness, swelling, and pain that lasts or increases. SEEK IMMEDIATE MEDICAL CARE IF:  The rash is on your face or nose.  You have facial pain, pain around your eye area, or loss of feeling on one side of your face.  You have ear pain or you have ringing in your ear.  You have loss of taste.  Your condition gets worse.   This information is not intended to replace advice given to you by your health care provider. Make sure you discuss any questions you have with your health care provider.   Document Released: 09/03/2005 Document Revised: 09/24/2014 Document Reviewed: 07/15/2014 Elsevier Interactive Patient Education [employer].

## 2015-11-03 ENCOUNTER — Encounter: Payer: Self-pay | Admitting: Internal Medicine

## 2015-11-03 DIAGNOSIS — R001 Bradycardia, unspecified: Secondary | ICD-10-CM | POA: Diagnosis not present

## 2015-12-06 ENCOUNTER — Encounter (HOSPITAL_COMMUNITY): Payer: Self-pay | Admitting: Neurology

## 2015-12-06 ENCOUNTER — Emergency Department (HOSPITAL_COMMUNITY)
Admission: EM | Admit: 2015-12-06 | Discharge: 2015-12-06 | Disposition: A | Discharge status: Home / Self Care | Payer: Medicare Other | Attending: Emergency Medicine | Admitting: Emergency Medicine

## 2015-12-06 DIAGNOSIS — Z950 Presence of cardiac pacemaker: Secondary | ICD-10-CM | POA: Insufficient documentation

## 2015-12-06 DIAGNOSIS — Z8669 Personal history of other diseases of the nervous system and sense organs: Secondary | ICD-10-CM | POA: Insufficient documentation

## 2015-12-06 DIAGNOSIS — K219 Gastro-esophageal reflux disease without esophagitis: Secondary | ICD-10-CM | POA: Insufficient documentation

## 2015-12-06 DIAGNOSIS — L988 Other specified disorders of the skin and subcutaneous tissue: Secondary | ICD-10-CM | POA: Diagnosis present

## 2015-12-06 DIAGNOSIS — I10 Essential (primary) hypertension: Secondary | ICD-10-CM | POA: Diagnosis not present

## 2015-12-06 DIAGNOSIS — I509 Heart failure, unspecified: Secondary | ICD-10-CM | POA: Diagnosis not present

## 2015-12-06 DIAGNOSIS — Z79899 Other long term (current) drug therapy: Secondary | ICD-10-CM | POA: Diagnosis not present

## 2015-12-06 DIAGNOSIS — I481 Persistent atrial fibrillation: Secondary | ICD-10-CM | POA: Diagnosis not present

## 2015-12-06 DIAGNOSIS — I2510 Atherosclerotic heart disease of native coronary artery without angina pectoris: Secondary | ICD-10-CM | POA: Insufficient documentation

## 2015-12-06 DIAGNOSIS — Z85828 Personal history of other malignant neoplasm of skin: Secondary | ICD-10-CM | POA: Diagnosis not present

## 2015-12-06 DIAGNOSIS — Z87891 Personal history of nicotine dependence: Secondary | ICD-10-CM | POA: Diagnosis not present

## 2015-12-06 DIAGNOSIS — Z7901 Long term (current) use of anticoagulants: Secondary | ICD-10-CM | POA: Diagnosis not present

## 2015-12-06 DIAGNOSIS — B0229 Other postherpetic nervous system involvement: Secondary | ICD-10-CM | POA: Diagnosis not present

## 2015-12-06 MED ORDER — MORPHINE SULFATE 30 MG PO TABS: 15.0000 mg | ORAL_TABLET | Freq: Four times a day (QID) | ORAL | Status: DC | PRN

## 2015-12-06 MED ORDER — MUPIROCIN CALCIUM 2 % EX CREA: TOPICAL_CREAM | CUTANEOUS | Status: DC

## 2015-12-06 NOTE — ED Notes (Signed)
Pt here with shingles since 1/14, has been unable to control his pain at home despite PCP medication with hydrocodone, hydromorphone, prednisone, lyrica without relief. Has break out to left middle abdomen into his back. Blisters are healing, but concern the ones on his abdomen may be returning. Pt is wearing mask, is a x 4.

## 2015-12-06 NOTE — Discharge Instructions (Signed)
Postherpetic Neuralgia Postherpetic neuralgia (PHN) is nerve pain that occurs after a shingles infection. Shingles is a painful rash that appears on one side of the body, usually on your trunk or face. Shingles is caused by the varicella-zoster virus. This is the same virus that causes chickenpox. In people who have had chickenpox, the virus can resurface years later and cause shingles. You may have PHN if you continue to have pain for 3 months after your shingles rash has gone away. PHN appears in the same area where you had the shingles rash. For most people, PHN goes away within 1 year.  Getting a vaccination for shingles can prevent PHN. This vaccine is recommended for people older than 50. It may prevent shingles and may also lower your risk of PHN if you do get shingles. CAUSES PHN is caused by damage to your nerves from the varicella-zoster virus. This damage makes your nerves overly sensitive.  RISK FACTORS Aging is the biggest risk factor for developing PHN. Most people who get PHN are older than 17. Other risk factors include:  Having very bad pain before your shingles rash starts.  Having a very bad rash.  Having shingles in the nerve that supplies your face and eye (trigeminal nerve). SIGNS AND SYMPTOMS Pain is the main symptom of PHN. The pain is often very bad and may be described as stabbing, burning, or feeling like an electric shock. The pain may come and go or may be there all the time. Pain may be triggered by light touches on the skin or changes in temperature. You may have itching along with the pain. DIAGNOSIS  Your health care provider may diagnose PHN based on your symptoms and your history of shingles. Lab studies and other diagnostic tests are usually not needed. TREATMENT  There is no cure for PHN. Treatment for PHN will focus on pain relief. Over-the-counter pain relievers do not usually relieve PHN pain. You may need to work with a pain specialist. Treatment may  include:  Antidepressant medicines to help with pain and improve sleep.  Antiseizure medicines to relieve nerve pain.  Strong pain relievers (opioids).  A numbing patch worn on the skin (lidocaine patch). HOME CARE INSTRUCTIONS It may take a long time to recover from PHN. Work closely with your health care provider, and have a good support system at home.   Take all medicines as directed by your health care provider.  Wear loose, comfortable clothing.  Cover sensitive areas with a dressing to reduce friction from clothing rubbing on the area.  If cold does not make your pain worse, try applying a cool compress or cooling gel pack to the area.  Talk to your health care provider if you feel depressed or desperate. Living with long-term pain can be depressing. SEEK MEDICAL CARE IF:  Your medicine is not helping.  You are struggling to manage your pain at home.   This information is not intended to replace advice given to you by your health care provider. Make sure you discuss any questions you have with your health care provider.   Document Released: 11/24/2002 Document Revised: 09/24/2014 Document Reviewed: 08/25/2013 Elsevier Interactive Patient Education 2016 Elsevier Inc. Folliculitis Folliculitis is redness, soreness, and swelling (inflammation) of the hair follicles. This condition can occur anywhere on the body. People with weakened immune systems, diabetes, or obesity have a greater risk of getting folliculitis. CAUSES Bacterial infection. This is the most common cause. Fungal infection. Viral infection. Contact with certain chemicals, especially  oils and tars. Long-term folliculitis can result from bacteria that live in the nostrils. The bacteria may trigger multiple outbreaks of folliculitis over time. SYMPTOMS Folliculitis most commonly occurs on the scalp, thighs, legs, back, buttocks, and areas where hair is shaved frequently. An early sign of folliculitis is a  small, white or yellow, pus-filled, itchy lesion (pustule). These lesions appear on a red, inflamed follicle. They are usually less than 0.2 inches (5 mm) wide. When there is an infection of the follicle that goes deeper, it becomes a boil or furuncle. A group of closely packed boils creates a larger lesion (carbuncle). Carbuncles tend to occur in hairy, sweaty areas of the body. DIAGNOSIS  Your caregiver can usually tell what is wrong by doing a physical exam. A sample may be taken from one of the lesions and tested in a lab. This can help determine what is causing your folliculitis. TREATMENT  Treatment may include: Applying warm compresses to the affected areas. Taking antibiotic medicines orally or applying them to the skin. Draining the lesions if they contain a large amount of pus or fluid. Laser hair removal for cases of long-lasting folliculitis. This helps to prevent regrowth of the hair. HOME CARE INSTRUCTIONS Apply warm compresses to the affected areas as directed by your caregiver. If antibiotics are prescribed, take them as directed. Finish them even if you start to feel better. You may take over-the-counter medicines to relieve itching. Do not shave irritated skin. Follow up with your caregiver as directed. SEEK IMMEDIATE MEDICAL CARE IF:  You have increasing redness, swelling, or pain in the affected area. You have a fever. MAKE SURE YOU: Understand these instructions. Will watch your condition. Will get help right away if you are not doing well or get worse.   This information is not intended to replace advice given to you by your health care provider. Make sure you discuss any questions you have with your health care provider.   Document Released: 11/12/2001 Document Revised: 09/24/2014 Document Reviewed: 12/04/2011 Elsevier Interactive Patient Education [employer].

## 2015-12-06 NOTE — ED Notes (Signed)
Declined W/C at D/C and was escorted to lobby by RN. 

## 2015-12-06 NOTE — ED Provider Notes (Signed)
CSN: [id number]     Arrival date & time 12/06/15  1313 History  By signing my name below, I, Evelene Croon, attest that this documentation has been prepared under the direction and in the presence of non-physician practitioner, Margarita Mail, PA-C. Electronically Signed: Evelene Croon, Scribe. 12/06/2015. 3:12 PM.  Chief Complaint  Patient presents with  . Herpes Zoster    The history is provided by the patient. No language interpreter was used.     HPI Comments:  [name] is a 82 y.o. male who presents to the Emergency Department complaining of painful blistering to his upper abdomen and back which began ~3 months ago.  Pt was diagnosed with shingles on Jan. 14th 2017 and was placed on valtrex, prednisone and norco which he completed. He was also placed on hydromorphone, hydroxazine and lyrica by his PCP but states he is still having pain to the site. Pt has no new complaints or symptoms at this time. He denies fever.   Past Medical History  Diagnosis Date  . Syncope     resolved s/p PPM  . Macular degeneration (senile) of retina   . CAD (coronary artery disease) 09/2008  . Hypertension   . Persistent atrial fibrillation ([place])   . Diastolic dysfunction     hx of chf due to fluid overload 9/12   . Sick sinus syndrome (HCC)     St. Jude  . GERD (gastroesophageal reflux disease)   . Cancer ([place])     basal cell carcinoma  . Complication of anesthesia     06/12/11 anesthesia made legs asleep also and went into chf and ended up in ICU after gallbladder surgery  . PONV (postoperative nausea and vomiting)   . CHF (congestive heart failure) ([place])   . Shortness of breath dyspnea     exertion  . Dysrhythmia     atrial fibrillation  . Presence of permanent cardiac pacemaker 2010  . History of blood product transfusion     d/t bleeding ulcer   Past Surgical History  Procedure Laterality Date  . Right inguinal hernia repair  11/19/2002    with mesh  . Umblical hernia  123XX123   . Perforated ulcer    . Laparoscopic cholecystectomy w/ cholangiography  06/12/2011  . Cholecystectomy  06/12/11  . Pacemaker insertion  09/28/08    SJM Zephyr XL DR  . Transurethral resection of prostate  09/06/2011  . Transurethral resection of bladder tumor  09/06/2011    Procedure: TRANSURETHRAL RESECTION OF BLADDER TUMOR (TURBT);  Surgeon: Ailene Rud, MD;  Location: [facility];  Service: Urology;  Laterality: N/A;  . Inguinal hernia repair Left 10/06/2014    Procedure: OPEN REPAIR LEFT INGUINAL HERNIA ;  Surgeon: Fanny Skates, MD;  Location: [place];  Service: General;  Laterality: Left;  . Insertion of mesh Left 10/06/2014    Procedure: INSERTION OF MESH;  Surgeon: Fanny Skates, MD;  Location: [facility];  Service: General;  Laterality: Left;   Family History  Problem Relation Age of Onset  . Heart attack Mother 63    MI  . Heart failure Father   . Heart attack Brother     Late 24s  . Cancer Sister    Social History  Substance Use Topics  . Smoking status: Former Smoker    Quit date: 09/17/1958  . Smokeless tobacco: Never Used     Comment: quit 1968  . Alcohol Use: No    Review of Systems  Constitutional: Negative for  fever.  Skin: Positive for rash.   Allergies  Review of patient's allergies indicates no known allergies.  Home Medications   Prior to Admission medications   Medication Sig Start Date End Date Taking? Authorizing Provider  apixaban (ELIQUIS) 5 MG TABS tablet Take 1 tablet (5 mg total) by mouth 2 (two) times daily. 04/22/15   Thompson Grayer, MD  Cyanocobalamin (VITAMIN B-12 CR PO) Take 1 tablet by mouth once a week.     Historical Provider, MD  HYDROcodone-acetaminophen (NORCO/VICODIN) 5-325 MG tablet Take 1-2 tablets by mouth every 6 (six) hours as needed. 10/01/15   Stevi Barrett, PA-C  omeprazole (PRILOSEC) 20 MG capsule Take 20 mg by mouth daily.     Historical Provider, MD  predniSONE (DELTASONE) 10 MG tablet Take 1 tablet (10 mg total) by mouth  daily. Take 40 mg (2 tablets in the morning, 2 tablets at night) for 3 days. Then take 30 mg (2 tablets in morning, 1 at night) for 3 days. Then take 20 mg (1 tablet in morning, 1 tablet at night) for 3 days. Then take 10 mg (1 tablet in the morning) for 3 days. Total treatment duration of 12 days. 10/01/15   Stevi Barrett, PA-C   BP 115/72 mmHg  Pulse 57  Temp(Src) 98.1 F (36.7 C) (Oral)  Resp 18  SpO2 95% Physical Exam  Constitutional: He is oriented to person, place, and time. He appears well-developed and well-nourished. No distress.  HENT:  Head: Normocephalic and atraumatic.  Eyes: Conjunctivae are normal.  Cardiovascular: Normal rate.   Pulmonary/Chest: Effort normal.  Abdominal: He exhibits no distension.  Neurological: He is alert and oriented to person, place, and time.  Skin: Skin is warm and dry.  Scarring in the left posterior thorax  Scaling and crusting to anterior thoracic cage  Small pustule formation c/w folliculitis [id number] dermatone  Psychiatric: He has a normal mood and affect.  Nursing note and vitals reviewed.   ED Course  Procedures   DIAGNOSTIC STUDIES:  Oxygen Saturation is 95% on RA, normal by my interpretation.    COORDINATION OF CARE:  3:07 PM Discussed treatment plan with pt at bedside and pt agreed to plan.   MDM   Patient with postherpetic neuralgia and folliculitis.  Will treat with bactroban ointment and immediate release morphine. No signs of secondary infection. No signs of disseminated herpes. Follow up with PCP in 2-3 days. Return precautions discussed. Pt is safe for discharge at this time.   Final diagnoses:  None    Patient appears to have some secondary folliculitis over his healing shingles. Feel he is dealing with postherpetic neuralgia. Patient did not like the way he felt with the hydromorphone given by his PCP. Patient will be changed. Morphine immediate release tablets, which have lowest risk for associated altered  awareness. Topical Bactroban for folliculitis. Patient appears safe for discharge at this time. He'll need to follow closely with his primary care physician.  I personally performed the services described in this documentation, which was scribed in my presence. The recorded information has been reviewed and is accurate.     Margarita Mail, PA-C 12/07/15 1702  Leo Grosser, MD 12/07/15 2231

## 2015-12-06 NOTE — ED Provider Notes (Signed)
Medical screening examination/treatment/procedure(s) were conducted as a shared visit with non-physician practitioner(s) and myself.  I personally evaluated the patient during the encounter.   EKG Interpretation None     Pt with symptoms of post-herpetic neuralgia and what appears to be a mild secondary folliculitis over well healed shingles outbreak. Topical ABx for rash. Having difficulty with side effects from PO dilaudid, will try MS-IR and close f/u with PCP.   See related encounter note   Leo Grosser, MD 12/07/15 2231

## 2015-12-14 ENCOUNTER — Ambulatory Visit: Payer: Medicare Other | Admitting: Podiatry

## 2015-12-23 ENCOUNTER — Encounter (HOSPITAL_COMMUNITY): Payer: Self-pay | Admitting: Emergency Medicine

## 2015-12-23 ENCOUNTER — Emergency Department (HOSPITAL_COMMUNITY)
Admission: EM | Admit: 2015-12-23 | Discharge: 2015-12-23 | Disposition: A | Discharge status: Home / Self Care | Payer: Medicare Other | Attending: Emergency Medicine | Admitting: Emergency Medicine

## 2015-12-23 ENCOUNTER — Emergency Department (HOSPITAL_COMMUNITY): Payer: Medicare Other

## 2015-12-23 DIAGNOSIS — R42 Dizziness and giddiness: Secondary | ICD-10-CM | POA: Insufficient documentation

## 2015-12-23 DIAGNOSIS — T50905A Adverse effect of unspecified drugs, medicaments and biological substances, initial encounter: Secondary | ICD-10-CM

## 2015-12-23 DIAGNOSIS — I2510 Atherosclerotic heart disease of native coronary artery without angina pectoris: Secondary | ICD-10-CM | POA: Diagnosis not present

## 2015-12-23 DIAGNOSIS — I10 Essential (primary) hypertension: Secondary | ICD-10-CM | POA: Insufficient documentation

## 2015-12-23 DIAGNOSIS — Z950 Presence of cardiac pacemaker: Secondary | ICD-10-CM | POA: Diagnosis not present

## 2015-12-23 DIAGNOSIS — I509 Heart failure, unspecified: Secondary | ICD-10-CM | POA: Diagnosis not present

## 2015-12-23 DIAGNOSIS — Z8669 Personal history of other diseases of the nervous system and sense organs: Secondary | ICD-10-CM | POA: Diagnosis not present

## 2015-12-23 DIAGNOSIS — K219 Gastro-esophageal reflux disease without esophagitis: Secondary | ICD-10-CM | POA: Insufficient documentation

## 2015-12-23 DIAGNOSIS — Z79899 Other long term (current) drug therapy: Secondary | ICD-10-CM | POA: Insufficient documentation

## 2015-12-23 DIAGNOSIS — I481 Persistent atrial fibrillation: Secondary | ICD-10-CM | POA: Insufficient documentation

## 2015-12-23 DIAGNOSIS — Z7901 Long term (current) use of anticoagulants: Secondary | ICD-10-CM | POA: Insufficient documentation

## 2015-12-23 DIAGNOSIS — T40605A Adverse effect of unspecified narcotics, initial encounter: Secondary | ICD-10-CM | POA: Diagnosis not present

## 2015-12-23 DIAGNOSIS — R531 Weakness: Secondary | ICD-10-CM | POA: Diagnosis present

## 2015-12-23 DIAGNOSIS — Z85828 Personal history of other malignant neoplasm of skin: Secondary | ICD-10-CM | POA: Diagnosis not present

## 2015-12-23 DIAGNOSIS — R251 Tremor, unspecified: Secondary | ICD-10-CM | POA: Diagnosis not present

## 2015-12-23 LAB — URINALYSIS, ROUTINE W REFLEX MICROSCOPIC
BILIRUBIN URINE: NEGATIVE
Glucose, UA: NEGATIVE mg/dL
Hgb urine dipstick: NEGATIVE
KETONES UR: NEGATIVE mg/dL
Leukocytes, UA: NEGATIVE
NITRITE: NEGATIVE
Protein, ur: NEGATIVE mg/dL
Specific Gravity, Urine: 1.021 (ref 1.005–1.030)
pH: 6.5 (ref 5.0–8.0)

## 2015-12-23 MED ORDER — ACETAMINOPHEN 500 MG PO TABS
1000.0000 mg | ORAL_TABLET | Freq: Once | ORAL | Status: AC
  Administered 2015-12-23: 1000 mg via ORAL
  Filled 2015-12-23: qty 2

## 2015-12-23 NOTE — ED Notes (Signed)
Dr. Oni at the bedside.  

## 2015-12-23 NOTE — ED Provider Notes (Signed)
CSN: [id number]     Arrival date & time 12/23/15  0443 History   First MD Initiated Contact with Patient 12/23/15 0454     Chief Complaint  Patient presents with  . Weakness     (Consider location/radiation/quality/duration/timing/severity/associated sxs/prior Treatment) HPI   [name] is a 82 y.o. male with past medical history of sick sinus syndrome status post pacemaker placement, CHF, atrial fibrillation, presenting today with dizziness and weakness. He is also had the shaking chills. Patient states he has been taking narcotic pain medication since January for his shingles outbreak. He also develop postherpetic neuralgia. He did not like the way the narcotic medication was making him feel as if he was floating. He stopped taking this medication abruptly approximately 3 days ago. He since has experienced shaking chills, dizziness, and nausea. He's had normal appetite during the interval. Patient has no further complaints.  10 Systems reviewed and are negative for acute change except as noted in the HPI.     Past Medical History  Diagnosis Date  . Syncope     resolved s/p PPM  . Macular degeneration (senile) of retina   . CAD (coronary artery disease) 09/2008  . Hypertension   . Persistent atrial fibrillation ([place])   . Diastolic dysfunction     hx of chf due to fluid overload 9/12   . Sick sinus syndrome (HCC)     St. Jude  . GERD (gastroesophageal reflux disease)   . Cancer ([place])     basal cell carcinoma  . Complication of anesthesia     06/12/11 anesthesia made legs asleep also and went into chf and ended up in ICU after gallbladder surgery  . PONV (postoperative nausea and vomiting)   . CHF (congestive heart failure) ([place])   . Shortness of breath dyspnea     exertion  . Dysrhythmia     atrial fibrillation  . Presence of permanent cardiac pacemaker 2010  . History of blood product transfusion     d/t bleeding ulcer   Past Surgical History  Procedure Laterality  Date  . Right inguinal hernia repair  11/19/2002    with mesh  . Umblical hernia  123XX123  . Perforated ulcer    . Laparoscopic cholecystectomy w/ cholangiography  06/12/2011  . Cholecystectomy  06/12/11  . Pacemaker insertion  09/28/08    SJM Zephyr XL DR  . Transurethral resection of prostate  09/06/2011  . Transurethral resection of bladder tumor  09/06/2011    Procedure: TRANSURETHRAL RESECTION OF BLADDER TUMOR (TURBT);  Surgeon: Ailene Rud, MD;  [facility];  Service: Urology;  Laterality: N/A;  . Inguinal hernia repair Left 10/06/2014    Procedure: OPEN REPAIR LEFT INGUINAL HERNIA ;  Surgeon: Fanny Skates, MD;  Location: [place];  Service: General;  Laterality: Left;  . Insertion of mesh Left 10/06/2014    Procedure: INSERTION OF MESH;  Surgeon: Fanny Skates, MD;  Location: [facility];  Service: General;  Laterality: Left;   Family History  Problem Relation Age of Onset  . Heart attack Mother 54    MI  . Heart failure Father   . Heart attack Brother     Late 47s  . Cancer Sister    Social History  Substance Use Topics  . Smoking status: Former Smoker    Quit date: 09/17/1958  . Smokeless tobacco: Never Used     Comment: quit 1968  . Alcohol Use: No    Review of Systems  Allergies  Review of patient's allergies indicates no known allergies.  Home Medications   Prior to Admission medications   Medication Sig Start Date End Date Taking? Authorizing Provider  apixaban (ELIQUIS) 5 MG TABS tablet Take 1 tablet (5 mg total) by mouth 2 (two) times daily. 04/22/15  Yes Thompson Grayer, MD  Cyanocobalamin (VITAMIN B-12 CR PO) Take 1 tablet by mouth once a week.    Yes Historical Provider, MD  omeprazole (PRILOSEC) 20 MG capsule Take 20 mg by mouth daily.    Yes Historical Provider, MD  morphine (MSIR) 30 MG tablet Take 0.5-1 tablets (15-30 mg total) by mouth every 6 (six) hours as needed for severe pain. Patient not taking: Reported on 12/23/2015 12/06/15   Margarita Mail, PA-C  mupirocin cream (BACTROBAN) 2 % Apply a 1/2 inch strip to the area with the rash on the chest 2 times a day for 1 week. Patient not taking: Reported on 12/23/2015 12/06/15   Margarita Mail, PA-C  predniSONE (DELTASONE) 10 MG tablet Take 1 tablet (10 mg total) by mouth daily. Take 40 mg (2 tablets in the morning, 2 tablets at night) for 3 days. Then take 30 mg (2 tablets in morning, 1 at night) for 3 days. Then take 20 mg (1 tablet in morning, 1 tablet at night) for 3 days. Then take 10 mg (1 tablet in the morning) for 3 days. Total treatment duration of 12 days. Patient not taking: Reported on 12/23/2015 10/01/15   Stevi Barrett, PA-C   BP 150/83 mmHg  Pulse 60  Temp(Src) 97.8 F (36.6 C) (Oral)  Resp 21  Ht 5\' 9"  (1.753 m)  Wt 165 lb (74.844 kg)  BMI 24.36 kg/m2  SpO2 96% Physical Exam  Constitutional: He is oriented to person, place, and time. Vital signs are normal. He appears well-developed and well-nourished.  Non-toxic appearance. He does not appear ill. No distress.  HENT:  Head: Normocephalic and atraumatic.  Nose: Nose normal.  Mouth/Throat: Oropharynx is clear and moist. No oropharyngeal exudate.  Eyes: Conjunctivae and EOM are normal. Pupils are equal, round, and reactive to light. No scleral icterus.  Neck: Normal range of motion. Neck supple. No tracheal deviation, no edema, no erythema and normal range of motion present. No thyroid mass and no thyromegaly present.  Cardiovascular: Normal rate, regular rhythm, S1 normal, S2 normal, normal heart sounds, intact distal pulses and normal pulses.  Exam reveals no gallop and no friction rub.   No murmur heard. Pulmonary/Chest: Effort normal and breath sounds normal. No respiratory distress. He has no wheezes. He has no rhonchi. He has no rales.  Pacemaker in left chest wall  Abdominal: Soft. Normal appearance and bowel sounds are normal. He exhibits no distension, no ascites and no mass. There is no hepatosplenomegaly. There  is no tenderness. There is no rebound, no guarding and no CVA tenderness.  Musculoskeletal: Normal range of motion. He exhibits no edema or tenderness.  Lymphadenopathy:    He has no cervical adenopathy.  Neurological: He is alert and oriented to person, place, and time. He has normal strength. No cranial nerve deficit or sensory deficit.  Normal strength and sensation in all activities. Normal cerebellar testing.  Skin: Skin is warm, dry and intact. No petechiae and no rash noted. He is not diaphoretic. No erythema. No pallor.  Psychiatric: He has a normal mood and affect. His behavior is normal. Judgment normal.  Nursing note and vitals reviewed.   ED Course  Procedures (including critical  care time) Labs Review Labs Reviewed  URINALYSIS, ROUTINE W REFLEX MICROSCOPIC (NOT AT [facility])    Imaging Review Dg Chest 2 View  12/23/2015  CLINICAL DATA:  Dizziness and weakness tonight. History of hypertension, CHF, cancer. EXAM: CHEST  2 VIEW COMPARISON:  Chest radiograph December 01, 2014 and CT chest June 15, 2011 FINDINGS: Cardiac silhouette remains upper limits of normal in size, mediastinal silhouette is nonsuspicious. Apical bullous changes and similar chronic interstitial changes with pleural thickening RIGHT lung base. Increased lung volumes with flattened hemidiaphragms. No pleural effusion or focal consolidation. No pneumothorax. Old RIGHT lateral rib fractures. Dual lead LEFT cardiac pacemaker in situ. Surgical clips in the included right abdomen compatible with cholecystectomy. IMPRESSION: Borderline cardiomegaly and COPD.  No acute pulmonary process. Electronically Signed   By: Elon Alas M.D.   On: 12/23/2015 05:34   I have personally reviewed and evaluated these images and lab results as part of my medical decision-making.   EKG Interpretation None      MDM   Final diagnoses:  None  Patient presents to emergency department for dizziness and shaking chills. This is likely  withdrawal effect from his narcotic pain medication. It is now been 3 days, he is advised to continue to not take this medication. He was advised on using Tylenol only for pain. Patient images good understanding of the plan. Primary care follow-up advised within 3 days. Will obtain chest x-ray and urinalysis to evaluate for any infection causing rigors. Vital signs remain within his normal limits.   6:50 AM Chest x-ray and urinalysis did not reveal any infection. Patient appears well and in no acute distress, he is safe for discharge.  Everlene Balls, MD 12/23/15 [phone]

## 2015-12-23 NOTE — Discharge Instructions (Signed)
Drug Toxicity [name], your symptoms are likely due to abruptly stopping the narcotic pain medication after being on it for 3 months.  Do not take anymore narcotic pain medication and take tylenol as needed for your pain.  These symptoms should continue to improve.  See your primary care doctor within 3 days for close follow up.  If symptoms worsen, come back to the ED immediately. Thank you. Drug toxicity refers to harmful and unwanted (adverse) effects of a drug in your body. Drug toxicity often results from taking too much of a drug (overdose) by accident or on purpose. With some drugs, there is only a small difference between the dose that is needed to treat your condition and a dose that is harmful (narrow therapeutic range). However, any drug can be toxic at high doses, and even normal doses of certain drugs can be toxic for some people. These include over-the-counter (OTC) medicines. Drug toxicity can happen suddenly when you first start taking a drug or when you suddenly take too much of a drug (acute toxicity). It can also happen as a result of taking a drug for a long period of time (chronic toxicity). The effects of drug toxicity can be mild, dangerous, or even deadly. CAUSES Many things can cause drug toxicity. Common causes of acute toxicity include a drug overdose or an allergic reaction to a drug. Most drugs are broken down (metabolized) by your liver and eliminated (excreted) by your kidneys. Chronic drug toxicity can result from changes in the way that your body metabolizes a drug. This can happen, for example, if you weigh less than you did when you started taking a drug but you keep taking the same dose that you took at the heavier weight. RISK FACTORS You may have a higher risk for drug toxicity if you:  Are under 53 years of age or over 71 years of age.  Have liver disease, kidney disease, or another medical condition.  Are taking more than one drug.  Are pregnant.  Are  allergic to certain drugs.  Have genes that cause you to be more affected by (susceptible to) certain drugs.  Take a drug that has a narrow therapeutic range. Certain types of drugs are more likely than others to cause toxicity. Many drugs have a narrow therapeutic range, including:  Blood thinners.  Heart medicines.  Diabetes medicines.  Medicines to prevent or stop seizures.  Theophylline for asthma.  Lithium for bipolar disorder. SYMPTOMS Signs and symptoms of drug toxicity depend on the drug and the amount that was taken. They may start suddenly or develop gradually over time. DIAGNOSIS Drug toxicity may be diagnosed based on your symptoms. Some drugs have known side effects that suggest toxicity. It is important that you tell health care provider about all of the drugs that you are taking and whether you have ever had a reaction to a drug. Your health care provider will do a physical exam. You may have tests to check for drug toxicity, including:  Blood tests to measure the amount of the drug in your blood or to check for signs of kidney or liver damage.  Urine tests.  Other tests to check for organ damage. TREATMENT Treatment may include:  Stopping the drug.  Lowering the dose of the drug.  Switching to a different drug. You may also need treatment to stop or reverse the effects of the toxicity. These treatments depend on the drug that caused the toxicity, how severe the toxicity is, and  which parts of your body are affected. HOME CARE INSTRUCTIONS  Take medicines only as directed by your health care provider. Always ask your health care provider to discuss the possible side effects of any new drug that you start taking.  Keep a list of all of the drugs that you take, including over-the-counter medicines. Bring this list with you to all of your medical visits.  Read the drug inserts that come with your medicines.  Keep all follow-up visits as directed by your  health care provider. This is important. SEEK MEDICAL CARE IF:  Your symptoms return.  You develop any new signs or symptoms when you are taking medicines.  You notice any signs that indicate that you are taking too much of your medicine, based on what your health care provider told you to watch for. SEEK IMMEDIATE MEDICAL CARE IF:  You have chest pain.  You have difficulty breathing.  You have a loss of consciousness.   This information is not intended to replace advice given to you by your health care provider. Make sure you discuss any questions you have with your health care provider.   Document Released: 09/03/2005 Document Revised: 01/18/2015 Document Reviewed: 09/08/2014 Elsevier Interactive Patient Education [employer].

## 2015-12-23 NOTE — ED Notes (Signed)
Per EMS, patient comes from home with complaints of dizziness when standing. bp 140/80, p 60 with pacemaker, o2 93% on room air. Patient alert and oriented on arrival

## 2016-01-19 ENCOUNTER — Other Ambulatory Visit: Payer: Self-pay | Admitting: Internal Medicine

## 2016-02-02 ENCOUNTER — Telehealth: Payer: Self-pay | Admitting: Internal Medicine

## 2016-02-02 NOTE — Telephone Encounter (Signed)
Returned Mrs. [name]' phone call.  She states she called [employer] after calling our office and they pushed his next TTM out until 05/2016 as the patient has an appointment with Chanetta Marshall, NP on 03/15/16.  [name] is appreciative of call back and denies additional questions at this time.

## 2016-02-02 NOTE — Telephone Encounter (Signed)
New message  Pt wife called believes that the patient should have a remote pacer check appt TODAY. Request a call back to discuss why it is not scheduled.

## 2016-03-14 NOTE — Progress Notes (Signed)
Electrophysiology Office Note Date: 03/15/2016  ID:  [name], DOB October 10, 1930, MRN [id number]  PCP: Dwan Bolt, MD Electrophysiologist: Rayann Heman  CC: Pacemaker follow-up  [name] is a 82 y.o. male seen today for Dr Rayann Heman.  He presents today for routine electrophysiology followup.  Since last being seen in our clinic, the patient reports doing reasonably well.  He has been recovering from shingles on his back.  He denies chest pain, palpitations, dyspnea, PND, orthopnea, nausea, vomiting, dizziness, syncope, edema, weight gain, or early satiety.  Device History: STJ dual chamber PPM implanted 2010 for symptomatic bradycardia    Past Medical History  Diagnosis Date  . Syncope     resolved s/p PPM  . Macular degeneration (senile) of retina   . CAD (coronary artery disease) 09/2008  . Hypertension   . Persistent atrial fibrillation ([place])   . Diastolic dysfunction     hx of chf due to fluid overload 9/12   . Sick sinus syndrome (HCC)     St. Jude  . GERD (gastroesophageal reflux disease)   . Cancer ([place])     basal cell carcinoma  . Complication of anesthesia     06/12/11 anesthesia made legs asleep also and went into chf and ended up in ICU after gallbladder surgery  . PONV (postoperative nausea and vomiting)   . CHF (congestive heart failure) ([place])   . Shortness of breath dyspnea     exertion  . Dysrhythmia     atrial fibrillation  . Presence of permanent cardiac pacemaker 2010  . History of blood product transfusion     d/t bleeding ulcer   Past Surgical History  Procedure Laterality Date  . Right inguinal hernia repair  11/19/2002    with mesh  . Umblical hernia  [id number]  . Perforated ulcer    . Laparoscopic cholecystectomy w/ cholangiography  06/12/2011  . Cholecystectomy  06/12/11  . Pacemaker insertion  09/28/08    SJM Zephyr XL DR  . Transurethral resection of prostate  09/06/2011  . Transurethral resection of bladder tumor  09/06/2011    Procedure: TRANSURETHRAL RESECTION OF BLADDER TUMOR (TURBT);  Surgeon: Ailene Rud, MD;  Location: [facility];  Service: Urology;  Laterality: N/A;  . Inguinal hernia repair Left 10/06/2014    Procedure: OPEN REPAIR LEFT INGUINAL HERNIA ;  Surgeon: Fanny Skates, MD;  Location: [facility];  Service: General;  Laterality: Left;  . Insertion of mesh Left 10/06/2014    Procedure: INSERTION OF MESH;  Surgeon: Fanny Skates, MD;  Location: [facility];  Service: General;  Laterality: Left;    Current Outpatient Prescriptions  Medication Sig Dispense Refill  . apixaban (ELIQUIS) 5 MG TABS tablet Take 1 tablet (5 mg total) by mouth 2 (two) times daily. 180 tablet 0  . Cyanocobalamin (VITAMIN B-12 CR PO) Take 1 tablet by mouth once a week.     [name] omeprazole (PRILOSEC) 20 MG capsule Take 20 mg by mouth daily.      No current facility-administered medications for this visit.    Allergies:   Review of patient's allergies indicates no known allergies.   Social History: Social History   Social History  . Marital Status: Married    Spouse Name: N/A  . Number of Children: N/A  . Years of Education: N/A   Occupational History  . Not on file.   Social History Main Topics  . Smoking status: Former Smoker    Quit date: 09/17/1958  . Smokeless  tobacco: Never Used     Comment: quit 1968  . Alcohol Use: No  . Drug Use: No  . Sexual Activity: Not on file   Other Topics Concern  . Not on file   Social History Narrative    Family History: Family History  Problem Relation Age of Onset  . Heart attack Mother 84    MI  . Heart failure Father   . Heart attack Brother     Late 49s  . Cancer Sister      Review of Systems: All other systems reviewed and are otherwise negative except as noted above.   Physical Exam: VS:  BP 130/84 mmHg  Pulse 63  Ht 5\' 9"  (1.753 m)  Wt 167 lb 9.6 oz (76.023 kg)  BMI 24.74 kg/m2  SpO2 97% , BMI Body mass index is 24.74 kg/(m^2).  GEN- The patient is  elderly appearing, alert and oriented x 3 today.   HEENT: normocephalic, atraumatic; sclera clear, conjunctiva pink; hearing intact; oropharynx clear; neck supple  Lungs- Clear to ausculation bilaterally, normal work of breathing.  No wheezes, rales, rhonchi Heart- Regular rate and rhythm (paced) GI- soft, non-tender, non-distended, bowel sounds present  Extremities- no clubbing, cyanosis, or edema; DP/PT/radial pulses 2+ bilaterally MS- no significant deformity or atrophy Skin- warm and dry, no rash or lesion; PPM pocket well healed Psych- euthymic mood, full affect Neuro- strength and sensation are intact  PPM Interrogation- reviewed in detail today,  See PACEART report  EKG:  EKG is not ordered today.  Recent Labs: 10/01/2015: BUN 19; Creatinine, Ser 1.03; Hemoglobin 15.8; Platelets 181; Potassium 4.3; Sodium 139   Wt Readings from Last 3 Encounters:  03/15/16 167 lb 9.6 oz (76.023 kg)  12/23/15 165 lb (74.844 kg)  10/01/15 175 lb 5 oz (79.521 kg)     Other studies Reviewed: Additional studies/ records that were reviewed today include: Dr Jackalyn Lombard office notes  Assessment and Plan:  1.  Symptomatic bradycardia Normal PPM function See Pace Art report No changes today  2.  Permanent atrial fibrillation Continue Eliquis for CHADS2VASC of 4 V rates controlled CBC, BMET recently checked by PCP per patient  3.  HTN Stable No change required today   Current medicines are reviewed at length with the patient today.   The patient does not have concerns regarding his medicines.  The following changes were made today:  none  Labs/ tests ordered today include: none  Disposition:   Follow up with TTM's,  Dr Rayann Heman in 1 year      Signed, Chanetta Marshall, NP 03/15/2016 8:49 AM  [facility] [address] [place] [address] [phone] (office) [phone] (fax)

## 2016-03-15 ENCOUNTER — Encounter: Payer: Self-pay | Admitting: Nurse Practitioner

## 2016-03-15 ENCOUNTER — Ambulatory Visit (INDEPENDENT_AMBULATORY_CARE_PROVIDER_SITE_OTHER): Payer: Medicare Other | Admitting: Nurse Practitioner

## 2016-03-15 ENCOUNTER — Encounter: Payer: Self-pay | Admitting: Internal Medicine

## 2016-03-15 VITALS — BP 130/84 | HR 63 | Ht 69.0 in | Wt 167.6 lb

## 2016-03-15 DIAGNOSIS — R001 Bradycardia, unspecified: Secondary | ICD-10-CM | POA: Diagnosis not present

## 2016-03-15 DIAGNOSIS — I10 Essential (primary) hypertension: Secondary | ICD-10-CM | POA: Diagnosis not present

## 2016-03-15 DIAGNOSIS — I482 Chronic atrial fibrillation: Secondary | ICD-10-CM | POA: Diagnosis not present

## 2016-03-15 DIAGNOSIS — I4821 Permanent atrial fibrillation: Secondary | ICD-10-CM

## 2016-03-15 LAB — CUP PACEART INCLINIC DEVICE CHECK
Date Time Interrogation Session: 20170629092722
Implantable Lead Implant Date: 20100112
Implantable Lead Location: 753859
MDC IDC LEAD IMPLANT DT: 20100112
MDC IDC LEAD LOCATION: 753860
Pulse Gen Serial Number: 1284954

## 2016-03-15 NOTE — Patient Instructions (Addendum)
Medication Instructions:   Your physician recommends that you continue on your current medications as directed. Please refer to the Current Medication list given to you today.   If you need a refill on your cardiac medications before your next appointment, please call your pharmacy.  Labwork: NONE ORDER TODAY    Testing/Procedures: NONE ORDER TODAY    Follow-Up:. CONTINUE WITH YOUR HOME MONITORING SERVICE FOR YOUR DEVICE   Your physician wants you to follow-up in: [place] will receive a reminder letter in the mail two months in advance. If you don't receive a letter, please call our office to schedule the follow-up appointment.   Any Other Special Instructions Will Be Listed Below (If Applicable).

## 2016-04-19 ENCOUNTER — Other Ambulatory Visit: Payer: Self-pay | Admitting: Internal Medicine

## 2016-05-16 ENCOUNTER — Ambulatory Visit (INDEPENDENT_AMBULATORY_CARE_PROVIDER_SITE_OTHER): Payer: Medicare Other | Admitting: Podiatry

## 2016-05-16 ENCOUNTER — Encounter: Payer: Self-pay | Admitting: Podiatry

## 2016-05-16 DIAGNOSIS — B351 Tinea unguium: Secondary | ICD-10-CM

## 2016-05-16 DIAGNOSIS — M79676 Pain in unspecified toe(s): Secondary | ICD-10-CM

## 2016-05-16 NOTE — Progress Notes (Signed)
Patient ID: [name], male   DOB: 11-01-1930, 82 y.o.   MRN: [id number]    Subjective: This patient presents today complaining of painful toenails and walking wearing shoes and requests toenail debridement. Also, patient request evaluation for what he believes is a pigmented skin lesion on the plantar aspect of right and left feet for several years. He does not describe any change in texture and size.  Objective: The toenails are extremely elongated, incurvated, discolored, deformed and tender direct palpation 6-10 The plantar aspect the right and left feet demonstrate no evidence of pigmented lesions, bilaterally Plantar left mid foot has punctate keratoses in the skin fold Varicosities ankles bilaterally  Assessment: Symptomatic neglected onychomycoses 6-10 No evidence of pigmented lesions bilaterally Varicosities bilaterally  Plan: I advised patient that there is no evidence of pigmented lesions, bilaterally The toenails 10 were debrided mechanically and electrically without any bleeding  Reappoint at patient's request or 3-4 month intervals

## 2016-05-26 ENCOUNTER — Emergency Department (HOSPITAL_COMMUNITY): Payer: Medicare Other

## 2016-05-26 ENCOUNTER — Emergency Department (HOSPITAL_COMMUNITY)
Admission: EM | Admit: 2016-05-26 | Discharge: 2016-05-26 | Disposition: A | Discharge status: Home / Self Care | Payer: Medicare Other | Attending: Emergency Medicine | Admitting: Emergency Medicine

## 2016-05-26 ENCOUNTER — Encounter (HOSPITAL_COMMUNITY): Payer: Self-pay | Admitting: Emergency Medicine

## 2016-05-26 DIAGNOSIS — I110 Hypertensive heart disease with heart failure: Secondary | ICD-10-CM | POA: Diagnosis not present

## 2016-05-26 DIAGNOSIS — Z85828 Personal history of other malignant neoplasm of skin: Secondary | ICD-10-CM | POA: Insufficient documentation

## 2016-05-26 DIAGNOSIS — I5030 Unspecified diastolic (congestive) heart failure: Secondary | ICD-10-CM | POA: Insufficient documentation

## 2016-05-26 DIAGNOSIS — Z7901 Long term (current) use of anticoagulants: Secondary | ICD-10-CM | POA: Diagnosis not present

## 2016-05-26 DIAGNOSIS — Z950 Presence of cardiac pacemaker: Secondary | ICD-10-CM | POA: Insufficient documentation

## 2016-05-26 DIAGNOSIS — Z79899 Other long term (current) drug therapy: Secondary | ICD-10-CM | POA: Insufficient documentation

## 2016-05-26 DIAGNOSIS — R531 Weakness: Secondary | ICD-10-CM

## 2016-05-26 DIAGNOSIS — I2510 Atherosclerotic heart disease of native coronary artery without angina pectoris: Secondary | ICD-10-CM | POA: Diagnosis not present

## 2016-05-26 DIAGNOSIS — Z87891 Personal history of nicotine dependence: Secondary | ICD-10-CM | POA: Insufficient documentation

## 2016-05-26 LAB — COMPREHENSIVE METABOLIC PANEL
ALBUMIN: 3.4 g/dL — AB (ref 3.5–5.0)
ALT: 18 U/L (ref 17–63)
AST: 21 U/L (ref 15–41)
Alkaline Phosphatase: 73 U/L (ref 38–126)
Anion gap: 8 (ref 5–15)
BILIRUBIN TOTAL: 0.8 mg/dL (ref 0.3–1.2)
BUN: 14 mg/dL (ref 6–20)
CHLORIDE: 105 mmol/L (ref 101–111)
CO2: 27 mmol/L (ref 22–32)
CREATININE: 1.17 mg/dL (ref 0.61–1.24)
Calcium: 9 mg/dL (ref 8.9–10.3)
GFR calc Af Amer: >60 mL/min (ref >60)
GFR calc non Af Amer: 56 mL/min — ABNORMAL LOW (ref >60)
GLUCOSE: 110 mg/dL — AB (ref 65–99)
POTASSIUM: 3.9 mmol/L (ref 3.5–5.1)
Sodium: 140 mmol/L (ref 135–145)
Total Protein: 6.7 g/dL (ref 6.5–8.1)

## 2016-05-26 LAB — URINE MICROSCOPIC-ADD ON
BACTERIA UA: NONE SEEN
Squamous Epithelial / LPF: NONE SEEN
WBC, UA: NONE SEEN WBC/hpf (ref 0–5)

## 2016-05-26 LAB — CBC WITH DIFFERENTIAL/PLATELET
BASOS ABS: 0 10*3/uL (ref 0.0–0.1)
BASOS PCT: 0 %
Eosinophils Absolute: 0.1 10*3/uL (ref 0.0–0.7)
Eosinophils Relative: 1 %
HEMATOCRIT: 46.4 % (ref 39.0–52.0)
Hemoglobin: 15.3 g/dL (ref 13.0–17.0)
LYMPHS PCT: 21 %
Lymphs Abs: 1.8 10*3/uL (ref 0.7–4.0)
MCH: 31.4 pg (ref 26.0–34.0)
MCHC: 33 g/dL (ref 30.0–36.0)
MCV: 95.1 fL (ref 78.0–100.0)
MONO ABS: 1 10*3/uL (ref 0.1–1.0)
Monocytes Relative: 12 %
NEUTROS ABS: 5.5 10*3/uL (ref 1.7–7.7)
Neutrophils Relative %: 66 %
PLATELETS: 227 10*3/uL (ref 150–400)
RBC: 4.88 MIL/uL (ref 4.22–5.81)
RDW: 13.9 % (ref 11.5–15.5)
WBC: 8.4 10*3/uL (ref 4.0–10.5)

## 2016-05-26 LAB — URINALYSIS, ROUTINE W REFLEX MICROSCOPIC
BILIRUBIN URINE: NEGATIVE
GLUCOSE, UA: NEGATIVE mg/dL
HGB URINE DIPSTICK: NEGATIVE
Ketones, ur: NEGATIVE mg/dL
Nitrite: NEGATIVE
Protein, ur: NEGATIVE mg/dL
SPECIFIC GRAVITY, URINE: 1.025 (ref 1.005–1.030)
pH: 6 (ref 5.0–8.0)

## 2016-05-26 LAB — TROPONIN I: Troponin I: <0.03 ng/mL (ref <0.03)

## 2016-05-26 NOTE — ED Notes (Signed)
Patient transported to X-ray 

## 2016-05-26 NOTE — ED Provider Notes (Signed)
[facility] DEPT Provider Note   CSN: [id number] Arrival date & time: 05/26/16  1319     History   Chief Complaint Chief Complaint  Patient presents with  . Fall  . Arm Injury  . Extremity Weakness    HPI [name] is a 82 y.o. male.  The history is provided by the patient.  Patient states he has weakness in both his legs. He has had it for 3 years but his been worse over the last few weeks. States began after a complicated surgery. States he had a fall 3 days ago because of leg weakness. States it is on both legs with possibly worse on the right. He has also had shingles, located by postherpetic neuralgia. That was started back around 9 months ago. States he has been getting weaker since that happened also. No fevers or chills. No cough. No nausea vomiting. No headache. He did fall and has a skin tear on his left elbow but no elbow pain.  Past Medical History:  Diagnosis Date  . CAD (coronary artery disease) 09/2008  . Cancer ([place])    basal cell carcinoma  . CHF (congestive heart failure) ([place])   . Complication of anesthesia    06/12/11 anesthesia made legs asleep also and went into chf and ended up in ICU after gallbladder surgery  . Diastolic dysfunction    hx of chf due to fluid overload 9/12   . Dysrhythmia    atrial fibrillation  . GERD (gastroesophageal reflux disease)   . History of blood product transfusion    d/t bleeding ulcer  . Hypertension   . Macular degeneration (senile) of retina   . Persistent atrial fibrillation ([place])   . PONV (postoperative nausea and vomiting)   . Presence of permanent cardiac pacemaker 2010  . Shortness of breath dyspnea    exertion  . Sick sinus syndrome (H[facility])    St. Jude  . Syncope    resolved s/p PPM    Patient Active Problem List   Diagnosis Date Noted  . Left inguinal hernia 10/06/2014  . Shortness of breath 01/03/2014  . Pacemaker-St.Jude 07/09/2012  . Permanent atrial fibrillation ([place]) 10/20/2010  .  HYPERLIPIDEMIA-MIXED 10/26/2009  . MACULAR DEGENERATION OF RETINA UNSPECIFIED 12/28/2008  . CAD, NATIVE VESSEL 12/28/2008  . GERD 12/28/2008  . SYNCOPE 12/28/2008  . Hypertension 12/28/2008  . Sick sinus syndrome ([place]) 12/28/2008    Past Surgical History:  Procedure Laterality Date  . CHOLECYSTECTOMY  06/12/11  . INGUINAL HERNIA REPAIR Left 10/06/2014   Procedure: OPEN REPAIR LEFT INGUINAL HERNIA ;  Surgeon: Fanny Skates, MD;  Location: [facility];  Service: General;  Laterality: Left;  . INSERTION OF MESH Left 10/06/2014   Procedure: INSERTION OF MESH;  Surgeon: Fanny Skates, MD;  Location: [facility];  Service: General;  Laterality: Left;  . LAPAROSCOPIC CHOLECYSTECTOMY W/ CHOLANGIOGRAPHY  06/12/2011  . PACEMAKER INSERTION  09/28/08   SJM Zephyr XL DR  . perforated ulcer    . right inguinal hernia repair  11/19/2002   with mesh  . TRANSURETHRAL RESECTION OF BLADDER TUMOR  09/06/2011   Procedure: TRANSURETHRAL RESECTION OF BLADDER TUMOR (TURBT);  Surgeon: Ailene Rud, MD;  [facility];  Service: Urology;  Laterality: N/A;  . TRANSURETHRAL RESECTION OF PROSTATE  [id number]  . umblical hernia  [id number]       Home Medications    Prior to Admission medications   Medication Sig Start Date End Date Taking? Authorizing Provider  amitriptyline (ELAVIL)  10 MG tablet Take 10 mg by mouth at bedtime. 05/19/16  Yes Historical Provider, MD  apixaban (ELIQUIS) 5 MG TABS tablet Take 1 tablet (5 mg total) by mouth 2 (two) times daily. 04/19/16  Yes Thompson Grayer, MD  Cyanocobalamin (VITAMIN B-12 CR PO) Take 1 tablet by mouth once a week.    Yes Historical Provider, MD  hydrOXYzine (ATARAX/VISTARIL) 25 MG tablet Take 25 mg by mouth 2 (two) times daily as needed for itching.   Yes Historical Provider, MD  omeprazole (PRILOSEC) 20 MG capsule Take 20 mg by mouth daily.    Yes Historical Provider, MD  traMADol (ULTRAM) 50 MG tablet Take 50 mg by mouth every 6 (six) hours as needed for moderate  pain.   Yes Historical Provider, MD  triamcinolone cream (KENALOG) 0.1 % Apply 1 application topically daily as needed (for irritation).  05/23/16  Yes Historical Provider, MD    Family History Family History  Problem Relation Age of Onset  . Heart attack Mother 62    MI  . Heart failure Father   . Heart attack Brother     Late 71s  . Cancer Sister     Social History Social History  Substance Use Topics  . Smoking status: Former Smoker    Quit date: 09/17/1958  . Smokeless tobacco: Never Used     Comment: quit 1968  . Alcohol use No     Allergies   Review of patient's allergies indicates no known allergies.   Review of Systems Review of Systems  Constitutional: Negative for appetite change and fatigue.  HENT: Negative for trouble swallowing.   Respiratory: Negative for shortness of breath.   Gastrointestinal: Negative for abdominal pain.  Genitourinary: Negative for flank pain.  Musculoskeletal: Negative for back pain.  Skin: Negative for wound.  Neurological: Positive for weakness. Negative for light-headedness.  Psychiatric/Behavioral: Negative for behavioral problems.     Physical Exam Updated Vital Signs BP 155/97 (BP Location: Right Arm)   Pulse (!) 58   Temp 98.4 F (36.9 C) (Oral)   Resp 18   Wt 170 lb (77.1 kg)   SpO2 99%   BMI 25.10 kg/m   Physical Exam  Constitutional: He appears well-developed.  HENT:  Head: Atraumatic.  Eyes: EOM are normal.  Neck: Neck supple.  Cardiovascular: Normal rate.   Pulmonary/Chest: Effort normal.  Abdominal: Soft.  Musculoskeletal: Normal range of motion. He exhibits no edema.  Neurological: He is alert.  Good strength in plantar flexion extension on both feet. Good strength for straight leg raise bilaterally. Pulses intact both feet.  Skin: Skin is warm. Capillary refill takes less than 2 seconds.     ED Treatments / Results  Labs (all labs ordered are listed, but only abnormal results are displayed) Labs  Reviewed  COMPREHENSIVE METABOLIC PANEL - Abnormal; Notable for the following:       Result Value   Glucose, Bld 110 (*)    Albumin 3.4 (*)    GFR calc non Af Amer 56 (*)    All other components within normal limits  URINALYSIS, ROUTINE W REFLEX MICROSCOPIC (NOT AT [facility]) - Abnormal; Notable for the following:    APPearance CLOUDY (*)    Leukocytes, UA SMALL (*)    All other components within normal limits  CBC WITH DIFFERENTIAL/PLATELET  TROPONIN I  URINE MICROSCOPIC-ADD ON    EKG  EKG Interpretation  Date/Time:  Saturday May 26 2016 14:57:36 EDT Ventricular Rate:  60 PR Interval:  QRS Duration: 156 QT Interval:  470 QTC Calculation: 470 R Axis:   -101 Text Interpretation:  Atrial fibrillation RBBB and LAFB electronically paced Confirmed by Alvino Chapel  MD, Ovid Curd [phone]) on 05/26/2016 4:07:24 PM       Radiology Dg Chest 2 View  Result Date: 05/26/2016 CLINICAL DATA:  Patient with weakness for 3 days. EXAM: CHEST  2 VIEW COMPARISON:  Chest radiograph 12/23/2015 FINDINGS: Multi lead pacer apparatus overlies the left hemi thorax, leads are stable in position. Stable cardiac and mediastinal contours. No consolidative pulmonary opacities. Unchanged bilateral lower lung linear opacities. Old right rib fractures. No pleural effusion or pneumothorax. Thoracic spine degenerative changes. Upper abdominal surgical clips. IMPRESSION: No acute cardiopulmonary process.  Bibasilar scarring. Electronically Signed   By: Lovey Newcomer M.D.   On: 05/26/2016 15:54    Procedures Procedures (including critical care time)  Medications Ordered in ED Medications - No data to display   Initial Impression / Assessment and Plan / ED Course  I have reviewed the triage vital signs and the nursing notes.  Pertinent labs & imaging results that were available during my care of the patient were reviewed by me and considered in my medical decision making (see chart for details).  Clinical Course     Patient with generalized weakness particularly in legs. Exam reassuring lab work reassuring. Has been somewhat chronic. Doubt severe disease such as spinal cord lesion. Will discharge home to follow-up with his primary care doctor, Dr. Wilson Singer.  F .inal Clinical Impressions(s) / ED Diagnoses   Final diagnoses:  Weakness    New Prescriptions New Prescriptions   No medications on file     Davonna Belling, MD 05/26/16 1610

## 2016-05-26 NOTE — ED Triage Notes (Signed)
Pt. Stated, I fell on Wednesday and hurt my left elbow, I knocked the skin off and I've tried to bandage it myself.  I've had some weak legs.

## 2016-05-26 NOTE — ED Triage Notes (Signed)
Pt. Stated, I've had shingles since the middle of January.

## 2016-06-15 ENCOUNTER — Encounter: Payer: Self-pay | Admitting: Internal Medicine

## 2016-06-15 DIAGNOSIS — R001 Bradycardia, unspecified: Secondary | ICD-10-CM | POA: Diagnosis not present

## 2016-07-18 ENCOUNTER — Telehealth: Payer: Self-pay

## 2016-07-18 NOTE — Telephone Encounter (Signed)
Prior auth for Eliquis 5 mg submitted to [facility]. 

## 2016-08-02 ENCOUNTER — Emergency Department (HOSPITAL_COMMUNITY)
Admission: EM | Admit: 2016-08-02 | Discharge: 2016-08-02 | Disposition: A | Discharge status: Home / Self Care | Payer: Medicare Other | Attending: Emergency Medicine | Admitting: Emergency Medicine

## 2016-08-02 ENCOUNTER — Encounter (HOSPITAL_COMMUNITY): Payer: Self-pay

## 2016-08-02 DIAGNOSIS — M79604 Pain in right leg: Secondary | ICD-10-CM | POA: Diagnosis present

## 2016-08-02 DIAGNOSIS — W010XXA Fall on same level from slipping, tripping and stumbling without subsequent striking against object, initial encounter: Secondary | ICD-10-CM | POA: Insufficient documentation

## 2016-08-02 DIAGNOSIS — Z79899 Other long term (current) drug therapy: Secondary | ICD-10-CM | POA: Insufficient documentation

## 2016-08-02 DIAGNOSIS — I509 Heart failure, unspecified: Secondary | ICD-10-CM | POA: Diagnosis not present

## 2016-08-02 DIAGNOSIS — I110 Hypertensive heart disease with heart failure: Secondary | ICD-10-CM | POA: Insufficient documentation

## 2016-08-02 DIAGNOSIS — Y999 Unspecified external cause status: Secondary | ICD-10-CM | POA: Insufficient documentation

## 2016-08-02 DIAGNOSIS — I2510 Atherosclerotic heart disease of native coronary artery without angina pectoris: Secondary | ICD-10-CM | POA: Diagnosis not present

## 2016-08-02 DIAGNOSIS — Z950 Presence of cardiac pacemaker: Secondary | ICD-10-CM | POA: Diagnosis not present

## 2016-08-02 DIAGNOSIS — Z87891 Personal history of nicotine dependence: Secondary | ICD-10-CM | POA: Diagnosis not present

## 2016-08-02 DIAGNOSIS — Z7901 Long term (current) use of anticoagulants: Secondary | ICD-10-CM | POA: Insufficient documentation

## 2016-08-02 DIAGNOSIS — Y939 Activity, unspecified: Secondary | ICD-10-CM | POA: Insufficient documentation

## 2016-08-02 DIAGNOSIS — Y929 Unspecified place or not applicable: Secondary | ICD-10-CM | POA: Diagnosis not present

## 2016-08-02 DIAGNOSIS — D72828 Other elevated white blood cell count: Secondary | ICD-10-CM | POA: Diagnosis not present

## 2016-08-02 LAB — CBC WITH DIFFERENTIAL/PLATELET
BASOS ABS: 0 10*3/uL (ref 0.0–0.1)
BASOS PCT: 0 %
EOS ABS: 0 10*3/uL (ref 0.0–0.7)
EOS PCT: 0 %
HCT: 43.9 % (ref 39.0–52.0)
Hemoglobin: 14.6 g/dL (ref 13.0–17.0)
Lymphocytes Relative: 18 %
Lymphs Abs: 2.3 10*3/uL (ref 0.7–4.0)
MCH: 31.4 pg (ref 26.0–34.0)
MCHC: 33.3 g/dL (ref 30.0–36.0)
MCV: 94.4 fL (ref 78.0–100.0)
MONO ABS: 1.4 10*3/uL — AB (ref 0.1–1.0)
Monocytes Relative: 11 %
Neutro Abs: 9.1 10*3/uL — ABNORMAL HIGH (ref 1.7–7.7)
Neutrophils Relative %: 71 %
PLATELETS: 216 10*3/uL (ref 150–400)
RBC: 4.65 MIL/uL (ref 4.22–5.81)
RDW: 13.1 % (ref 11.5–15.5)
WBC: 12.8 10*3/uL — AB (ref 4.0–10.5)

## 2016-08-02 LAB — URINALYSIS, ROUTINE W REFLEX MICROSCOPIC
Bilirubin Urine: NEGATIVE
Glucose, UA: NEGATIVE mg/dL
HGB URINE DIPSTICK: NEGATIVE
Ketones, ur: NEGATIVE mg/dL
Leukocytes, UA: NEGATIVE
Nitrite: NEGATIVE
PH: 7.5 (ref 5.0–8.0)
Protein, ur: NEGATIVE mg/dL
SPECIFIC GRAVITY, URINE: 1.02 (ref 1.005–1.030)

## 2016-08-02 LAB — COMPREHENSIVE METABOLIC PANEL
ALBUMIN: 3.9 g/dL (ref 3.5–5.0)
ALT: 15 U/L — AB (ref 17–63)
AST: 22 U/L (ref 15–41)
Alkaline Phosphatase: 78 U/L (ref 38–126)
Anion gap: 6 (ref 5–15)
BUN: 16 mg/dL (ref 6–20)
CHLORIDE: 105 mmol/L (ref 101–111)
CO2: 26 mmol/L (ref 22–32)
CREATININE: 0.95 mg/dL (ref 0.61–1.24)
Calcium: 8.9 mg/dL (ref 8.9–10.3)
GFR calc Af Amer: >60 mL/min (ref >60)
GLUCOSE: 106 mg/dL — AB (ref 65–99)
POTASSIUM: 3.7 mmol/L (ref 3.5–5.1)
SODIUM: 137 mmol/L (ref 135–145)
Total Bilirubin: 1.2 mg/dL (ref 0.3–1.2)
Total Protein: 7.2 g/dL (ref 6.5–8.1)

## 2016-08-02 NOTE — ED Notes (Signed)
Patient ambulated in the hall very well.

## 2016-08-02 NOTE — ED Notes (Signed)
Dr Yelverton at bedside.  

## 2016-08-02 NOTE — ED Triage Notes (Signed)
Pt presents from home via EMS with c/o bilateral leg pain. Pt reported to EMS that he always has some sort of leg pain but that the pain is now worse and it feels like more of a "weakness". Pt reports he has seen several doctors r/t the pain but is unable to find out what is causing the pain.

## 2016-08-02 NOTE — ED Provider Notes (Signed)
[facility] DEPT Provider Note   CSN: [id number] Arrival date & time: 08/02/16  1719     History   Chief Complaint Chief Complaint  Patient presents with  . Leg Pain    HPI STEELE ZIMMERLY is a 82 y.o. male.  HPI Patient with chronic lower extremity pain after being diagnosed with shingles and then postherpetic neuralgia. Denies any current pain. States he was napping today. Tried to stand up but kept slipping. Patient was wearing socks and he was trying to stand on a wooden floor. He then slowly slumped to the floor. Denies any trauma. Was unable to get up off the floor. Wife and neighbor helped him back to bed. States he's been unable to ambulate since. Denies any fever or chills. Denies any numbness. Denies neck, thoracic or back pain. No headache, visual changes or speech changes. Past Medical History:  Diagnosis Date  . CAD (coronary artery disease) 09/2008  . Cancer ([place])    basal cell carcinoma  . CHF (congestive heart failure) ([place])   . Complication of anesthesia    06/12/11 anesthesia made legs asleep also and went into chf and ended up in ICU after gallbladder surgery  . Diastolic dysfunction    hx of chf due to fluid overload 9/12   . Dysrhythmia    atrial fibrillation  . GERD (gastroesophageal reflux disease)   . History of blood product transfusion    d/t bleeding ulcer  . Hypertension   . Macular degeneration (senile) of retina   . Persistent atrial fibrillation ([place])   . PONV (postoperative nausea and vomiting)   . Presence of permanent cardiac pacemaker 2010  . Shortness of breath dyspnea    exertion  . Sick sinus syndrome (HCC)    St. Jude  . Syncope    resolved s/p PPM    Patient Active Problem List   Diagnosis Date Noted  . Left inguinal hernia 10/06/2014  . Shortness of breath 01/03/2014  . Pacemaker-St.Jude 07/09/2012  . Permanent atrial fibrillation ([place]) 10/20/2010  . HYPERLIPIDEMIA-MIXED 10/26/2009  . MACULAR DEGENERATION OF RETINA  UNSPECIFIED 12/28/2008  . CAD, NATIVE VESSEL 12/28/2008  . GERD 12/28/2008  . SYNCOPE 12/28/2008  . Hypertension 12/28/2008  . Sick sinus syndrome ([place]) 12/28/2008    Past Surgical History:  Procedure Laterality Date  . CHOLECYSTECTOMY  06/12/11  . INGUINAL HERNIA REPAIR Left 10/06/2014   Procedure: OPEN REPAIR LEFT INGUINAL HERNIA ;  Surgeon: Fanny Skates, MD;  Location: [facility];  Service: General;  Laterality: Left;  . INSERTION OF MESH Left 10/06/2014   Procedure: INSERTION OF MESH;  Surgeon: Fanny Skates, MD;  Location: [facility];  Service: General;  Laterality: Left;  . LAPAROSCOPIC CHOLECYSTECTOMY W/ CHOLANGIOGRAPHY  06/12/2011  . PACEMAKER INSERTION  09/28/08   SJM Zephyr XL DR  . perforated ulcer    . right inguinal hernia repair  11/19/2002   with mesh  . TRANSURETHRAL RESECTION OF BLADDER TUMOR  09/06/2011   Procedure: TRANSURETHRAL RESECTION OF BLADDER TUMOR (TURBT);  Surgeon: Ailene Rud, MD;  Location: [facility];  Service: Urology;  Laterality: N/A;  . TRANSURETHRAL RESECTION OF PROSTATE  [id number]  . umblical hernia  [id number]       Home Medications    Prior to Admission medications   Medication Sig Start Date End Date Taking? Authorizing Provider  apixaban (ELIQUIS) 5 MG TABS tablet Take 1 tablet (5 mg total) by mouth 2 (two) times daily. 04/19/16  Yes Thompson Grayer, MD  Cyanocobalamin (VITAMIN  B-12 CR PO) Take 1 tablet by mouth once a week.    Yes Historical Provider, MD  hydrOXYzine (ATARAX/VISTARIL) 25 MG tablet Take 25 mg by mouth 2 (two) times daily as needed for itching.   Yes Historical Provider, MD  magnesium 30 MG tablet Take 30 mg by mouth every evening.   Yes Historical Provider, MD  omeprazole (PRILOSEC) 20 MG capsule Take 20 mg by mouth daily.    Yes Historical Provider, MD  triamcinolone cream (KENALOG) 0.1 % Apply 1 application topically daily as needed (for irritation).  05/23/16  Yes Historical Provider, MD  amitriptyline (ELAVIL) 10 MG tablet Take  10 mg by mouth at bedtime as needed for sleep.  05/19/16   Historical Provider, MD  traMADol (ULTRAM) 50 MG tablet Take 50 mg by mouth every 6 (six) hours as needed for moderate pain.    Historical Provider, MD    Family History Family History  Problem Relation Age of Onset  . Heart attack Mother 64    MI  . Heart failure Father   . Heart attack Brother     Late 24s  . Cancer Sister     Social History Social History  Substance Use Topics  . Smoking status: Former Smoker    Quit date: 09/17/1958  . Smokeless tobacco: Never Used     Comment: quit 1968  . Alcohol use No     Allergies   Patient has no known allergies.   Review of Systems Review of Systems  Constitutional: Negative for chills and fever.  Respiratory: Negative for cough and shortness of breath.   Cardiovascular: Negative for chest pain.  Gastrointestinal: Negative for abdominal pain, nausea and vomiting.  Musculoskeletal: Positive for gait problem. Negative for back pain, myalgias and neck pain.  Skin: Positive for rash. Negative for wound.  Neurological: Positive for weakness. Negative for dizziness, light-headedness, numbness and headaches.     Physical Exam Updated Vital Signs BP 118/80 (BP Location: Left Arm)   Pulse 62   Temp 97.8 F (36.6 C) (Oral)   Resp 16   Ht 5\' 9"  (1.753 m)   Wt 170 lb (77.1 kg)   SpO2 97%   BMI 25.10 kg/m   Physical Exam  Constitutional: He is oriented to person, place, and time. He appears well-developed and well-nourished.  HENT:  Head: Normocephalic and atraumatic.  Mouth/Throat: Oropharynx is clear and moist. No oropharyngeal exudate.  Eyes: EOM are normal. Pupils are equal, round, and reactive to light.  Neck: Normal range of motion. Neck supple.  No posterior midline cervical tenderness. No meningismus  Cardiovascular: Normal rate and regular rhythm.   Pulmonary/Chest: Effort normal and breath sounds normal. No respiratory distress. He has no wheezes. He has no  rales. He exhibits no tenderness.  Abdominal: Soft. Bowel sounds are normal. There is no tenderness. There is no rebound and no guarding.  Musculoskeletal: Normal range of motion. He exhibits no edema or tenderness.  2+ dorsalis pedis and the right. 1+ dorsalis pedis on the left. No lower extremity asymmetry, tenderness or swelling. Patient has no midline thoracic or lumbar tenderness. No CVA tenderness.  Neurological: He is alert and oriented to person, place, and time.  5/5 dorsiflexion and plantarflexion in both feet. 5/5 hip flexion bilaterally. 5/5 grip strength bilaterally. Sensation to light touch is fully intact.  Skin: Skin is warm and dry. Capillary refill takes less than 2 seconds. Rash noted. No erythema.  Patient has a erythematous macular nonblanching rash on the  left thoracic back that wraps around to the chest. Does not cross midline.  Psychiatric: He has a normal mood and affect. His behavior is normal.  Nursing note and vitals reviewed.    ED Treatments / Results  Labs (all labs ordered are listed, but only abnormal results are displayed) Labs Reviewed  CBC WITH DIFFERENTIAL/PLATELET - Abnormal; Notable for the following:       Result Value   WBC 12.8 (*)    Neutro Abs 9.1 (*)    Monocytes Absolute 1.4 (*)    All other components within normal limits  COMPREHENSIVE METABOLIC PANEL - Abnormal; Notable for the following:    Glucose, Bld 106 (*)    ALT 15 (*)    All other components within normal limits  URINALYSIS, ROUTINE W REFLEX MICROSCOPIC (NOT AT [facility]) - Abnormal; Notable for the following:    APPearance CLOUDY (*)    All other components within normal limits    EKG  EKG Interpretation None       Radiology No results found.  Procedures Procedures (including critical care time)  Medications Ordered in ED Medications - No data to display   Initial Impression / Assessment and Plan / ED Course  I have reviewed the triage vital signs and the nursing  notes.  Pertinent labs & imaging results that were available during my care of the patient were reviewed by me and considered in my medical decision making (see chart for details).  Clinical Course    Patient is a ambulating at his baseline. He appears to have a normal neurologic exam. Vital signs are stable. Patient does have mild elevation in white blood cell count of uncertain significance. We'll discharge home to follow-up closely with primary physician. Return precautions given.   Final Clinical Impressions(s) / ED Diagnoses   Final diagnoses:  Fall on same level from slipping, initial encounter  Other elevated white blood cell (WBC) count    New Prescriptions New Prescriptions   No medications on file     Julianne Rice, MD 08/02/16 2145

## 2016-08-02 NOTE — Discharge Instructions (Signed)
Make an appointment to follow-up with your primary physician. Return immediately for fever, increased weakness, increased pain, confusion or for any concerns.

## 2016-11-06 ENCOUNTER — Ambulatory Visit (INDEPENDENT_AMBULATORY_CARE_PROVIDER_SITE_OTHER): Payer: Medicare Other | Admitting: Neurology

## 2016-11-06 ENCOUNTER — Encounter: Payer: Self-pay | Admitting: Neurology

## 2016-11-06 VITALS — BP 140/86 | HR 62 | Resp 20 | Ht 69.0 in | Wt 170.5 lb

## 2016-11-06 DIAGNOSIS — B0229 Other postherpetic nervous system involvement: Secondary | ICD-10-CM

## 2016-11-06 DIAGNOSIS — R269 Unspecified abnormalities of gait and mobility: Secondary | ICD-10-CM | POA: Diagnosis not present

## 2016-11-06 DIAGNOSIS — H3530 Unspecified macular degeneration: Secondary | ICD-10-CM

## 2016-11-06 DIAGNOSIS — M6289 Other specified disorders of muscle: Secondary | ICD-10-CM | POA: Diagnosis not present

## 2016-11-06 NOTE — Progress Notes (Signed)
[facility]  PATIENT: [name] DOB: 10-26-1930  REFERRING DOCTOR OR PCP:  Ryan Kraft, MD SOURCE: patient, notes from Dr. Wilson [name], lab results  _________________________________   HISTORICAL  CHIEF COMPLAINT:  Chief Complaint  Patient presents with  . Extremity Weakness    [name] is here with his wife [name] for eval of weakness, shaking bilat legs.  Doesn't feel one leg is worse than the other.  Some improvement with home physical therapy ([facility]).  Sts. onset of sx. about a yr. ago, coinciding with shingles dx. in Jan 2017.  Sts. [name] was taking Hydroxyzine for residual itching/burning left side of back and abd. Once  [name] stopped Hydroxyzine, sts.  shaking in legs stopped, but [name] still has the weakness.  Began using a rolling walker 2 mos. ago./fim    HISTORY OF PRESENT ILLNESS:  I had the pleasure of seeing your patient, [name] for a neurologic consultation regarding his progressive gait dysfunction and poor balance.     [name] is an 83 yo man who has had progressive gait dysfunction and worsening balance x 1 year.   [name] has been using a walker x 2-3 months.    [name] denies much weakness but his legs give out if [name] stands a while. Balance is poor. [name] has only fallen once.   [name] can rise from a chair but usually uses his his arms to help. [name] has not noted reduced stride but notes [name] turns in more steps than [name] used to. [name] denies proximal arm weakness. There are no myalgias.  Bladder function is fine and [name] denies nocturia.  Urine color has not changed.  [name] denies lower back pain, leg pain or neck pain.     [name] has macular degeneration.  Glasses do not help his vision.  [name] had injections for macular degeneration but visoin has slowly worsened.     [name] had shingles last year in the left flank and has PHN with an itching burning pain.  [name] feels [name] needs to scratch.   Hydroxyzone has not helped itching.   [name] was started on amitriptyline 10 mg nightly but  felt bad on it so [name] stopped.  Opiates were initially prescribed but [name] did not tolerate them and stopped.   [name] occasionally takes tramadol at night.    [name] has Afib and bradycardia and a pacemaker has been placed.      I personally reviewed the head CT scan from 09/27/2008. It shows moderate chronic microvascular ischemic change bilaterally.  REVIEW OF SYSTEMS: Constitutional: No fevers, chills, sweats, or change in appetite.   Some fatigue Eyes: No visual changes, double vision, eye pain Ear, nose and throat: No hearing loss, ear pain, nasal congestion, sore throat Cardiovascular: has pacemaker for Afib/bradycardia.  No chest pain, palpitations Respiratory: No shortness of breath at rest or with exertion.   No wheezes GastrointestinaI: No nausea, vomiting, diarrhea, abdominal pain, fecal incontinence Genitourinary: No dysuria, urinary retention or frequency.  No nocturia. Musculoskeletal: No neck pain, back pain Integumentary: No rash, pruritus, skin lesions Neurological: as above Psychiatric: No depression at this time.  No anxiety Endocrine: No palpitations, diaphoresis, change in appetite, change in weigh or increased thirst Hematologic/Lymphatic: No anemia, purpura, petechiae. Allergic/Immunologic: No itchy/runny eyes, nasal congestion, recent allergic reactions, rashes  ALLERGIES: No Known Allergies  HOME MEDICATIONS:  Current Outpatient Prescriptions:  .  apixaban (ELIQUIS) 5 MG TABS tablet, Take 1 tablet (5 mg total) by mouth 2 (two) times  daily., Disp: 180 tablet, Rfl: 3 .  Cyanocobalamin (VITAMIN B-12 CR PO), Take 1 tablet by mouth once a week. , Disp: , Rfl:  .  magnesium 30 MG tablet, Take 30 mg by mouth every evening., Disp: , Rfl:  .  omeprazole (PRILOSEC) 20 MG capsule, Take 20 mg by mouth daily. , Disp: , Rfl:  .  triamcinolone cream (KENALOG) 0.1 %, Apply 1 application topically daily as needed (for irritation). , Disp: , Rfl:  .  traMADol (ULTRAM) 50 MG tablet,  Take 50 mg by mouth every 6 (six) hours as needed for moderate pain., Disp: , Rfl:   PAST MEDICAL HISTORY: Past Medical History:  Diagnosis Date  . CAD (coronary artery disease) 09/2008  . Cancer ([place])    basal cell carcinoma  . CHF (congestive heart failure) ([place])   . Complication of anesthesia    06/12/11 anesthesia made legs asleep also and went into chf and ended up in ICU after gallbladder surgery  . Diastolic dysfunction    hx of chf due to fluid overload 9/12   . Dysrhythmia    atrial fibrillation  . GERD (gastroesophageal reflux disease)   . History of blood product transfusion    d/t bleeding ulcer  . Hypertension   . Macular degeneration (senile) of retina   . Persistent atrial fibrillation ([place])   . PONV (postoperative nausea and vomiting)   . Presence of permanent cardiac pacemaker 2010  . Shortness of breath dyspnea    exertion  . Sick sinus syndrome (H[facility])    St. Jude  . Syncope    resolved s/p PPM    PAST SURGICAL HISTORY: Past Surgical History:  Procedure Laterality Date  . CHOLECYSTECTOMY  06/12/11  . INGUINAL HERNIA REPAIR Left 10/06/2014   Procedure: OPEN REPAIR LEFT INGUINAL HERNIA ;  Surgeon: Fanny Skates, MD;  Location: [facility];  Service: General;  Laterality: Left;  . INSERTION OF MESH Left 10/06/2014   Procedure: INSERTION OF MESH;  Surgeon: Fanny Skates, MD;  Location: [facility];  Service: General;  Laterality: Left;  . LAPAROSCOPIC CHOLECYSTECTOMY W/ CHOLANGIOGRAPHY  06/12/2011  . PACEMAKER INSERTION  09/28/08   SJM Zephyr XL DR  . perforated ulcer    . right inguinal hernia repair  11/19/2002   with mesh  . TRANSURETHRAL RESECTION OF BLADDER TUMOR  09/06/2011   Procedure: TRANSURETHRAL RESECTION OF BLADDER TUMOR (TURBT);  Surgeon: Ailene Rud, MD;  Location[facility];  Service: Urology;  Laterality: N/A;  . TRANSURETHRAL RESECTION OF PROSTATE  95/18/8416  . umblical hernia  02/21/3015    FAMILY HISTORY: Family History  Problem Relation Age  of Onset  . Heart attack Mother 32    MI  . Heart failure Father   . Heart attack Brother     Late 15s  . Cancer Sister     SOCIAL HISTORY:  Social History   Social History  . Marital status: Married    Spouse name: N/A  . Number of children: N/A  . Years of education: N/A   Occupational History  . Not on file.   Social History Main Topics  . Smoking status: Former Smoker    Quit date: 09/17/1958  . Smokeless tobacco: Never Used     Comment: quit 1968  . Alcohol use No  . Drug use: No  . Sexual activity: Not on file   Other Topics Concern  . Not on file   Social History Narrative  . No narrative on file  PHYSICAL EXAM  Vitals:   11/06/16 1024  BP: 140/86  Pulse: 62  Resp: 20  Weight: 170 lb 8 oz (77.3 kg)  Height: _0  (1.753 m)    Body mass index is 25.18 kg/m.   General: The patient is well-developed and well-nourished and in no acute distress  Eyes:  Funduscopic exam difficult with small pupils.  .  Neck: The neck is supple, no carotid bruits are noted.  The neck is nontender.  Cardiovascular: The heart has a regular rate and rhythm with a normal S1 and S2. There were no murmurs, gallops or rubs. Lungs are clear to auscultation.  Skin: Extremities are without significant edema.  Musculoskeletal:  Back is nontender  Neurologic Exam  Mental status: The patient is alert and oriented x 3 at the time of the examination. The patient has apparent normal recent and remote memory, with an apparently normal attention span and concentration ability.   Speech is normal.  Cranial nerves: Extraocular movements are full. Pupils are equal, round, and reactive to light and accomodation.  Visual fields are full.  Facial symmetry is present. There is good facial sensation to soft touch bilaterally.Facial strength is normal.  Trapezius and sternocleidomastoid strength is normal. No dysarthria is noted.  The tongue is midline, and the patient has symmetric  elevation of the soft palate. Mild hearing deficits are noted.  Motor:  Muscle bulk is normal.   Tone is normal. Strength is  5 / 5 in all 4 extremities.   [name] can rise from a chair 3 times without using his arms  Sensory: Sensory testing is intact to pinprick, soft touch and vibration sensation in the arms.  Touch sensation is normal in the legs. Vibration sensation is 50% reduced at the toes but fairly normal at the ankles..  Coordination: Cerebellar testing reveals good finger-nose-finger and heel-to-shin bilaterally.  Gait and station: Station is normal.   Gait has a small stride without his walker but stride appears to be normal length when [name] uses his walker. [name] cannot tandem walk. Romberg is negative.   Reflexes: Deep tendon reflexes are symmetric and normal bilaterally.   Plantar responses are flexor.    DIAGNOSTIC DATA (LABS, IMAGING, TESTING) - I reviewed patient records, labs, notes, testing and imaging myself where available.  Lab Results  Component Value Date   WBC 12.8 (H) 08/02/2016   HGB 14.6 08/02/2016   HCT 43.9 08/02/2016   MCV 94.4 08/02/2016   PLT 216 08/02/2016      Component Value Date/Time   NA 137 08/02/2016 2012   K 3.7 08/02/2016 2012   CL 105 08/02/2016 2012   CO2 26 08/02/2016 2012   GLUCOSE 106 (H) 08/02/2016 2012   BUN 16 08/02/2016 2012   CREATININE 0.95 08/02/2016 2012   CALCIUM 8.9 08/02/2016 2012   PROT 7.2 08/02/2016 2012   ALBUMIN 3.9 08/02/2016 2012   AST 22 08/02/2016 2012   ALT 15 (L) 08/02/2016 2012   ALKPHOS 78 08/02/2016 2012   BILITOT 1.2 08/02/2016 2012   GFRNONAA >60 08/02/2016 2012   GFRAA >60 08/02/2016 2012   Lab Results  Component Value Date   CHOL  09/27/2008    183        ATP III CLASSIFICATION:  <200     mg/dL   Desirable  200-239  mg/dL   Borderline High  >=240    mg/dL   High          HDL 65 09/27/2008  LDLCALC (H) 09/27/2008    103        Total Cholesterol/HDL:CHD Risk Coronary Heart Disease Risk Table                      Men   Women  1/2 Average Risk   3.4   3.3  Average Risk       5.0   4.4  2 X Average Risk   9.6   7.1  3 X Average Risk  23.4   11.0        Use the calculated Patient Ratio above and the CHD Risk Table to determine the patient's CHD Risk.        ATP III CLASSIFICATION (LDL):  <100     mg/dL   Optimal  100-129  mg/dL   Near or Above                    Optimal  130-159  mg/dL   Borderline  160-189  mg/dL   High  >190     mg/dL   Very High   TRIG 74 09/27/2008   CHOLHDL 2.8 09/27/2008   No results found for: HGBA1C Lab Results  Component Value Date   VITAMINB12 292 10/07/2008        ASSESSMENT AND PLAN  Gait disturbance - Plan: CT HEAD WO CONTRAST, CK, Acetylcholine receptor, binding, Sedimentation rate, Vitamin B12  Muscle fatigue  Post herpetic neuralgia  Macular degeneration   In summary, [name] is an 83 year old man who has had progressive gait dysfunction over the last year. [name] now uses a walker and gets by fairly well with that. His emanation, [name] actually had fairly decent strength and was able to rise from a chair without using his arms 3 times. This is normal for his age. The size of his step was reduced when [name] walked without a walker but [name] had a good stride with his walker. I believe his gait dysfunction is probably multifactorial. Reduced visual acuity and arthritis are playing some role. Additionally, a CT scan many years ago showed some chronic microvascular ischemic change. Worsening chronic ischemic changes are likely also playing a role. We need to check a CT scan of the head to make sure that there is not hydrocephalus chronic subdural hematomas or other etiology that would require treatment. Additionally I will check blood work for creatine kinase, ESR and acetylcholine receptor as [name] does fatigue easily. I'll also check a vitamin B12. I will hold off on an EMG nerve conduction study as [name] had only minimal vibratory sensation loss in the  toes that would be unlikely to contribute to gait dysfunction.  [name] also has postherpetic neuralgia. Because [name] is a little off balance, I will refrain from adding gabapentin at this time. Consider Lidoderm patches or lidocaine ointment if symptoms do not improve.   [name] is advised to continue to use the walker.  [name] will return to see me in 3-4 months or sooner if [name] has new or worsening neurologic symptoms.  Thank you for asking me to see [name] for neurologic consultation. Please let me know if I can be of further assistance with him or other patients in the future.   Lux Skilton A. Felecia Shelling, MD, PhD 7/47/3403, 7:09 PM Certified in Neurology, Clinical Neurophysiology, Sleep Medicine, Pain Medicine and Neuroimaging  [facility] [address] [place], [place] [address] [phone]

## 2016-11-07 LAB — ACETYLCHOLINE RECEPTOR, BINDING: AChR Binding Ab, Serum: <0.03 nmol/L (ref 0.00–0.24)

## 2016-11-07 LAB — CK: CK TOTAL: 41 U/L (ref 24–204)

## 2016-11-07 LAB — SEDIMENTATION RATE: Sed Rate: 2 mm/hr (ref 0–30)

## 2016-11-07 LAB — VITAMIN B12: Vitamin B-12: 859 pg/mL (ref 232–1245)

## 2016-11-08 ENCOUNTER — Telehealth: Payer: Self-pay | Admitting: *Deleted

## 2016-11-08 NOTE — Telephone Encounter (Signed)
-----   Message from Britt Bottom, MD sent at 11/08/2016  4:47 PM EST ----- Please let him know that the lab work was normal.

## 2016-11-08 NOTE — Telephone Encounter (Signed)
I have spoken with wife [name] this afternoon and per RAS, advised that labwork done in our office is normal.  She verbalized understanding of same/fim

## 2016-11-21 ENCOUNTER — Ambulatory Visit
Admission: RE | Admit: 2016-11-21 | Discharge: 2016-11-21 | Disposition: A | Discharge status: Home / Self Care | Payer: Medicare Other | Source: Ambulatory Visit | Attending: Neurology | Admitting: Neurology

## 2016-11-21 DIAGNOSIS — R269 Unspecified abnormalities of gait and mobility: Secondary | ICD-10-CM | POA: Diagnosis not present

## 2016-11-28 ENCOUNTER — Telehealth: Payer: Self-pay | Admitting: Neurology

## 2016-11-28 DIAGNOSIS — I671 Cerebral aneurysm, nonruptured: Secondary | ICD-10-CM

## 2016-11-28 NOTE — Telephone Encounter (Signed)
I discussed the CT scan findings with Mrs. Ronnald Ramp.   He has significant age-related changes including atrophy and chronic microvessel ischemic change, both progressed when compared to the 2013 CT scan (and likely playing some role in his poor gait). He also appears to have a possible aneurysm that appeared to also been there in 2013. To better characterize, we will check a CT angiogram.  (has pacemaker so will do CTA instead of MRA)  They have a follow-up visit with me in 2 months  crt = 0.95

## 2016-12-07 ENCOUNTER — Ambulatory Visit
Admission: RE | Admit: 2016-12-07 | Discharge: 2016-12-07 | Disposition: A | Discharge status: Home / Self Care | Payer: Medicare Other | Source: Ambulatory Visit | Attending: Neurology | Admitting: Neurology

## 2016-12-07 DIAGNOSIS — I671 Cerebral aneurysm, nonruptured: Secondary | ICD-10-CM

## 2016-12-11 ENCOUNTER — Telehealth: Payer: Self-pay | Admitting: *Deleted

## 2016-12-11 NOTE — Telephone Encounter (Signed)
-----   Message from Britt Bottom, MD sent at 12/10/2016  7:19 PM EDT ----- Please let him now that the CT angiogram did not show any aneurysms.

## 2016-12-11 NOTE — Telephone Encounter (Signed)
I have spoken with [name] this afternoon, and per RAS, explained that CT angiogram did not show any aneurysms.  He verbalized understanding of same/fim

## 2016-12-14 ENCOUNTER — Ambulatory Visit (INDEPENDENT_AMBULATORY_CARE_PROVIDER_SITE_OTHER): Payer: Medicare Other | Admitting: Podiatry

## 2016-12-14 ENCOUNTER — Encounter: Payer: Self-pay | Admitting: Internal Medicine

## 2016-12-14 DIAGNOSIS — M79676 Pain in unspecified toe(s): Secondary | ICD-10-CM

## 2016-12-14 DIAGNOSIS — B351 Tinea unguium: Secondary | ICD-10-CM | POA: Diagnosis not present

## 2016-12-14 DIAGNOSIS — R001 Bradycardia, unspecified: Secondary | ICD-10-CM | POA: Diagnosis not present

## 2016-12-14 NOTE — Progress Notes (Signed)
Complaint:  Visit Type: Patient returns to my office for continued preventative foot care services. Complaint: Patient states" my nails have grown long and thick and become painful to walk and wear shoes" . The patient presents for preventative foot care services. No changes to ROS  Podiatric Exam: Vascular: dorsalis pedis and posterior tibial pulses are palpable bilateral. Capillary return is immediate. Temperature gradient is WNL. Skin turgor WNL  Sensorium: Normal Semmes Weinstein monofilament test. Normal tactile sensation bilaterally. Nail Exam: Pt has thick disfigured discolored nails with subungual debris noted bilateral entire nail hallux through fifth toenails Ulcer Exam: There is no evidence of ulcer or pre-ulcerative changes or infection. Orthopedic Exam: Muscle tone and strength are WNL. No limitations in general ROM. No crepitus or effusions noted. Foot type and digits show no abnormalities. Bony prominences are unremarkable. Skin: No Porokeratosis. No infection or ulcers  Diagnosis:  Onychomycosis, , Pain in right toe, pain in left toes  Treatment & Plan Procedures and Treatment: Consent by patient was obtained for treatment procedures. The patient understood the discussion of treatment and procedures well. All questions were answered thoroughly reviewed. Debridement of mycotic and hypertrophic toenails, 1 through 5 bilateral and clearing of subungual debris. No ulceration, no infection noted.  Return Visit-Office Procedure: Patient instructed to return to the office for a follow up visit 3 months   for continued evaluation and treatment.    Amery Minasyan DPM 

## 2016-12-17 ENCOUNTER — Emergency Department (HOSPITAL_COMMUNITY)
Admission: EM | Admit: 2016-12-17 | Discharge: 2016-12-17 | Disposition: A | Discharge status: Home / Self Care | Payer: Medicare Other | Attending: Emergency Medicine | Admitting: Emergency Medicine

## 2016-12-17 ENCOUNTER — Emergency Department (HOSPITAL_COMMUNITY): Payer: Medicare Other

## 2016-12-17 DIAGNOSIS — I110 Hypertensive heart disease with heart failure: Secondary | ICD-10-CM | POA: Diagnosis not present

## 2016-12-17 DIAGNOSIS — B0229 Other postherpetic nervous system involvement: Secondary | ICD-10-CM

## 2016-12-17 DIAGNOSIS — Z79899 Other long term (current) drug therapy: Secondary | ICD-10-CM | POA: Insufficient documentation

## 2016-12-17 DIAGNOSIS — Z7901 Long term (current) use of anticoagulants: Secondary | ICD-10-CM | POA: Diagnosis not present

## 2016-12-17 DIAGNOSIS — I2510 Atherosclerotic heart disease of native coronary artery without angina pectoris: Secondary | ICD-10-CM | POA: Insufficient documentation

## 2016-12-17 DIAGNOSIS — Z950 Presence of cardiac pacemaker: Secondary | ICD-10-CM | POA: Diagnosis not present

## 2016-12-17 DIAGNOSIS — Z85828 Personal history of other malignant neoplasm of skin: Secondary | ICD-10-CM | POA: Diagnosis not present

## 2016-12-17 DIAGNOSIS — R21 Rash and other nonspecific skin eruption: Secondary | ICD-10-CM | POA: Diagnosis present

## 2016-12-17 DIAGNOSIS — I509 Heart failure, unspecified: Secondary | ICD-10-CM | POA: Diagnosis not present

## 2016-12-17 DIAGNOSIS — M549 Dorsalgia, unspecified: Secondary | ICD-10-CM | POA: Diagnosis not present

## 2016-12-17 MED ORDER — LIDOCAINE 5 % EX PTCH: 1.0000 | MEDICATED_PATCH | CUTANEOUS | 0 refills | Status: DC

## 2016-12-17 NOTE — ED Provider Notes (Signed)
[facility] DEPT Provider Note   CSN: [id number] Arrival date & time: 12/17/16  1102     History   Chief Complaint Chief Complaint  Patient presents with  . Rash  . Back Pain    HPI [name] is a 83 y.o. male.  HPI Patient presents to the emergency department with pain over the left side of his mid back in the area where the patient had a shingles outbreak last year.  The patient has had postherpetic neuralgia over that timeframe.  He states he felt like the pain was worse today and that is what brought him to the hospital.  The patient states that he takes tramadol and uses triamcinolone cream.  He states that last night the triamcinolone did not seem to work.  Patient states that he has pain in the lateral flank and rib area anteriorly in the region where he has chronic pain from his shingles.  She is unable to take any gabapentin type medications for his symptoms.  Patient denies chest pain, shortness breath, nausea, vomiting, weakness, dizziness, headache, blurred vision, fever, cough, abdominal pain or syncope Past Medical History:  Diagnosis Date  . CAD (coronary artery disease) 09/2008  . Cancer ([place])    basal cell carcinoma  . CHF (congestive heart failure) ([place])   . Complication of anesthesia    06/12/11 anesthesia made legs asleep also and went into chf and ended up in ICU after gallbladder surgery  . Diastolic dysfunction    hx of chf due to fluid overload 9/12   . Dysrhythmia    atrial fibrillation  . GERD (gastroesophageal reflux disease)   . History of blood product transfusion    d/t bleeding ulcer  . Hypertension   . Macular degeneration (senile) of retina   . Persistent atrial fibrillation ([place])   . PONV (postoperative nausea and vomiting)   . Presence of permanent cardiac pacemaker 2010  . Shortness of breath dyspnea    exertion  . Sick sinus syndrome (HCC)    St. Jude  . Syncope    resolved s/p PPM    Patient Active Problem List   Diagnosis  Date Noted  . Intracranial aneurysm 11/28/2016  . Gait disturbance 11/06/2016  . Muscle fatigue 11/06/2016  . Post herpetic neuralgia 11/06/2016  . Left inguinal hernia 10/06/2014  . Shortness of breath 01/03/2014  . Pacemaker-St.Jude 07/09/2012  . Permanent atrial fibrillation ([place]) 10/20/2010  . HYPERLIPIDEMIA-MIXED 10/26/2009  . Macular degeneration 12/28/2008  . CAD, NATIVE VESSEL 12/28/2008  . GERD 12/28/2008  . SYNCOPE 12/28/2008  . Hypertension 12/28/2008  . Sick sinus syndrome ([place]) 12/28/2008    Past Surgical History:  Procedure Laterality Date  . CHOLECYSTECTOMY  06/12/11  . INGUINAL HERNIA REPAIR Left 10/06/2014   Procedure: OPEN REPAIR LEFT INGUINAL HERNIA ;  Surgeon: Fanny Skates, MD;  Location: [facility];  Service: General;  Laterality: Left;  . INSERTION OF MESH Left 10/06/2014   Procedure: INSERTION OF MESH;  Surgeon: Fanny Skates, MD;  Location: [facility];  Service: General;  Laterality: Left;  . LAPAROSCOPIC CHOLECYSTECTOMY W/ CHOLANGIOGRAPHY  06/12/2011  . PACEMAKER INSERTION  09/28/08   SJM Zephyr XL DR  . perforated ulcer    . right inguinal hernia repair  11/19/2002   with mesh  . TRANSURETHRAL RESECTION OF BLADDER TUMOR  09/06/2011   Procedure: TRANSURETHRAL RESECTION OF BLADDER TUMOR (TURBT);  Surgeon: Ailene Rud, MD;  Location: [facility];  Service: Urology;  Laterality: N/A;  . TRANSURETHRAL RESECTION  OF PROSTATE  10/62/6948  . umblical hernia  5/46/2703       Home Medications    Prior to Admission medications   Medication Sig Start Date End Date Taking? Authorizing Provider  apixaban (ELIQUIS) 5 MG TABS tablet Take 1 tablet (5 mg total) by mouth 2 (two) times daily. 04/19/16  Yes Thompson Grayer, MD  Cyanocobalamin (VITAMIN B-12 CR PO) Take 1 tablet by mouth once a week.    Yes Historical Provider, MD  magnesium 30 MG tablet Take 30 mg by mouth every evening.   Yes Historical Provider, MD  omeprazole (PRILOSEC) 20 MG capsule Take 20 mg by mouth daily.     Yes Historical Provider, MD  traMADol (ULTRAM) 50 MG tablet Take 50 mg by mouth every 6 (six) hours as needed for moderate pain.   Yes Historical Provider, MD  triamcinolone cream (KENALOG) 0.1 % Apply 1 application topically daily as needed (for irritation).  05/23/16  Yes Historical Provider, MD    Family History Family History  Problem Relation Age of Onset  . Heart attack Mother 36    MI  . Heart failure Father   . Heart attack Brother     Late 74s  . Cancer Sister     Social History Social History  Substance Use Topics  . Smoking status: Former Smoker    Quit date: 09/17/1958  . Smokeless tobacco: Never Used     Comment: quit 1968  . Alcohol use No     Allergies   Other   Review of Systems Review of Systems All other systems negative except as documented in the HPI. All pertinent positives and negatives as reviewed in the HPI.   Physical Exam Updated Vital Signs BP 136/87   Pulse 65   Temp 98.4 F (36.9 C) (Oral)   Resp 16   SpO2 95%   Physical Exam  Constitutional: He is oriented to person, place, and time. He appears well-developed and well-nourished. No distress.  HENT:  Head: Normocephalic and atraumatic.  Mouth/Throat: Oropharynx is clear and moist.  Eyes: Pupils are equal, round, and reactive to light.  Neck: Normal range of motion. Neck supple.  Cardiovascular: Normal rate, regular rhythm and normal heart sounds.  Exam reveals no gallop and no friction rub.   No murmur heard. Pulmonary/Chest: Effort normal and breath sounds normal. No respiratory distress. He has no wheezes.  Musculoskeletal:       Arms: Neurological: He is alert and oriented to person, place, and time. He exhibits normal muscle tone. Coordination normal.  Skin: Skin is warm and dry. Capillary refill takes less than 2 seconds. No rash noted. No erythema.  Psychiatric: He has a normal mood and affect. His behavior is normal.  Nursing note and vitals reviewed.    ED Treatments  / Results  Labs (all labs ordered are listed, but only abnormal results are displayed) Labs Reviewed - No data to display  EKG  EKG Interpretation None       Radiology No results found.  Procedures Procedures (including critical care time)  Medications Ordered in ED Medications - No data to display   Initial Impression / Assessment and Plan / ED Course  I have reviewed the triage vital signs and the nursing notes.  Pertinent labs & imaging results that were available during my care of the patient were reviewed by me and considered in my medical decision making (see chart for details).    Patient will be treated with Lidoderm patches  and advised follow-up with primary care doctor.  Patient agrees the plan and all questions were answered   Final Clinical Impressions(s) / ED Diagnoses   Final diagnoses:  None    New Prescriptions New Prescriptions   No medications on file     Dalia Heading, PA-C 12/19/16 0128    Fredia Sorrow, MD 12/19/16 2002

## 2016-12-17 NOTE — ED Notes (Signed)
Patient transported to X-ray 

## 2016-12-17 NOTE — ED Triage Notes (Addendum)
To ED for increased pain to skin on left torso and left upper back. Pt has hx of shingles in this area and was told this pain was 'an after effect' of shingles. Pt uses a cream but since yesterday the cream hasn't worked well. If pressure is placed to area 'the pain eases off'. No cp. No vomiting. No fever. States pain is a 'steady pain and feels like a nerve pain. Pt does have a rash at the area of pain but no open wounds

## 2016-12-17 NOTE — ED Notes (Signed)
EDP at bedside  

## 2016-12-17 NOTE — Discharge Instructions (Signed)
Return here as needed.  Follow-up with your primary care doctor °

## 2017-02-08 ENCOUNTER — Encounter: Payer: Self-pay | Admitting: Neurology

## 2017-02-08 ENCOUNTER — Ambulatory Visit (INDEPENDENT_AMBULATORY_CARE_PROVIDER_SITE_OTHER): Payer: Medicare Other | Admitting: Neurology

## 2017-02-08 VITALS — BP 134/87 | HR 61 | Resp 18 | Ht 69.0 in | Wt 170.0 lb

## 2017-02-08 DIAGNOSIS — R269 Unspecified abnormalities of gait and mobility: Secondary | ICD-10-CM

## 2017-02-08 DIAGNOSIS — M6289 Other specified disorders of muscle: Secondary | ICD-10-CM

## 2017-02-08 DIAGNOSIS — B0229 Other postherpetic nervous system involvement: Secondary | ICD-10-CM

## 2017-02-08 MED ORDER — TRAMADOL HCL (ER BIPHASIC) 100 MG PO CP24: 100.0000 mg | ORAL_CAPSULE | Freq: Every day | ORAL | 5 refills | Status: DC

## 2017-02-08 NOTE — Progress Notes (Signed)
[facility]  PATIENT: [name] DOB: 07-15-1931  REFERRING DOCTOR OR PCP:  Anda Kraft, MD SOURCE: patient, notes from Dr. Wilson Singer, lab results  _________________________________   HISTORICAL  CHIEF COMPLAINT:  Chief Complaint  Patient presents with  . Gait Disturbance    Sts gait is about the same.  Denies new or worsening sx/fim    HISTORY OF PRESENT ILLNESS:  [name] is an 83 yo man who has had progressive gait dysfunction and worsening balance since early 2017.  He also has postherpetic neuralgia that has been bothering him more.   He feels his gait is unchanged since his last visit. He has been using a walker. He does not feel any weaker and he notes he can sometimes get out of the chair without using his arms though he usually will use the arms.      At the first visit imaging was ordered and there was concern about an aneurysm.   However, the CTA did not show one.     He denies much weakness but his legs give out if he stands a while. Balance is poor.     With a walker, he has a normal-size stride and turns easily. Without a walker, his stride is reduced.  He denies proximal arm weakness. There are no myalgias.  Bladder function is fine and he denies nocturia.  Urine color has not changed.  He denies lower back pain, leg pain or neck pain.     He had shingles last year in the left flank and has PHN with an itching burning pain on his back.   Pain was very intense and he was placed on Lidoderm by ED recently but it caused a headache.   Hydroxyzone has not helped itching.   He was started on amitriptyline 10 mg nightly but felt bad on it so he stopped.  Opiates were initially prescribed but he did not tolerate them and stopped.   He occasionally takes tramadol at night and it is helping some.     He has macular degeneration.  Glasses do not help his vision.  He had injections for macular degeneration but visoin has slowly worsened.     He has Afib  and bradycardia and a pacemaker has been placed.      Head CT from 11/21/2016 and CT angiogram 12/07/2016 were reviewed. I concur with the official interpretations.  REVIEW OF SYSTEMS: Constitutional: No fevers, chills, sweats, or change in appetite.   Some fatigue Eyes: No visual changes, double vision, eye pain Ear, nose and throat: No hearing loss, ear pain, nasal congestion, sore throat Cardiovascular: has pacemaker for Afib/bradycardia.  No chest pain, palpitations Respiratory: No shortness of breath at rest or with exertion.   No wheezes GastrointestinaI: No nausea, vomiting, diarrhea, abdominal pain, fecal incontinence Genitourinary: No dysuria, urinary retention or frequency.  No nocturia. Musculoskeletal: No neck pain, back pain Integumentary: No rash, pruritus, skin lesions Neurological: as above Psychiatric: No depression at this time.  No anxiety Endocrine: No palpitations, diaphoresis, change in appetite, change in weigh or increased thirst Hematologic/Lymphatic: No anemia, purpura, petechiae. Allergic/Immunologic: No itchy/runny eyes, nasal congestion, recent allergic reactions, rashes  ALLERGIES: Allergies  Allergen Reactions  . Other Anxiety    HYDROXIZINE     HOME MEDICATIONS:  Current Outpatient Prescriptions:  .  apixaban (ELIQUIS) 5 MG TABS tablet, Take 1 tablet (5 mg total) by mouth 2 (two) times daily., Disp: 180 tablet, Rfl: 3 .  cholecalciferol (VITAMIN  D) 1000 units tablet, Take 5,000 Units by mouth daily., Disp: , Rfl:  .  Cyanocobalamin (VITAMIN B-12 CR PO), Take 1 tablet by mouth once a week. , Disp: , Rfl:  .  famotidine (PEPCID) 20 MG tablet, Take 20 mg by mouth 2 (two) times daily., Disp: , Rfl:  .  magnesium 30 MG tablet, Take 30 mg by mouth every evening., Disp: , Rfl:  .  triamcinolone cream (KENALOG) 0.1 %, Apply 1 application topically daily as needed (for irritation). , Disp: , Rfl:  .  TraMADol HCl 100 MG CP24, Take 100 mg by mouth  daily., Disp: 30 capsule, Rfl: 5  PAST MEDICAL HISTORY: Past Medical History:  Diagnosis Date  . CAD (coronary artery disease) 09/2008  . Cancer ([place])    basal cell carcinoma  . CHF (congestive heart failure) ([place])   . Complication of anesthesia    06/12/11 anesthesia made legs asleep also and went into chf and ended up in ICU after gallbladder surgery  . Diastolic dysfunction    hx of chf due to fluid overload 9/12   . Dysrhythmia    atrial fibrillation  . GERD (gastroesophageal reflux disease)   . History of blood product transfusion    d/t bleeding ulcer  . Hypertension   . Macular degeneration (senile) of retina   . Persistent atrial fibrillation ([place])   . PONV (postoperative nausea and vomiting)   . Presence of permanent cardiac pacemaker 2010  . Shortness of breath dyspnea    exertion  . Sick sinus syndrome (HCC)    St. Jude  . Syncope    resolved s/p PPM    PAST SURGICAL HISTORY: Past Surgical History:  Procedure Laterality Date  . CHOLECYSTECTOMY  06/12/11  . INGUINAL HERNIA REPAIR Left 10/06/2014   Procedure: OPEN REPAIR LEFT INGUINAL HERNIA ;  Surgeon: Fanny Skates, MD;  Location: [facility];  Service: General;  Laterality: Left;  . INSERTION OF MESH Left 10/06/2014   Procedure: INSERTION OF MESH;  Surgeon: Fanny Skates, MD;  Location: [facility];  Service: General;  Laterality: Left;  . LAPAROSCOPIC CHOLECYSTECTOMY W/ CHOLANGIOGRAPHY  06/12/2011  . PACEMAKER INSERTION  09/28/08   SJM Zephyr XL DR  . perforated ulcer    . right inguinal hernia repair  11/19/2002   with mesh  . TRANSURETHRAL RESECTION OF BLADDER TUMOR  09/06/2011   Procedure: TRANSURETHRAL RESECTION OF BLADDER TUMOR (TURBT);  Surgeon: Ailene Rud, MD;  Location[facility];  Service: Urology;  Laterality: N/A;  . TRANSURETHRAL RESECTION OF PROSTATE  77/93/9030  . umblical hernia  0/92/3300    FAMILY HISTORY: Family History  Problem Relation Age of Onset  . Heart attack Mother 68       MI  .  Heart failure Father   . Heart attack Brother        Late 33s  . Cancer Sister     SOCIAL HISTORY:  Social History   Social History  . Marital status: Married    Spouse name: N/A  . Number of children: N/A  . Years of education: N/A   Occupational History  . Not on file.   Social History Main Topics  . Smoking status: Former Smoker    Quit date: 09/17/1958  . Smokeless tobacco: Never Used     Comment: quit 1968  . Alcohol use No  . Drug use: No  . Sexual activity: Not on file   Other Topics Concern  . Not on file  Social History Narrative  . No narrative on file     PHYSICAL EXAM  Vitals:   02/08/17 1005  BP: 134/87  Pulse: 61  Resp: 18  Weight: 170 lb (77.1 kg)  Height: 5\' 9"  (1.753 m)    Body mass index is 25.1 kg/m.   General: The patient is well-developed and well-nourished and in no acute distress  Eyes:  Funduscopic exam difficult with small pupils.  .  Neck: The neck is supple, no carotid bruits are noted.  The neck is nontender.  Cardiovascular: The heart has a regular rate and rhythm with a normal S1 and S2. There were no murmurs, gallops or rubs. Lungs are clear to auscultation.  Skin: Extremities are without significant edema.  Musculoskeletal:  Back is nontender  Neurologic Exam  Mental status: The patient is alert and oriented x 3 at the time of the examination. The patient has apparent normal recent and remote memory, with an apparently normal attention span and concentration ability.   Speech is normal.  Cranial nerves: Extraocular movements are full. Pupils are equal, round, and reactive to light and accomodation.  Visual fields are full.  Facial symmetry is present. There is good facial sensation to soft touch bilaterally.Facial strength is normal.  Trapezius and sternocleidomastoid strength is normal. No dysarthria is noted.  The tongue is midline, and the patient has symmetric elevation of the soft palate. Mild hearing deficits are  noted.  Motor:  Muscle bulk is normal.   Tone is normal. Strength is  5 / 5 in all 4 extremities.   He is able to rise from a chair without using his arms and can do so 3 times. This is unchanged.  Sensory: Sensory testing is intact to pinprick, soft touch and vibration sensation in the arms.  Touch sensation is normal in the legs. Vibration sensation is normal at the ankles and reduced by about half at the toes.  Coordination: Cerebellar testing reveals good finger-nose-finger and heel-to-shin bilaterally.  Gait and station: Station is normal.   Gait has a small stride without his walker but stride appears to be normal length when he uses his walker. He cannot tandem walk. Romberg is negative.   Reflexes: Deep tendon reflexes are symmetric and normal bilaterally.       DIAGNOSTIC DATA (LABS, IMAGING, TESTING) - I reviewed patient records, labs, notes, testing and imaging myself where available.  Lab Results  Component Value Date   WBC 12.8 (H) 08/02/2016   HGB 14.6 08/02/2016   HCT 43.9 08/02/2016   MCV 94.4 08/02/2016   PLT 216 08/02/2016      Component Value Date/Time   NA 137 08/02/2016 2012   K 3.7 08/02/2016 2012   CL 105 08/02/2016 2012   CO2 26 08/02/2016 2012   GLUCOSE 106 (H) 08/02/2016 2012   BUN 16 08/02/2016 2012   CREATININE 0.95 08/02/2016 2012   CALCIUM 8.9 08/02/2016 2012   PROT 7.2 08/02/2016 2012   ALBUMIN 3.9 08/02/2016 2012   AST 22 08/02/2016 2012   ALT 15 (L) 08/02/2016 2012   ALKPHOS 78 08/02/2016 2012   BILITOT 1.2 08/02/2016 2012   GFRNONAA >60 08/02/2016 2012   GFRAA >60 08/02/2016 2012   Lab Results  Component Value Date   CHOL  09/27/2008    183        ATP III CLASSIFICATION:  <200     mg/dL   Desirable  200-239  mg/dL   Borderline High  >=240  mg/dL   High          HDL 65 09/27/2008   LDLCALC (H) 09/27/2008    103        Total Cholesterol/HDL:CHD Risk Coronary Heart Disease Risk Table                     Men   Women  1/2  Average Risk   3.4   3.3  Average Risk       5.0   4.4  2 X Average Risk   9.6   7.1  3 X Average Risk  23.4   11.0        Use the calculated Patient Ratio above and the CHD Risk Table to determine the patient's CHD Risk.        ATP III CLASSIFICATION (LDL):  <100     mg/dL   Optimal  100-129  mg/dL   Near or Above                    Optimal  130-159  mg/dL   Borderline  160-189  mg/dL   High  >190     mg/dL   Very High   TRIG 74 09/27/2008   CHOLHDL 2.8 09/27/2008   No results found for: HGBA1C Lab Results  Component Value Date   VITAMINB12 859 11/06/2016        ASSESSMENT AND PLAN  Gait disturbance  Post herpetic neuralgia  Muscle fatigue   1.    His gait is multifactorial. He is stable and he does very well with a walker. He is advised to continue to use the walker even inside the home. 2.    He uses the tramadol sparingly but notes some benefit. He does not want to take the medication more than once a day. I will put him on tramadol ER 100 mg and would be happy to increase this dose to 150 mg tolerates it well and gets a benefit. 3.    At this point, he appears stable and we will make his follow-up as needed.  He is advised to call if there are significantly new or worsening neurologic symptoms.   Anmarie Fukushima A. Felecia Shelling, MD, PhD 1/61/0960, 4:54 PM Certified in Neurology, Clinical Neurophysiology, Sleep Medicine, Pain Medicine and Neuroimaging  [facility] [address] [place], [place] [address] [phone]

## 2017-02-18 ENCOUNTER — Ambulatory Visit (INDEPENDENT_AMBULATORY_CARE_PROVIDER_SITE_OTHER): Payer: Medicare Other | Admitting: Internal Medicine

## 2017-02-18 ENCOUNTER — Encounter: Payer: Medicare Other | Admitting: Internal Medicine

## 2017-02-18 ENCOUNTER — Encounter: Payer: Self-pay | Admitting: Internal Medicine

## 2017-02-18 VITALS — BP 139/88 | HR 63 | Ht 70.0 in | Wt 174.4 lb

## 2017-02-18 DIAGNOSIS — R001 Bradycardia, unspecified: Secondary | ICD-10-CM | POA: Diagnosis not present

## 2017-02-18 DIAGNOSIS — I4821 Permanent atrial fibrillation: Secondary | ICD-10-CM

## 2017-02-18 DIAGNOSIS — I10 Essential (primary) hypertension: Secondary | ICD-10-CM | POA: Diagnosis not present

## 2017-02-18 DIAGNOSIS — I482 Chronic atrial fibrillation: Secondary | ICD-10-CM

## 2017-02-18 LAB — CUP PACEART INCLINIC DEVICE CHECK
Battery Impedance: 4400 Ohm
Battery Voltage: 2.74 V
Date Time Interrogation Session: 20180604150420
Implantable Lead Implant Date: 20100112
Implantable Lead Location: 753859
Implantable Lead Location: 753860
Lead Channel Impedance Value: 343 Ohm
Lead Channel Setting Pacing Amplitude: 2.5 V
Lead Channel Setting Sensing Sensitivity: 1.5 mV
MDC IDC LEAD IMPLANT DT: 20100112
MDC IDC MSMT BATTERY REMAINING LONGEVITY: 33 mo
MDC IDC MSMT LEADCHNL RV PACING THRESHOLD AMPLITUDE: 0.5 V
MDC IDC MSMT LEADCHNL RV PACING THRESHOLD PULSEWIDTH: 0.4 ms
MDC IDC MSMT LEADCHNL RV SENSING INTR AMPL: 6.7 mV
MDC IDC PG IMPLANT DT: 20100112
MDC IDC SET LEADCHNL RV PACING PULSEWIDTH: 0.4 ms
MDC IDC STAT BRADY RV PERCENT PACED: 91 %
Pulse Gen Model: 5826
Pulse Gen Serial Number: 1284954

## 2017-02-18 NOTE — Progress Notes (Signed)
PCP: Anda Kraft, MD  [name] is a 83 y.o. male who presents today for routine electrophysiology followup.  Since last being seen in our clinic, the patient reports doing very well.  He is not as active due to gait instability.  Today, he denies symptoms of palpitations, chest pain, shortness of breath,  lower extremity edema, dizziness, presyncope, or syncope.  The patient is otherwise without complaint today.   Past Medical History:  Diagnosis Date  . CAD (coronary artery disease) 09/2008  . Cancer ([place])    basal cell carcinoma  . CHF (congestive heart failure) ([place])   . Complication of anesthesia    06/12/11 anesthesia made legs asleep also and went into chf and ended up in ICU after gallbladder surgery  . Diastolic dysfunction    hx of chf due to fluid overload 9/12   . Dysrhythmia    atrial fibrillation  . GERD (gastroesophageal reflux disease)   . History of blood product transfusion    d/t bleeding ulcer  . Hypertension   . Macular degeneration (senile) of retina   . Persistent atrial fibrillation ([place])   . PONV (postoperative nausea and vomiting)   . Presence of permanent cardiac pacemaker 2010  . Shortness of breath dyspnea    exertion  . Sick sinus syndrome (HCC)    St. Jude  . Syncope    resolved s/p PPM   Past Surgical History:  Procedure Laterality Date  . CHOLECYSTECTOMY  06/12/11  . INGUINAL HERNIA REPAIR Left 10/06/2014   Procedure: OPEN REPAIR LEFT INGUINAL HERNIA ;  Surgeon: Fanny Skates, MD;  Location: [place];  Service: General;  Laterality: Left;  . INSERTION OF MESH Left 10/06/2014   Procedure: INSERTION OF MESH;  Surgeon: Fanny Skates, MD;  Location: [place];  Service: General;  Laterality: Left;  . LAPAROSCOPIC CHOLECYSTECTOMY W/ CHOLANGIOGRAPHY  06/12/2011  . PACEMAKER INSERTION  09/28/08   S[facility] [place] XL DR  . perforated ulcer    . right inguinal hernia repair  11/19/2002   with mesh  . TRANSURETHRAL RESECTION OF BLADDER TUMOR  09/06/2011   Procedure: TRANSURETHRAL RESECTION OF BLADDER TUMOR (TURBT);  Surgeon: Ailene Rud, MD;  Location: [facility];  Service: Urology;  Laterality: N/A;  . TRANSURETHRAL RESECTION OF PROSTATE  95/05/3266  . umblical hernia  10/11/5807    ROS- all systems are reviewed and negative except as per HPI above  Current Outpatient Prescriptions  Medication Sig Dispense Refill  . apixaban (ELIQUIS) 5 MG TABS tablet Take 1 tablet (5 mg total) by mouth 2 (two) times daily. 180 tablet 3  . cholecalciferol (VITAMIN D) 1000 units tablet Take 5,000 Units by mouth daily.    . Cyanocobalamin (VITAMIN B-12 CR PO) Take 1 tablet by mouth once a week.     . famotidine (PEPCID) 20 MG tablet Take 20 mg by mouth 2 (two) times daily.    . magnesium 30 MG tablet Take 30 mg by mouth every evening.    . TraMADol HCl 100 MG CP24 Take 100 mg by mouth daily. 30 capsule 5  . triamcinolone cream (KENALOG) 0.1 % Apply 1 application topically daily as needed (for irritation).      No current facility-administered medications for this visit.     Physical Exam: Vitals:   02/18/17 1055  BP: 139/88  Pulse: 63  SpO2: 94%  Weight: 174 lb 6.4 oz (79.1 kg)  Height: 5\' 10"  (1.778 m)    GEN- The patient is elderly appearing,  alert and oriented x 3 today.   Head- normocephalic, atraumatic Eyes-  Sclera clear, conjunctiva pink Ears- hearing intact Oropharynx- clear Lungs- Clear to ausculation bilaterally, normal work of breathing Chest- pacemaker pocket is well healed Heart- Regular rate and rhythm (paced) GI- soft, NT, ND, + BS Extremities- no clubbing, cyanosis, or edema  Pacemaker interrogation- reviewed in detail today,  See PACEART report  Assessment and Plan:  1. Bradycardia Normal pacemaker function See Pace Art report No changes today TTMs every 3 months  2. Permanent afib Rate controlled On eliquis PCP follows creatinine for dosing  3. HTN Stable No change required today  Return in 12 months to  see EP NP  Thompson Grayer MD, [facility] 02/18/2017 11:45 AM

## 2017-02-18 NOTE — Patient Instructions (Addendum)
Medication Instructions:  Your physician recommends that you continue on your current medications as directed. Please refer to the Current Medication list given to you today.    Labwork: None ordered   Testing/Procedures: None ordered'  Follow-Up: Your physician wants you to follow-up in: 12 months with Amber Seiler, NP. You will receive a reminder letter in the mail two months in advance. If you don't receive a letter, please call our office to schedule the follow-up appointment.   Any Other Special Instructions Will Be Listed Below (If Applicable).     If you need a refill on your cardiac medications before your next appointment, please call your pharmacy.   

## 2017-02-25 ENCOUNTER — Telehealth: Payer: Self-pay | Admitting: Internal Medicine

## 2017-02-25 NOTE — Telephone Encounter (Signed)
ROI re-faxed to [facility].

## 2017-02-27 ENCOUNTER — Telehealth: Payer: Self-pay | Admitting: Internal Medicine

## 2017-02-27 NOTE — Telephone Encounter (Signed)
Records rec from [facility] ([place]) placed in [facility] doc box.

## 2017-03-15 ENCOUNTER — Encounter: Payer: Self-pay | Admitting: Internal Medicine

## 2017-03-15 DIAGNOSIS — R001 Bradycardia, unspecified: Secondary | ICD-10-CM | POA: Diagnosis not present

## 2017-03-18 ENCOUNTER — Encounter: Payer: Medicare Other | Admitting: Internal Medicine

## 2017-03-19 ENCOUNTER — Ambulatory Visit: Payer: Medicare Other | Admitting: Podiatry

## 2017-04-14 ENCOUNTER — Other Ambulatory Visit: Payer: Self-pay | Admitting: Internal Medicine

## 2017-04-15 NOTE — Telephone Encounter (Signed)
Request received for Eliquis 5mg ; pt is 83 yrs old, Wt-79.1kg, Crea-0.95 on 08/02/2016, last seen by Dr. Rayann Heman on 02/18/17; will send in refill request to requested Pharmacy.

## 2017-04-15 NOTE — Telephone Encounter (Signed)
Age 83 years 02/18/2017 Wt 79.1 kg  Saw Dr Rayann Heman on 02/18/2017  08/02/2016 SrCr 0.95 08/02/2016 Hgb 14.6 Hct 43.9  Refill done for Eliquis 5mg  every 12 hours as requested

## 2017-04-17 ENCOUNTER — Other Ambulatory Visit: Payer: Self-pay | Admitting: Pharmacist

## 2017-04-17 MED ORDER — APIXABAN 5 MG PO TABS: 5.0000 mg | ORAL_TABLET | Freq: Two times a day (BID) | ORAL | 1 refills | Status: DC

## 2017-04-24 ENCOUNTER — Ambulatory Visit (INDEPENDENT_AMBULATORY_CARE_PROVIDER_SITE_OTHER): Payer: Medicare Other | Admitting: Podiatry

## 2017-04-24 ENCOUNTER — Encounter: Payer: Self-pay | Admitting: Podiatry

## 2017-04-24 DIAGNOSIS — M79676 Pain in unspecified toe(s): Secondary | ICD-10-CM | POA: Diagnosis not present

## 2017-04-24 DIAGNOSIS — B351 Tinea unguium: Secondary | ICD-10-CM | POA: Diagnosis not present

## 2017-04-24 NOTE — Progress Notes (Signed)
Patient ID: [name], male   DOB: [birth year], 83 y.o.   MRN: [id number]    Subjective: This patient presents today complaining of painful toenails and walking wearing shoes and requests toenail debridement.  Objective:  Orientated 3 DP pulses 2/4 right and 0/4 left PT pulses 1/4 bilaterally Reflex immediate bilaterally Sensation to 10 g monofilament wire intact 4/5 bilaterally Vibratory sensation reactive bilaterally Ankle reflexes reactive bilaterally No skin lesions bilaterally Atrophic skin with absent hair growth bilaterally Varicosities ankles bilaterally The toenails elongated, brittle, deformed, discolored and tender tract palpation 6-10 Nose no restriction ankle, subtalar, midtarsal joints bilaterally Patient walks slowly with a roller walker  Assessment: Decrease pedal pulses suggestive of peripheral arterial disease (no history of claudication or open skin lesions) Symptomatic mycotic toenails 6-10  Plan: I advised patient that there is no evidence of pigmented lesions, bilaterally The toenails 10 were debrided mechanically and electrically without any bleeding  Reappoint at patient's request or 3 month intervals

## 2017-06-14 ENCOUNTER — Emergency Department (HOSPITAL_COMMUNITY)
Admission: EM | Admit: 2017-06-14 | Discharge: 2017-06-14 | Disposition: A | Discharge status: Home / Self Care | Payer: Medicare Other | Attending: Emergency Medicine | Admitting: Emergency Medicine

## 2017-06-14 ENCOUNTER — Emergency Department (HOSPITAL_COMMUNITY): Payer: Medicare Other

## 2017-06-14 ENCOUNTER — Encounter (HOSPITAL_COMMUNITY): Payer: Self-pay | Admitting: *Deleted

## 2017-06-14 DIAGNOSIS — Z950 Presence of cardiac pacemaker: Secondary | ICD-10-CM | POA: Diagnosis not present

## 2017-06-14 DIAGNOSIS — I2510 Atherosclerotic heart disease of native coronary artery without angina pectoris: Secondary | ICD-10-CM | POA: Diagnosis not present

## 2017-06-14 DIAGNOSIS — Z87891 Personal history of nicotine dependence: Secondary | ICD-10-CM | POA: Diagnosis not present

## 2017-06-14 DIAGNOSIS — I5032 Chronic diastolic (congestive) heart failure: Secondary | ICD-10-CM | POA: Insufficient documentation

## 2017-06-14 DIAGNOSIS — I481 Persistent atrial fibrillation: Secondary | ICD-10-CM | POA: Insufficient documentation

## 2017-06-14 DIAGNOSIS — E782 Mixed hyperlipidemia: Secondary | ICD-10-CM | POA: Insufficient documentation

## 2017-06-14 DIAGNOSIS — R42 Dizziness and giddiness: Secondary | ICD-10-CM | POA: Diagnosis present

## 2017-06-14 DIAGNOSIS — Z79899 Other long term (current) drug therapy: Secondary | ICD-10-CM | POA: Insufficient documentation

## 2017-06-14 DIAGNOSIS — I110 Hypertensive heart disease with heart failure: Secondary | ICD-10-CM | POA: Diagnosis not present

## 2017-06-14 DIAGNOSIS — R269 Unspecified abnormalities of gait and mobility: Secondary | ICD-10-CM | POA: Diagnosis not present

## 2017-06-14 LAB — URINALYSIS, ROUTINE W REFLEX MICROSCOPIC
BACTERIA UA: NONE SEEN
BILIRUBIN URINE: NEGATIVE
Glucose, UA: NEGATIVE mg/dL
Hgb urine dipstick: NEGATIVE
KETONES UR: NEGATIVE mg/dL
Leukocytes, UA: NEGATIVE
NITRITE: NEGATIVE
PH: 5.5 (ref 5.0–8.0)
Protein, ur: NEGATIVE mg/dL
SQUAMOUS EPITHELIAL / LPF: NONE SEEN

## 2017-06-14 LAB — I-STAT CHEM 8, ED
BUN: 15 mg/dL (ref 6–20)
CREATININE: 0.8 mg/dL (ref 0.61–1.24)
Calcium, Ion: 1.17 mmol/L (ref 1.15–1.40)
Chloride: 103 mmol/L (ref 101–111)
GLUCOSE: 115 mg/dL — AB (ref 65–99)
HEMATOCRIT: 46 % (ref 39.0–52.0)
HEMOGLOBIN: 15.6 g/dL (ref 13.0–17.0)
Potassium: 3.5 mmol/L (ref 3.5–5.1)
Sodium: 140 mmol/L (ref 135–145)
TCO2: 25 mmol/L (ref 22–32)

## 2017-06-14 LAB — COMPREHENSIVE METABOLIC PANEL
ALK PHOS: 76 U/L (ref 38–126)
ALT: 16 U/L — AB (ref 17–63)
AST: 19 U/L (ref 15–41)
Albumin: 3.5 g/dL (ref 3.5–5.0)
Anion gap: 6 (ref 5–15)
BILIRUBIN TOTAL: 0.7 mg/dL (ref 0.3–1.2)
BUN: 12 mg/dL (ref 6–20)
CALCIUM: 8.7 mg/dL — AB (ref 8.9–10.3)
CO2: 25 mmol/L (ref 22–32)
CREATININE: 0.85 mg/dL (ref 0.61–1.24)
Chloride: 107 mmol/L (ref 101–111)
Glucose, Bld: 117 mg/dL — ABNORMAL HIGH (ref 65–99)
Potassium: 3.6 mmol/L (ref 3.5–5.1)
Sodium: 138 mmol/L (ref 135–145)
TOTAL PROTEIN: 6.8 g/dL (ref 6.5–8.1)

## 2017-06-14 LAB — DIFFERENTIAL
BASOS PCT: 0 %
Basophils Absolute: 0 10*3/uL (ref 0.0–0.1)
EOS ABS: 0 10*3/uL (ref 0.0–0.7)
EOS PCT: 1 %
Lymphocytes Relative: 16 %
Lymphs Abs: 0.9 10*3/uL (ref 0.7–4.0)
MONO ABS: 0.8 10*3/uL (ref 0.1–1.0)
Monocytes Relative: 13 %
NEUTROS ABS: 4.1 10*3/uL (ref 1.7–7.7)
Neutrophils Relative %: 70 %

## 2017-06-14 LAB — CBC
HCT: 44 % (ref 39.0–52.0)
Hemoglobin: 14 g/dL (ref 13.0–17.0)
MCH: 28.7 pg (ref 26.0–34.0)
MCHC: 31.8 g/dL (ref 30.0–36.0)
MCV: 90.3 fL (ref 78.0–100.0)
PLATELETS: 192 10*3/uL (ref 150–400)
RBC: 4.87 MIL/uL (ref 4.22–5.81)
RDW: 14.3 % (ref 11.5–15.5)
WBC: 5.8 10*3/uL (ref 4.0–10.5)

## 2017-06-14 LAB — I-STAT TROPONIN, ED: TROPONIN I, POC: 0 ng/mL (ref 0.00–0.08)

## 2017-06-14 LAB — CBG MONITORING, ED: Glucose-Capillary: 118 mg/dL — ABNORMAL HIGH (ref 65–99)

## 2017-06-14 LAB — PROTIME-INR
INR: 1.22
PROTHROMBIN TIME: 15.3 s — AB (ref 11.4–15.2)

## 2017-06-14 LAB — AMMONIA: Ammonia: 18 umol/L (ref 9–35)

## 2017-06-14 LAB — APTT: aPTT: 25 seconds (ref 24–36)

## 2017-06-14 MED ORDER — CARBIDOPA-LEVODOPA 25-100 MG PO TABS: 1.0000 | ORAL_TABLET | Freq: Three times a day (TID) | ORAL | 0 refills | Status: DC

## 2017-06-14 MED ORDER — CARBIDOPA-LEVODOPA ER 25-100 MG PO TBCR
1.0000 | EXTENDED_RELEASE_TABLET | Freq: Three times a day (TID) | ORAL | Status: DC
  Administered 2017-06-14: 1 via ORAL

## 2017-06-14 MED ORDER — APIXABAN 5 MG PO TABS
5.0000 mg | ORAL_TABLET | Freq: Two times a day (BID) | ORAL | Status: DC
  Administered 2017-06-14: 5 mg via ORAL
  Filled 2017-06-14: qty 1

## 2017-06-14 MED ORDER — CARBIDOPA-LEVODOPA 10-100 MG PO TABS
1.0000 | ORAL_TABLET | Freq: Once | ORAL | Status: DC
  Filled 2017-06-14: qty 1

## 2017-06-14 MED ORDER — CARBIDOPA-LEVODOPA ER 25-100 MG PO TBCR
1.0000 | EXTENDED_RELEASE_TABLET | Freq: Two times a day (BID) | ORAL | Status: DC
  Administered 2017-06-14: 1 via ORAL
  Filled 2017-06-14 (×2): qty 1

## 2017-06-14 MED ORDER — FAMOTIDINE 20 MG PO TABS
20.0000 mg | ORAL_TABLET | Freq: Every day | ORAL | Status: DC
  Administered 2017-06-14: 20 mg via ORAL
  Filled 2017-06-14: qty 1

## 2017-06-14 MED ORDER — FENTANYL CITRATE (PF) 100 MCG/2ML IJ SOLN: 25.0000 ug | Freq: Once | INTRAMUSCULAR | Status: DC

## 2017-06-14 NOTE — ED Triage Notes (Signed)
Pt in from home c/o feeling off balanced last night at 22:00, pt denies slurred speech and pain, pt ventricular paced, pt takes Eliquis, pt A&O x4

## 2017-06-14 NOTE — Progress Notes (Addendum)
CSW met with CM and consulted with pt's EDP over the appropriateness of placing the pt into a SNF from the ED.   CSW staffed pt's case with CSW Asst Director who is reviewing pt's chart now.  CSW and CM met with pt an pt's wife in person and pt's wife stated she and the pt desire SNF placement for the pt specifically to [facility] in [place].  CSW requested and pt's wife and pt gave verbal consent to the CSW to use pt's name to access availability of SNF beds if pt's chart was reviewed and IF SNF placement WERE found to be appropriate.  CSW called [facility] in [facility] and spoke to [place] in admissions who stated there were no available beds and that although two pt's were D/C'ing from [facility] over the weekend two placements would be en route this weekend to take the two available beds.  Further, Colletta [place] stated she expected no openings until at best early next week, but that this wasn't for sure.   CSW spoke with Santiago Glad at [facility] who stated [facility] has an available bed.  CSW reviewing with CSW Asst Director  CSW spoke to [place] Asst Director who stated if [facility] would accept the pt then the pt can D/C to [facility].  Pt stated they would go to [place] initially but after the referral was accepted and pt was preparing for transport and D/C orders signed family changed their mind and decided to return home via [place] and not to go to [facility].  CSW notified Santiago Glad at [facility] and updated EDP who placed a [facility] consult with PT/OT and [facility] SWK for the pt to be contacted by [facility] tomorrow (Sat 9/29).  CSW will continue to follow.  [name]. Delinda Malan, LCSW, LCAS, CSI Clinical Social Worker Ph: [phone]

## 2017-06-14 NOTE — Discharge Instructions (Addendum)
Our Education officer, museum and case managers worked overtime to get you placed. Unfortunately, there is no bed at [facility] and they placed you at [facility]. You have refused to be placed at [facility] and our plan was to have our case manager see you in the morning for other options - but you have expressed despite to go home for now, understanding the risk of fall and other injuries.  We have ordered home health and social work. Return to the ER if you feel it is dangerous to be at home.  Please prevent fall.

## 2017-06-14 NOTE — NC FL2 (Signed)
[place] LEVEL OF CARE SCREENING TOOL     IDENTIFICATION  Patient Name: [name] Birthdate: October 17, 1930 Sex: male Admission Date (Current Location): 06/14/2017  [facility] and [place] Number:  Herbalist and Address:  [facility], [place] [address], [place],  [address]      Provider Number: [id number]  Attending Physician Name and Address:  Carmin Muskrat, MD  Relative Name and Phone Number:       Current Level of Care: Hospital Recommended Level of Care: [place] Prior Approval Number:    Date Approved/Denied:   PASRR Number: [id number]  Discharge Plan: SNF    Current Diagnoses: Patient Active Problem List   Diagnosis Date Noted  . Intracranial aneurysm 11/28/2016  . Gait disturbance 11/06/2016  . Muscle fatigue 11/06/2016  . Post herpetic neuralgia 11/06/2016  . Left inguinal hernia 10/06/2014  . Shortness of breath 01/03/2014  . Pacemaker-St.Jude 07/09/2012  . Permanent atrial fibrillation ([place]) 10/20/2010  . HYPERLIPIDEMIA-MIXED 10/26/2009  . Macular degeneration 12/28/2008  . CAD, NATIVE VESSEL 12/28/2008  . GERD 12/28/2008  . SYNCOPE 12/28/2008  . Hypertension 12/28/2008  . Sick sinus syndrome ([place]) 12/28/2008    Orientation RESPIRATION BLADDER Height & Weight     Self, Time, Situation, Place  Normal Continent Weight:   Height:     BEHAVIORAL SYMPTOMS/MOOD NEUROLOGICAL BOWEL NUTRITION STATUS      Continent    AMBULATORY STATUS COMMUNICATION OF NEEDS Skin   Limited Assist Verbally Normal                       Personal Care Assistance Level of Assistance  Bathing, Dressing Bathing Assistance: Limited assistance   Dressing Assistance: Limited assistance     Functional Limitations Info             SPECIAL CARE FACTORS FREQUENCY                       Contractures Contractures Info: Not present    Additional Factors Info  Code Status, Allergies Code  Status Info: Prior Allergies Info: Hydroxyzine           Current Medications (06/14/2017):  This is the current hospital active medication list Current Facility-Administered Medications  Medication Dose Route Frequency Provider Last Rate Last Dose  . apixaban (ELIQUIS) tablet 5 mg  5 mg Oral BID Mu, Frank, MD      . Carbidopa-Levodopa ER (SINEMET CR) 25-100 MG tablet controlled release 1 tablet  1 tablet Oral BID Mu, Frank, MD      . famotidine (PEPCID) tablet 20 mg  20 mg Oral Daily Mu, Frank, MD      . fentaNYL (SUBLIMAZE) injection 25 mcg  25 mcg Intravenous Once Payton Emerald, MD       Current Outpatient Prescriptions  Medication Sig Dispense Refill  . acetaminophen (TYLENOL) 325 MG tablet Take 650 mg by mouth every 6 (six) hours as needed for mild pain.    [name] apixaban (ELIQUIS) 5 MG TABS tablet Take 1 tablet (5 mg total) by mouth 2 (two) times daily. 180 tablet 1  . cholecalciferol (VITAMIN D) 1000 units tablet Take 2,000 Units by mouth daily.     . Cyanocobalamin (VITAMIN B-12 CR PO) Take 1 tablet by mouth daily.     . famotidine (PEPCID) 20 MG tablet Take 20 mg by mouth daily.     [name] triamcinolone cream (KENALOG) 0.1 % Apply  1 application topically daily.     . TraMADol HCl 100 MG CP24 Take 100 mg by mouth daily. (Patient not taking: Reported on 06/14/2017) 30 capsule 5     Discharge Medications: Please see discharge summary for a list of discharge medications.  Relevant Imaging Results:  Relevant Lab Results:   Additional Information [id number]  Alphonse Guild Jing Howatt, LCSWA

## 2017-06-14 NOTE — Progress Notes (Signed)
CSW spoke to [place] Asst Director who stated if [facility] would accept the pt then the pt can D/C to [place].  Pt stated they would go to [facility] initially but after the referral was accepted and pt was preparing for transport and D/C orders signed family changed their mind and decided to return home via [place] and not to go to [facility].  CSW notified Santiago Glad at [facility] and updated EDP who placed a [facility] consult with PT/OT and [facility] SWK for the pt to be contacted by [facility] tomorrow (Sat 9/29).  Please reconsult if future social work needs arise.  CSW signing off, as social work intervention is no longer needed.  Alphonse Guild. Tymeer Vaquera, LCSW, LCAS, CSI Clinical Social Worker Ph: [phone]

## 2017-06-14 NOTE — ED Notes (Signed)
PTAR  Contacted for transport to home

## 2017-06-14 NOTE — Consult Note (Signed)
Referring Physician: Dr Vanita Panda    Chief Complaint:  Balance problems and slurred speech  HPI: [name] is an 83 y.o. male with a history of coronary artery disease, congestive heart failure, atrial fibrillation on Eliquis, permanent cardiac pacemaker for sick sinus syndrome (St Jude), and hypertension who presents to the [facility] emergency department for evaluation of bilateral lower extremity weakness. A CT scan of the head revealed atrophic changes and chronic white matter ischemic changes without acute abnormality. The patient is unable to have an MRI due to his permanent pacemaker.  Most of the history was obtained from the patient's wife with some input from the patient himself. The patient has had waxing and waning lower extremity weakness for at least 2 years. He ambulates with a walker. Last night his wife took him into the bathroom for a bath at approximately 6 PM. The patient was able to walk into the bathroom using his walker. After his bath he was unable to stand up. Both of his legs felt weak - he denied any lateralizing deficits. His walker has a seat and his wife was able to help him into the seat. She was then able to roll him out of the bathroom. Since that time his legs have gotten a little stronger but he still feels they are weaker than normal. The patient denies ever having back surgery or trauma.  His primary care physician, Dr. Wilson Singer, recently retired, has worked the patient up in the past for this weakness but according to the patient a cause was not found. The patient has also had some balance problems for several years which has been attributed to his macular degeneration. The patient denies any recent speech problems which had initially been reported. According to his wife Mr. Musich has also been evaluated by Dr.Sater on several occasions for lower extremity weakness. Dr. Felecia Shelling last saw the patient in May 2018 and felt that the patient's gait problems were  multifactorial although he appeared stable at that time. The patient has also had home health physical therapy in the past.  Date last known well: Date: 06/13/2017 Time last known well: Time: 18:00 tPA Given: No: No localizing deficits.  Past Medical History:  Diagnosis Date  . CAD (coronary artery disease) 09/2008  . Cancer ([place])    basal cell carcinoma  . CHF (congestive heart failure) ([place])   . Complication of anesthesia    06/12/11 anesthesia made legs asleep also and went into chf and ended up in ICU after gallbladder surgery  . Diastolic dysfunction    hx of chf due to fluid overload 9/12   . Dysrhythmia    atrial fibrillation  . GERD (gastroesophageal reflux disease)   . History of blood product transfusion    d/t bleeding ulcer  . Hypertension   . Macular degeneration (senile) of retina   . Persistent atrial fibrillation ([place])   . PONV (postoperative nausea and vomiting)   . Presence of permanent cardiac pacemaker 2010  . Shortness of breath dyspnea    exertion  . Sick sinus syndrome ([facility])    St. Jude  . Syncope    resolved s/p PPM    Past Surgical History:  Procedure Laterality Date  . CHOLECYSTECTOMY  06/12/11  . INGUINAL HERNIA REPAIR Left 10/06/2014   Procedure: OPEN REPAIR LEFT INGUINAL HERNIA ;  Surgeon: Ryan Skates, MD;  Location: [place];  Service: General;  Laterality: Left;  . INSERTION OF MESH Left 10/06/2014   Procedure: INSERTION  OF MESH;  Surgeon: Ryan Skates, MD;  Location: [facility];  Service: General;  Laterality: Left;  . LAPAROSCOPIC CHOLECYSTECTOMY W/ CHOLANGIOGRAPHY  06/12/2011  . PACEMAKER INSERTION  09/28/08   [facility] [place] XL DR  . perforated ulcer    . right inguinal hernia repair  11/19/2002   with mesh  . TRANSURETHRAL RESECTION OF BLADDER TUMOR  09/06/2011   Procedure: TRANSURETHRAL RESECTION OF BLADDER TUMOR (TURBT);  Surgeon: Ailene Rud, MD;  Location: [facility];  Service: Urology;  Laterality: N/A;  . TRANSURETHRAL RESECTION OF  PROSTATE  79/89/2119  . umblical hernia  01/02/4080    Family History  Problem Relation Age of Onset  . Heart attack Mother 60       MI  . Heart failure Father   . Heart attack Brother        Late 17s  . Cancer Sister    No family history of strokes.  Social History:  reports that he quit smoking about 58 years ago. He has never used smokeless tobacco. He reports that he does not drink alcohol or use drugs.  Allergies:  Allergies  Allergen Reactions  . Hydroxyzine Anxiety    Medications:  Current Facility-Administered Medications  Medication Dose Route Frequency Provider Last Rate Last Dose  . fentaNYL (SUBLIMAZE) injection 25 mcg  25 mcg Intravenous Once Ryan Plate, MD       Current Outpatient Prescriptions  Medication Sig Dispense Refill  . acetaminophen (TYLENOL) 325 MG tablet Take 650 mg by mouth every 6 (six) hours as needed for mild pain.    [name] apixaban (ELIQUIS) 5 MG TABS tablet Take 1 tablet (5 mg total) by mouth 2 (two) times daily. 180 tablet 1  . cholecalciferol (VITAMIN D) 1000 units tablet Take 2,000 Units by mouth daily.     . Cyanocobalamin (VITAMIN B-12 CR PO) Take 1 tablet by mouth daily.     . famotidine (PEPCID) 20 MG tablet Take 20 mg by mouth daily.     [name] triamcinolone cream (KENALOG) 0.1 % Apply 1 application topically daily.     . TraMADol HCl 100 MG CP24 Take 100 mg by mouth daily. (Patient not taking: Reported on 06/14/2017) 30 capsule 5     ROS: History obtained from the patient and his wife  General ROS: negative for - chills, fatigue, fever, night sweats, weight gain or weight loss Psychological ROS: negative for - behavioral disorder, hallucinations, mood swings or suicidal ideation Positive for memory deficits. Ophthalmic ROS: negative for - blurry vision, double vision, eye pain or loss of vision ENT ROS: negative for - epistaxis, nasal discharge, oral lesions, sore throat, tinnitus or vertigo Allergy and Immunology ROS: negative for - hives  or itchy/watery eyes Hematological and Lymphatic ROS: negative for - bleeding problems, bruising or swollen lymph nodes Endocrine ROS: negative for - galactorrhea, hair pattern changes, polydipsia/polyuria or temperature intolerance Respiratory ROS: negative for - cough, hemoptysis, shortness of breath or wheezing Cardiovascular ROS: negative for - chest pain, dyspnea on exertion, edema or irregular heartbeat Gastrointestinal ROS: negative for - abdominal pain, diarrhea, hematemesis, nausea/vomiting or stool incontinence Genito-Urinary ROS: negative for - dysuria, hematuria, incontinence or urinary frequency/urgency Musculoskeletal ROS: negative for - joint swelling or muscular weakness Neurological ROS: as noted in HPI. Positive for balance problems felt secondary to visual deficits. Dermatological ROS: negative for rash and skin lesion changes. Positive for postherpetic neuralgia.   Physical Examination: Blood pressure (!) 151/91, pulse 68, temperature 98.5 F (36.9 C), temperature source  Oral, resp. rate 15, SpO2 95 %.  General - pleasant 83 year old male somewhat hard of hearing. Heart - Regular rate and rhythm - no murmer appreciated Lungs - Clear to auscultation Abdomen - Soft - non tender Extremities - Distal pulses weak to absent - no edema Skin - Warm and dry  Neurologic Examination:  Mental Status:  Alert, oriented except to month and day of month - thought content appropriate. Speech without evidence of dysarthria or aphasia. Able to follow 3 step commands without difficulty.  Cranial Nerves:  II-bilateral visual fields intact. Decreased visual acuity bilaterally III/IV/VI-Pupils were equal and reacted. Extraocular movements were full.  V/VII-no facial numbness and no facial weakness.  VIII-hearing normal.  X-normal speech and symmetrical palatal movement.  XII-midline tongue extension  Motor: 4-5/5 strength symmetrical throughout.  Muscle tone normal throughout. Sensory:  Intact to light touch in all extremities. Deep Tendon Reflexes: 2/4 throughout Plantars: Downgoing bilaterally  Cerebellar: Normal finger to nose with bilateral tremor and heel to shin bilaterally. Gait: not tested   Laboratory Studies:  Basic Metabolic Panel:  Recent Labs Lab 06/14/17 0900 06/14/17 0911  NA 138 140  K 3.6 3.5  CL 107 103  CO2 25  --   GLUCOSE 117* 115*  BUN 12 15  CREATININE 0.85 0.80  CALCIUM 8.7*  --     Liver Function Tests:  Recent Labs Lab 06/14/17 0900  AST 19  ALT 16*  ALKPHOS 76  BILITOT 0.7  PROT 6.8  ALBUMIN 3.5   No results for input(s): LIPASE, AMYLASE in the last 168 hours.  Recent Labs Lab 06/14/17 0901  AMMONIA 18    CBC:  Recent Labs Lab 06/14/17 0900 06/14/17 0911  WBC 5.8  --   NEUTROABS 4.1  --   HGB 14.0 15.6  HCT 44.0 46.0  MCV 90.3  --   PLT 192  --     Cardiac Enzymes: No results for input(s): CKTOTAL, CKMB, CKMBINDEX, TROPONINI in the last 168 hours.  BNP: Invalid input(s): POCBNP  CBG:  Recent Labs Lab 06/14/17 0832  GLUCAP 118*    Microbiology: Results for orders placed or performed during the hospital encounter of 09/03/11  Surgical pcr screen     Status: None   Collection Time: 09/03/11  9:23 AM  Result Value Ref Range Status   MRSA, PCR NEGATIVE NEGATIVE Final   Staphylococcus aureus NEGATIVE NEGATIVE Final    Comment:        The Xpert SA Assay (FDA approved for NASAL specimens only), is one component of a comprehensive surveillance program.  It is not intended to diagnose infection nor to guide or monitor treatment.    Coagulation Studies:  Recent Labs  06/14/17 0900  LABPROT 15.3*  INR 1.22    Urinalysis: No results for input(s): COLORURINE, LABSPEC, PHURINE, GLUCOSEU, HGBUR, BILIRUBINUR, KETONESUR, PROTEINUR, UROBILINOGEN, NITRITE, LEUKOCYTESUR in the last 168 hours.  Invalid input(s): APPERANCEUR  Lipid Panel:    Component Value Date/Time   CHOL  09/27/2008 0419     183        ATP III CLASSIFICATION:  <200     mg/dL   Desirable  200-239  mg/dL   Borderline High  >=240    mg/dL   High          TRIG 74 09/27/2008 0419   HDL 65 09/27/2008 0419   CHOLHDL 2.8 09/27/2008 0419   VLDL 15 09/27/2008 0419   LDLCALC (H) 09/27/2008 0419    103  Total Cholesterol/HDL:CHD Risk Coronary Heart Disease Risk Table                     Men   Women  1/2 Average Risk   3.4   3.3  Average Risk       5.0   4.4  2 X Average Risk   9.6   7.1  3 X Average Risk  23.4   11.0        Use the calculated Patient Ratio above and the CHD Risk Table to determine the patient's CHD Risk.        ATP III CLASSIFICATION (LDL):  <100     mg/dL   Optimal  100-129  mg/dL   Near or Above                    Optimal  130-159  mg/dL   Borderline  160-189  mg/dL   High  >190     mg/dL   Very High    HgbA1C: No results found for: HGBA1C  Urine Drug Screen:  No results found for: LABOPIA, COCAINSCRNUR, LABBENZ, AMPHETMU, THCU, LABBARB  Alcohol Level: No results for input(s): ETH in the last 168 hours.  Other results: EKG: V paced rhythm 63 BPM. Please see cardiology reading.  Imaging:  Ct Head Wo Contrast 06/14/2017 IMPRESSION:  Atrophic changes and chronic white matter ischemic changes without acute abnormality.      Assessment: 83 y.o. male  with coronary artery disease, congestive heart failure, atrial fibrillation (on Eliquis), permanent cardiac pacemaker for sick sinus syndrome (St Jude),post herpetic neuralgia and hypertension with a two-year history of bilateral lower extremity weakness now presenting with an episode of increased lower extremity weakness. Consider further imaging of spine.  Await assessment and plan from Dr. Leonel Ramsay.   Mikey Bussing PA-C [facility] Pager [phone] 06/14/2017, 1:21 PM     I have seen the patient review the above note. I think he may have mild right facial weakness, but his wife states that his  smile looks the same as has for quite some time.("He only really smiles with 1 side") Of note, he has a pill-rolling tremor of his right arm. Per his wife, his steps have become smaller and shuffled. He does not write because of vision changes, therefore it is unclear if his handwriting is changed. He does appear to have a masked facies to me. I suspect that he may have mild parkinsonism contributing to his gait dysfunction. I am not confident in this diagnosis, and a treatment trial with help to further delineate this.  The gait dysfunction that he is describing is a progressive over the past few years with I think him finally getting to a functionality limiting place, rather than acute change.  I would favor starting carbidopa levodopa 25-100 3 times a day. He will also need follow-up with PT and outpatient neurology.  Roland Rack, MD [facility] [phone]  If 7pm- 7am, please page neurology on call as listed in [place].

## 2017-06-14 NOTE — ED Notes (Signed)
Attempted to ambulate pt with the aide of a walker ( pt states he uses walker at home) and stand by assist. Pt stood up strong and steady. Pt had trouble coordinating the walker device and walking. Pt took 10 small steps tolerating well. Then Pt had trouble moving right foot at which time ambulation stopped and Staff aided this tech getting pt back to bed. Pt resting comfortably with wife at bedside.

## 2017-06-14 NOTE — Care Management Note (Signed)
Case Management Note  CM consulted for SNF placement.  Spoke with Dr Rene Kocher who was going to admit pt for placement.  Advised him not to admit for that reason.  CM and CSW went to [facility] ED and spoke with pt and wife.  Wife reports they have used [facility] in the past.  Determination was made for SNF placement per EDP.  CM expedited last minute PT eval.  Dr Kathrynn Humble updated.  No further CM needs noted at this time.

## 2017-06-14 NOTE — ED Notes (Signed)
Pt and wife decided that they will go home with the assistance of neighbors, and pt will try to get into [place] facility at the beginning of the week.

## 2017-06-14 NOTE — ED Provider Notes (Signed)
[facility] DEPT Provider Note   CSN: [id number] Arrival date & time: 06/14/17  4098     History   Chief Complaint Chief Complaint  Patient presents with  . Dizziness    HPI [name] is a 83 y.o. male.  83 year old male history of CAD, CHF, s/p pacemaker, macular degeneration, and A. fib on Eliquis who p/w BLE weakness.  Worsening since 2100 last evening.  Feels his balance is off with difficulty ambulating. Possible relation to chronic poor eyesight from macular degeneration. Denies acute vision loss or blurriness. Has chronic ambulatory difficulty following shingles 2 years ago and enduring postherpetic neuralgia. Uses walker at baseline. Denies falls. No focal numbness or weakness. No fevers or other infectious sx.  The history is provided by the patient, the spouse and medical records. No language interpreter was used.    Past Medical History:  Diagnosis Date  . CAD (coronary artery disease) 09/2008  . Cancer ([place])    basal cell carcinoma  . CHF (congestive heart failure) ([place])   . Complication of anesthesia    06/12/11 anesthesia made legs asleep also and went into chf and ended up in ICU after gallbladder surgery  . Diastolic dysfunction    hx of chf due to fluid overload 9/12   . Dysrhythmia    atrial fibrillation  . GERD (gastroesophageal reflux disease)   . History of blood product transfusion    d/t bleeding ulcer  . Hypertension   . Macular degeneration (senile) of retina   . Persistent atrial fibrillation ([place])   . PONV (postoperative nausea and vomiting)   . Presence of permanent cardiac pacemaker 2010  . Shortness of breath dyspnea    exertion  . Sick sinus syndrome ([facility])    St. Jude  . Syncope    resolved s/p PPM    Patient Active Problem List   Diagnosis Date Noted  . Intracranial aneurysm 11/28/2016  . Gait disturbance 11/06/2016  . Muscle fatigue 11/06/2016  . Post herpetic neuralgia 11/06/2016  . Left inguinal hernia 10/06/2014  .  Shortness of breath 01/03/2014  . Pacemaker-St.Jude 07/09/2012  . Permanent atrial fibrillation ([place]) 10/20/2010  . HYPERLIPIDEMIA-MIXED 10/26/2009  . Macular degeneration 12/28/2008  . CAD, NATIVE VESSEL 12/28/2008  . GERD 12/28/2008  . SYNCOPE 12/28/2008  . Hypertension 12/28/2008  . Sick sinus syndrome ([place]) 12/28/2008    Past Surgical History:  Procedure Laterality Date  . CHOLECYSTECTOMY  06/12/11  . INGUINAL HERNIA REPAIR Left 10/06/2014   Procedure: OPEN REPAIR LEFT INGUINAL HERNIA ;  Surgeon: Fanny Skates, MD;  Location: [facility];  Service: General;  Laterality: Left;  . INSERTION OF MESH Left 10/06/2014   Procedure: INSERTION OF MESH;  Surgeon: Fanny Skates, MD;  Location: [facility];  Service: General;  Laterality: Left;  . LAPAROSCOPIC CHOLECYSTECTOMY W/ CHOLANGIOGRAPHY  06/12/2011  . PACEMAKER INSERTION  09/28/08   SJM [place] XL DR  . perforated ulcer    . right inguinal hernia repair  11/19/2002   with mesh  . TRANSURETHRAL RESECTION OF BLADDER TUMOR  09/06/2011   Procedure: TRANSURETHRAL RESECTION OF BLADDER TUMOR (TURBT);  Surgeon: Ailene Rud, MD;  Location: [facility];  Service: Urology;  Laterality: N/A;  . TRANSURETHRAL RESECTION OF PROSTATE  11/91/4782  . umblical hernia  9/56/2130       Home Medications    Prior to Admission medications   Medication Sig Start Date End Date Taking? Authorizing Provider  acetaminophen (TYLENOL) 325 MG tablet Take 650 mg by mouth  every 6 (six) hours as needed for mild pain.   Yes [provider]  apixaban (ELIQUIS) 5 MG TABS tablet Take 1 tablet (5 mg total) by mouth 2 (two) times daily. 04/17/17  Yes Allred, Jeneen Rinks, MD  cholecalciferol (VITAMIN D) 1000 units tablet Take 2,000 Units by mouth daily.    Yes [provider]  Cyanocobalamin (VITAMIN B-12 CR PO) Take 1 tablet by mouth daily.    Yes [provider]  famotidine (PEPCID) 20 MG tablet Take 20 mg by mouth daily.    Yes [provider]    triamcinolone cream (KENALOG) 0.1 % Apply 1 application topically daily.  05/23/16  Yes [provider]  carbidopa-levodopa (SINEMET) 25-100 MG tablet Take 1 tablet by mouth 3 (three) times daily. 06/14/17   Varney Biles, MD  TraMADol HCl 100 MG CP24 Take 100 mg by mouth daily. Patient not taking: Reported on 06/14/2017 02/08/17   Britt Bottom, MD    Family History Family History  Problem Relation Age of Onset  . Heart attack Mother 12       MI  . Heart failure Father   . Heart attack Brother        Late 80s  . Cancer Sister     Social History Social History  Substance Use Topics  . Smoking status: Former Smoker    Quit date: 09/17/1958  . Smokeless tobacco: Never Used     Comment: quit 1968  . Alcohol use No     Allergies   Hydroxyzine   Review of Systems Review of Systems  Constitutional: Negative for chills and fever.  HENT: Negative for ear pain and sore throat.   Eyes: Negative for pain and visual disturbance.  Respiratory: Negative for cough and shortness of breath.   Cardiovascular: Negative for chest pain and palpitations.  Gastrointestinal: Negative for abdominal pain and vomiting.  Genitourinary: Negative for dysuria and hematuria.  Musculoskeletal: Positive for gait problem. Negative for arthralgias and back pain.  Skin: Negative for color change and rash.  Neurological: Positive for weakness. Negative for seizures and syncope.  All other systems reviewed and are negative.    Physical Exam Updated Vital Signs BP (!) 147/60   Pulse (!) 56   Temp 98 F (36.7 C)   Resp 16   SpO2 99%   Physical Exam  Constitutional: He appears well-developed.  HENT:  Head: Normocephalic and atraumatic.  Eyes: Conjunctivae are normal.  Neck: Neck supple.  Cardiovascular: Normal rate and regular rhythm.   No murmur heard. Pulmonary/Chest: Effort normal and breath sounds normal. No respiratory distress.  Abdominal: Soft. There is no tenderness.   Musculoskeletal: He exhibits no edema.  Neurological: He is alert. No cranial nerve deficit. Coordination normal.  Mild cogwheel rigidity of BUE. 2/5 BLE strength. Intact distal sensation  Skin: Skin is warm and dry.  Nursing note and vitals reviewed.    ED Treatments / Results  Labs (all labs ordered are listed, but only abnormal results are displayed) Labs Reviewed  PROTIME-INR - Abnormal; Notable for the following:       Result Value   Prothrombin Time 15.3 (*)    All other components within normal limits  COMPREHENSIVE METABOLIC PANEL - Abnormal; Notable for the following:    Glucose, Bld 117 (*)    Calcium 8.7 (*)    ALT 16 (*)    All other components within normal limits  URINALYSIS, ROUTINE W REFLEX MICROSCOPIC - Abnormal; Notable for the following:  Specific Gravity, Urine >1.030 (*)    All other components within normal limits  CBG MONITORING, ED - Abnormal; Notable for the following:    Glucose-Capillary 118 (*)    All other components within normal limits  I-STAT CHEM 8, ED - Abnormal; Notable for the following:    Glucose, Bld 115 (*)    All other components within normal limits  APTT  CBC  DIFFERENTIAL  AMMONIA  I-STAT TROPONIN, ED    EKG  EKG Interpretation  Date/Time:  Friday June 14 2017 08:47:42 EDT Ventricular Rate:  63 PR Interval:    QRS Duration: 158 QT Interval:  482 QTC Calculation: 493 R Axis:   166 Text Interpretation:  Ventricular-paced rhythm Abnormal ECG Abnormal ekg Confirmed by Carmin Muskrat [phone]) on 06/14/2017 10:56:57 AM       Radiology Ct Head Wo Contrast  Result Date: 06/14/2017 CLINICAL DATA:  Balance difficulties EXAM: CT HEAD WITHOUT CONTRAST TECHNIQUE: Contiguous axial images were obtained from the base of the skull through the vertex without intravenous contrast. COMPARISON:  12/07/2016 FINDINGS: Brain: Mild atrophic changes are noted. Chronic white matter ischemic change is noted similar to that seen on the  prior exam. No findings to suggest acute hemorrhage, acute infarction or space-occupying mass lesion are noted. Vascular: No hyperdense vessel or unexpected calcification. Skull: Normal. Negative for fracture or focal lesion. Sinuses/Orbits: No acute finding. Other: None. IMPRESSION: Atrophic changes and chronic white matter ischemic changes without acute abnormality. Electronically Signed   By: Inez Catalina M.D.   On: 06/14/2017 09:24   Ct Cervical Spine Wo Contrast  Result Date: 06/14/2017 CLINICAL DATA:  Back pain with difficulty walking EXAM: CT CERVICAL SPINE WITHOUT CONTRAST TECHNIQUE: Multidetector CT imaging of the cervical spine was performed without intravenous contrast. Multiplanar CT image reconstructions were also generated. COMPARISON:  None. FINDINGS: Alignment: Mildly exaggerated cervical lordosis. No static subluxation. Facet alignment is normal. The occipital condyles and lateral masses of C1 and C2 are aligned. Skull base and vertebrae: No acute fracture. No primary bone lesion or focal pathologic process. Soft tissues and spinal canal: No prevertebral fluid or swelling. No visible canal hematoma. Disc levels: There is bilateral fusion of the C2-C3 facets. No spinal canal or advanced neural foraminal stenosis. Upper chest: Biapical emphysema. Other: None. IMPRESSION: 1. No acute fracture or static subluxation of the cervical spine. 2. No spinal canal stenosis or other focal abnormality that would explain the reported difficulty walking. Electronically Signed   By: Ulyses Jarred M.D.   On: 06/14/2017 15:17   Ct Thoracic Spine Wo Contrast  Result Date: 06/14/2017 CLINICAL DATA:  Back pain, difficulty walking. EXAM: CT THORACIC SPINE WITHOUT CONTRAST TECHNIQUE: Multidetector CT images of the thoracic were obtained using the standard protocol without intravenous contrast. COMPARISON:  Thoracic spine radiographs December 17, 2016 FINDINGS: ALIGNMENT: Maintained thoracic lordosis. No malalignment.  VERTEBRAE: Mild chronic wedging vertebral body T8, less than 25% height loss. Minimal chronic T2, T3 superior endplate compression fractures. Scattered Schmorl's nodes. Osteopenia. No destructive bony lesions. PARASPINAL AND OTHER SOFT TISSUES: Nonacute. 4 cm ascending aorta. Cardiomegaly. Cardiac pacemaker wires. Centrilobular emphysema. Status post cholecystectomy. Large duodenum diverticulum. Surgical clips at GE junction, probable fundoplication. Thickened adrenal glands without discrete nodule. DISC LEVELS: No osseous canal stenosis at any level. Multilevel mild neural foraminal narrowing on the basis of facet arthropathy. IMPRESSION: 1. No acute fracture or malalignment. Old mild compression fractures. Osteopenia. 2. No canal stenosis.  Multilevel mild facet arthropathy. 3. **An incidental finding of potential  clinical significance has been found. 4 cm ascending fusiform aneurysm. Recommend annual imaging followup by CTA or MRA. This recommendation follows 2010 ACCF/AHA/AATS/ACR/ASA/SCA/SCAI/SIR/STS/SVM Guidelines for the Diagnosis and Management of Patients with Thoracic Aortic Disease. Circulation. 2010; 121: M250-I370** Aortic Atherosclerosis (ICD10-I70.0) and Emphysema (ICD10-J43.9). Electronically Signed   By: Elon Alas M.D.   On: 06/14/2017 15:17    Procedures Procedures (including critical care time)  Medications Ordered in ED Medications - No data to display   Initial Impression / Assessment and Plan / ED Course  I have reviewed the triage vital signs and the nursing notes.  Pertinent labs & imaging results that were available during my care of the patient were reviewed by me and considered in my medical decision making (see chart for details).     75 yoM h/o CAD, CHF, s/p pacemaker, macular degeneration, and A. fib on Eliquis who p/w BLE weakness. Slight symmetric weakness of BLE. No other focal neuro deficits. Intact peripheral visual fields. AF, VSS  CT Head showing  atrophic changes and NAICA. Labs unremarkable. UA w/o evidence of UTI.   Neurology consulted. Doubt stroke. Symptoms localizing to spine. Pt unable to obtain MRI due to pacemaker ([place] 2010). CT C and T spine showing no acute fracture or malalignment. Informed pt and wife of ascending aneurysm.  Attempted to ambulate pt. Reportedly able to walk 10 steps with walker, though gait poor. Possible undiagnosed Parkinsonism. Wife feels pt unsafe to return home and is unable to care for him. Social work consulted. Awaiting recommendation or placement option.  Pt care d/w Dr. Vanita Panda  Final Clinical Impressions(s) / ED Diagnoses   Final diagnoses:  Dizziness    New Prescriptions Discharge Medication List as of 06/14/2017 10:37 PM    START taking these medications   Details  carbidopa-levodopa (SINEMET) 25-100 MG tablet Take 1 tablet by mouth 3 (three) times daily., Starting Fri 06/14/2017, Print         Payton Emerald, MD 06/15/17 Danelle Berry    Carmin Muskrat, MD 06/15/17 [phone]

## 2017-06-14 NOTE — Progress Notes (Signed)
CSW was just updated by the EDP and pt and pt's wife now state they are willling to go to [facility].  CSW called [facility] and informed them pt will be arriving and [facility] is expecting the pt.  CSW updated Santiago Glad from [facility].  Please reconsult if future social work needs arise.  CSW signing off, as social work intervention is no longer needed.  Alphonse Guild. Deontrae Drinkard, LCSW, LCAS, CSI Clinical Social Worker Ph: [phone]

## 2017-06-14 NOTE — ED Provider Notes (Signed)
  Physical Exam  BP (!) 147/60   Pulse (!) 56   Temp 98 F (36.7 C)   Resp 16   SpO2 99%   Physical Exam  ED Course  Procedures  MDM  Assuming care of patient from Dr. Vanita Panda   Patient in the ED for fall and poor gait. Workup thus far shows   Concerning findings are as following unable to walk. Important pending results are case management evaluation  According to Dr. Vanita Panda, plan is to dispo per case management.    Patient had no complains, no concerns from the nursing side. Will continue to monitor.    10:37 PM Accepted to [facility], initially declined, but then agreed to go there.   Varney Biles, MD 06/14/17 2238

## 2017-06-14 NOTE — ED Notes (Signed)
PT at bedside, assisting pt to the BR with 2 assist

## 2017-06-14 NOTE — NC FL2 (Deleted)
[place] LEVEL OF CARE SCREENING TOOL     IDENTIFICATION  Patient Name: [name] Birthdate: 1931-02-27 Sex: male Admission Date (Current Location): 06/14/2017  [facility] and [place] Number:  Herbalist and Address:  [facility], [place] [address] [place], [place] [address]      Provider Number: [id number]  Attending Physician Name and Address:  Carmin Muskrat, MD  Relative Name and Phone Number:       Current Level of Care: Hospital Recommended Level of Care: [place] Prior Approval Number:    Date Approved/Denied:   PASRR Number: [id number]  Discharge Plan: SNF    Current Diagnoses: Patient Active Problem List   Diagnosis Date Noted  . Intracranial aneurysm 11/28/2016  . Gait disturbance 11/06/2016  . Muscle fatigue 11/06/2016  . Post herpetic neuralgia 11/06/2016  . Left inguinal hernia 10/06/2014  . Shortness of breath 01/03/2014  . Pacemaker-St.Jude 07/09/2012  . Permanent atrial fibrillation ([place]) 10/20/2010  . HYPERLIPIDEMIA-MIXED 10/26/2009  . Macular degeneration 12/28/2008  . CAD, NATIVE VESSEL 12/28/2008  . GERD 12/28/2008  . SYNCOPE 12/28/2008  . Hypertension 12/28/2008  . Sick sinus syndrome ([place]) 12/28/2008    Orientation RESPIRATION BLADDER Height & Weight     Self, Time, Situation, Place  Normal Continent Weight:   Height:     BEHAVIORAL SYMPTOMS/MOOD NEUROLOGICAL BOWEL NUTRITION STATUS      Continent    AMBULATORY STATUS COMMUNICATION OF NEEDS Skin   Limited Assist Verbally Normal                       Personal Care Assistance Level of Assistance  Bathing, Dressing Bathing Assistance: Limited assistance   Dressing Assistance: Limited assistance     Functional Limitations Info             SPECIAL CARE FACTORS FREQUENCY                       Contractures Contractures Info: Not present    Additional Factors Info  Code Status, Allergies Code  Status Info: Prior Allergies Info: Hydroxyzine           Current Medications (06/14/2017):  This is the current hospital active medication list Current Facility-Administered Medications  Medication Dose Route Frequency Provider Last Rate Last Dose  . apixaban (ELIQUIS) tablet 5 mg  5 mg Oral BID Mu, Frank, MD      . Carbidopa-Levodopa ER (SINEMET CR) 25-100 MG tablet controlled release 1 tablet  1 tablet Oral BID Mu, Frank, MD      . famotidine (PEPCID) tablet 20 mg  20 mg Oral Daily Mu, Frank, MD      . fentaNYL (SUBLIMAZE) injection 25 mcg  25 mcg Intravenous Once Payton Emerald, MD       Current Outpatient Prescriptions  Medication Sig Dispense Refill  . acetaminophen (TYLENOL) 325 MG tablet Take 650 mg by mouth every 6 (six) hours as needed for mild pain.    [name] apixaban (ELIQUIS) 5 MG TABS tablet Take 1 tablet (5 mg total) by mouth 2 (two) times daily. 180 tablet 1  . cholecalciferol (VITAMIN D) 1000 units tablet Take 2,000 Units by mouth daily.     . Cyanocobalamin (VITAMIN B-12 CR PO) Take 1 tablet by mouth daily.     . famotidine (PEPCID) 20 MG tablet Take 20 mg by mouth daily.     [name] triamcinolone cream (KENALOG) 0.1 % Apply  1 application topically daily.     . TraMADol HCl 100 MG CP24 Take 100 mg by mouth daily. (Patient not taking: Reported on 06/14/2017) 30 capsule 5     Discharge Medications: Please see discharge summary for a list of discharge medications.  Relevant Imaging Results:  Relevant Lab Results:   Additional Information [id number]  Alphonse Guild Kim Oki, LCSWA

## 2017-06-14 NOTE — Progress Notes (Addendum)
CSW received a call from Santiago Glad  Pt has been accepted by: [facility] Number for report is: [phone] Pt's unit/room/bed number will be: 127-B Accepting physician: SNF MD  Pt's wife will receive a call from Santiago Glad at [facility] on Monday about pt's wife signing pt's paperwork Pt can arrive ASAP on 06/14/17  PLEASE SEND AVS TO [facility] VIA [facility] WITH THE PT'S PACKET  CSW will update RN and EDP.  Alphonse Guild. Analeya Luallen, LCSW, LCAS, CSI Clinical Social Worker Ph: [phone]

## 2017-06-14 NOTE — Evaluation (Signed)
Physical Therapy Evaluation Patient Details Name: [name] MRN: [id number] DOB: 02-09-1931 Today's Date: 06/14/2017   History of Present Illness  Pt is an 83 y/o male presenting to the ED with balance issues and bilateral LE weakness. Work-up pending. PMH including but not limited to CAD, CHF, a-fib, HTN.  Clinical Impression  Pt presented supine in bed with HOB elevated, awake and willing to participate in therapy session. Pt requesting assistance to bathroom upon arrival. Prior to admission, pt reported that he ambulates with use of RW. Pt lives with his wife and they are seeking ST rehab placement for him prior to him returning home. Pt currently requires heavy two person physical assistance for transfers and to ambulate a short distance (20' x2 bouts). Pt with great difficulty initiating movement and with festinating, shuffling gait pattern. He is at a very high risk for falls and would greatly benefit from further intensive therapy services in the SNF setting prior to returning home. Pt would continue to benefit from skilled physical therapy services at this time while admitted and after d/c to address the below listed limitations in order to improve overall safety and independence with functional mobility.     Follow Up Recommendations SNF    Equipment Recommendations  None recommended by PT    Recommendations for Other Services       Precautions / Restrictions Precautions Precautions: Fall Restrictions Weight Bearing Restrictions: No      Mobility  Bed Mobility Overal bed mobility: Needs Assistance Bed Mobility: Supine to Sit;Sit to Supine     Supine to sit: Max assist Sit to supine: Mod assist   General bed mobility comments: increased time and effort, cueing for sequencing, assist to elevate trunk and to return bilateral LEs onto bed  Transfers Overall transfer level: Needs assistance Equipment used: 2 person hand held assist Transfers: Sit to/from Stand Sit  to Stand: Mod assist;+2 physical assistance         General transfer comment: increased time, assist to power into standing from bed x1 and from toilet x1  Ambulation/Gait Ambulation/Gait assistance: Mod assist;Max assist;+2 physical assistance Ambulation Distance (Feet): 20 Feet (20' x2 with sitting rest break on toilet) Assistive device: 2 person hand held assist Gait Pattern/deviations: Step-to pattern;Step-through pattern;Decreased step length - right;Decreased step length - left;Decreased stride length;Shuffle;Festinating;Trunk flexed;Narrow base of support Gait velocity: decreased Gait velocity interpretation: <1.8 ft/sec, indicative of risk for recurrent falls General Gait Details: pt with significant instability with ambulation requiring two person heavy physical assistance. pt also with great difficulty initiating movement, requiring multimodal cueing throughout  Stairs            Wheelchair Mobility    Modified Rankin (Stroke Patients Only)       Balance Overall balance assessment: Needs assistance Sitting-balance support: Feet supported Sitting balance-Leahy Scale: Poor Sitting balance - Comments: pt required min-mod A to sit EOB   Standing balance support: During functional activity;Bilateral upper extremity supported Standing balance-Leahy Scale: Poor Standing balance comment: mod-max A x2                             Pertinent Vitals/Pain Pain Assessment: No/denies pain    Home Living Family/patient expects to be discharged to:: Skilled nursing facility                 Additional Comments: looking into placement at [facility]    Prior Function Level of Independence: Needs assistance  Gait / Transfers Assistance Needed: pt reported that he ambulates with use of RW           Hand Dominance        Extremity/Trunk Assessment   Upper Extremity Assessment Upper Extremity Assessment: Generalized weakness    Lower Extremity  Assessment Lower Extremity Assessment: Generalized weakness    Cervical / Trunk Assessment Cervical / Trunk Assessment: Kyphotic  Communication   Communication: No difficulties  Cognition Arousal/Alertness: Awake/alert Behavior During Therapy: WFL for tasks assessed/performed Overall Cognitive Status: Impaired/Different from baseline Area of Impairment: Memory;Following commands;Safety/judgement;Problem solving                     Memory: Decreased short-term memory Following Commands: Follows one step commands with increased time;Follows one step commands inconsistently Safety/Judgement: Decreased awareness of safety;Decreased awareness of deficits   Problem Solving: Slow processing;Decreased initiation;Difficulty sequencing;Requires verbal cues;Requires tactile cues General Comments: pt with great difficulty with initiation of movement, requiring multimodal cueing      General Comments      Exercises     Assessment/Plan    PT Assessment Patient needs continued PT services  PT Problem List Decreased strength;Decreased activity tolerance;Decreased balance;Decreased mobility;Decreased coordination;Decreased cognition;Decreased knowledge of use of DME;Decreased safety awareness;Decreased knowledge of precautions       PT Treatment Interventions Gait training;DME instruction;Stair training;Functional mobility training;Therapeutic activities;Therapeutic exercise;Balance training;Neuromuscular re-education;Patient/family education    PT Goals (Current goals can be found in the Care Plan section)  Acute Rehab PT Goals Patient Stated Goal: to go to rehab prior to returning home PT Goal Formulation: With patient/family Time For Goal Achievement: 06/28/17 Potential to Achieve Goals: Fair    Frequency Min 2X/week   Barriers to discharge        Co-evaluation               AM-PAC PT "6 Clicks" Daily Activity  Outcome Measure Difficulty turning over in bed  (including adjusting bedclothes, sheets and blankets)?: Unable Difficulty moving from lying on back to sitting on the side of the bed? : Unable Difficulty sitting down on and standing up from a chair with arms (e.g., wheelchair, bedside commode, etc,.)?: Unable Help needed moving to and from a bed to chair (including a wheelchair)?: A Lot Help needed walking in hospital room?: A Lot Help needed climbing 3-5 steps with a railing? : Total 6 Click Score: 8    End of Session Equipment Utilized During Treatment: Gait belt Activity Tolerance: Patient tolerated treatment well Patient left: in bed;with call bell/phone within reach;with family/visitor present Nurse Communication: Mobility status PT Visit Diagnosis: Unsteadiness on feet (R26.81);Other abnormalities of gait and mobility (R26.89)    Time: 5188-4166 PT Time Calculation (min) (ACUTE ONLY): 17 min   Charges:   PT Evaluation $PT Eval Moderate Complexity: 1 Mod     PT G Codes:   PT G-Codes **NOT FOR INPATIENT CLASS** Functional Assessment Tool Used: AM-PAC 6 Clicks Basic Mobility;Clinical judgement Functional Limitation: Mobility: Walking and moving around Mobility: Walking and Moving Around Current Status (A6301): At least 80 percent but less than 100 percent impaired, limited or restricted Mobility: Walking and Moving Around Goal Status [phone]): At least 40 percent but less than 60 percent impaired, limited or restricted    [facility], [place], DPT [place] 06/14/2017, 6:06 PM

## 2017-06-16 NOTE — Clinical Social Work Note (Signed)
Clinical Social Work Assessment  Patient Details  Name: [name] MRN: [id number] Date of Birth: June 14, 1931  Date of referral:  06/16/17               Reason for consult:  Facility Placement                Permission sought to share information with:  Chartered certified accountant granted to share information::  Yes, Verbal Permission Granted  Name::        Agency::     Relationship::     Contact Information:     Housing/Transportation Living arrangements for the past 2 months:  Single Family Home Source of Information:  Patient, Spouse Patient Interpreter Needed:  None Criminal Activity/Legal Involvement Pertinent to Current Situation/Hospitalization:    Significant Relationships:  Spouse Lives with:  Spouse Do you feel safe going back to the place where you live?  No Need for family participation in patient care:  Yes (Comment)  Care giving concerns:  Pt's wife states pt will be unsafe to return home.     Social Worker assessment / plan:  CSW spoke to [place] Asst Director who stated if [facility] would accept the pt then the pt can D/C to [place]. Pt stated they would go to [facility] initially but after the referral was accepted and pt was preparing for transport and D/C orders signed family changed their mind and decided to return home via [facility] and not to go to [facility]. CSW notified Santiago Glad at [facility] and updated EDP who placed a [facility] consult with PT/OT and [facility] for the pt to be contacted by [facility] tomorrow (Sat 9/29).  Pt at 11:30pm changed their mind again and D/C'd to [facility] SNF  Please reconsult if future social work needs arise.  CSW signing off, as social work intervention is no longer needed.   Employment status:  Retired Forensic scientist:  Medicare PT Recommendations:  [place] / Referral to community resources:     Patient/Family's Response to care:  Patient alert and  oriented.  Patient and pt's wife agreeable to plan.  Pt's wife supportive and strongly involved in pt.'s care.  Pt.'s wife pleasant and appreciated CSW intervention.    Patient/Family's Understanding of and Emotional Response to Diagnosis, Current Treatment, and Prognosis:  Still assessing   Emotional Assessment Appearance:  Appears stated age Attitude/Demeanor/Rapport:  Unable to Assess Affect (typically observed):  Unable to Assess Orientation:  Oriented to Self, Oriented to Place, Oriented to  Time, Oriented to Situation Alcohol / Substance use:    Psych involvement (Current and /or in the community):     Discharge Needs  Concerns to be addressed:  Home Safety Concerns Readmission within the last 30 days:  No Current discharge risk:  None Barriers to Discharge:  No Barriers Identified   [place], LCSWA 06/16/2017, 1:40 PM

## 2017-07-29 ENCOUNTER — Ambulatory Visit: Payer: Medicare Other | Admitting: Podiatry

## 2017-08-01 ENCOUNTER — Other Ambulatory Visit: Payer: Self-pay

## 2017-08-01 ENCOUNTER — Emergency Department (HOSPITAL_COMMUNITY)
Admission: EM | Admit: 2017-08-01 | Discharge: 2017-08-01 | Disposition: A | Discharge status: Home / Self Care | Payer: Medicare Other | Attending: Emergency Medicine | Admitting: Emergency Medicine

## 2017-08-01 ENCOUNTER — Emergency Department (HOSPITAL_COMMUNITY): Payer: Medicare Other

## 2017-08-01 DIAGNOSIS — Z950 Presence of cardiac pacemaker: Secondary | ICD-10-CM | POA: Diagnosis not present

## 2017-08-01 DIAGNOSIS — S3992XA Unspecified injury of lower back, initial encounter: Secondary | ICD-10-CM | POA: Diagnosis present

## 2017-08-01 DIAGNOSIS — X58XXXA Exposure to other specified factors, initial encounter: Secondary | ICD-10-CM | POA: Insufficient documentation

## 2017-08-01 DIAGNOSIS — R1030 Lower abdominal pain, unspecified: Secondary | ICD-10-CM | POA: Diagnosis not present

## 2017-08-01 DIAGNOSIS — I509 Heart failure, unspecified: Secondary | ICD-10-CM | POA: Diagnosis not present

## 2017-08-01 DIAGNOSIS — I110 Hypertensive heart disease with heart failure: Secondary | ICD-10-CM | POA: Diagnosis not present

## 2017-08-01 DIAGNOSIS — Y999 Unspecified external cause status: Secondary | ICD-10-CM | POA: Diagnosis not present

## 2017-08-01 DIAGNOSIS — S32010A Wedge compression fracture of first lumbar vertebra, initial encounter for closed fracture: Secondary | ICD-10-CM | POA: Insufficient documentation

## 2017-08-01 DIAGNOSIS — Z7901 Long term (current) use of anticoagulants: Secondary | ICD-10-CM | POA: Insufficient documentation

## 2017-08-01 DIAGNOSIS — Y9389 Activity, other specified: Secondary | ICD-10-CM | POA: Insufficient documentation

## 2017-08-01 DIAGNOSIS — I2510 Atherosclerotic heart disease of native coronary artery without angina pectoris: Secondary | ICD-10-CM | POA: Insufficient documentation

## 2017-08-01 DIAGNOSIS — Y929 Unspecified place or not applicable: Secondary | ICD-10-CM | POA: Insufficient documentation

## 2017-08-01 DIAGNOSIS — Z87891 Personal history of nicotine dependence: Secondary | ICD-10-CM | POA: Diagnosis not present

## 2017-08-01 DIAGNOSIS — Z79899 Other long term (current) drug therapy: Secondary | ICD-10-CM | POA: Insufficient documentation

## 2017-08-01 LAB — COMPREHENSIVE METABOLIC PANEL
ALBUMIN: 3.6 g/dL (ref 3.5–5.0)
ALT: 7 U/L — ABNORMAL LOW (ref 17–63)
ANION GAP: 8 (ref 5–15)
AST: 21 U/L (ref 15–41)
Alkaline Phosphatase: 101 U/L (ref 38–126)
BUN: 17 mg/dL (ref 6–20)
CALCIUM: 9 mg/dL (ref 8.9–10.3)
CHLORIDE: 104 mmol/L (ref 101–111)
CO2: 26 mmol/L (ref 22–32)
CREATININE: 0.92 mg/dL (ref 0.61–1.24)
GFR calc Af Amer: >60 mL/min (ref >60)
GFR calc non Af Amer: >60 mL/min (ref >60)
GLUCOSE: 119 mg/dL — AB (ref 65–99)
POTASSIUM: 4.6 mmol/L (ref 3.5–5.1)
Sodium: 138 mmol/L (ref 135–145)
Total Bilirubin: 0.8 mg/dL (ref 0.3–1.2)
Total Protein: 7.4 g/dL (ref 6.5–8.1)

## 2017-08-01 LAB — URINALYSIS, ROUTINE W REFLEX MICROSCOPIC
BILIRUBIN URINE: NEGATIVE
Glucose, UA: NEGATIVE mg/dL
HGB URINE DIPSTICK: NEGATIVE
Ketones, ur: 5 mg/dL — AB
Leukocytes, UA: NEGATIVE
Nitrite: NEGATIVE
PROTEIN: NEGATIVE mg/dL
Specific Gravity, Urine: 1.036 — ABNORMAL HIGH (ref 1.005–1.030)
pH: 7 (ref 5.0–8.0)

## 2017-08-01 LAB — CBC
HCT: 45.4 % (ref 39.0–52.0)
Hemoglobin: 15.3 g/dL (ref 13.0–17.0)
MCH: 31 pg (ref 26.0–34.0)
MCHC: 33.7 g/dL (ref 30.0–36.0)
MCV: 91.9 fL (ref 78.0–100.0)
PLATELETS: 224 10*3/uL (ref 150–400)
RBC: 4.94 MIL/uL (ref 4.22–5.81)
RDW: 14 % (ref 11.5–15.5)
WBC: 10.3 10*3/uL (ref 4.0–10.5)

## 2017-08-01 LAB — LIPASE, BLOOD: LIPASE: 18 U/L (ref 11–51)

## 2017-08-01 MED ORDER — FENTANYL CITRATE (PF) 100 MCG/2ML IJ SOLN
50.0000 ug | Freq: Once | INTRAMUSCULAR | Status: AC
  Administered 2017-08-01: 50 ug via INTRAVENOUS
  Filled 2017-08-01: qty 2

## 2017-08-01 MED ORDER — IOPAMIDOL (ISOVUE-300) INJECTION 61%
INTRAVENOUS | Status: AC
  Filled 2017-08-01: qty 100

## 2017-08-01 MED ORDER — TRAMADOL HCL 50 MG PO TABS: 25.0000 mg | ORAL_TABLET | Freq: Four times a day (QID) | ORAL | 0 refills | Status: AC | PRN

## 2017-08-01 MED ORDER — IOPAMIDOL (ISOVUE-300) INJECTION 61%
100.0000 mL | Freq: Once | INTRAVENOUS | Status: AC | PRN
  Administered 2017-08-01: 100 mL via INTRAVENOUS

## 2017-08-01 MED ORDER — TRAMADOL HCL 50 MG PO TABS
25.0000 mg | ORAL_TABLET | Freq: Once | ORAL | Status: AC
  Administered 2017-08-01: 25 mg via ORAL
  Filled 2017-08-01: qty 1

## 2017-08-01 MED ORDER — ACETAMINOPHEN 500 MG PO TABS
1000.0000 mg | ORAL_TABLET | Freq: Once | ORAL | Status: AC
Start: 2017-08-01 — End: 2017-08-01
  Administered 2017-08-01: 1000 mg via ORAL
  Filled 2017-08-01: qty 2

## 2017-08-01 MED ORDER — ACETAMINOPHEN 500 MG PO TABS: 1000.0000 mg | ORAL_TABLET | Freq: Three times a day (TID) | ORAL | 0 refills | Status: AC

## 2017-08-01 NOTE — ED Provider Notes (Signed)
  Physical Exam  BP 126/83   Pulse 63   Resp 16   SpO2 (!) 86% Comment: Refuses oxygen. Pt is lying supine due to abdominal discomfort.    Physical Exam  ED Course  Procedures  MDM  Assuming care of patient from Dr. Leonette Monarch   Patient in the ED for abd pain and flank pain x several days, getting worse. Pt has hx of abd hernia. Workup thus far shows L1 compression fracture on CT abdomen  Concerning findings are as following none Important pending results are none. PT is awaiting [facility] placement  According to Dr. Leonette Monarch, plan is to d/c when comfortable.  Patient had no complains, no concerns from the nursing side. Will continue to monitor.        Varney Biles, MD 08/01/17 [phone]

## 2017-08-01 NOTE — ED Provider Notes (Signed)
[facility] DEPT Provider Note  CSN: [id number] Arrival date & time: 08/01/17 1217  Chief Complaint(s) Abdominal Pain  HPI [name] is a 83 y.o. male   The history is provided by the patient.  Abdominal Pain   This is a new problem. Episode onset: 1 week. Episode frequency: intermittent. The problem has not changed since onset.The pain is associated with an unknown factor. The pain is located in the suprapubic region and RLQ. The quality of the pain is aching, sharp, shooting and cramping. The pain is moderate. Associated symptoms include constipation. Pertinent negatives include fever, diarrhea, hematochezia, melena, nausea, vomiting, dysuria and frequency. Associated symptoms comments: Difficulty voiding. The symptoms are aggravated by certain positions. Nothing relieves the symptoms.    Past Medical History Past Medical History:  Diagnosis Date  . CAD (coronary artery disease) 09/2008  . Cancer ([place])    basal cell carcinoma  . CHF (congestive heart failure) ([place])   . Complication of anesthesia    06/12/11 anesthesia made legs asleep also and went into chf and ended up in ICU after gallbladder surgery  . Diastolic dysfunction    hx of chf due to fluid overload 9/12   . Dysrhythmia    atrial fibrillation  . GERD (gastroesophageal reflux disease)   . History of blood product transfusion    d/t bleeding ulcer  . Hypertension   . Macular degeneration (senile) of retina   . Persistent atrial fibrillation ([place])   . PONV (postoperative nausea and vomiting)   . Presence of permanent cardiac pacemaker 2010  . Shortness of breath dyspnea    exertion  . Sick sinus syndrome ([facility])    St. Jude  . Syncope    resolved s/p PPM   Patient Active Problem List   Diagnosis Date Noted  . Intracranial aneurysm 11/28/2016  . Gait disturbance 11/06/2016  . Muscle fatigue 11/06/2016  . Post herpetic neuralgia 11/06/2016  . Left inguinal hernia 10/06/2014    . Shortness of breath 01/03/2014  . Pacemaker-St.Jude 07/09/2012  . Permanent atrial fibrillation ([place]) 10/20/2010  . HYPERLIPIDEMIA-MIXED 10/26/2009  . Macular degeneration 12/28/2008  . CAD, NATIVE VESSEL 12/28/2008  . GERD 12/28/2008  . SYNCOPE 12/28/2008  . Hypertension 12/28/2008  . Sick sinus syndrome ([place]) 12/28/2008   Home Medication(s) Prior to Admission medications   Medication Sig Start Date End Date Taking? Authorizing Provider  apixaban (ELIQUIS) 5 MG TABS tablet Take 1 tablet (5 mg total) by mouth 2 (two) times daily. 04/17/17  Yes Allred, Jeneen Rinks, MD  carbidopa-levodopa (SINEMET) 25-100 MG tablet Take 1 tablet by mouth 3 (three) times daily. 06/14/17  Yes Varney Biles, MD  cholecalciferol (VITAMIN D) 1000 units tablet Take 2,000 Units by mouth daily.    Yes [provider]  Cyanocobalamin (VITAMIN B-12 CR PO) Take 1 tablet by mouth daily.    Yes [provider]  famotidine (PEPCID) 20 MG tablet Take 20 mg by mouth daily.    Yes [provider]  Magnesium 100 MG CAPS Take 200 mg 2 (two) times daily by mouth.   Yes [provider]  meclizine (ANTIVERT) 25 MG tablet Take 25 mg every 6 (six) hours as needed by mouth for dizziness. 07/23/17  Yes [provider]  triamcinolone cream (KENALOG) 0.1 % Apply 1 application topically daily.  05/23/16  Yes [provider]  acetaminophen (TYLENOL) 500 MG tablet Take 2 tablets (1,000 mg total) every 8 (eight) hours for 5 days by mouth. Do not  take more than 4000 mg of acetaminophen (Tylenol) in a 24-hour period. Please note that other medicines that you may be prescribed may have Tylenol as well. 08/01/17 08/06/17  Fatima Blank, MD  traMADol (ULTRAM) 50 MG tablet Take 0.5-1 tablets (25-50 mg total) every 6 (six) hours as needed for up to 15 days by mouth. 08/01/17 08/16/17  Elizebeth Kluesner, Grayce Sessions, MD                                                                                                                                     Past Surgical History Past Surgical History:  Procedure Laterality Date  . CHOLECYSTECTOMY  06/12/11  . INGUINAL HERNIA REPAIR Left 10/06/2014   Procedure: OPEN REPAIR LEFT INGUINAL HERNIA ;  Surgeon: Fanny Skates, MD;  Location: [facility];  Service: General;  Laterality: Left;  . INSERTION OF MESH Left 10/06/2014   Procedure: INSERTION OF MESH;  Surgeon: Fanny Skates, MD;  Location: [facility];  Service: General;  Laterality: Left;  . LAPAROSCOPIC CHOLECYSTECTOMY W/ CHOLANGIOGRAPHY  06/12/2011  . PACEMAKER INSERTION  09/28/08   SJM [place] XL DR  . perforated ulcer    . right inguinal hernia repair  11/19/2002   with mesh  . TRANSURETHRAL RESECTION OF BLADDER TUMOR  09/06/2011   Procedure: TRANSURETHRAL RESECTION OF BLADDER TUMOR (TURBT);  Surgeon: Ailene Rud, MD;  Location: [facility];  Service: Urology;  Laterality: N/A;  . TRANSURETHRAL RESECTION OF PROSTATE  73/53/2992  . umblical hernia  01/11/8340   Family History Family History  Problem Relation Age of Onset  . Heart attack Mother 76       MI  . Heart failure Father   . Heart attack Brother        Late 34s  . Cancer Sister     Social History Social History   Tobacco Use  . Smoking status: Former Smoker    Last attempt to quit: 09/17/1958    Years since quitting: 58.9  . Smokeless tobacco: Never Used  . Tobacco comment: quit 1968  Substance Use Topics  . Alcohol use: No  . Drug use: No   Allergies Hydrocodone and Hydroxyzine  Review of Systems Review of Systems  Constitutional: Negative for fever.  Gastrointestinal: Positive for abdominal pain and constipation. Negative for diarrhea, hematochezia, melena, nausea and vomiting.  Genitourinary: Negative for dysuria and frequency.   All other systems are reviewed and are negative for acute change except as noted in the HPI  Physical Exam Vital Signs  I have reviewed the triage vital signs BP 126/83   Pulse 63   Resp  16   SpO2 (!) 86% Comment: Refuses oxygen. Pt is lying supine due to abdominal discomfort.    Physical Exam  Constitutional: He is oriented to person, place, and time. He appears well-developed and well-nourished. No distress.  HENT:  Head: Normocephalic and atraumatic.  Nose: Nose normal.  Eyes: Conjunctivae and EOM are normal. Pupils are equal, round, and reactive to light. Right eye exhibits no discharge. Left eye exhibits no discharge. No scleral icterus.  Neck: Normal range of motion. Neck supple.  Cardiovascular: Normal rate and regular rhythm. Exam reveals no gallop and no friction rub.  No murmur heard. Pulmonary/Chest: Effort normal and breath sounds normal. No stridor. No respiratory distress. He has no rales.  Abdominal: Soft. He exhibits no distension. There is tenderness (discomfort) in the right lower quadrant and suprapubic area. There is guarding. There is no rigidity and no rebound. A hernia is present. Hernia confirmed positive in the ventral area (non tender; unable to reduce).  Musculoskeletal: He exhibits no edema.       Lumbar back: He exhibits tenderness and bony tenderness.       Back:  Neurological: He is alert and oriented to person, place, and time.  Spine Exam: Strength: 5/5 throughout LE bilaterally (hip flexion/extension, adduction/abduction; knee flexion/extension; foot dorsiflexion/plantarflexion, inversion/eversion; great toe inversion) Sensation: Intact to light touch in proximal and distal LE bilaterally Reflexes: 1+ quadriceps and achilles reflexes    Skin: Skin is warm and dry. No rash noted. He is not diaphoretic. No erythema.  Psychiatric: He has a normal mood and affect.  Vitals reviewed.   ED Results and Treatments Labs (all labs ordered are listed, but only abnormal results are displayed) Labs Reviewed  COMPREHENSIVE METABOLIC PANEL - Abnormal; Notable for the following components:      Result Value   Glucose, Bld 119 (*)    ALT 7 (*)     All other components within normal limits  URINALYSIS, ROUTINE W REFLEX MICROSCOPIC - Abnormal; Notable for the following components:   APPearance CLOUDY (*)    Specific Gravity, Urine 1.036 (*)    Ketones, ur 5 (*)    All other components within normal limits  LIPASE, BLOOD  CBC                                                                                                                         EKG  EKG Interpretation  Date/Time:    Ventricular Rate:    PR Interval:    QRS Duration:   QT Interval:    QTC Calculation:   R Axis:     Text Interpretation:        Radiology Ct Abdomen Pelvis W Contrast  Result Date: 08/01/2017 CLINICAL DATA:  83 year old male with abdominal pain radiating to the flank/back for 2-3 weeks with recent progression. Visible bulging of the lower abdomen. EXAM: CT ABDOMEN AND PELVIS WITH CONTRAST TECHNIQUE: Multidetector CT imaging of the abdomen and pelvis was performed using the standard protocol following bolus administration of intravenous contrast. CONTRAST:  146mL ISOVUE-300 IOPAMIDOL (ISOVUE-300) INJECTION 61% COMPARISON:  CT Abdomen and Pelvis 07/30/2014 FINDINGS: Lower chest: Lower lung volumes with crowding of markings. Chronic emphysema and reticular honeycombing at the lung bases worse on the right. No pleural effusion or consolidation. Cardiomegaly  appears increased since 2015. No pericardial effusion. Partially visible pacemaker leads. Hepatobiliary: Surgically absent gallbladder.  Negative liver. Pancreas: Mild progression of pancreatic atrophy. Spleen: Negative. Adrenals/Urinary Tract: Stable mild adrenal gland thickening. Bilateral renal enhancement and contrast excretion is symmetric and normal. Occasional small benign renal cysts. Normal ureters. Distended but otherwise unremarkable urinary bladder. Stomach/Bowel: Retained stool in the rectum. Severe diverticulosis throughout the sigmoid colon, but no definite active inflammation. Severe  diverticulosis continues in the left colon to the mid transverse with no active inflammation identified. Mild to moderate diverticulosis at the hepatic flexure. Similar volume of retained stool in the right colon to the 2015 study. Normal retrocecal appendix. Negative terminal ileum. No dilated small bowel. Postoperative changes surrounding the gastroesophageal junction and stomach are stable. Chronic proximal duodenum diverticulum measuring up to 5.5 cm. No surrounding inflammation. Negative duodenum otherwise. No abdominal free fluid. Progressed since 2015 midline supraumbilical mesenteric fat containing ventral abdominal hernia, now the hernia sac is 5.3 cm diameter with an 18 mm neck (previously 2.3 cm diameter with a 10 mm neck. No associated inflammation. Rectus muscle diastases but no other ventral abdominal hernia. Vascular/Lymphatic: Stable ectasia of the visible aorta. Aortoiliac calcified atherosclerosis. Major arterial structures are patent. Portal venous system appears patent. Stable small abdominal lymph nodes.  No lymphadenopathy. Reproductive: Surgically absent. Other: No pelvic free fluid. Musculoskeletal: Osteopenia. Mild L1 superior endplate compression is new since 2015 and mildly sclerotic although a lucent superior endplate defect is visible. No retropulsion. Other visible osseous structures appear stable. IMPRESSION: 1. Age indeterminate mild compression fracture of the L1 superior endplate, but suspicious for a subacute fracture and the symptomatic abnormality given absence of other findings on this study. If specific therapy such as vertebroplasty is desired Nuclear Medicine Whole-body Bone Scan would best determine acuity (assuming non MRI compatible pacemaker). 2. No other acute or inflammatory findings. 3. Severe diverticulosis of the colon from the transverse to the sigmoid, but no active inflammation. 4. Aortic Atherosclerosis (ICD10-I70.0) and Emphysema (ICD10-J43.9). Areas of  pulmonary fibrosis at the lung bases. Cardiomegaly. 5. Progressed fat containing supraumbilical midline ventral abdominal hernia since 2015, now up to 5.3 cm diameter. Electronically Signed   By: Genevie Ann M.D.   On: 08/01/2017 15:05   Pertinent labs & imaging results that were available during my care of the patient were reviewed by me and considered in my medical decision making (see chart for details).  Medications Ordered in ED Medications  iopamidol (ISOVUE-300) 61 % injection (not administered)  fentaNYL (SUBLIMAZE) injection 50 mcg (50 mcg Intravenous Given 08/01/17 1335)  iopamidol (ISOVUE-300) 61 % injection 100 mL (100 mLs Intravenous Contrast Given 08/01/17 1430)  traMADol (ULTRAM) tablet 25 mg (25 mg Oral Given 08/01/17 1547)  acetaminophen (TYLENOL) tablet 1,000 mg (1,000 mg Oral Given 08/01/17 1547)  Procedures Procedures  (including critical care time)  Medical Decision Making / ED Course I have reviewed the nursing notes for this encounter and the patient's prior records (if available in EHR or on provided paperwork).    Patient with vague abdominal discomfort no real tenderness to palpation.  Most of the patient's pain is related to his back.  I have suspicion for compression fracture with radiculopathy around to the abdomen however patient has abdominal hernia and reports 2 days of no bowel movements.  Will obtain a CT scan to assess for possible obstruction, hernia incarceration or other serious intra-abdominal inflammatory/infectious process.  Also obtain screening labs and urinalysis.  Labs grossly reassuring.  UA without infection.  CT scan without evidence of obstruction or other serious intra-abdominal inflammatory/infectious process.  However did reveal L1 compression fracture.  Upon further questioning  Patient and wife denied any recent  falls.  Wife did report that the patient has been complaining of lower back pain for several weeks.  She tried to use a massager last night which seemed to worsen his pain.  It was at that time that the patient started complaining of the abdominal pain.  Corset was ordered for the patient.  He was provided with IV and oral pain medicine.  Once the corset that is placed, the patient will be ambulated.  If he is able to tolerate the pain and ambulate without serious complication, he should be appropriate for discharge.  However if he has significant pain causing him to have difficulty ambulating, patient may require admission.  Patient care turned over to Dr Kathrynn Humble at [phone]. Patient case and results discussed in detail; please see their note for further ED managment.      Final Clinical Impression(s) / ED Diagnoses Final diagnoses:  Closed compression fracture of first lumbar vertebra, initial encounter [facility])      This chart was dictated using voice recognition software.  Despite best efforts to proofread,  errors can occur which can change the documentation meaning.   Fatima Blank, MD 08/01/17 [phone]

## 2017-08-01 NOTE — ED Notes (Signed)
Failed attempt to collect urine. Pt is aware of UA

## 2017-08-01 NOTE — ED Notes (Signed)
Bed: MZ58 Expected date:  Expected time:  Means of arrival:  Comments: 83 yo abdominal pain

## 2017-08-01 NOTE — ED Triage Notes (Signed)
Per EMS, pt presents from home with umbilical abd pain radiating to flank times 2-3 weeks with worsening over the last 5 days. Patient has visible bulging of lower abdomen that has gotten larger in the last few days. Pain is 10/10. Patient has hx of hernia.

## 2017-08-04 ENCOUNTER — Encounter (HOSPITAL_COMMUNITY): Payer: Self-pay | Admitting: Emergency Medicine

## 2017-08-04 ENCOUNTER — Emergency Department (HOSPITAL_COMMUNITY)
Admission: EM | Admit: 2017-08-04 | Discharge: 2017-08-04 | Disposition: A | Discharge status: Nursing Home (Includes ICF) | Payer: Medicare Other | Attending: Emergency Medicine | Admitting: Emergency Medicine

## 2017-08-04 ENCOUNTER — Emergency Department (HOSPITAL_COMMUNITY): Payer: Medicare Other

## 2017-08-04 DIAGNOSIS — I4891 Unspecified atrial fibrillation: Secondary | ICD-10-CM | POA: Insufficient documentation

## 2017-08-04 DIAGNOSIS — Z95811 Presence of heart assist device: Secondary | ICD-10-CM | POA: Diagnosis not present

## 2017-08-04 DIAGNOSIS — R262 Difficulty in walking, not elsewhere classified: Secondary | ICD-10-CM | POA: Insufficient documentation

## 2017-08-04 DIAGNOSIS — Z87891 Personal history of nicotine dependence: Secondary | ICD-10-CM | POA: Diagnosis not present

## 2017-08-04 DIAGNOSIS — I2510 Atherosclerotic heart disease of native coronary artery without angina pectoris: Secondary | ICD-10-CM | POA: Insufficient documentation

## 2017-08-04 DIAGNOSIS — Z79899 Other long term (current) drug therapy: Secondary | ICD-10-CM | POA: Insufficient documentation

## 2017-08-04 DIAGNOSIS — R29898 Other symptoms and signs involving the musculoskeletal system: Secondary | ICD-10-CM | POA: Insufficient documentation

## 2017-08-04 DIAGNOSIS — I509 Heart failure, unspecified: Secondary | ICD-10-CM | POA: Insufficient documentation

## 2017-08-04 DIAGNOSIS — I10 Essential (primary) hypertension: Secondary | ICD-10-CM | POA: Diagnosis not present

## 2017-08-04 DIAGNOSIS — M545 Low back pain: Secondary | ICD-10-CM | POA: Diagnosis present

## 2017-08-04 DIAGNOSIS — M5441 Lumbago with sciatica, right side: Secondary | ICD-10-CM | POA: Insufficient documentation

## 2017-08-04 MED ORDER — TRAMADOL HCL 50 MG PO TABS
50.0000 mg | ORAL_TABLET | Freq: Once | ORAL | Status: AC
  Administered 2017-08-04: 50 mg via ORAL
  Filled 2017-08-04: qty 1

## 2017-08-04 NOTE — ED Notes (Signed)
Bed: WA06 Expected date: 08/04/17 Expected time: 12:59 PM Means of arrival: Ambulance Comments: Back Pain chronic

## 2017-08-04 NOTE — ED Notes (Signed)
Family at bedside. 

## 2017-08-04 NOTE — ED Triage Notes (Addendum)
Per EMS, patient from home, c/o back pain after dx with back fracture x3 days ago. Ambulatory with walker. A&Ox4. Wife reports patient has been taking pain medication as prescribed.  Additionally, patient rates pain 2/10. Reports he "wanted a second opinion."   BP 122/78 HR 65

## 2017-08-04 NOTE — ED Notes (Signed)
Pt brought to [facility] via [facility] unable to receive signature due to pt condition

## 2017-08-04 NOTE — Discharge Instructions (Signed)
Your back pain is likely due to the lumbar fracture that they discovered several days ago.  Your imaging today showed no new changes or abnormalities.  Suspect the muscle spasms and pain is causing your weakness.  You were unable to get MRI due to your pacemaker.  Due to your difficulty with ambulation, we did not feel you are safe to go home thus, you will be admitted to [facility] care for further management.  Please follow-up with your primary care physician and if any symptoms change or worsen, please return to this emergency department.  Please continue to use your back brace and your pain medications.

## 2017-08-04 NOTE — NC FL2 (Signed)
[place] LEVEL OF CARE SCREENING TOOL     IDENTIFICATION  Patient Name: [name] Birthdate: Jan 24, 1931 Sex: male Admission Date (Current Location): 08/04/2017  [facility] and [place] Number:  Herbalist and Address:  [facility] [place], [place] [address] [place], [place] [address]      Provider Number: [id number]  Attending Physician Name and Address:  Tegeler, Gwenyth Allegra, *  Relative Name and Phone Number:       Current Level of Care: Hospital Recommended Level of Care: [place] Prior Approval Number:    Date Approved/Denied:   PASRR Number: [id number]  Discharge Plan: SNF    Current Diagnoses: Patient Active Problem List   Diagnosis Date Noted  . Intracranial aneurysm 11/28/2016  . Gait disturbance 11/06/2016  . Muscle fatigue 11/06/2016  . Post herpetic neuralgia 11/06/2016  . Left inguinal hernia 10/06/2014  . Shortness of breath 01/03/2014  . Pacemaker-St.Jude 07/09/2012  . Permanent atrial fibrillation ([place]) 10/20/2010  . HYPERLIPIDEMIA-MIXED 10/26/2009  . Macular degeneration 12/28/2008  . CAD, NATIVE VESSEL 12/28/2008  . GERD 12/28/2008  . SYNCOPE 12/28/2008  . Hypertension 12/28/2008  . Sick sinus syndrome ([place]) 12/28/2008    Orientation RESPIRATION BLADDER Height & Weight     Self, Time, Situation, Place  Normal Continent Weight:   Height:     BEHAVIORAL SYMPTOMS/MOOD NEUROLOGICAL BOWEL NUTRITION STATUS      Continent    AMBULATORY STATUS COMMUNICATION OF NEEDS Skin   Limited Assist Verbally Normal                       Personal Care Assistance Level of Assistance  Bathing, Feeding, Dressing Bathing Assistance: Limited assistance Feeding assistance: Independent Dressing Assistance: Limited assistance     Functional Limitations Info             SPECIAL CARE FACTORS FREQUENCY                       Contractures Contractures Info: Not present    Additional  Factors Info  Code Status Code Status Info: Full Code Allergies Info: Hydroxyzine           Current Medications (08/04/2017):  This is the current hospital active medication list No current facility-administered medications for this encounter.    Current Outpatient Medications  Medication Sig Dispense Refill  . acetaminophen (TYLENOL) 500 MG tablet Take 2 tablets (1,000 mg total) every 8 (eight) hours for 5 days by mouth. Do not take more than 4000 mg of acetaminophen (Tylenol) in a 24-hour period. Please note that other medicines that you may be prescribed may have Tylenol as well. 30 tablet 0  . apixaban (ELIQUIS) 5 MG TABS tablet Take 1 tablet (5 mg total) by mouth 2 (two) times daily. 180 tablet 1  . carbidopa-levodopa (SINEMET) 25-100 MG tablet Take 1 tablet by mouth 3 (three) times daily. 30 tablet 0  . cholecalciferol (VITAMIN D) 1000 units tablet Take 2,000 Units by mouth daily.     . Cyanocobalamin (VITAMIN B-12 CR PO) Take 1 tablet by mouth daily.     . famotidine (PEPCID) 20 MG tablet Take 20 mg by mouth daily.     . Magnesium 100 MG CAPS Take 200 mg 2 (two) times daily by mouth.    . meclizine (ANTIVERT) 25 MG tablet Take 25 mg every 6 (six) hours as needed by mouth for dizziness.  0  . traMADol (  ULTRAM) 50 MG tablet Take 0.5-1 tablets (25-50 mg total) every 6 (six) hours as needed for up to 15 days by mouth. 30 tablet 0  . triamcinolone cream (KENALOG) 0.1 % Apply 1 application topically daily.        Discharge Medications: Please see discharge summary for a list of discharge medications.  Relevant Imaging Results:  Relevant Lab Results:   Additional Information SSN [id number]   Barbette Or, [place]

## 2017-08-04 NOTE — Clinical Social Work Note (Signed)
Clinical Social Worker received referral from [place] regarding potential placement needs for patient at discharge.  CSW currently at [facility], however will assist by phone.  Patient has been to [facility] in the past.  Facility is willing to have patient return directly from the ED if agreeable.  EDP plans to communicate with patient about available bed offer and will notify CSW of patient decision.  CSW remains available for support and to facilitate patient discharge needs.  Barbette Or, [place]

## 2017-08-04 NOTE — ED Provider Notes (Signed)
[facility] DEPT Provider Note   CSN: [id number] Arrival date & time: 08/04/17  1259     History   Chief Complaint Chief Complaint  Patient presents with  . Back Pain    HPI [name] is a 83 y.o. male.  The history is provided by medical records and the patient. No language interpreter was used.  Back Pain   This is a recurrent problem. The current episode started more than 2 days ago. The problem occurs constantly. The problem has not changed since onset.The pain is present in the lumbar spine. The quality of the pain is described as aching. The pain does not radiate. The pain is at a severity of 6/10. The pain is moderate. The symptoms are aggravated by bending. The pain is the same all the time. Associated symptoms include leg pain and weakness. Pertinent negatives include no chest pain, no fever, no numbness, no weight loss, no headaches, no abdominal pain, no abdominal swelling, no bowel incontinence, no perianal numbness, no bladder incontinence, no dysuria, no pelvic pain, no paresthesias and no paresis. Treatments tried: tramadol and TLSO bracing. The treatment provided mild relief. Risk factors: known L1 fractrure.    Past Medical History:  Diagnosis Date  . CAD (coronary artery disease) 09/2008  . Cancer ([place])    basal cell carcinoma  . CHF (congestive heart failure) ([place])   . Complication of anesthesia    06/12/11 anesthesia made legs asleep also and went into chf and ended up in ICU after gallbladder surgery  . Diastolic dysfunction    hx of chf due to fluid overload 9/12   . Dysrhythmia    atrial fibrillation  . GERD (gastroesophageal reflux disease)   . History of blood product transfusion    d/t bleeding ulcer  . Hypertension   . Macular degeneration (senile) of retina   . Persistent atrial fibrillation ([place])   . PONV (postoperative nausea and vomiting)   . Presence of permanent cardiac pacemaker 2010  . Shortness of  breath dyspnea    exertion  . Sick sinus syndrome ([facility])    St. Jude  . Syncope    resolved s/p PPM    Patient Active Problem List   Diagnosis Date Noted  . Intracranial aneurysm 11/28/2016  . Gait disturbance 11/06/2016  . Muscle fatigue 11/06/2016  . Post herpetic neuralgia 11/06/2016  . Left inguinal hernia 10/06/2014  . Shortness of breath 01/03/2014  . Pacemaker-St.Jude 07/09/2012  . Permanent atrial fibrillation ([place]) 10/20/2010  . HYPERLIPIDEMIA-MIXED 10/26/2009  . Macular degeneration 12/28/2008  . CAD, NATIVE VESSEL 12/28/2008  . GERD 12/28/2008  . SYNCOPE 12/28/2008  . Hypertension 12/28/2008  . Sick sinus syndrome ([place]) 12/28/2008    Past Surgical History:  Procedure Laterality Date  . CHOLECYSTECTOMY  06/12/11  . INSERTION OF MESH Left 10/06/2014   Performed by Fanny Skates, MD at [facility]  . LAPAROSCOPIC CHOLECYSTECTOMY W/ CHOLANGIOGRAPHY  06/12/2011  . OPEN REPAIR LEFT INGUINAL HERNIA Left 10/06/2014   Performed by Fanny Skates, MD at [facility]  . PACEMAKER INSERTION  09/28/08   [facility] XL DR  . perforated ulcer    . right inguinal hernia repair  11/19/2002   with mesh  . TRANSURETHRAL RESECTION OF BLADDER TUMOR (TURBT) N/A 09/06/2011   Performed by Ailene Rud, MD at [facility]S  . TRANSURETHRAL RESECTION OF THE PROSTATE WITH GYRUS INSTRUMENTS N/A 09/06/2011   Performed by Ailene Rud, MD at [facility] ORS  .  umblical hernia  05/15/9370       Home Medications    Prior to Admission medications   Medication Sig Start Date End Date Taking? Authorizing Provider  acetaminophen (TYLENOL) 500 MG tablet Take 2 tablets (1,000 mg total) every 8 (eight) hours for 5 days by mouth. Do not take more than 4000 mg of acetaminophen (Tylenol) in a 24-hour period. Please note that other medicines that you may be prescribed may have Tylenol as well. 08/01/17 08/06/17  Fatima Blank, MD  apixaban (ELIQUIS) 5 MG TABS tablet Take 1 tablet (5 mg total) by  mouth 2 (two) times daily. 04/17/17   Allred, Jeneen Rinks, MD  carbidopa-levodopa (SINEMET) 25-100 MG tablet Take 1 tablet by mouth 3 (three) times daily. 06/14/17   Varney Biles, MD  cholecalciferol (VITAMIN D) 1000 units tablet Take 2,000 Units by mouth daily.     [provider]  Cyanocobalamin (VITAMIN B-12 CR PO) Take 1 tablet by mouth daily.     [provider]  famotidine (PEPCID) 20 MG tablet Take 20 mg by mouth daily.     [provider]  Magnesium 100 MG CAPS Take 200 mg 2 (two) times daily by mouth.    [provider]  meclizine (ANTIVERT) 25 MG tablet Take 25 mg every 6 (six) hours as needed by mouth for dizziness. 07/23/17   [provider]  traMADol (ULTRAM) 50 MG tablet Take 0.5-1 tablets (25-50 mg total) every 6 (six) hours as needed for up to 15 days by mouth. 08/01/17 08/16/17  Cardama, Grayce Sessions, MD  triamcinolone cream (KENALOG) 0.1 % Apply 1 application topically daily.  05/23/16   [provider]    Family History Family History  Problem Relation Age of Onset  . Heart attack Mother 83       MI  . Heart failure Father   . Heart attack Brother        Late 29s  . Cancer Sister     Social History Social History   Tobacco Use  . Smoking status: Former Smoker    Last attempt to quit: 09/17/1958    Years since quitting: 58.9  . Smokeless tobacco: Never Used  . Tobacco comment: quit 1968  Substance Use Topics  . Alcohol use: No  . Drug use: No     Allergies   Hydrocodone and Hydroxyzine   Review of Systems Review of Systems  Constitutional: Negative for chills, diaphoresis, fatigue, fever and weight loss.  HENT: Negative for congestion.   Respiratory: Negative for cough, chest tightness and shortness of breath.   Cardiovascular: Negative for chest pain and palpitations.  Gastrointestinal: Positive for diarrhea. Negative for abdominal pain, bowel incontinence, constipation, nausea and vomiting.  Genitourinary:  Negative for bladder incontinence, dysuria, flank pain, frequency and pelvic pain.  Musculoskeletal: Positive for back pain. Negative for neck pain and neck stiffness.  Neurological: Positive for weakness. Negative for seizures, light-headedness, numbness, headaches and paresthesias.  Psychiatric/Behavioral: Negative for agitation.  All other systems reviewed and are negative.    Physical Exam Updated Vital Signs BP (!) 136/98   Pulse 66   Temp 98.1 F (36.7 C) (Oral)   Resp 16   SpO2 94%   Physical Exam  Constitutional: He is oriented to person, place, and time. He appears well-developed and well-nourished. No distress.  HENT:  Head: Normocephalic.  Mouth/Throat: Oropharynx is clear and moist. No oropharyngeal exudate.  Eyes: Conjunctivae and EOM are normal. Pupils are equal, round, and reactive to  light.  Neck: Normal range of motion.  Cardiovascular: Normal rate and intact distal pulses.  No murmur heard. Pulmonary/Chest: Effort normal and breath sounds normal. He has no wheezes. He exhibits no tenderness.  Abdominal: Soft. Bowel sounds are normal. There is no tenderness.  Musculoskeletal: He exhibits tenderness.       Lumbar back: He exhibits tenderness and pain.       Back:  Neurological: He is alert and oriented to person, place, and time. He displays normal reflexes. No cranial nerve deficit or sensory deficit. He exhibits abnormal muscle tone.  Patient has decreased strength in the right leg compared to the left.  Normal sensation.  Normal pulses bilaterally in legs.  No significant edema seen.  Back is tender across the low back.  Lungs are clear and abdomen is nontender.  No other focal neurologic deficits.  Skin: Capillary refill takes less than 2 seconds. He is not diaphoretic. No erythema. No pallor.  Psychiatric: He has a normal mood and affect.  Nursing note and vitals reviewed.    ED Treatments / Results  Labs (all labs ordered are listed, but only abnormal  results are displayed) Labs Reviewed - No data to display  EKG  EKG Interpretation None       Radiology Ct Lumbar Spine Wo Contrast  Result Date: 08/04/2017 CLINICAL DATA:  Back pain. Diagnosed with lumbar spine fracture 3 days ago. EXAM: CT LUMBAR SPINE WITHOUT CONTRAST TECHNIQUE: Multidetector CT imaging of the lumbar spine was performed without intravenous contrast administration. Multiplanar CT image reconstructions were also generated. COMPARISON:  Abdominal CT 3 days prior. FINDINGS: Segmentation: 5 lumbar type vertebrae. Alignment: Normal. Vertebrae: L1 superior endplate fracture with acute appearing fracture lines, new from 06/14/2017 thoracic spine CT. Mild height loss is stable from 08/01/2017 abdominal CT. No retropulsion or posterior cortex involvement. No interval fracture noted. No retropulsion. Paraspinal and other soft tissues: Colonic diverticulosis. Disc levels: Usual degenerative changes. Degenerative disc narrowing that is greatest at L5-S1, and moderate. Bilateral noncompressive foraminal narrowing at L5-S1. No noted spinal stenosis. IMPRESSION: Known acute or subacute L1 superior endplate fracture with mild height loss that is stable from 3 days prior. No new abnormality. Electronically Signed   By: Monte Fantasia M.D.   On: 08/04/2017 16:26    Procedures Procedures (including critical care time)  Medications Ordered in ED Medications  traMADol (ULTRAM) tablet 50 mg (50 mg Oral Given 08/04/17 1549)     Initial Impression / Assessment and Plan / ED Course  I have reviewed the triage vital signs and the nursing notes.  Pertinent labs & imaging results that were available during my care of the patient were reviewed by me and considered in my medical decision making (see chart for details).     GOHAN COLLISTER is a 83 y.o. male with a past medical history significant for CAD, CHF with pacemaker, atrial fibrillation, and recent L1 fracture discovered several days  ago who presents with continued back pain, inability to ambulate, and right leg weakness.  He is brought in by his wife who says that patient has had difficulty with ambulation in September.  Patient was admitted to rehab for a period of time and did well.  She says that 2 weeks ago he began having severe back pain.  He presented several days ago and had CT imaging revealing a L1 fracture.  Patient was given a TLSO brace and discharged home.  Patient was doing well with the brace for the last  few days however today, he tried to get out of his chair and was having severe pain and weakness in his right leg.  He did not have numbness or weakness previously.  His wife says that he kept trying to get out of the chair and kept falling backwards.  He denies any new injuries but says that he is unable to walk due to the right leg weakness and continued back pain.  He denies any urinary changes and denies any incontinence of bowel.  He does report some mild diarrhea.  He denies recent fevers, chills, chest pain, shortness of breath, nausea, vomiting, or cough.  He denies abdominal pain.  He reports his back pain is moderate to severe.  Patient reports that he cannot get MRI due to his old pacemaker.  Patient had to have neighbors, assist to try to get him moved inside the house as he lives with his wife at home.  Patient was brought in for evaluation.  On my exam, patient had 4 out of 5 weakness in the right leg compared to the left.  Patient had no discrepancy in sensation in the legs.  Normal pulses in both legs.  Patient had tenderness across his low back.  Lungs were clear.  Chest was nontender.  Abdomen nontender.  Patient is alert and oriented.  Patient had pain with movement in his back.  As patient cannot get an MRI to evaluate for a cord cause of his right leg weakness, patient will have repeat CT imaging to evaluate for shift or change in the spine.  Patient will be given the tramadol which she reports has  helped his pain.  Consult will be placed to social work and physical therapy to evaluate for ambulatory assistance needs.    [name] reports that she is concerned he is going to be unsafe for going home as she cannot lift him and he is unable to walk at this time due to his symptoms.   Patient CT imaging showed no new abnormality seen from 3 days ago.  There is an acute/subacute L1 superior endplate fracture with mild height loss.  No other abnormality seen.  Patient reports his pain has slightly improved with medications.  At this time, he is unable to get MRI and I do not feel he requires other imaging workup.  I feel patient is safe for disposition from the hospital however I do not feel he is safe to go back to his home with his decreased ambulation and wife unable to help manage his ambulation safely.  Care coordination team with [facility] was called and they will assist in finding the safest place for the patient.  Care coordination team was able to speak with [facility] healthcare or patient has resided previously and they agreed to take him back tonight for management.     Final Clinical Impressions(s) / ED Diagnoses   Final diagnoses:  Acute midline low back pain with right-sided sciatica  Right leg weakness  Impaired ambulation    Clinical Impression: 1. Acute midline low back pain with right-sided sciatica   2. Right leg weakness   3. Impaired ambulation     Disposition: Discharge  Condition: Good  I have discussed the results, Dx and Tx plan with the pt(& family if present). He/she/they expressed understanding and agree(s) with the plan. Discharge instructions discussed at great length. Strict return precautions discussed and pt &/or family have verbalized understanding of the instructions. No further questions at time of discharge.  This SmartLink is deprecated. Use AVSMEDLIST instead to display the medication list for a patient.  Follow Up: [name],  MD [address] [place] [address] [phone]     [facility] DEPT [place] [id number] [place] [address] [phone]  If symptoms worsen      Tegeler, Gwenyth Allegra, MD 08/04/17 1902

## 2017-08-25 ENCOUNTER — Emergency Department (HOSPITAL_COMMUNITY)
Admission: EM | Admit: 2017-08-25 | Discharge: 2017-08-25 | Disposition: A | Discharge status: Home / Self Care | Payer: Medicare Other | Attending: Emergency Medicine | Admitting: Emergency Medicine

## 2017-08-25 ENCOUNTER — Encounter (HOSPITAL_COMMUNITY): Payer: Self-pay | Admitting: Emergency Medicine

## 2017-08-25 ENCOUNTER — Emergency Department (HOSPITAL_COMMUNITY): Payer: Medicare Other

## 2017-08-25 DIAGNOSIS — Z7901 Long term (current) use of anticoagulants: Secondary | ICD-10-CM | POA: Diagnosis not present

## 2017-08-25 DIAGNOSIS — G8929 Other chronic pain: Secondary | ICD-10-CM | POA: Diagnosis not present

## 2017-08-25 DIAGNOSIS — Z79899 Other long term (current) drug therapy: Secondary | ICD-10-CM | POA: Diagnosis not present

## 2017-08-25 DIAGNOSIS — R410 Disorientation, unspecified: Secondary | ICD-10-CM | POA: Diagnosis not present

## 2017-08-25 DIAGNOSIS — Z85828 Personal history of other malignant neoplasm of skin: Secondary | ICD-10-CM | POA: Diagnosis not present

## 2017-08-25 DIAGNOSIS — I2510 Atherosclerotic heart disease of native coronary artery without angina pectoris: Secondary | ICD-10-CM | POA: Diagnosis not present

## 2017-08-25 DIAGNOSIS — M545 Low back pain: Secondary | ICD-10-CM | POA: Insufficient documentation

## 2017-08-25 DIAGNOSIS — I110 Hypertensive heart disease with heart failure: Secondary | ICD-10-CM | POA: Diagnosis not present

## 2017-08-25 DIAGNOSIS — I5032 Chronic diastolic (congestive) heart failure: Secondary | ICD-10-CM | POA: Insufficient documentation

## 2017-08-25 DIAGNOSIS — Z950 Presence of cardiac pacemaker: Secondary | ICD-10-CM | POA: Insufficient documentation

## 2017-08-25 DIAGNOSIS — Z87891 Personal history of nicotine dependence: Secondary | ICD-10-CM | POA: Diagnosis not present

## 2017-08-25 LAB — CBC
HEMATOCRIT: 47 % (ref 39.0–52.0)
HEMOGLOBIN: 15.8 g/dL (ref 13.0–17.0)
MCH: 30.9 pg (ref 26.0–34.0)
MCHC: 33.6 g/dL (ref 30.0–36.0)
MCV: 91.8 fL (ref 78.0–100.0)
Platelets: 242 10*3/uL (ref 150–400)
RBC: 5.12 MIL/uL (ref 4.22–5.81)
RDW: 14 % (ref 11.5–15.5)
WBC: 13.5 10*3/uL — AB (ref 4.0–10.5)

## 2017-08-25 LAB — COMPREHENSIVE METABOLIC PANEL
ALBUMIN: 3.5 g/dL (ref 3.5–5.0)
ALK PHOS: 133 U/L — AB (ref 38–126)
ALT: 25 U/L (ref 17–63)
ANION GAP: 9 (ref 5–15)
AST: 25 U/L (ref 15–41)
BILIRUBIN TOTAL: 1 mg/dL (ref 0.3–1.2)
BUN: 10 mg/dL (ref 6–20)
CALCIUM: 9.1 mg/dL (ref 8.9–10.3)
CO2: 24 mmol/L (ref 22–32)
Chloride: 107 mmol/L (ref 101–111)
Creatinine, Ser: 0.92 mg/dL (ref 0.61–1.24)
Glucose, Bld: 133 mg/dL — ABNORMAL HIGH (ref 65–99)
POTASSIUM: 4.2 mmol/L (ref 3.5–5.1)
Sodium: 140 mmol/L (ref 135–145)
TOTAL PROTEIN: 7.2 g/dL (ref 6.5–8.1)

## 2017-08-25 LAB — CBG MONITORING, ED: GLUCOSE-CAPILLARY: 138 mg/dL — AB (ref 65–99)

## 2017-08-25 MED ORDER — ACETAMINOPHEN 325 MG PO TABS
650.0000 mg | ORAL_TABLET | Freq: Once | ORAL | Status: AC
  Administered 2017-08-25: 650 mg via ORAL
  Filled 2017-08-25: qty 2

## 2017-08-25 NOTE — ED Notes (Signed)
Pt taken to xray 

## 2017-08-25 NOTE — ED Notes (Signed)
Patient transported to X-ray 

## 2017-08-25 NOTE — ED Notes (Signed)
Pt discharged from ED; instructions provided; Pt encouraged to return to ED if symptoms worsen and to f/u with PCP; Pt verbalized understanding of all instructions 

## 2017-08-25 NOTE — Discharge Instructions (Signed)
Please follow up with Dr. Arnoldo Morale Return if worsening

## 2017-08-25 NOTE — ED Provider Notes (Signed)
[facility] EMERGENCY DEPARTMENT Provider Note   CSN: [id number] Arrival date & time: 08/25/17  1506   History   Chief Complaint Chief Complaint  Patient presents with  . Back Pain    HPI [name] is a 83 y.o. male who presents with back pain. PMH significant for CAD, CHF with pacemaker, atrial fibrillation, and recent L1 fracture. The patient is mildly confused and thinks he is on a train. He states he is "lost and wants to be found". He is asking for his wife [name]. He states his back hurt this morning but is getting better.  The patient's wife [name] was called. She states that he was discharged from [facility] last Thursday. He has been overall doing well. He walks with a walker and has been wearing his brace. PT has been coming to the house and been working with him. This morning his back started hurting him. He was given medication for pain but this didn't seem to help. The pain was severe so EMS was called. He has not had any falls.   LEVEL 5 CAVEAT due to patient's confusion  HPI  Past Medical History:  Diagnosis Date  . CAD (coronary artery disease) 09/2008  . Cancer ([place])    basal cell carcinoma  . CHF (congestive heart failure) ([employer])   . Complication of anesthesia    06/12/11 anesthesia made legs asleep also and went into chf and ended up in ICU after gallbladder surgery  . Diastolic dysfunction    hx of chf due to fluid overload 9/12   . Dysrhythmia    atrial fibrillation  . GERD (gastroesophageal reflux disease)   . History of blood product transfusion    d/t bleeding ulcer  . Hypertension   . Macular degeneration (senile) of retina   . Persistent atrial fibrillation ([place])   . PONV (postoperative nausea and vomiting)   . Presence of permanent cardiac pacemaker 2010  . Shortness of breath dyspnea    exertion  . Sick sinus syndrome ([facility])    St. Jude  . Syncope    resolved s/p PPM    Patient Active Problem List   Diagnosis Date  Noted  . Intracranial aneurysm 11/28/2016  . Gait disturbance 11/06/2016  . Muscle fatigue 11/06/2016  . Post herpetic neuralgia 11/06/2016  . Left inguinal hernia 10/06/2014  . Shortness of breath 01/03/2014  . Pacemaker-St.Jude 07/09/2012  . Permanent atrial fibrillation ([place]) 10/20/2010  . HYPERLIPIDEMIA-MIXED 10/26/2009  . Macular degeneration 12/28/2008  . CAD, NATIVE VESSEL 12/28/2008  . GERD 12/28/2008  . SYNCOPE 12/28/2008  . Hypertension 12/28/2008  . Sick sinus syndrome ([place]) 12/28/2008    Past Surgical History:  Procedure Laterality Date  . CHOLECYSTECTOMY  06/12/11  . INGUINAL HERNIA REPAIR Left 10/06/2014   Procedure: OPEN REPAIR LEFT INGUINAL HERNIA ;  Surgeon: Fanny Skates, MD;  Location: [facility];  Service: General;  Laterality: Left;  . INSERTION OF MESH Left 10/06/2014   Procedure: INSERTION OF MESH;  Surgeon: Fanny Skates, MD;  Location: [facility];  Service: General;  Laterality: Left;  . LAPAROSCOPIC CHOLECYSTECTOMY W/ CHOLANGIOGRAPHY  06/12/2011  . PACEMAKER INSERTION  09/28/08   SJM [place] XL DR  . perforated ulcer    . right inguinal hernia repair  11/19/2002   with mesh  . TRANSURETHRAL RESECTION OF BLADDER TUMOR  09/06/2011   Procedure: TRANSURETHRAL RESECTION OF BLADDER TUMOR (TURBT);  Surgeon: Ailene Rud, MD;  Location: [facility];  Service: Urology;  Laterality:  N/A;  . TRANSURETHRAL RESECTION OF PROSTATE  93/23/5573  . umblical hernia  11/06/2540     Home Medications    Prior to Admission medications   Medication Sig Start Date End Date Taking? Authorizing Provider  apixaban (ELIQUIS) 5 MG TABS tablet Take 1 tablet (5 mg total) by mouth 2 (two) times daily. 04/17/17   Allred, Jeneen Rinks, MD  carbidopa-levodopa (SINEMET) 25-100 MG tablet Take 1 tablet by mouth 3 (three) times daily. 06/14/17   Varney Biles, MD  cholecalciferol (VITAMIN D) 1000 units tablet Take 2,000 Units by mouth daily.     [provider]  Cyanocobalamin (VITAMIN B-12 CR  PO) Take 1 tablet by mouth daily.     [provider]  famotidine (PEPCID) 20 MG tablet Take 20 mg by mouth daily.     [provider]  Magnesium 100 MG CAPS Take 200 mg 2 (two) times daily by mouth.    [provider]  meclizine (ANTIVERT) 25 MG tablet Take 25 mg every 6 (six) hours as needed by mouth for dizziness. 07/23/17   [provider]  triamcinolone cream (KENALOG) 0.1 % Apply 1 application topically daily.  05/23/16   [provider]    Family History Family History  Problem Relation Age of Onset  . Heart attack Mother 75       MI  . Heart failure Father   . Heart attack Brother        Late 47s  . Cancer Sister     Social History Social History   Tobacco Use  . Smoking status: Former Smoker    Last attempt to quit: 09/17/1958    Years since quitting: 58.9  . Smokeless tobacco: Never Used  . Tobacco comment: quit 1968  Substance Use Topics  . Alcohol use: No  . Drug use: No     Allergies   Hydrocodone and Hydroxyzine   Review of Systems Review of Systems  Unable to perform ROS: Dementia     Physical Exam Updated Vital Signs BP (!) 150/96   Pulse (!) 59   Temp 97.9 F (36.6 C) (Oral)   Resp 15   SpO2 93%   Physical Exam  Constitutional: He appears well-developed and well-nourished. No distress.  Pleasant, cooperative  HENT:  Head: Normocephalic and atraumatic.  Eyes: Conjunctivae are normal. Pupils are equal, round, and reactive to light. Right eye exhibits no discharge. Left eye exhibits no discharge. No scleral icterus.  Neck: Normal range of motion.  Cardiovascular: Normal rate and regular rhythm. Exam reveals no gallop and no friction rub.  No murmur heard. Pulmonary/Chest: Effort normal and breath sounds normal. No stridor. No respiratory distress. He has no wheezes. He has no rales. He exhibits no tenderness.  Abdominal: Soft. Bowel sounds are normal. He exhibits no distension. There is no tenderness.    Musculoskeletal:  Back: Possible bruising over left thoracic back. Pt states this is from "shingles". Area is not tender. No midline lumbar tenderness. Pt is able to stand with assistance and take steps.  Neurological: He is alert. He is disoriented. GCS eye subscore is 4. GCS verbal subscore is 5. GCS motor subscore is 6.  Skin: Skin is warm and dry.  Psychiatric: He has a normal mood and affect. His behavior is normal.  Nursing note and vitals reviewed.    ED Treatments / Results  Labs (all labs ordered are listed, but only abnormal results are displayed) Labs Reviewed  COMPREHENSIVE METABOLIC PANEL - Abnormal; Notable  for the following components:      Result Value   Glucose, Bld 133 (*)    Alkaline Phosphatase 133 (*)    All other components within normal limits  CBC - Abnormal; Notable for the following components:   WBC 13.5 (*)    All other components within normal limits  CBG MONITORING, ED - Abnormal; Notable for the following components:   Glucose-Capillary 138 (*)    All other components within normal limits    EKG  EKG Interpretation  Date/Time:  Sunday August 25 2017 17:50:32 EST Ventricular Rate:  71 PR Interval:    QRS Duration: 91 QT Interval:  412 QTC Calculation: 448 R Axis:   96 Text Interpretation:  Afib/flut and V-paced complexes No further rhythm analysis attempted due to paced rhythm Right axis deviation Low voltage, precordial leads Repol abnrm, global ischemia, diffuse leads Atrial fibrillation /flutter is persistant/permanent  Not currently V-pacing Confirmed by Daleen Bo [phone]) on 08/25/2017 5:58:11 PM       Radiology Dg Chest 2 View  Result Date: 08/25/2017 CLINICAL DATA:  Chest and back pain EXAM: CHEST  2 VIEW COMPARISON:  May 26, 2016 FINDINGS: There is slight scarring in the bases. There is no edema or consolidation. Heart is upper normal in size with pulmonary vascularity within normal limits. Pacemaker leads are attached to  the right atrium and right ventricle. No adenopathy. Bones are osteoporotic. There is an old healed fracture of the right lateral seventh rib. IMPRESSION: Mild scarring in the bases. No edema or consolidation. Stable cardiac silhouette. Electronically Signed   By: Lowella Grip III M.D.   On: 08/25/2017 17:32   Dg Lumbar Spine Complete  Result Date: 08/25/2017 CLINICAL DATA:  Back pain for a month, worsening after physical therapy yesterday. EXAM: LUMBAR SPINE - COMPLETE 4+ VIEW COMPARISON:  Lumbar spine CT August 04, 2017 FINDINGS: Mild-to-moderate L1 compression fracture, with similar to slightly increased height loss given difference in imaging technique. The remaining lumbar vertebral bodies intact. Moderate unchanged L5-S1 disc height loss with multilevel mild endplate spurring compatible with degenerative discs. Moderate L5-S1 facet arthropathy. Osteopenia. Multiple loops of gas-filled nondistended small and large bowel. Surgical anastomotic clips RIGHT upper quadrant. Additional surgical staples. Pacemaker wire project in the chest. IMPRESSION: Re- demonstration of L1 compression fracture with mild to moderate height loss, similar to slightly progressed given differences in imaging technique. Osteopenia, decreasing sensitivity for acute nondisplaced fractures. No new fracture. No malalignment. Electronically Signed   By: Elon Alas M.D.   On: 08/25/2017 17:34   Ct Head Wo Contrast  Result Date: 08/25/2017 CLINICAL DATA:  Back pain for 1 month due to compression fracture, altered mental status, history CHF, coronary artery disease, hypertension, atrial fibrillation EXAM: CT HEAD WITHOUT CONTRAST TECHNIQUE: Contiguous axial images were obtained from the base of the skull through the vertex without intravenous contrast. Sagittal and coronal MPR images reconstructed from axial data set. COMPARISON:  06/14/2017 FINDINGS: Brain: Generalized atrophy. Normal ventricular morphology. No midline  shift or mass effect. Small vessel chronic ischemic changes of deep cerebral white matter. White matter infarct adjacent to frontal horn of RIGHT lateral ventricle, unchanged. No intracranial hemorrhage, mass lesion, evidence of acute infarction, or extra-axial fluid collection. Vascular: Atherosclerotic calcification of internal carotid arteries at skullbase. Prominent proximal LEFT MCA measuring 4.5 mm diameter, unchanged. No hyperdense vessels. Skull: Intact Sinuses/Orbits: Scattered mucosal thickening in ethmoid air cells with small amount of mucous in the sphenoid sinus. Remaining visualized paranasal sinuses and mastoid  air cells clear Other: N/A IMPRESSION: Atrophy with small vessel chronic ischemic changes of deep cerebral white matter. 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 No acute intracranial abnormalities. Electronically Signed   By: Lavonia Dana M.D.   On: 08/25/2017 16:44    Procedures Procedures (including critical care time)  Medications Ordered in ED Medications  acetaminophen (TYLENOL) tablet 650 mg (650 mg Oral Given 08/25/17 1858)     Initial Impression / Assessment and Plan / ED Course  I have reviewed the triage vital signs and the nursing notes.  Pertinent labs & imaging results that were available during my care of the patient were reviewed by me and considered in my medical decision making (see chart for details).  83 year old male presents with atraumatic worsening of his chronic back pain.  He is hypertensive otherwise vital signs are normal.  Exam is overall unremarkable however the patient seems very confused therefore CT head, labs, CXR ordered in additional to lumbar films. Tylenol was ordered.  CT head is negative. CXR is negative. Lumbar xray is overall similar. Patient reports significant improvement in his pain without any intervention.  He has been able to ambulate with assistance which is his baseline. CBC shows a leukocytosis which is  likely reactive. CMP is normal. I think most likely his pain is due to working with PT. [name] was called and I discussed results with her as well. The patient was discharged and he was advised to follow up with neurosurgery and return if worsening.  Final Clinical Impressions(s) / ED Diagnoses   Final diagnoses:  Chronic bilateral low back pain without sciatica    ED Discharge Orders    None       Recardo Evangelist, PA-C 08/26/17 1715    Daleen Bo, MD 08/29/17 2140

## 2017-08-25 NOTE — ED Provider Notes (Signed)
  Face-to-face evaluation   History: Patient, who lives with wife at home, presents by EMS for evaluation of back pain.  He is confused and is unable to provide any history.  He is apparently taking tramadol at home for pain.  Physical exam: Elderly man who is alert and confused.  No respiratory distress.  Mild occasional cough.  Lungs clear anteriorly.  Heart was regular rhythm, not tachycardia.    EKG Interpretation  Date/Time:  Sunday August 25 2017 17:31:19 EST Ventricular Rate:  71 PR Interval:    QRS Duration: 154 QT Interval:  464 QTC Calculation: 483 R Axis:   -77 Text Interpretation:  Atrial fibrillation Appears to be V-paced. Will repeat with pacer sensing mode turned on. Confirmed by Daleen Bo [phone]) on 08/25/2017 5:40:35 PM        EKG Interpretation  Date/Time:  Sunday August 25 2017 17:50:32 EST Ventricular Rate:  71 PR Interval:    QRS Duration: 91 QT Interval:  412 QTC Calculation: 448 R Axis:   96 Text Interpretation:  Afib/flut and V-paced complexes No further rhythm analysis attempted due to paced rhythm Right axis deviation Low voltage, precordial leads Repol abnrm, global ischemia, diffuse leads Atrial fibrillation /flutter is persistant/permanent  Not currently V-pacing Confirmed by Daleen Bo [phone]) on 08/25/2017 5:58:11 PM      Medical screening examination/treatment/procedure(s) were conducted as a shared visit with non-physician practitioner(s) and myself.  I personally evaluated the patient during the encounter    Daleen Bo, MD 08/29/17 2139
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
## 2017-08-25 NOTE — ED Notes (Signed)
Pt ambulated effectively with minimal assistance. Pt normally uses a walker at home.

## 2017-08-28 ENCOUNTER — Ambulatory Visit: Payer: Medicare Other | Admitting: Family Medicine

## 2017-09-05 DIAGNOSIS — R001 Bradycardia, unspecified: Secondary | ICD-10-CM | POA: Diagnosis not present

## 2017-10-04 ENCOUNTER — Other Ambulatory Visit: Payer: Self-pay | Admitting: Internal Medicine

## 2017-10-07 ENCOUNTER — Telehealth (HOSPITAL_COMMUNITY): Payer: Self-pay

## 2017-10-07 NOTE — Telephone Encounter (Signed)
Left message for [name] stating that the pt's needs an CT T11-S1 ordered for compression fracture eval. AW

## 2017-10-10 ENCOUNTER — Ambulatory Visit (INDEPENDENT_AMBULATORY_CARE_PROVIDER_SITE_OTHER): Payer: Medicare Other | Admitting: *Deleted

## 2017-10-10 DIAGNOSIS — I495 Sick sinus syndrome: Secondary | ICD-10-CM

## 2017-10-10 LAB — CUP PACEART INCLINIC DEVICE CHECK
Battery Remaining Longevity: 21 mo
Date Time Interrogation Session: 20190124161736
Implantable Lead Implant Date: 20100112
Implantable Lead Location: 753859
Lead Channel Impedance Value: 356 Ohm
Lead Channel Pacing Threshold Pulse Width: 0.4 ms
Lead Channel Sensing Intrinsic Amplitude: 6.5 mV
MDC IDC LEAD IMPLANT DT: 20100112
MDC IDC LEAD LOCATION: 753860
MDC IDC MSMT BATTERY IMPEDANCE: 6600 Ohm
MDC IDC MSMT BATTERY VOLTAGE: 2.72 V
MDC IDC MSMT LEADCHNL RV PACING THRESHOLD AMPLITUDE: 0.5 V
MDC IDC PG IMPLANT DT: 20100112
MDC IDC PG SERIAL: 1284954
MDC IDC SET LEADCHNL RV PACING AMPLITUDE: 2.5 V
MDC IDC SET LEADCHNL RV PACING PULSEWIDTH: 0.4 ms
MDC IDC SET LEADCHNL RV SENSING SENSITIVITY: 1.5 mV

## 2017-10-10 NOTE — Progress Notes (Signed)
Pacemaker check in clinic. Normal device function. Threshold, sensing, impedance consistent with previous measurements. Device programmed to maximize longevity. Episode Triggers are off. Device programmed at appropriate safety margins. Histogram distribution blunted, patient states due to stability. Device programmed to optimize intrinsic conduction. Estimated longevity 1.75 years. Patient education completed. ROV with AS in 6 months.

## 2017-10-12 ENCOUNTER — Other Ambulatory Visit: Payer: Self-pay | Admitting: Internal Medicine

## 2017-10-14 ENCOUNTER — Other Ambulatory Visit: Payer: Self-pay | Admitting: Internal Medicine

## 2017-10-14 DIAGNOSIS — M545 Low back pain: Secondary | ICD-10-CM

## 2017-10-18 ENCOUNTER — Other Ambulatory Visit: Payer: Medicare Other

## 2017-10-22 ENCOUNTER — Telehealth: Payer: Self-pay

## 2017-10-22 NOTE — Telephone Encounter (Signed)
I received a PA request form via fax from [place] to complete for the pts Eliquis.  I have completed the form and Dr Rayann Heman has signed it. I have it back to [employer].

## 2017-10-23 NOTE — Telephone Encounter (Signed)
**Note De-Identified [employer] Obfuscation** Approval on the pts Eliquis PA received Seerat Peaden fax from [place]. Approval good from 09/23/2017 until 10/23/2018.  I have notified the pts pharmacy.

## 2017-10-29 ENCOUNTER — Ambulatory Visit
Admission: RE | Admit: 2017-10-29 | Discharge: 2017-10-29 | Disposition: A | Discharge status: Home / Self Care | Payer: Medicare Other | Source: Ambulatory Visit | Attending: Internal Medicine | Admitting: Internal Medicine

## 2017-10-29 DIAGNOSIS — M545 Low back pain: Secondary | ICD-10-CM

## 2017-11-27 ENCOUNTER — Ambulatory Visit (INDEPENDENT_AMBULATORY_CARE_PROVIDER_SITE_OTHER): Payer: Medicare Other | Admitting: Podiatry

## 2017-11-27 ENCOUNTER — Encounter: Payer: Self-pay | Admitting: Podiatry

## 2017-11-27 DIAGNOSIS — B351 Tinea unguium: Secondary | ICD-10-CM

## 2017-11-27 DIAGNOSIS — D689 Coagulation defect, unspecified: Secondary | ICD-10-CM | POA: Diagnosis not present

## 2017-11-27 DIAGNOSIS — M79674 Pain in right toe(s): Secondary | ICD-10-CM

## 2017-11-27 DIAGNOSIS — M79675 Pain in left toe(s): Secondary | ICD-10-CM | POA: Diagnosis not present

## 2017-11-27 NOTE — Progress Notes (Signed)
Complaint:  Visit Type: Patient returns to my office for continued preventative foot care services. Complaint: Patient states" my nails have grown long and thick and become painful to walk and wear shoes" Patient is taking eliquiss.. The patient presents for preventative foot care services. No changes to ROS  Podiatric Exam: Vascular: dorsalis pedis and posterior tibial pulses are palpable bilateral. Capillary return is immediate. Temperature gradient is WNL. Skin turgor WNL  Sensorium: Normal Semmes Weinstein monofilament test. Normal tactile sensation bilaterally. Nail Exam: Pt has thick disfigured discolored nails with subungual debris noted bilateral entire nail hallux through fifth toenails Ulcer Exam: There is no evidence of ulcer or pre-ulcerative changes or infection. Orthopedic Exam: Muscle tone and strength are WNL. No limitations in general ROM. No crepitus or effusions noted. Foot type and digits show no abnormalities. Bony prominences are unremarkable. Skin: No Porokeratosis. No infection or ulcers  Diagnosis:  Onychomycosis, , Pain in right toe, pain in left toes  Treatment & Plan Procedures and Treatment: Consent by patient was obtained for treatment procedures.   Debridement of mycotic and hypertrophic toenails, 1 through 5 bilateral and clearing of subungual debris. No ulceration, no infection noted.  Return Visit-Office Procedure: Patient instructed to return to the office for a follow up visit prn  for continued evaluation and treatment.    Amillion Scobee DPM 

## 2017-12-04 ENCOUNTER — Other Ambulatory Visit (HOSPITAL_COMMUNITY): Payer: Self-pay | Admitting: Interventional Radiology

## 2017-12-04 DIAGNOSIS — S32010A Wedge compression fracture of first lumbar vertebra, initial encounter for closed fracture: Secondary | ICD-10-CM

## 2017-12-12 ENCOUNTER — Telehealth: Payer: Self-pay | Admitting: Internal Medicine

## 2017-12-12 ENCOUNTER — Telehealth: Payer: Self-pay | Admitting: Student

## 2017-12-12 ENCOUNTER — Ambulatory Visit (HOSPITAL_COMMUNITY)
Admission: RE | Admit: 2017-12-12 | Discharge: 2017-12-12 | Disposition: A | Discharge status: Home / Self Care | Payer: Medicare Other | Source: Ambulatory Visit | Attending: Interventional Radiology | Admitting: Interventional Radiology

## 2017-12-12 DIAGNOSIS — S32010A Wedge compression fracture of first lumbar vertebra, initial encounter for closed fracture: Secondary | ICD-10-CM

## 2017-12-12 HISTORY — PX: IR RADIOLOGIST EVAL & MGMT: IMG5224

## 2017-12-12 NOTE — Telephone Encounter (Signed)
° °  [facility] Pre-operative Risk Assessment    Request for surgical clearance:  1. What type of surgery is being performed? Kyphoplasty  2. When is this surgery scheduled? No date yet   3. What type of clearance is required (medical clearance vs. Pharmacy clearance to hold med vs. Both)? Medical  4. Are there any medications that need to be held prior to surgery and how long?No  5. Practice name and name of physician performing surgery? [facility]/ Dr. Estanislado Pandy  6. What is your office phone and fax number? [phone] -REQUESTING VERBAL  7. Anesthesia type (None, local, MAC, general) ?    Melissa A Tatum 12/12/2017, 4:05 PM  _________________________________________________________________   (provider comments below)

## 2017-12-12 NOTE — Telephone Encounter (Signed)
Called patient's cardiologist's (Dr. Thompson Grayer) office regarding clearance to hold patient's medication. Spoke with Melissa at office.  Explained that we are planning for an image-guided kyphoplasty procedure next week, and we will need to hold patient's Eliquis for 48 hours prior to procedure. State that we plan to bridge the patient with Aspirin 81mg  during those 48 hours. State that the procedure is not scheduled yet, as Dr. Estanislado Pandy and I wanted to confirm clearance with cardiology before scheduling.   Melissa informed me that she will relay the message to their office nurse who will call me with their decision.  Bea Graff Lyne Khurana, PA-C 12/12/2017, 4:00 PM

## 2017-12-13 ENCOUNTER — Other Ambulatory Visit (HOSPITAL_COMMUNITY): Payer: Self-pay | Admitting: Interventional Radiology

## 2017-12-13 ENCOUNTER — Encounter (HOSPITAL_COMMUNITY): Payer: Self-pay | Admitting: Interventional Radiology

## 2017-12-13 DIAGNOSIS — M4850XA Collapsed vertebra, not elsewhere classified, site unspecified, initial encounter for fracture: Secondary | ICD-10-CM

## 2017-12-13 DIAGNOSIS — M549 Dorsalgia, unspecified: Secondary | ICD-10-CM

## 2017-12-16 NOTE — Telephone Encounter (Signed)
Left message for the patient to call back, although there is no date on this Kyphoplasty procedure, patient does have a IR procedure scheduled for this Thur with instruction to hold eliquis for 2 days. Unfortunately, I was unable to reach him. This clearance will need to be prioritized for tomorrow. Will also forward clearance APP to signal urgency. Forward to clinical pharmacist as well

## 2017-12-17 ENCOUNTER — Telehealth: Payer: Self-pay | Admitting: Internal Medicine

## 2017-12-17 NOTE — Telephone Encounter (Signed)
Pt's wife was return [employer] called. Wife states that she didn't know what the call was for. Pt's wife was made aware that PA was calling to let pt know about  Clearance for Hypoplasty and IR procedure scheduled for 4/4. Pt is to hold Eliquis for 2 days prior the procedure. Pt's wife states that pt spoke with the MD that is doing the procedures and was aware of clearance and recommendations. Pt and wife verbalized understanding.

## 2017-12-17 NOTE — Telephone Encounter (Signed)
   Primary Cardiologist:James Allred, MD  Message left for patient to call back (12/16/17, 12/17/17)  Chart reviewed as part of pre-operative protocol coverage.  He has a history of diastolic heart failure, atrial fibrillation, pacemaker, mild non-obstructive CAD by cath in 2010.  Nuclear stress test in 2015 was negative for ischemia.  Echocardiogram in 2015 demonstrated EF 50-55.  He was last seen by Dr. Rayann Heman in June 2018.  He does have a CT from 12/18 that demonstrates an old R frontal infarct. CHADS2-VASc= 7 (CHF, CAD, age x 2, HTN, CVA).    Chart reviewed by pharmacy.  Patient should hold Apixaban x 3 days prior to procedure and resume the day after (at discretion of performing MD).  Due to the time since last visit, technically the patient should have an appointment.  However, the procedure is scheduled this week.  We will try to reach the patient by phone to review his symptoms since last seen to determine if he can be cleared or if he needs an appointment first.   I will forward this phone note to Dr. Estanislado Pandy so that he understands we have not been able to reach the patient and, at this point, cannot make recommendations on surgical risk.  Richardson Dopp, PA-C  12/17/2017, 2:09 PM

## 2017-12-17 NOTE — Telephone Encounter (Signed)
Patient with diagnosis of atrial fibrillation  on Eliquis for anticoagulation.    Procedure: kyphoplasty Date of procedure: TBD   CHADS2-VASc score of  5 (CHF, HTN, AGE x 2,  CAD)  CrCl 65.7 Platelet count 242  Per office protocol, patient can hold Eliquis for 3 days prior to procedure.    If not bridging, patient should restart Eliquis on the day after, at discretion of procedure MD

## 2017-12-17 NOTE — Telephone Encounter (Signed)
New Message:     Pt's wife states they got a call on yesterday but her husband was unable to understand the message being left. She states she believes it was just letting them know he has been cleared for surgery on 4/4

## 2017-12-18 ENCOUNTER — Other Ambulatory Visit: Payer: Self-pay | Admitting: Radiology

## 2017-12-19 ENCOUNTER — Encounter (HOSPITAL_COMMUNITY): Payer: Self-pay

## 2017-12-19 ENCOUNTER — Ambulatory Visit (HOSPITAL_COMMUNITY)
Admission: RE | Admit: 2017-12-19 | Discharge: 2017-12-19 | Disposition: A | Discharge status: Home / Self Care | Payer: Medicare Other | Source: Ambulatory Visit | Attending: Interventional Radiology | Admitting: Interventional Radiology

## 2017-12-19 DIAGNOSIS — Z85828 Personal history of other malignant neoplasm of skin: Secondary | ICD-10-CM | POA: Diagnosis not present

## 2017-12-19 DIAGNOSIS — Z8249 Family history of ischemic heart disease and other diseases of the circulatory system: Secondary | ICD-10-CM | POA: Diagnosis not present

## 2017-12-19 DIAGNOSIS — K219 Gastro-esophageal reflux disease without esophagitis: Secondary | ICD-10-CM | POA: Diagnosis not present

## 2017-12-19 DIAGNOSIS — I481 Persistent atrial fibrillation: Secondary | ICD-10-CM | POA: Insufficient documentation

## 2017-12-19 DIAGNOSIS — Z87891 Personal history of nicotine dependence: Secondary | ICD-10-CM | POA: Insufficient documentation

## 2017-12-19 DIAGNOSIS — H3530 Unspecified macular degeneration: Secondary | ICD-10-CM | POA: Insufficient documentation

## 2017-12-19 DIAGNOSIS — I110 Hypertensive heart disease with heart failure: Secondary | ICD-10-CM | POA: Diagnosis not present

## 2017-12-19 DIAGNOSIS — Z7902 Long term (current) use of antithrombotics/antiplatelets: Secondary | ICD-10-CM | POA: Diagnosis not present

## 2017-12-19 DIAGNOSIS — Z885 Allergy status to narcotic agent status: Secondary | ICD-10-CM | POA: Diagnosis not present

## 2017-12-19 DIAGNOSIS — M4850XA Collapsed vertebra, not elsewhere classified, site unspecified, initial encounter for fracture: Secondary | ICD-10-CM | POA: Insufficient documentation

## 2017-12-19 DIAGNOSIS — Z9889 Other specified postprocedural states: Secondary | ICD-10-CM | POA: Diagnosis not present

## 2017-12-19 DIAGNOSIS — Z950 Presence of cardiac pacemaker: Secondary | ICD-10-CM | POA: Insufficient documentation

## 2017-12-19 DIAGNOSIS — I2510 Atherosclerotic heart disease of native coronary artery without angina pectoris: Secondary | ICD-10-CM | POA: Diagnosis not present

## 2017-12-19 DIAGNOSIS — M549 Dorsalgia, unspecified: Secondary | ICD-10-CM | POA: Diagnosis present

## 2017-12-19 DIAGNOSIS — I509 Heart failure, unspecified: Secondary | ICD-10-CM | POA: Insufficient documentation

## 2017-12-19 LAB — BASIC METABOLIC PANEL
Anion gap: 9 (ref 5–15)
BUN: 14 mg/dL (ref 6–20)
CHLORIDE: 107 mmol/L (ref 101–111)
CO2: 25 mmol/L (ref 22–32)
CREATININE: 0.9 mg/dL (ref 0.61–1.24)
Calcium: 8.9 mg/dL (ref 8.9–10.3)
GFR calc Af Amer: >60 mL/min (ref >60)
GFR calc non Af Amer: >60 mL/min (ref >60)
Glucose, Bld: 108 mg/dL — ABNORMAL HIGH (ref 65–99)
POTASSIUM: 4.2 mmol/L (ref 3.5–5.1)
Sodium: 141 mmol/L (ref 135–145)

## 2017-12-19 LAB — PROTIME-INR
INR: 1.03
Prothrombin Time: 13.5 seconds (ref 11.4–15.2)

## 2017-12-19 LAB — CBC
HEMATOCRIT: 46.8 % (ref 39.0–52.0)
Hemoglobin: 15.1 g/dL (ref 13.0–17.0)
MCH: 29.9 pg (ref 26.0–34.0)
MCHC: 32.3 g/dL (ref 30.0–36.0)
MCV: 92.7 fL (ref 78.0–100.0)
PLATELETS: 246 10*3/uL (ref 150–400)
RBC: 5.05 MIL/uL (ref 4.22–5.81)
RDW: 14.4 % (ref 11.5–15.5)
WBC: 7.3 10*3/uL (ref 4.0–10.5)

## 2017-12-19 MED ORDER — SODIUM CHLORIDE 0.9 % IV SOLN
INTRAVENOUS | Status: DC
  Administered 2017-12-19: 11:00:00 via INTRAVENOUS

## 2017-12-19 MED ORDER — CEFAZOLIN SODIUM-DEXTROSE 2-4 GM/100ML-% IV SOLN: 2.0000 g | INTRAVENOUS | Status: DC

## 2017-12-19 NOTE — H&P (Signed)
Chief Complaint: Patient was seen in consultation today for Lumbar 1 Kyphoplasty at the request of Dr [name]   Supervising Physician: Luanne Bras  Patient Status: [facility] - Out-pt  History of Present Illness: [name] is a 84 y.o. male   Lumbar 1 compression fracture  No known injury Pain has diminished some--- hard to tell per wife Pt has Parksinson's-- and he gets confused and forgets pain  CT 10/29/2017: 1. Progression of L1 compression fracture relative to 08/04/2017, now height loss of 50%. The chronicity of the fracture is otherwise indeterminate. 2. Moderate right and severe left neural foraminal stenosis at L5-S1.   Using Eliquis for Afib- Last dose Mon am 4/1  Consulted with Dr Estanislado Pandy 3/28 Scheduled now for L1 painful fracture Kyphoplasty    Past Medical History:  Diagnosis Date  . CAD (coronary artery disease) 09/2008  . Cancer ([place])    basal cell carcinoma  . CHF (congestive heart failure) ([place])   . Complication of anesthesia    06/12/11 anesthesia made legs asleep also and went into chf and ended up in ICU after gallbladder surgery  . Diastolic dysfunction    hx of chf due to fluid overload 9/12   . Dysrhythmia    atrial fibrillation  . GERD (gastroesophageal reflux disease)   . History of blood product transfusion    d/t bleeding ulcer  . Hypertension   . Macular degeneration (senile) of retina   . Persistent atrial fibrillation ([place])   . PONV (postoperative nausea and vomiting)   . Presence of permanent cardiac pacemaker 2010  . Shortness of breath dyspnea    exertion  . Sick sinus syndrome (HCC)    St. Jude  . Syncope    resolved s/p PPM    Past Surgical History:  Procedure Laterality Date  . CHOLECYSTECTOMY  06/12/11  . INGUINAL HERNIA REPAIR Left 10/06/2014   Procedure: OPEN REPAIR LEFT INGUINAL HERNIA ;  Surgeon: Fanny Skates, MD;  Location: [place];  Service: General;  Laterality: Left;  . INSERTION OF MESH Left  10/06/2014   Procedure: INSERTION OF MESH;  Surgeon: Fanny Skates, MD;  Location: [place];  Service: General;  Laterality: Left;  . IR RADIOLOGIST EVAL & MGMT  12/12/2017  . LAPAROSCOPIC CHOLECYSTECTOMY W/ CHOLANGIOGRAPHY  06/12/2011  . PACEMAKER INSERTION  09/28/08   S[facility] [place] XL DR  . perforated ulcer    . right inguinal hernia repair  11/19/2002   with mesh  . TRANSURETHRAL RESECTION OF BLADDER TUMOR  09/06/2011   Procedure: TRANSURETHRAL RESECTION OF BLADDER TUMOR (TURBT);  Surgeon: Ailene Rud, MD;  Location: [facility];  Service: Urology;  Laterality: N/A;  . TRANSURETHRAL RESECTION OF PROSTATE  96/12/5407  . umblical hernia  04/27/9146    Allergies: Hydrocodone and Hydroxyzine  Medications: Prior to Admission medications   Medication Sig Start Date End Date Taking? Authorizing Provider  apixaban (ELIQUIS) 5 MG TABS tablet Take 1 tablet (5 mg total) by mouth 2 (two) times daily. 04/17/17  Yes Allred, Jeneen Rinks, MD  carbidopa-levodopa (SINEMET) 25-100 MG tablet Take 1 tablet by mouth 3 (three) times daily. 06/14/17  Yes Varney Biles, MD  Cholecalciferol (VITAMIN D) 2000 units tablet Take 2,000 Units by mouth daily.    Yes [provider]  Magnesium 250 MG TABS Take 500 mg by mouth daily.    Yes [provider]  ranitidine (ZANTAC) 150 MG tablet Take 150 mg by mouth daily.   Yes [provider]  triamcinolone cream (KENALOG) 0.1 % Apply 1 application topically daily as needed (for irritation).  05/23/16  Yes [provider]     Family History  Problem Relation Age of Onset  . Heart attack Mother 74       MI  . Heart failure Father   . Heart attack Brother        Late 28s  . Cancer Sister     Social History   Socioeconomic History  . Marital status: Married    Spouse name: Not on file  . Number of children: Not on file  . Years of education: Not on file  . Highest education level: Not on file  Occupational History  . Not on file    Social Needs  . Financial resource strain: Not on file  . Food insecurity:    Worry: Not on file    Inability: Not on file  . Transportation needs:    Medical: Not on file    Non-medical: Not on file  Tobacco Use  . Smoking status: Former Smoker    Last attempt to quit: 09/17/1958    Years since quitting: 59.2  . Smokeless tobacco: Never Used  . Tobacco comment: quit 1968  Substance and Sexual Activity  . Alcohol use: No  . Drug use: No  . Sexual activity: Not on file  Lifestyle  . Physical activity:    Days per week: Not on file    Minutes per session: Not on file  . Stress: Not on file  Relationships  . Social connections:    Talks on phone: Not on file    Gets together: Not on file    Attends religious service: Not on file    Active member of club or organization: Not on file    Attends meetings of clubs or organizations: Not on file    Relationship status: Not on file  Other Topics Concern  . Not on file  Social History Narrative  . Not on file    Review of Systems: A 12 point ROS discussed and pertinent positives are indicated in the HPI above.  All other systems are negative.  Review of Systems  Constitutional: Negative for activity change, appetite change and fatigue.  Respiratory: Negative for cough and shortness of breath.   Cardiovascular: Negative for chest pain.  Gastrointestinal: Negative for abdominal pain.  Musculoskeletal: Positive for back pain and gait problem.  Neurological: Negative for weakness.  Psychiatric/Behavioral: Negative for behavioral problems and confusion.    Vital Signs: BP (!) 147/89 (BP Location: Right Arm)   Pulse 62   Temp (!) 97.4 F (36.3 C) (Oral)   SpO2 97%   Physical Exam  Constitutional: He is oriented to person, place, and time.  Cardiovascular: Normal rate, regular rhythm and normal heart sounds.  Pulmonary/Chest: Effort normal and breath sounds normal.  Abdominal: Soft. Bowel sounds are normal.   Musculoskeletal: Normal range of motion.  Mid to low back pain- minimal  Neurological: He is alert and oriented to person, place, and time.  Skin: Skin is warm and dry.  Psychiatric: He has a normal mood and affect. His behavior is normal. Judgment and thought content normal.  Wife signes consents-- macular degeneration  Nursing note and vitals reviewed.   Imaging: Ir Radiologist Eval & Mgmt  Result Date: 12/13/2017 EXAM: NEW PATIENT OFFICE VISIT CHIEF COMPLAINT: Lumbar 1 compression fracture Current Pain Level: 0 HISTORY OF PRESENT ILLNESS: Hx Lumbar 1 compression fracture with no known injury. CT  abdomen/pelvis 08/01/2017: 1. Age indeterminate mild compression fracture of the L1 superior endplate, but suspicious for a subacute fracture and the symptomatic abnormality given absence of other findings on this study. If specific therapy such as vertebroplasty is desired Nuclear medicine Whole-body Bone Scan would best determine acuity(assuming non MRI compatible pacemaker). 2. No other acute or inflammatory findings. 3. Severe diverticulosis of the colon from the transverse to the sigmoid, but no active inflammation. 4. Aortic Atherosclerosis (ICD10-I70.0) and Emphysema (ICD10-J43.9). Areas of pulmonary fibrosis at the lung bases. Cardiomegaly. 5. Progressed fat containing supraumbilical midline ventral abdominal hernia since 2015, now up to 5.3 cm diameter. CT lumbar spine 08/04/2017: 1. Known acute or subacute L1 superior endplate fracture with mild height loss that is stable from 3 days prior. No new abnormality. DG lumbar spine 08/25/2017: 1. Re- demonstration of L1 compression fracture with mild to moderate height loss, similar to slightly progressed given differences in imaging technique. 2. Osteopenia, decreasing sensitivity for acute nondisplaced fractures. No new fracture. No malalignment. CT thoracic/lumbar spine 10/29/2017: 1. Osteopenia with multilevel chronic height loss but no acute  abnormality or spinal canal stenosis. 2. Progression of L1 compression fracture relative to 08/04/2017, now height loss of 50%. The chronicity of the fracture is otherwise indeterminate. 3. Moderate right and severe left neural foraminal stenosis at L5-S1. IR consulted by Dr. [name] for possible image-guided kyphoplasty for management of lumbar 1 compression fracture. Patient presents today with wife. States that he is in no pain at this time. Wife states that patient has history of Parkinson's disease and sometimes gets confused and does not remember saying he is in pain. Wife states that her husband was in pain when the fracture was first discovered in 07/2017, but his pain has improved since then. Denies numbness/tingling down legs or bladder/bowel incontinence. Patient is taking Eliquis 5mg  twice a day. His cardiologist is Dr. Thompson Grayer. Past Medical History: Hypertension, atrial fibrillation, high cholesterol, Parkinson's disease, macular degeneration, osteoporosis, GERD Past Surgical History: Perforated ulcer repair, umbilical hernia repair, right inguinal hernia repair, cholecystectomy, pacemaker insertion, transurethral resection of prostate/bladder tumor Medications: Eliquis, Sinemet, Pepcid, Magnesium, Vitamin-D, Vitamin B12 Allergies: Hydrocodone, Hydroxyzine Social History: Patient is married. He lives at home with wife. He is current not employed. Denies alcohol, tobacco, recreational drugs, and caffeine use. Family History:  Cancer, heart problems REVIEW OF SYSTEMS: Constitutional: Denies fever activity change. Respiratory: Denies shortness of breath or wheezing. Cardiovascular: Denies chest pain or palpitations. GI: Denies bowel incontinence. GU: Denies bladder incontinence. Musculoskeletal: Denies back pain. Neurological: Denies weakness or numbness/tingling. PHYSICAL EXAMINATION: Constitutional: Well-developed well-nourished male in no acute distress. Cardiovascular normal rate, regular  rhythm, no murmur. Pulmonary: Effort normal, breath sounds normal, no wheezes. Neurological: Alert oriented x3. Psychiatric: Mood, affect, behavior, thought content, and judgment normal. ASSESSMENT AND PLAN: Lumbar 1 compression fracture Reviewed imaging with patient and wife. Explained benefits and risks to procedure. Patient and wife wish to move forward with the procedure. Plan to schedule image-guided lumbar 1 kyphoplasty ASAP. Will discuss procedure with patient's cardiologist, Dr. Thompson Grayer, as patient is taking Eliquis need to hold this medication for 48 hours prior to procedure. Instructed patient that if Dr. Rayann Heman approves Eliquis hold, patient is to stop taking Eliquis 48 hours prior to procedure. During these d48 hours, we will bridge this with Aspirin 81mg  once a day. All questions answered and concerns addressed. Patient and wife understand and agree with plan. Read by: Earley Abide, PA-C Electronically Signed   By: Willaim Rayas  Deveshwar M.D.   On: 12/12/2017 15:43    Labs:  CBC: Recent Labs    06/14/17 0900 06/14/17 0911 08/01/17 1314 08/25/17 1737  WBC 5.8  --  10.3 13.5*  HGB 14.0 15.6 15.3 15.8  HCT 44.0 46.0 45.4 47.0  PLT 192  --  224 242    COAGS: Recent Labs    06/14/17 0900  INR 1.22  APTT 25    BMP: Recent Labs    06/14/17 0900 06/14/17 0911 08/01/17 1314 08/25/17 1737  NA 138 140 138 140  K 3.6 3.5 4.6 4.2  CL 107 103 104 107  CO2 25  --  26 24  GLUCOSE 117* 115* 119* 133*  BUN 12 15 17 10   CALCIUM 8.7*  --  9.0 9.1  CREATININE 0.85 0.80 0.92 0.92  GFRNONAA >60  --  >60 >60  GFRAA >60  --  >60 >60    LIVER FUNCTION TESTS: Recent Labs    06/14/17 0900 08/01/17 1314 08/25/17 1737  BILITOT 0.7 0.8 1.0  AST 19 21 25   ALT 16* 7* 25  ALKPHOS 76 101 133*  PROT 6.8 7.4 7.2  ALBUMIN 3.5 3.6 3.5    TUMOR MARKERS: No results for input(s): AFPTM, CEA, CA199, CHROMGRNA in the last 8760 hours.  Assessment and Plan:  Lumbar 1 progressive  fracture Scheduled for Kyphoplasty  Risks and benefits of Lumbar 1 kyphoplasty were discussed with the patient including, but not limited to education regarding the natural healing process of compression fractures without intervention, bleeding, infection, cement migration which may cause spinal cord damage, paralysis, pulmonary embolism or even death.  This interventional procedure involves the use of X-rays and because of the nature of the planned procedure, it is possible that we will have prolonged use of X-ray fluoroscopy.  Potential radiation risks to you include (but are not limited to) the following: - A slightly elevated risk for cancer  several years later in life. This risk is typically less than 0.5% percent. This risk is low in comparison to the normal incidence of human cancer, which is 33% for women and 50% for men according to the [place]. - Radiation induced injury can include skin redness, resembling a rash, tissue breakdown / ulcers and hair loss (which can be temporary or permanent).   The likelihood of either of these occurring depends on the difficulty of the procedure and whether you are sensitive to radiation due to previous procedures, disease, or genetic conditions.   IF your procedure requires a prolonged use of radiation, you will be notified and given written instructions for further action.  It is your responsibility to monitor the irradiated area for the 2 weeks following the procedure and to notify your physician if you are concerned that you have suffered a radiation induced injury.    All of the patient's questions were answered, patient is agreeable to proceed.  Consent signed and in chart.  Thank you for this interesting consult.  I greatly enjoyed meeting CASSEY HURRELL and look forward to participating in their care.  A copy of this report was sent to the requesting provider on this date.  Electronically Signed: Lavonia Drafts,  PA-C 12/19/2017, 10:37 AM   I spent a total of  30 Minutes   in face to face in clinical consultation, greater than 50% of which was counseling/coordinating care for L1 KP

## 2017-12-19 NOTE — Progress Notes (Signed)
Planned image-guided lumbar 1 kyphoplasty with Dr. Estanislado Pandy today. Patient accompanied by wife and 3 family members at bedside.  Informed patient that his procedure is canceled today due to CODE STROKE that requires Dr. Arlean Hopping attention at this time. Explained that Caryl Pina (scheduler) will call patient this afternoon to reschedule his procedure.  All questions answered and concerns addressed. Patient, wife, and family members convey understanding and agree with plan.  Bea Graff Louk, PA-C 12/19/2017, 12:03 PM

## 2017-12-20 NOTE — Telephone Encounter (Signed)
   Primary Cardiologist: Thompson Grayer, MD  Chart reviewed as part of pre-operative protocol coverage. Patient was contacted 12/20/2017 in reference to pre-operative risk assessment for pending surgery as outlined below.  [name] was last seen in June 2018 by Dr. Rayann Heman.  Since that day, [name] has done well.  He denies any chest pain or shortness of breath.  He states he will get a little short of breath if he overexerts himself, but otherwise does well.  He is able to climb stairs, but does go slowly.  His activity limitations are more related to his musculoskeletal issues then to any cardiac issues.  Therefore, based on ACC/AHA guidelines, the patient would be at acceptable risk for the planned procedure without further cardiovascular testing.   I will route this recommendation to the requesting party via Epic fax function and remove from pre-op pool.  Please call with questions.  Rosaria Ferries, PA-C 12/20/2017, 2:03 PM

## 2017-12-23 ENCOUNTER — Other Ambulatory Visit: Payer: Self-pay | Admitting: Radiology

## 2017-12-24 ENCOUNTER — Encounter (HOSPITAL_COMMUNITY): Payer: Self-pay

## 2017-12-24 ENCOUNTER — Ambulatory Visit (HOSPITAL_COMMUNITY)
Admission: RE | Admit: 2017-12-24 | Discharge: 2017-12-24 | Disposition: A | Discharge status: Home / Self Care | Payer: Medicare Other | Source: Ambulatory Visit | Attending: Interventional Radiology | Admitting: Interventional Radiology

## 2017-12-24 DIAGNOSIS — Z5309 Procedure and treatment not carried out because of other contraindication: Secondary | ICD-10-CM | POA: Diagnosis not present

## 2017-12-24 DIAGNOSIS — I481 Persistent atrial fibrillation: Secondary | ICD-10-CM | POA: Insufficient documentation

## 2017-12-24 DIAGNOSIS — M4856XA Collapsed vertebra, not elsewhere classified, lumbar region, initial encounter for fracture: Secondary | ICD-10-CM | POA: Insufficient documentation

## 2017-12-24 DIAGNOSIS — I110 Hypertensive heart disease with heart failure: Secondary | ICD-10-CM | POA: Diagnosis not present

## 2017-12-24 DIAGNOSIS — I509 Heart failure, unspecified: Secondary | ICD-10-CM | POA: Diagnosis not present

## 2017-12-24 LAB — BASIC METABOLIC PANEL
Anion gap: 9 (ref 5–15)
BUN: 12 mg/dL (ref 6–20)
CALCIUM: 9.1 mg/dL (ref 8.9–10.3)
CO2: 25 mmol/L (ref 22–32)
CREATININE: 0.87 mg/dL (ref 0.61–1.24)
Chloride: 105 mmol/L (ref 101–111)
GFR calc non Af Amer: >60 mL/min (ref >60)
Glucose, Bld: 105 mg/dL — ABNORMAL HIGH (ref 65–99)
Potassium: 4.3 mmol/L (ref 3.5–5.1)
Sodium: 139 mmol/L (ref 135–145)

## 2017-12-24 LAB — PROTIME-INR
INR: 0.97
Prothrombin Time: 12.8 seconds (ref 11.4–15.2)

## 2017-12-24 LAB — GLUCOSE, CAPILLARY: Glucose-Capillary: 88 mg/dL (ref 65–99)

## 2017-12-24 MED ORDER — SODIUM CHLORIDE 0.9 % IV SOLN
INTRAVENOUS | Status: DC
  Administered 2017-12-24: 11:00:00 via INTRAVENOUS

## 2017-12-24 MED ORDER — CEFAZOLIN SODIUM-DEXTROSE 2-4 GM/100ML-% IV SOLN: 2.0000 g | INTRAVENOUS | Status: DC

## 2017-12-24 NOTE — H&P (Signed)
Chief Complaint: Patient was seen in consultation today for lumbar 1 compression fracture.  Referring Physician(s): None  Supervising Physician: Luanne Bras  Patient Status: [name] - Out-pt  History of Present Illness: [name] is a 84 y.o. male  Hx Parkinson's disease with associated confusion, HF, atrial fibrillation, and macular degeneration.  Lumbar 1 compression fracture with no known injury.  CT 10/29/2017: 1. Progression of L1 compression fracture relative to 08/04/2017, now height loss of 50%. The chronicity of the fracture is otherwise indeterminate. 2. Moderate right and severe left neural foraminal stenosis at L5-S1.  Consulted with Dr. Estanislado Pandy 12/12/2017 for management of lumbar 1 compression fracture.  Patient presents today for image-guided lumbar 1 kyphoplasty. Procedure was rescheduled from 12/19/2017 due to code stroke that required Dr. Arlean Hopping immediate attention. Accompanied by wife at bedside. States that his back was in pain yesterday, rates his pain 3/10. Wife reports that patient gets confused and forgets his pain. Denies numbness/tingling down lower extremities or bowel/bladder incontinence.  Patient prescribed Eliquis for atrial fibrillation- last dose 12/21/2017.  Past Medical History:  Diagnosis Date  . CAD (coronary artery disease) 09/2008  . Cancer ([place])    basal cell carcinoma  . CHF (congestive heart failure) ([place])   . Complication of anesthesia    06/12/11 anesthesia made legs asleep also and went into chf and ended up in ICU after gallbladder surgery  . Diastolic dysfunction    hx of chf due to fluid overload 9/12   . Dysrhythmia    atrial fibrillation  . GERD (gastroesophageal reflux disease)   . History of blood product transfusion    d/t bleeding ulcer  . Hypertension   . Macular degeneration (senile) of retina   . Persistent atrial fibrillation ([place])   . PONV (postoperative nausea and vomiting)   . Presence of  permanent cardiac pacemaker 2010  . Shortness of breath dyspnea    exertion  . Sick sinus syndrome ([facility])    St. Jude  . Syncope    resolved s/p PPM    Past Surgical History:  Procedure Laterality Date  . CHOLECYSTECTOMY  06/12/11  . INGUINAL HERNIA REPAIR Left 10/06/2014   Procedure: OPEN REPAIR LEFT INGUINAL HERNIA ;  Surgeon: Fanny Skates, MD;  Location: [facility];  Service: General;  Laterality: Left;  . INSERTION OF MESH Left 10/06/2014   Procedure: INSERTION OF MESH;  Surgeon: Fanny Skates, MD;  Location: [facility];  Service: General;  Laterality: Left;  . IR RADIOLOGIST EVAL & MGMT  12/12/2017  . LAPAROSCOPIC CHOLECYSTECTOMY W/ CHOLANGIOGRAPHY  06/12/2011  . PACEMAKER INSERTION  09/28/08   [facility] XL DR  . perforated ulcer    . right inguinal hernia repair  11/19/2002   with mesh  . TRANSURETHRAL RESECTION OF BLADDER TUMOR  09/06/2011   Procedure: TRANSURETHRAL RESECTION OF BLADDER TUMOR (TURBT);  Surgeon: Ailene Rud, MD;  Location: [facility];  Service: Urology;  Laterality: N/A;  . TRANSURETHRAL RESECTION OF PROSTATE  24/26/8341  . umblical hernia  9/62/2297    Allergies: Hydrocodone and Hydroxyzine  Medications: Prior to Admission medications   Medication Sig Start Date End Date Taking? Authorizing Provider  carbidopa-levodopa (SINEMET) 25-100 MG tablet Take 1 tablet by mouth 3 (three) times daily. 06/14/17  Yes Varney Biles, MD  Cholecalciferol (VITAMIN D) 2000 units tablet Take 2,000 Units by mouth daily.    Yes [provider]  Magnesium 250 MG TABS Take 500 mg by mouth daily.    Yes [provider]  ranitidine (ZANTAC) 150 MG tablet Take 150 mg by mouth daily.   Yes [provider]  triamcinolone cream (KENALOG) 0.1 % Apply 1 application topically daily as needed (for irritation).  05/23/16  Yes [provider]  apixaban (ELIQUIS) 5 MG TABS tablet Take 1 tablet (5 mg total) by mouth 2 (two) times daily. 04/17/17   Thompson Grayer, MD      Family History  Problem Relation Age of Onset  . Heart attack Mother 36       MI  . Heart failure Father   . Heart attack Brother        Late 37s  . Cancer Sister     Social History   Socioeconomic History  . Marital status: Married    Spouse name: Not on file  . Number of children: Not on file  . Years of education: Not on file  . Highest education level: Not on file  Occupational History  . Not on file  Social Needs  . Financial resource strain: Not on file  . Food insecurity:    Worry: Not on file    Inability: Not on file  . Transportation needs:    Medical: Not on file    Non-medical: Not on file  Tobacco Use  . Smoking status: Former Smoker    Last attempt to quit: 09/17/1958    Years since quitting: 59.3  . Smokeless tobacco: Never Used  . Tobacco comment: quit 1968  Substance and Sexual Activity  . Alcohol use: No  . Drug use: No  . Sexual activity: Not on file  Lifestyle  . Physical activity:    Days per week: Not on file    Minutes per session: Not on file  . Stress: Not on file  Relationships  . Social connections:    Talks on phone: Not on file    Gets together: Not on file    Attends religious service: Not on file    Active member of club or organization: Not on file    Attends meetings of clubs or organizations: Not on file    Relationship status: Not on file  Other Topics Concern  . Not on file  Social History Narrative  . Not on file     Review of Systems: A 12 point ROS discussed and pertinent positives are indicated in the HPI above.  All other systems are negative.  Review of Systems  Constitutional: Negative for activity change and fever.  Respiratory: Negative for shortness of breath and wheezing.   Cardiovascular: Negative for chest pain and palpitations.  Gastrointestinal:       Negative for bowel incontinence.  Genitourinary:       Negative for bladder incontinence.  Neurological: Negative for weakness and numbness.    Psychiatric/Behavioral: Negative for behavioral problems.    Vital Signs: BP 126/82 (BP Location: Right Arm)   Pulse (!) 59   Temp (!) 97.5 F (36.4 C) (Oral)   Ht 5\' 9"  (1.753 m)   Wt 174 lb (78.9 kg)   SpO2 97%   BMI 25.70 kg/m   Physical Exam  Constitutional: He is oriented to person, place, and time. He appears well-developed and well-nourished.  Cardiovascular: Normal rate, regular rhythm and normal heart sounds.  No murmur heard. Pulmonary/Chest: Effort normal and breath sounds normal. He has no wheezes.  Musculoskeletal:  Minimal midline lower back pain.  Neurological: He is alert and oriented to person, place, and time.  Skin: Skin  is warm and dry.  Psychiatric: He has a normal mood and affect. His behavior is normal. Judgment and thought content normal.  Nursing note and vitals reviewed.    MD Evaluation Airway: WNL Heart: WNL Abdomen: WNL Chest/ Lungs: WNL ASA  Classification: 3 Mallampati/Airway Score: Two   Imaging: Ir Radiologist Eval & Mgmt  Result Date: 12/13/2017 EXAM: NEW PATIENT OFFICE VISIT CHIEF COMPLAINT: Lumbar 1 compression fracture Current Pain Level: 0 HISTORY OF PRESENT ILLNESS: Hx Lumbar 1 compression fracture with no known injury. CT abdomen/pelvis 08/01/2017: 1. Age indeterminate mild compression fracture of the L1 superior endplate, but suspicious for a subacute fracture and the symptomatic abnormality given absence of other findings on this study. If specific therapy such as vertebroplasty is desired Nuclear medicine Whole-body Bone Scan would best determine acuity(assuming non MRI compatible pacemaker). 2. No other acute or inflammatory findings. 3. Severe diverticulosis of the colon from the transverse to the sigmoid, but no active inflammation. 4. Aortic Atherosclerosis (ICD10-I70.0) and Emphysema (ICD10-J43.9). Areas of pulmonary fibrosis at the lung bases. Cardiomegaly. 5. Progressed fat containing supraumbilical midline ventral abdominal  hernia since 2015, now up to 5.3 cm diameter. CT lumbar spine 08/04/2017: 1. Known acute or subacute L1 superior endplate fracture with mild height loss that is stable from 3 days prior. No new abnormality. DG lumbar spine 08/25/2017: 1. Re- demonstration of L1 compression fracture with mild to moderate height loss, similar to slightly progressed given differences in imaging technique. 2. Osteopenia, decreasing sensitivity for acute nondisplaced fractures. No new fracture. No malalignment. CT thoracic/lumbar spine 10/29/2017: 1. Osteopenia with multilevel chronic height loss but no acute abnormality or spinal canal stenosis. 2. Progression of L1 compression fracture relative to 08/04/2017, now height loss of 50%. The chronicity of the fracture is otherwise indeterminate. 3. Moderate right and severe left neural foraminal stenosis at L5-S1. IR consulted by Dr. [name] for possible image-guided kyphoplasty for management of lumbar 1 compression fracture. Patient presents today with wife. States that he is in no pain at this time. Wife states that patient has history of Parkinson's disease and sometimes gets confused and does not remember saying he is in pain. Wife states that her husband was in pain when the fracture was first discovered in 07/2017, but his pain has improved since then. Denies numbness/tingling down legs or bladder/bowel incontinence. Patient is taking Eliquis 5mg  twice a day. His cardiologist is Dr. Thompson Grayer. Past Medical History: Hypertension, atrial fibrillation, high cholesterol, Parkinson's disease, macular degeneration, osteoporosis, GERD Past Surgical History: Perforated ulcer repair, umbilical hernia repair, right inguinal hernia repair, cholecystectomy, pacemaker insertion, transurethral resection of prostate/bladder tumor Medications: Eliquis, Sinemet, Pepcid, Magnesium, Vitamin-D, Vitamin B12 Allergies: Hydrocodone, Hydroxyzine Social History: Patient is married. He lives at home with  wife. He is current not employed. Denies alcohol, tobacco, recreational drugs, and caffeine use. Family History:  Cancer, heart problems REVIEW OF SYSTEMS: Constitutional: Denies fever activity change. Respiratory: Denies shortness of breath or wheezing. Cardiovascular: Denies chest pain or palpitations. GI: Denies bowel incontinence. GU: Denies bladder incontinence. Musculoskeletal: Denies back pain. Neurological: Denies weakness or numbness/tingling. PHYSICAL EXAMINATION: Constitutional: Well-developed well-nourished male in no acute distress. Cardiovascular normal rate, regular rhythm, no murmur. Pulmonary: Effort normal, breath sounds normal, no wheezes. Neurological: Alert oriented x3. Psychiatric: Mood, affect, behavior, thought content, and judgment normal. ASSESSMENT AND PLAN: Lumbar 1 compression fracture Reviewed imaging with patient and wife. Explained benefits and risks to procedure. Patient and wife wish to move forward with the procedure. Plan to schedule  image-guided lumbar 1 kyphoplasty ASAP. Will discuss procedure with patient's cardiologist, Dr. Thompson Grayer, as patient is taking Eliquis need to hold this medication for 48 hours prior to procedure. Instructed patient that if Dr. Rayann Heman approves Eliquis hold, patient is to stop taking Eliquis 48 hours prior to procedure. During these d48 hours, we will bridge this with Aspirin 81mg  once a day. All questions answered and concerns addressed. Patient and wife understand and agree with plan. Read by: Earley Abide, PA-C Electronically Signed   By: Luanne Bras M.D.   On: 12/12/2017 15:43    Labs:  CBC: Recent Labs    06/14/17 0900 06/14/17 0911 08/01/17 1314 08/25/17 1737 12/19/17 1038  WBC 5.8  --  10.3 13.5* 7.3  HGB 14.0 15.6 15.3 15.8 15.1  HCT 44.0 46.0 45.4 47.0 46.8  PLT 192  --  224 242 246    COAGS: Recent Labs    06/14/17 0900 12/19/17 1038  INR 1.22 1.03  APTT 25  --     BMP: Recent Labs    06/14/17 0900  06/14/17 0911 08/01/17 1314 08/25/17 1737 12/19/17 1038  NA 138 140 138 140 141  K 3.6 3.5 4.6 4.2 4.2  CL 107 103 104 107 107  CO2 25  --  26 24 25   GLUCOSE 117* 115* 119* 133* 108*  BUN 12 15 17 10 14   CALCIUM 8.7*  --  9.0 9.1 8.9  CREATININE 0.85 0.80 0.92 0.92 0.90  GFRNONAA >60  --  >60 >60 >60  GFRAA >60  --  >60 >60 >60    LIVER FUNCTION TESTS: Recent Labs    06/14/17 0900 08/01/17 1314 08/25/17 1737  BILITOT 0.7 0.8 1.0  AST 19 21 25   ALT 16* 7* 25  ALKPHOS 76 101 133*  PROT 6.8 7.4 7.2  ALBUMIN 3.5 3.6 3.5    TUMOR MARKERS: No results for input(s): AFPTM, CEA, CA199, CHROMGRNA in the last 8760 hours.  Assessment and Plan:  Lumbar 1 compression fracture. Plan for image-guided lumbar 1 kyphoplasty today with Dr. Estanislado Pandy. Patient is NPO. Last Eliquis dose 12/21/2017. Consent signed by patient's wife- patient has macular degeneration and cannot see.  Risks and benefits of lumbar 1 kyphoplasty were discussed with the patient including, but not limited to education regarding the natural healing process of compression fractures without intervention, bleeding, infection, cement migration which may cause spinal cord damage, paralysis, pulmonary embolism or even death. This interventional procedure involves the use of X-rays and because of the nature of the planned procedure, it is possible that we will have prolonged use of X-ray fluoroscopy. Potential radiation risks to you include (but are not limited to) the following: - A slightly elevated risk for cancer  several years later in life. This risk is typically less than 0.5% percent. This risk is low in comparison to the normal incidence of human cancer, which is 33% for women and 50% for men according to the [employer]. - Radiation induced injury can include skin redness, resembling a rash, tissue breakdown / ulcers and hair loss (which can be temporary or permanent).  The likelihood of either of  these occurring depends on the difficulty of the procedure and whether you are sensitive to radiation due to previous procedures, disease, or genetic conditions.  IF your procedure requires a prolonged use of radiation, you will be notified and given written instructions for further action.  It is your responsibility to monitor the irradiated area for the 2 weeks  following the procedure and to notify your physician if you are concerned that you have suffered a radiation induced injury.   All of the patient's questions were answered, patient is agreeable to proceed. Consent signed and in chart.  Thank you for this interesting consult.  I greatly enjoyed meeting [name] and look forward to participating in their care.  A copy of this report was sent to the requesting provider on this date.  Electronically Signed: Earley Abide, PA-C 12/24/2017, 10:44 AM   I spent a total of 30 minutes in face to face in clinical consultation, greater than 50% of which was counseling/coordinating care for lumbar 1 compression fracture.

## 2017-12-24 NOTE — Progress Notes (Signed)
Planned image-guided lumbar 1 kyphoplasty with Dr. Estanislado Pandy today. Patient accompanied by wife and 3 family members at bedside.  Informed patient that his procedure is canceled today due to CODE STROKE that requires Dr. Arlean Hopping attention at this time. Explained that one of our schedulers will call patient today or tomorrow to reschedule his procedure.  All questions answered and concerns addressed. Patient, wife, and family members convey understanding and agree with plan.  Bea Graff Ottis Vacha, PA-C 12/24/2017, 1:55 PM

## 2017-12-25 ENCOUNTER — Other Ambulatory Visit: Payer: Self-pay | Admitting: Radiology

## 2017-12-26 ENCOUNTER — Ambulatory Visit (HOSPITAL_COMMUNITY)
Admission: RE | Admit: 2017-12-26 | Discharge: 2017-12-26 | Disposition: A | Discharge status: Home / Self Care | Payer: Medicare Other | Source: Ambulatory Visit | Attending: Interventional Radiology | Admitting: Interventional Radiology

## 2017-12-26 ENCOUNTER — Encounter (HOSPITAL_COMMUNITY): Payer: Self-pay

## 2017-12-26 DIAGNOSIS — Z87891 Personal history of nicotine dependence: Secondary | ICD-10-CM | POA: Diagnosis not present

## 2017-12-26 DIAGNOSIS — I481 Persistent atrial fibrillation: Secondary | ICD-10-CM | POA: Insufficient documentation

## 2017-12-26 DIAGNOSIS — Z8249 Family history of ischemic heart disease and other diseases of the circulatory system: Secondary | ICD-10-CM | POA: Insufficient documentation

## 2017-12-26 DIAGNOSIS — I2510 Atherosclerotic heart disease of native coronary artery without angina pectoris: Secondary | ICD-10-CM | POA: Insufficient documentation

## 2017-12-26 DIAGNOSIS — I509 Heart failure, unspecified: Secondary | ICD-10-CM | POA: Diagnosis not present

## 2017-12-26 DIAGNOSIS — H3530 Unspecified macular degeneration: Secondary | ICD-10-CM | POA: Diagnosis not present

## 2017-12-26 DIAGNOSIS — G20 Parkinson's disease: Secondary | ICD-10-CM | POA: Insufficient documentation

## 2017-12-26 DIAGNOSIS — Z950 Presence of cardiac pacemaker: Secondary | ICD-10-CM | POA: Insufficient documentation

## 2017-12-26 DIAGNOSIS — M4856XA Collapsed vertebra, not elsewhere classified, lumbar region, initial encounter for fracture: Secondary | ICD-10-CM | POA: Insufficient documentation

## 2017-12-26 DIAGNOSIS — K219 Gastro-esophageal reflux disease without esophagitis: Secondary | ICD-10-CM | POA: Insufficient documentation

## 2017-12-26 DIAGNOSIS — I110 Hypertensive heart disease with heart failure: Secondary | ICD-10-CM | POA: Diagnosis not present

## 2017-12-26 HISTORY — PX: IR KYPHO LUMBAR INC FX REDUCE BONE BX UNI/BIL CANNULATION INC/IMAGING: IMG5519

## 2017-12-26 MED ORDER — MIDAZOLAM HCL 2 MG/2ML IJ SOLN
INTRAMUSCULAR | Status: DC | PRN
  Administered 2017-12-26: 1 mg via INTRAVENOUS
  Administered 2017-12-26: 0.5 mg via INTRAVENOUS

## 2017-12-26 MED ORDER — CEFAZOLIN SODIUM-DEXTROSE 2-4 GM/100ML-% IV SOLN
INTRAVENOUS | Status: AC
  Filled 2017-12-26: qty 100

## 2017-12-26 MED ORDER — CEFAZOLIN SODIUM-DEXTROSE 2-4 GM/100ML-% IV SOLN
2.0000 g | INTRAVENOUS | Status: AC
  Administered 2017-12-26: 2 g via INTRAVENOUS

## 2017-12-26 MED ORDER — SODIUM CHLORIDE 0.9 % IV SOLN: INTRAVENOUS | Status: DC

## 2017-12-26 MED ORDER — MIDAZOLAM HCL 2 MG/2ML IJ SOLN
INTRAMUSCULAR | Status: AC
  Filled 2017-12-26: qty 2

## 2017-12-26 MED ORDER — POTASSIUM CHLORIDE IN NACL 20-0.9 MEQ/L-% IV SOLN
INTRAVENOUS | Status: DC
  Filled 2017-12-26: qty 1000

## 2017-12-26 MED ORDER — FENTANYL CITRATE (PF) 100 MCG/2ML IJ SOLN
INTRAMUSCULAR | Status: DC | PRN
  Administered 2017-12-26 (×2): 25 ug via INTRAVENOUS

## 2017-12-26 MED ORDER — BUPIVACAINE HCL 0.25 % IJ SOLN
INTRAMUSCULAR | Status: DC | PRN
  Administered 2017-12-26: 15 mL

## 2017-12-26 MED ORDER — FENTANYL CITRATE (PF) 100 MCG/2ML IJ SOLN
INTRAMUSCULAR | Status: AC
  Filled 2017-12-26: qty 2

## 2017-12-26 MED ORDER — IOPAMIDOL (ISOVUE-300) INJECTION 61%
INTRAVENOUS | Status: AC
  Administered 2017-12-26: 1 mL
  Filled 2017-12-26: qty 50

## 2017-12-26 MED ORDER — TOBRAMYCIN SULFATE 1.2 G IJ SOLR
INTRAMUSCULAR | Status: AC
  Filled 2017-12-26: qty 1.2

## 2017-12-26 MED ORDER — BUPIVACAINE HCL (PF) 0.5 % IJ SOLN
INTRAMUSCULAR | Status: AC
  Filled 2017-12-26: qty 30

## 2017-12-26 NOTE — Sedation Documentation (Signed)
Family at bedside. 

## 2017-12-26 NOTE — Sedation Documentation (Signed)
Patient is resting comfortably. 

## 2017-12-26 NOTE — Discharge Instructions (Signed)
KYPHOPLASTY/VERTEBROPLASTY DISCHARGE INSTRUCTIONS  Medications: (check all that apply)     Resume all home medications as before procedure.       Resume your Eliquis per Dr. Arlean Hopping instructions.       Continue your pain medications as prescribed as needed.  Over the next 3-5 days, decrease your pain medication as tolerated.  Over the counter medications (i.e. Tylenol, ibuprofen, and aleve) may be substituted once severe/moderate pain symptoms have subsided.   Wound Care: - Bandages may be removed the day following your procedure.  You may get your incision wet once bandages are removed.  Bandaids may be used to cover the incisions until scab formation.  Topical ointments are optional.  - If you develop a fever greater than 101 degrees, have increased skin redness at the incision sites or pus-like oozing from incisions occurring within 1 week of the procedure, contact radiology at [phone] or [phone].  - Ice pack to back for 15-20 minutes 2-3 time per day for first 2-3 days post procedure.  The ice will expedite muscle healing and help with the pain from the incisions.   Activity: - Bedrest today with limited activity for 24 hours post procedure.  - No driving for 48 hours.  - Increase your activity as tolerated after bedrest (with assistance if necessary).  - Refrain from any strenuous activity or heavy lifting (greater than 10 lbs.).   Follow up: - Contact radiology at [phone] or [phone] if any questions/concerns.  - A physician assistant from radiology will contact you in approximately 1 week.  - If a biopsy was performed at the time of your procedure, your referring physician should receive the results in usually 2-3 days.

## 2017-12-26 NOTE — H&P (Signed)
Chief Complaint: Patient was seen in consultation today for lumbar 1 compression fracture.  Referring Physician(s): None  Supervising Physician: Luanne Bras  Patient Status: [facility] - Out-pt  History of Present Illness: [name] is a 84 y.o. male  Hx Parkinson's disease with associated confusion, HF, atrial fibrillation, and macular degeneration.  Hx lumbar 1 compression fracture with no known injury.  CT 10/29/2017: 1. Progression of L1 compression fracture relative to 08/04/2017, now height loss of 50%. The chronicity of the fracture is otherwise indeterminate. 2. Moderate right and severe left neural foraminal stenosis at L5-S1.  Consulted with Dr. Estanislado Pandy 12/12/2017 for management of lumbar 1 compression fracture.  Patient presents today for image-guided lumbar 1 kyphoplasty. Procedure was rescheduled from 12/24/2017 due to code stroke that required Dr. Arlean Hopping immediate attention. Accompanied by wife at bedside. Denies back pain at this time, wife reports that patient gets confused and forgets his pain. Denies numbness/tingling down lower extremities or bowel/bladder incontinence.  Patient prescribed Eliquis for atrial fibrillation- last dose 12/21/2017.  Past Medical History:  Diagnosis Date  . CAD (coronary artery disease) 09/2008  . Cancer ([place])    basal cell carcinoma  . CHF (congestive heart failure) ([place])   . Complication of anesthesia    06/12/11 anesthesia made legs asleep also and went into chf and ended up in ICU after gallbladder surgery  . Diastolic dysfunction    hx of chf due to fluid overload 9/12   . Dysrhythmia    atrial fibrillation  . GERD (gastroesophageal reflux disease)   . History of blood product transfusion    d/t bleeding ulcer  . Hypertension   . Macular degeneration (senile) of retina   . Persistent atrial fibrillation ([place])   . PONV (postoperative nausea and vomiting)   . Presence of permanent cardiac pacemaker 2010    . Shortness of breath dyspnea    exertion  . Sick sinus syndrome (HCC)    St. Jude  . Syncope    resolved s/p PPM    Past Surgical History:  Procedure Laterality Date  . CHOLECYSTECTOMY  06/12/11  . INGUINAL HERNIA REPAIR Left 10/06/2014   Procedure: OPEN REPAIR LEFT INGUINAL HERNIA ;  Surgeon: Fanny Skates, MD;  Location: [place];  Service: General;  Laterality: Left;  . INSERTION OF MESH Left 10/06/2014   Procedure: INSERTION OF MESH;  Surgeon: Fanny Skates, MD;  Location: [place];  Service: General;  Laterality: Left;  . IR RADIOLOGIST EVAL & MGMT  12/12/2017  . LAPAROSCOPIC CHOLECYSTECTOMY W/ CHOLANGIOGRAPHY  06/12/2011  . PACEMAKER INSERTION  09/28/08   SJM Zephyr XL DR  . perforated ulcer    . right inguinal hernia repair  11/19/2002   with mesh  . TRANSURETHRAL RESECTION OF BLADDER TUMOR  09/06/2011   Procedure: TRANSURETHRAL RESECTION OF BLADDER TUMOR (TURBT);  Surgeon: Ailene Rud, MD;  Location[facility];  Service: Urology;  Laterality: N/A;  . TRANSURETHRAL RESECTION OF PROSTATE  23/53/6144  . umblical hernia  11/30/4006    Allergies: Hydrocodone and Hydroxyzine  Medications: Prior to Admission medications   Medication Sig Start Date End Date Taking? Authorizing Provider  carbidopa-levodopa (SINEMET) 25-100 MG tablet Take 1 tablet by mouth 3 (three) times daily. 06/14/17  Yes Varney Biles, MD  Cholecalciferol (VITAMIN D) 2000 units tablet Take 2,000 Units by mouth daily.    Yes [provider]  Magnesium 250 MG TABS Take 500 mg by mouth daily.    Yes [provider]  ranitidine (  ZANTAC) 150 MG tablet Take 150 mg by mouth daily.   Yes [provider]  triamcinolone cream (KENALOG) 0.1 % Apply 1 application topically daily as needed (for irritation).  05/23/16  Yes [provider]  apixaban (ELIQUIS) 5 MG TABS tablet Take 1 tablet (5 mg total) by mouth 2 (two) times daily. 04/17/17   Thompson Grayer, MD     Family History  Problem  Relation Age of Onset  . Heart attack Mother 33       MI  . Heart failure Father   . Heart attack Brother        Late 52s  . Cancer Sister     Social History   Socioeconomic History  . Marital status: Married    Spouse name: Not on file  . Number of children: Not on file  . Years of education: Not on file  . Highest education level: Not on file  Occupational History  . Not on file  Social Needs  . Financial resource strain: Not on file  . Food insecurity:    Worry: Not on file    Inability: Not on file  . Transportation needs:    Medical: Not on file    Non-medical: Not on file  Tobacco Use  . Smoking status: Former Smoker    Last attempt to quit: 09/17/1958    Years since quitting: 59.3  . Smokeless tobacco: Never Used  . Tobacco comment: quit 1968  Substance and Sexual Activity  . Alcohol use: No  . Drug use: No  . Sexual activity: Not on file  Lifestyle  . Physical activity:    Days per week: Not on file    Minutes per session: Not on file  . Stress: Not on file  Relationships  . Social connections:    Talks on phone: Not on file    Gets together: Not on file    Attends religious service: Not on file    Active member of club or organization: Not on file    Attends meetings of clubs or organizations: Not on file    Relationship status: Not on file  Other Topics Concern  . Not on file  Social History Narrative  . Not on file     Review of Systems: A 12 point ROS discussed and pertinent positives are indicated in the HPI above.  All other systems are negative.  Review of Systems  Constitutional: Negative for activity change and fever.  Respiratory: Negative for shortness of breath and wheezing.   Cardiovascular: Negative for chest pain and palpitations.  Gastrointestinal:       Negative for bowel incontinence.  Genitourinary:       Negative for bladder incontinence.  Musculoskeletal: Positive for back pain.  Neurological: Negative for weakness and  numbness.  Psychiatric/Behavioral: Negative for behavioral problems.    Vital Signs: BP 131/85   Pulse (!) 56   Temp 98.1 F (36.7 C) (Oral)   Resp 18   Ht 5' 9.5" (1.765 m)   Wt 172 lb (78 kg)   SpO2 94%   BMI 25.04 kg/m   Physical Exam  Constitutional: He is oriented to person, place, and time. He appears well-developed and well-nourished. No distress.  Cardiovascular: Normal rate, regular rhythm, normal heart sounds and intact distal pulses.  No murmur heard. Pulmonary/Chest: Effort normal and breath sounds normal. He has no wheezes.  Musculoskeletal:  Minimal midline lower back pain.  Neurological: He is alert and oriented to person, place,  and time.  Skin: Skin is warm and dry.  Psychiatric: He has a normal mood and affect. His behavior is normal. Judgment and thought content normal.  Nursing note and vitals reviewed.    MD Evaluation Airway: WNL Heart: WNL Abdomen: WNL Chest/ Lungs: WNL ASA  Classification: 3 Mallampati/Airway Score: Two   Imaging: Ir Radiologist Eval & Mgmt  Result Date: 12/13/2017 EXAM: NEW PATIENT OFFICE VISIT CHIEF COMPLAINT: Lumbar 1 compression fracture Current Pain Level: 0 HISTORY OF PRESENT ILLNESS: Hx Lumbar 1 compression fracture with no known injury. CT abdomen/pelvis 08/01/2017: 1. Age indeterminate mild compression fracture of the L1 superior endplate, but suspicious for a subacute fracture and the symptomatic abnormality given absence of other findings on this study. If specific therapy such as vertebroplasty is desired Nuclear medicine Whole-body Bone Scan would best determine acuity(assuming non MRI compatible pacemaker). 2. No other acute or inflammatory findings. 3. Severe diverticulosis of the colon from the transverse to the sigmoid, but no active inflammation. 4. Aortic Atherosclerosis (ICD10-I70.0) and Emphysema (ICD10-J43.9). Areas of pulmonary fibrosis at the lung bases. Cardiomegaly. 5. Progressed fat containing supraumbilical  midline ventral abdominal hernia since 2015, now up to 5.3 cm diameter. CT lumbar spine 08/04/2017: 1. Known acute or subacute L1 superior endplate fracture with mild height loss that is stable from 3 days prior. No new abnormality. DG lumbar spine 08/25/2017: 1. Re- demonstration of L1 compression fracture with mild to moderate height loss, similar to slightly progressed given differences in imaging technique. 2. Osteopenia, decreasing sensitivity for acute nondisplaced fractures. No new fracture. No malalignment. CT thoracic/lumbar spine 10/29/2017: 1. Osteopenia with multilevel chronic height loss but no acute abnormality or spinal canal stenosis. 2. Progression of L1 compression fracture relative to 08/04/2017, now height loss of 50%. The chronicity of the fracture is otherwise indeterminate. 3. Moderate right and severe left neural foraminal stenosis at L5-S1. IR consulted by Dr. [name] for possible image-guided kyphoplasty for management of lumbar 1 compression fracture. Patient presents today with wife. States that he is in no pain at this time. Wife states that patient has history of Parkinson's disease and sometimes gets confused and does not remember saying he is in pain. Wife states that her husband was in pain when the fracture was first discovered in 07/2017, but his pain has improved since then. Denies numbness/tingling down legs or bladder/bowel incontinence. Patient is taking Eliquis 5mg  twice a day. His cardiologist is Dr. Thompson Grayer. Past Medical History: Hypertension, atrial fibrillation, high cholesterol, Parkinson's disease, macular degeneration, osteoporosis, GERD Past Surgical History: Perforated ulcer repair, umbilical hernia repair, right inguinal hernia repair, cholecystectomy, pacemaker insertion, transurethral resection of prostate/bladder tumor Medications: Eliquis, Sinemet, Pepcid, Magnesium, Vitamin-D, Vitamin B12 Allergies: Hydrocodone, Hydroxyzine Social History: Patient is  married. He lives at home with wife. He is current not employed. Denies alcohol, tobacco, recreational drugs, and caffeine use. Family History:  Cancer, heart problems REVIEW OF SYSTEMS: Constitutional: Denies fever activity change. Respiratory: Denies shortness of breath or wheezing. Cardiovascular: Denies chest pain or palpitations. GI: Denies bowel incontinence. GU: Denies bladder incontinence. Musculoskeletal: Denies back pain. Neurological: Denies weakness or numbness/tingling. PHYSICAL EXAMINATION: Constitutional: Well-developed well-nourished male in no acute distress. Cardiovascular normal rate, regular rhythm, no murmur. Pulmonary: Effort normal, breath sounds normal, no wheezes. Neurological: Alert oriented x3. Psychiatric: Mood, affect, behavior, thought content, and judgment normal. ASSESSMENT AND PLAN: Lumbar 1 compression fracture Reviewed imaging with patient and wife. Explained benefits and risks to procedure. Patient and wife wish to move forward with  the procedure. Plan to schedule image-guided lumbar 1 kyphoplasty ASAP. Will discuss procedure with patient's cardiologist, Dr. Thompson Grayer, as patient is taking Eliquis need to hold this medication for 48 hours prior to procedure. Instructed patient that if Dr. Rayann Heman approves Eliquis hold, patient is to stop taking Eliquis 48 hours prior to procedure. During these d48 hours, we will bridge this with Aspirin 81mg  once a day. All questions answered and concerns addressed. Patient and wife understand and agree with plan. Read by: Earley Abide, PA-C Electronically Signed   By: Luanne Bras M.D.   On: 12/12/2017 15:43    Labs:  CBC: Recent Labs    06/14/17 0900 06/14/17 0911 08/01/17 1314 08/25/17 1737 12/19/17 1038  WBC 5.8  --  10.3 13.5* 7.3  HGB 14.0 15.6 15.3 15.8 15.1  HCT 44.0 46.0 45.4 47.0 46.8  PLT 192  --  224 242 246    COAGS: Recent Labs    06/14/17 0900 12/19/17 1038 12/24/17 1014  INR 1.22 1.03 0.97  APTT  25  --   --     BMP: Recent Labs    08/01/17 1314 08/25/17 1737 12/19/17 1038 12/24/17 1014  NA 138 140 141 139  K 4.6 4.2 4.2 4.3  CL 104 107 107 105  CO2 26 24 25 25   GLUCOSE 119* 133* 108* 105*  BUN 17 10 14 12   CALCIUM 9.0 9.1 8.9 9.1  CREATININE 0.92 0.92 0.90 0.87  GFRNONAA >60 >60 >60 >60  GFRAA >60 >60 >60 >60    LIVER FUNCTION TESTS: Recent Labs    06/14/17 0900 08/01/17 1314 08/25/17 1737  BILITOT 0.7 0.8 1.0  AST 19 21 25   ALT 16* 7* 25  ALKPHOS 76 101 133*  PROT 6.8 7.4 7.2  ALBUMIN 3.5 3.6 3.5    TUMOR MARKERS: No results for input(s): AFPTM, CEA, CA199, CHROMGRNA in the last 8760 hours.  Assessment and Plan:  Lumbar 1 compression fracture. Plan for image-guided lumbar 1 kyphoplasty today with Dr. Estanislado Pandy. Patient is NPO. Denies fever and WBCs WNL. INR 0.97 seconds 12/24/2017. Cr 0.87 mg/dL 12/24/2017. Last Eliquis dose 12/21/2017. Consent signed by patient's wife- patient has macular degeneration and cannot see.  Risks and benefits of lumbar 1 kyphoplasty were discussed with the patient including, but not limited to education regarding the natural healing process of compression fractures without intervention, bleeding, infection, cement migration which may cause spinal cord damage, paralysis, pulmonary embolism or even death. This interventional procedure involves the use of X-rays and because of the nature of the planned procedure, it is possible that we will have prolonged use of X-ray fluoroscopy. Potential radiation risks to you include (but are not limited to) the following: - A slightly elevated risk for cancer                    several years later in life. This risk is typically less than 0.5% percent. This risk is low in comparison to the normal incidence of human cancer, which is 33% for women and 50% for men according to the [place]. - Radiation induced injury can include skin redness, resembling a rash, tissue breakdown /  ulcers and hair loss (which can be temporary or permanent).  The likelihood of either of these occurring depends on the difficulty of the procedure and whether you are sensitive to radiation due to previous procedures, disease, or genetic conditions.  IF your procedure requires a prolonged use of radiation, you will be  notified and given written instructions for further action.  It is your responsibility to monitor the irradiated area for the 2 weeks following the procedure and to notify your physician if you are concerned that you have suffered a radiation induced injury.   All of the patient's questions were answered, patient is agreeable to proceed. Consent signed and in chart.  Thank you for this interesting consult.  I greatly enjoyed meeting [name] and look forward to participating in their care.  A copy of this report was sent to the requesting provider on this date.  Electronically Signed: Earley Abide, PA-C 12/26/2017, 10:48 AM   I spent a total of 30 minutes in face to face in clinical consultation, greater than 50% of which was counseling/coordinating care for lumbar 1 compression fracture.

## 2017-12-26 NOTE — Procedures (Signed)
S/P L1 balloon KP 

## 2017-12-27 ENCOUNTER — Encounter (HOSPITAL_COMMUNITY): Payer: Self-pay | Admitting: Interventional Radiology

## 2018-01-06 ENCOUNTER — Encounter: Payer: Self-pay | Admitting: Internal Medicine

## 2018-01-06 DIAGNOSIS — R001 Bradycardia, unspecified: Secondary | ICD-10-CM | POA: Diagnosis not present

## 2018-01-07 ENCOUNTER — Telehealth (HOSPITAL_COMMUNITY): Payer: Self-pay

## 2018-01-07 NOTE — Telephone Encounter (Signed)
Pt is improving since procedure. Wife will give [place] a call if things change to schedule a f/u. AW

## 2018-04-07 ENCOUNTER — Encounter: Payer: Self-pay | Admitting: Internal Medicine

## 2018-04-07 DIAGNOSIS — R001 Bradycardia, unspecified: Secondary | ICD-10-CM | POA: Diagnosis not present

## 2018-04-15 ENCOUNTER — Other Ambulatory Visit: Payer: Self-pay | Admitting: Internal Medicine

## 2018-04-15 NOTE — Telephone Encounter (Signed)
Pt last saw Dr Rayann Heman 02/27/17, has upcoming appt with Chanetta Marshall, NP on 04/18/18.  Last labs 12/24/17 Creat 0.87, age 84, weight 79.1kg, based on specified criteria pt is on appropriate dosage of Eliquis 5mg  BID.  Will refill rx.

## 2018-04-17 NOTE — Progress Notes (Signed)
Electrophysiology Office Note Date: 04/18/2018  ID:  [name], Ryan 06-22-1931, MRN [id number]  PCP: [name], MD Electrophysiologist: Rayann Heman  CC: Pacemaker follow-up  [name] is a 84 y.o. male seen today for Dr Rayann Heman.  He presents today for routine electrophysiology followup.  Since last being seen in our clinic, the patient reports doing very well.  He denies chest pain, palpitations, dyspnea, PND, orthopnea, nausea, vomiting, dizziness, syncope, edema, weight gain, or early satiety.  Device History: STJ dual chamber PPM implanted 2010 for symptomatic bradycardia   Past Medical History:  Diagnosis Date  . CAD (coronary artery disease) 09/2008  . Cancer ([place])    basal cell carcinoma  . CHF (congestive heart failure) ([place])   . Complication of anesthesia    06/12/11 anesthesia made legs asleep also and went into chf and ended up in ICU after gallbladder surgery  . Diastolic dysfunction    hx of chf due to fluid overload 9/12   . Dysrhythmia    atrial fibrillation  . GERD (gastroesophageal reflux disease)   . History of blood product transfusion    d/t bleeding ulcer  . Hypertension   . Macular degeneration (senile) of retina   . Persistent atrial fibrillation ([place])   . PONV (postoperative nausea and vomiting)   . Presence of permanent cardiac pacemaker 2010  . Shortness of breath dyspnea    exertion  . Sick sinus syndrome ([facility])    St. Jude  . Syncope    resolved s/p PPM   Past Surgical History:  Procedure Laterality Date  . CHOLECYSTECTOMY  06/12/11  . INGUINAL HERNIA REPAIR Left 10/06/2014   Procedure: OPEN REPAIR LEFT INGUINAL HERNIA ;  Surgeon: Fanny Skates, MD;  Location: [place];  Service: General;  Laterality: Left;  . INSERTION OF MESH Left 10/06/2014   Procedure: INSERTION OF MESH;  Surgeon: Fanny Skates, MD;  Location: [place];  Service: General;  Laterality: Left;  . IR KYPHO LUMBAR INC FX REDUCE BONE BX UNI/BIL CANNULATION INC/IMAGING   12/26/2017  . IR RADIOLOGIST EVAL & MGMT  12/12/2017  . LAPAROSCOPIC CHOLECYSTECTOMY W/ CHOLANGIOGRAPHY  06/12/2011  . PACEMAKER INSERTION  09/28/08   SJM Zephyr XL DR  . perforated ulcer    . right inguinal hernia repair  11/19/2002   with mesh  . TRANSURETHRAL RESECTION OF BLADDER TUMOR  09/06/2011   Procedure: TRANSURETHRAL RESECTION OF BLADDER TUMOR (TURBT);  Surgeon: Ailene Rud, MD;  [facility];  Service: Urology;  Laterality: N/A;  . TRANSURETHRAL RESECTION OF PROSTATE  35/45/6256  . umblical hernia  3/89/3734    Current Outpatient Medications  Medication Sig Dispense Refill  . carbidopa-levodopa (SINEMET) 25-100 MG tablet Take 1 tablet by mouth 3 (three) times daily. 30 tablet 0  . Cholecalciferol (VITAMIN D) 2000 units tablet Take 2,000 Units by mouth daily.     [name] ELIQUIS 5 MG TABS tablet TAKE 1 TABLET TWICE A DAY 180 tablet 1  . Magnesium 250 MG TABS Take 500 mg by mouth daily.     . ranitidine (ZANTAC) 150 MG tablet Take 150 mg by mouth daily.    [name] triamcinolone cream (KENALOG) 0.1 % Apply 1 application topically daily as needed (for irritation).      No current facility-administered medications for this visit.     Allergies:   Hydrocodone and Hydroxyzine   Social History: Social History   Socioeconomic History  . Marital status: Married    Spouse name: Not on  file  . Number of children: Not on file  . Years of education: Not on file  . Highest education level: Not on file  Occupational History  . Not on file  Social Needs  . Financial resource strain: Not on file  . Food insecurity:    Worry: Not on file    Inability: Not on file  . Transportation needs:    Medical: Not on file    Non-medical: Not on file  Tobacco Use  . Smoking status: Former Smoker    Last attempt to quit: 09/17/1958    Years since quitting: 59.6  . Smokeless tobacco: Never Used  . Tobacco comment: quit 1968  Substance and Sexual Activity  . Alcohol use: No  . Drug use: No   . Sexual activity: Not on file  Lifestyle  . Physical activity:    Days per week: Not on file    Minutes per session: Not on file  . Stress: Not on file  Relationships  . Social connections:    Talks on phone: Not on file    Gets together: Not on file    Attends religious service: Not on file    Active member of club or organization: Not on file    Attends meetings of clubs or organizations: Not on file    Relationship status: Not on file  . Intimate partner violence:    Fear of current or ex partner: Not on file    Emotionally abused: Not on file    Physically abused: Not on file    Forced sexual activity: Not on file  Other Topics Concern  . Not on file  Social History Narrative  . Not on file    Family History: Family History  Problem Relation Age of Onset  . Heart attack Mother 31       MI  . Heart failure Father   . Heart attack Brother        Late 19s  . Cancer Sister      Review of Systems: All other systems reviewed and are otherwise negative except as noted above.   Physical Exam: VS:  BP 104/64   Ht 5\' 8"  (1.727 m)   Wt 169 lb (76.7 kg)   BMI 25.70 kg/m  , BMI Body mass index is 25.7 kg/m.  GEN- The patient is elderly appearing, alert and oriented x 3 today, walks with walker.   HEENT: normocephalic, atraumatic; sclera clear, conjunctiva pink; hearing intact; oropharynx clear; neck supple  Lungs- Clear to ausculation bilaterally, normal work of breathing.  No wheezes, rales, rhonchi Heart- Regular rate and rhythm (paced) GI- soft, non-tender, non-distended, bowel sounds present  Extremities- no clubbing, cyanosis, or edema  MS- no significant deformity or atrophy Skin- warm and dry, no rash or lesion; PPM pocket well healed Psych- euthymic mood, full affect Neuro- strength and sensation are intact  PPM Interrogation- reviewed in detail today,  See PACEART report  EKG:  EKG is not ordered today.  Recent Labs: 08/25/2017: ALT 25 12/19/2017:  Hemoglobin 15.1; Platelets 246 12/24/2017: BUN 12; Creatinine, Ser 0.87; Potassium 4.3; Sodium 139   Wt Readings from Last 3 Encounters:  04/18/18 169 lb (76.7 kg)  12/26/17 172 lb (78 kg)  12/24/17 174 lb (78.9 kg)     Other studies Reviewed: Additional studies/ records that were reviewed today include: Dr Jackalyn Lombard office notes  Assessment and Plan:  1.  Symptomatic bradycardia Normal PPM function See Pace Art report No changes today  Estimated longevity 9 months. Follow up with device clinic in 6 months, continue TTM's.   2.  Permanent atrial fibrillation V rates controlled Continue Eliqius for CHADS2VASC of 4  3.  HTN Stable No change required today  Current medicines are reviewed at length with the patient today.   The patient does not have concerns regarding his medicines.  The following changes were made today:  none  Labs/ tests ordered today include: none Orders Placed This Encounter  Procedures  . CUP PACEART INCLINIC DEVICE CHECK     Disposition:   Follow up with TTM's, me in 1 year    Signed, Chanetta Marshall, NP 04/18/2018 11:21 AM  [facility] [facility] [address] [address] [phone] (office) [phone] (fax)

## 2018-04-18 ENCOUNTER — Ambulatory Visit (INDEPENDENT_AMBULATORY_CARE_PROVIDER_SITE_OTHER): Payer: Medicare Other | Admitting: Nurse Practitioner

## 2018-04-18 VITALS — BP 104/64 | Ht 68.0 in | Wt 169.0 lb

## 2018-04-18 DIAGNOSIS — I482 Chronic atrial fibrillation: Secondary | ICD-10-CM

## 2018-04-18 DIAGNOSIS — I10 Essential (primary) hypertension: Secondary | ICD-10-CM | POA: Diagnosis not present

## 2018-04-18 DIAGNOSIS — I4821 Permanent atrial fibrillation: Secondary | ICD-10-CM

## 2018-04-18 DIAGNOSIS — R001 Bradycardia, unspecified: Secondary | ICD-10-CM

## 2018-04-18 LAB — CUP PACEART INCLINIC DEVICE CHECK
Implantable Lead Implant Date: 20100112
Implantable Lead Location: 753859
Implantable Pulse Generator Implant Date: 20100112
MDC IDC LEAD IMPLANT DT: 20100112
MDC IDC LEAD LOCATION: 753860
MDC IDC SESS DTM: 20190802104151
Pulse Gen Model: 5826
Pulse Gen Serial Number: 1284954

## 2018-04-18 NOTE — Patient Instructions (Addendum)
Medication Instructions:   Your physician recommends that you continue on your current medications as directed. Please refer to the Current Medication list given to you today.   If you need a refill on your cardiac medications before your next appointment, please call your pharmacy.  Labwork: NONE ORDERED  TODAY    Testing/Procedures: NONE ORDERED  TODAY    Follow-Up: Your physician wants you to follow-up in: [place] will receive a reminder letter in the mail two months in advance. If you don't receive a letter, please call our office to schedule the follow-up appointment.   Your physician wants you to follow-up in:  [place] will receive a reminder letter in the mail two months in advance. If you don't receive a letter, please call our office to schedule the follow-up appointment.      Any Other Special Instructions Will Be Listed Below (If Applicable).

## 2018-08-03 ENCOUNTER — Emergency Department (HOSPITAL_COMMUNITY)
Admission: EM | Admit: 2018-08-03 | Discharge: 2018-08-03 | Disposition: A | Discharge status: Home / Self Care | Payer: Medicare Other | Attending: Emergency Medicine | Admitting: Emergency Medicine

## 2018-08-03 ENCOUNTER — Emergency Department (HOSPITAL_COMMUNITY): Payer: Medicare Other

## 2018-08-03 ENCOUNTER — Encounter (HOSPITAL_COMMUNITY): Payer: Self-pay

## 2018-08-03 ENCOUNTER — Other Ambulatory Visit: Payer: Self-pay

## 2018-08-03 DIAGNOSIS — K5792 Diverticulitis of intestine, part unspecified, without perforation or abscess without bleeding: Secondary | ICD-10-CM | POA: Diagnosis not present

## 2018-08-03 DIAGNOSIS — I2510 Atherosclerotic heart disease of native coronary artery without angina pectoris: Secondary | ICD-10-CM | POA: Insufficient documentation

## 2018-08-03 DIAGNOSIS — Z85828 Personal history of other malignant neoplasm of skin: Secondary | ICD-10-CM | POA: Diagnosis not present

## 2018-08-03 DIAGNOSIS — R1032 Left lower quadrant pain: Secondary | ICD-10-CM | POA: Diagnosis present

## 2018-08-03 DIAGNOSIS — Z7901 Long term (current) use of anticoagulants: Secondary | ICD-10-CM | POA: Insufficient documentation

## 2018-08-03 DIAGNOSIS — Z87891 Personal history of nicotine dependence: Secondary | ICD-10-CM | POA: Insufficient documentation

## 2018-08-03 DIAGNOSIS — I10 Essential (primary) hypertension: Secondary | ICD-10-CM | POA: Insufficient documentation

## 2018-08-03 DIAGNOSIS — Z79899 Other long term (current) drug therapy: Secondary | ICD-10-CM | POA: Diagnosis not present

## 2018-08-03 LAB — COMPREHENSIVE METABOLIC PANEL
ALK PHOS: 86 U/L (ref 38–126)
ALT: 6 U/L (ref 0–44)
AST: 21 U/L (ref 15–41)
Albumin: 3.3 g/dL — ABNORMAL LOW (ref 3.5–5.0)
Anion gap: 7 (ref 5–15)
BUN: 15 mg/dL (ref 8–23)
CHLORIDE: 101 mmol/L (ref 98–111)
CO2: 27 mmol/L (ref 22–32)
CREATININE: 1.08 mg/dL (ref 0.61–1.24)
Calcium: 8.9 mg/dL (ref 8.9–10.3)
Glucose, Bld: 122 mg/dL — ABNORMAL HIGH (ref 70–99)
Potassium: 4.6 mmol/L (ref 3.5–5.1)
Sodium: 135 mmol/L (ref 135–145)
Total Bilirubin: 0.9 mg/dL (ref 0.3–1.2)
Total Protein: 7 g/dL (ref 6.5–8.1)

## 2018-08-03 LAB — URINALYSIS, ROUTINE W REFLEX MICROSCOPIC
Bilirubin Urine: NEGATIVE
Glucose, UA: NEGATIVE mg/dL
Hgb urine dipstick: NEGATIVE
KETONES UR: 5 mg/dL — AB
Nitrite: NEGATIVE
PROTEIN: NEGATIVE mg/dL
Specific Gravity, Urine: 1.026 (ref 1.005–1.030)
pH: 6 (ref 5.0–8.0)

## 2018-08-03 LAB — CBC WITH DIFFERENTIAL/PLATELET
ABS IMMATURE GRANULOCYTES: 0.08 10*3/uL — AB (ref 0.00–0.07)
Basophils Absolute: 0 10*3/uL (ref 0.0–0.1)
Basophils Relative: 0 %
Eosinophils Absolute: 0 10*3/uL (ref 0.0–0.5)
Eosinophils Relative: 0 %
HCT: 47.5 % (ref 39.0–52.0)
HEMOGLOBIN: 15.2 g/dL (ref 13.0–17.0)
IMMATURE GRANULOCYTES: 1 %
LYMPHS ABS: 1.4 10*3/uL (ref 0.7–4.0)
LYMPHS PCT: 10 %
MCH: 29.4 pg (ref 26.0–34.0)
MCHC: 32 g/dL (ref 30.0–36.0)
MCV: 91.9 fL (ref 80.0–100.0)
MONO ABS: 1.4 10*3/uL — AB (ref 0.1–1.0)
MONOS PCT: 10 %
NEUTROS ABS: 10.6 10*3/uL — AB (ref 1.7–7.7)
NEUTROS PCT: 79 %
PLATELETS: 215 10*3/uL (ref 150–400)
RBC: 5.17 MIL/uL (ref 4.22–5.81)
RDW: 14.1 % (ref 11.5–15.5)
WBC: 13.5 10*3/uL — ABNORMAL HIGH (ref 4.0–10.5)
nRBC: 0 % (ref 0.0–0.2)

## 2018-08-03 LAB — LIPASE, BLOOD: LIPASE: 24 U/L (ref 11–51)

## 2018-08-03 MED ORDER — AMOXICILLIN-POT CLAVULANATE 875-125 MG PO TABS
1.0000 | ORAL_TABLET | Freq: Once | ORAL | Status: AC
  Administered 2018-08-03: 1 via ORAL
  Filled 2018-08-03: qty 1

## 2018-08-03 MED ORDER — AMOXICILLIN-POT CLAVULANATE 875-125 MG PO TABS: 1.0000 | ORAL_TABLET | Freq: Two times a day (BID) | ORAL | 0 refills | Status: AC

## 2018-08-03 MED ORDER — IOHEXOL 300 MG/ML  SOLN
100.0000 mL | Freq: Once | INTRAMUSCULAR | Status: AC | PRN
  Administered 2018-08-03: 100 mL via INTRAVENOUS

## 2018-08-03 NOTE — ED Provider Notes (Signed)
Blood pressure 131/72, pulse 60, temperature 98.2 F (36.8 C), temperature source Oral, resp. rate (!) 21, SpO2 98 %.  Assuming care from Dr. Gilford Raid.  In short, [name] is a 84 y.o. male with a chief complaint of Abdominal Pain .  Refer to the original H&P for additional details.  The current plan of care is to follow up labs and CT. Family now at bedside. Symptoms have been ongoing intermittently for the last 2-3 weeks. No active pain at this time. Family and patient ok with labs and imaging.   05:30 PM Patient continues to be without abdominal pain. Noted to have a leukocytosis on CBC. Remaining labs are largely unremarkable. UA without evidence of UTI. CT abdomen/pelvis pending.   08:20 PM CT scan with acute diverticulitis. No complication. No active pain at this time. No vomiting. Patient well-appearing. Discussed with him and family at bedside. Patient tolerating oral abx here. Will f/u with PCP in the coming week. Discussed ED return precautions and signs/symptoms of complicated diverticulitis in detail with the patient.   At this time, I do not feel there is any life-threatening condition present. I have reviewed and discussed all results (EKG, imaging, lab, urine as appropriate), exam findings with patient. I have reviewed nursing notes and appropriate previous records.  I feel the patient is safe to be discharged home without further emergent workup. Discussed usual and customary return precautions. Patient and family (if present) verbalize understanding and are comfortable with this plan.  Patient will follow-up with their primary care provider. If they do not have a primary care provider, information for follow-up has been provided to them. All questions have been answered.     Margette Fast, MD 08/03/18 2052

## 2018-08-03 NOTE — Discharge Instructions (Signed)
We believe your symptoms are caused by diverticulitis.  Most of the time this condition (please read through the included information) can be cured with outpatient antibiotics.  Please take the full course of prescribed medication(s) and follow up with the doctors recommended above.  Return to the ED if your abdominal pain worsens or fails to improve, you develop bloody vomiting, bloody diarrhea, you are unable to tolerate fluids due to vomiting, fever greater than 101, or other symptoms that concern you.   Diverticulitis Diverticulitis is inflammation or infection of small pouches in your colon that form when you have a condition called diverticulosis. The pouches in your colon are called diverticula. Your colon, or large intestine, is where water is absorbed and stool is formed. Complications of diverticulitis can include: Bleeding. Severe infection. Severe pain. Perforation of your colon. Obstruction of your colon. CAUSES  Diverticulitis is caused by bacteria. Diverticulitis happens when stool becomes trapped in diverticula. This allows bacteria to grow in the diverticula, which can lead to inflammation and infection. RISK FACTORS People with diverticulosis are at risk for diverticulitis. Eating a diet that does not include enough fiber from fruits and vegetables may make diverticulitis more likely to develop. SYMPTOMS  Symptoms of diverticulitis may include: Abdominal pain and tenderness. The pain is normally located on the left side of the abdomen, but may occur in other areas. Fever and chills. Bloating. Cramping. Nausea. Vomiting. Constipation. Diarrhea. Blood in your stool. DIAGNOSIS  Your health care provider will ask you about your medical history and do a physical exam. You may need to have tests done because many medical conditions can cause the same symptoms as diverticulitis. Tests may include: Blood tests. Urine tests. Imaging tests of the abdomen, including X-rays and  CT scans. When your condition is under control, your health care provider may recommend that you have a colonoscopy. A colonoscopy can show how severe your diverticula are and whether something else is causing your symptoms. TREATMENT  Most cases of diverticulitis are mild and can be treated at home. Treatment may include: Taking over-the-counter pain medicines. Following a clear liquid diet. Taking antibiotic medicines by mouth for 7-10 days. More severe cases may be treated at a hospital. Treatment may include: Not eating or drinking. Taking prescription pain medicine. Receiving antibiotic medicines through an IV tube. Receiving fluids and nutrition through an IV tube. Surgery. HOME CARE INSTRUCTIONS  Follow your health care provider's instructions carefully. Follow a full liquid diet or other diet as directed by your health care provider. After your symptoms improve, your health care provider may tell you to change your diet. He or she may recommend you eat a high-fiber diet. Fruits and vegetables are good sources of fiber. Fiber makes it easier to pass stool. Take fiber supplements or probiotics as directed by your health care provider. Only take medicines as directed by your health care provider. Keep all your follow-up appointments. SEEK MEDICAL CARE IF:  Your pain does not improve. You have a hard time eating food. Your bowel movements do not return to normal. SEEK IMMEDIATE MEDICAL CARE IF:  Your pain becomes worse. Your symptoms do not get better. Your symptoms suddenly get worse. You have a fever. You have repeated vomiting. You have bloody or black, tarry stools. MAKE SURE YOU:  Understand these instructions. Will watch your condition. Will get help right away if you are not doing well or get worse. Document Released: 06/13/2005 Document Revised: 09/08/2013 Document Reviewed: 07/29/2013 [facility] Patient Information 2015 [place], [place].  This information is not intended  to replace advice given to you by your health care provider. Make sure you discuss any questions you have with your health care provider.

## 2018-08-03 NOTE — ED Triage Notes (Signed)
Pt from home c.o severe lower abd pain that started around 1130 this morning when he woke up. Pain radiates down to his groin and lower back. Hx of kidney stones and a hernia but states this feels different. VSS. Pt refusing IV at this time

## 2018-08-03 NOTE — ED Notes (Signed)
Pt refuses IV and states he wants [place] to wait for wife to draw lab work. Will inform provider.

## 2018-08-03 NOTE — ED Provider Notes (Signed)
[facility] EMERGENCY DEPARTMENT Provider Note   CSN: [id number] Arrival date & time: 08/03/18  1426     History   Chief Complaint Chief Complaint  Patient presents with  . Abdominal Pain    HPI [name] is a 84 y.o. male.  Pt presents to the ED today with abdominal pain.  The pt The pt said it is in his lower abdomen.  It started at 1130.  He denies any pain now.  He called EMS, but refused an IV.  He is still refusing an IV.  The pt denies n/v.  He denies constipation or diarrhea.     Past Medical History:  Diagnosis Date  . CAD (coronary artery disease) 09/2008  . Cancer ([place])    basal cell carcinoma  . CHF (congestive heart failure) ([place])   . Complication of anesthesia    06/12/11 anesthesia made legs asleep also and went into chf and ended up in ICU after gallbladder surgery  . Diastolic dysfunction    hx of chf due to fluid overload 9/12   . Dysrhythmia    atrial fibrillation  . GERD (gastroesophageal reflux disease)   . History of blood product transfusion    d/t bleeding ulcer  . Hypertension   . Macular degeneration (senile) of retina   . Persistent atrial fibrillation   . PONV (postoperative nausea and vomiting)   . Presence of permanent cardiac pacemaker 2010  . Shortness of breath dyspnea    exertion  . Sick sinus syndrome (HCC)    St. Jude  . Syncope    resolved s/p PPM    Patient Active Problem List   Diagnosis Date Noted  . Intracranial aneurysm 11/28/2016  . Gait disturbance 11/06/2016  . Muscle fatigue 11/06/2016  . Post herpetic neuralgia 11/06/2016  . Left inguinal hernia 10/06/2014  . Shortness of breath 01/03/2014  . Pacemaker-St.Jude 07/09/2012  . Permanent atrial fibrillation 10/20/2010  . HYPERLIPIDEMIA-MIXED 10/26/2009  . Macular degeneration 12/28/2008  . CAD, NATIVE VESSEL 12/28/2008  . GERD 12/28/2008  . SYNCOPE 12/28/2008  . Hypertension 12/28/2008  . Sick sinus syndrome ([place]) 12/28/2008     Past Surgical History:  Procedure Laterality Date  . CHOLECYSTECTOMY  06/12/11  . INGUINAL HERNIA REPAIR Left 10/06/2014   Procedure: OPEN REPAIR LEFT INGUINAL HERNIA ;  Surgeon: Fanny Skates, MD;  Location: [place];  Service: General;  Laterality: Left;  . INSERTION OF MESH Left 10/06/2014   Procedure: INSERTION OF MESH;  Surgeon: Fanny Skates, MD;  Location: [facility];  Service: General;  Laterality: Left;  . IR KYPHO LUMBAR INC FX REDUCE BONE BX UNI/BIL CANNULATION INC/IMAGING  12/26/2017  . IR RADIOLOGIST EVAL & MGMT  12/12/2017  . LAPAROSCOPIC CHOLECYSTECTOMY W/ CHOLANGIOGRAPHY  06/12/2011  . PACEMAKER INSERTION  09/28/08   SJM Zephyr XL DR  . perforated ulcer    . right inguinal hernia repair  11/19/2002   with mesh  . TRANSURETHRAL RESECTION OF BLADDER TUMOR  09/06/2011   Procedure: TRANSURETHRAL RESECTION OF BLADDER TUMOR (TURBT);  Surgeon: Ailene Rud, MD;  Location: [facility];  Service: Urology;  Laterality: N/A;  . TRANSURETHRAL RESECTION OF PROSTATE  78/29/5621  . umblical hernia  11/22/6576        Home Medications    Prior to Admission medications   Medication Sig Start Date End Date Taking? Authorizing Provider  carbidopa-levodopa (SINEMET) 25-100 MG tablet Take 1 tablet by mouth 3 (three) times daily. 06/14/17  Yes Varney Biles, MD  Cholecalciferol (VITAMIN D) 2000 units tablet Take 2,000 Units by mouth daily.    Yes [provider]  ELIQUIS 5 MG TABS tablet TAKE 1 TABLET TWICE A DAY Patient taking differently: Take 5 mg by mouth 2 (two) times daily.  04/15/18  Yes Allred, Jeneen Rinks, MD  Magnesium 500 MG TABS Take 500 mg by mouth daily.    Yes [provider]  triamcinolone cream (KENALOG) 0.1 % Apply 1 application topically daily as needed (for irritation).  05/23/16  Yes [provider]  famotidine (PEPCID) 40 MG tablet Take 40 mg by mouth 2 (two) times daily.    [provider]    Family History Family History  Problem Relation  Age of Onset  . Heart attack Mother 42       MI  . Heart failure Father   . Heart attack Brother        Late 39s  . Cancer Sister     Social History Social History   Tobacco Use  . Smoking status: Former Smoker    Last attempt to quit: 09/17/1958    Years since quitting: 59.9  . Smokeless tobacco: Never Used  . Tobacco comment: quit 1968  Substance Use Topics  . Alcohol use: No  . Drug use: No     Allergies   Hydrocodone and Hydroxyzine   Review of Systems Review of Systems  Gastrointestinal: Positive for abdominal pain.  All other systems reviewed and are negative.    Physical Exam Updated Vital Signs BP 131/72   Pulse 60   Temp 98.2 F (36.8 C) (Oral)   Resp (!) 21   SpO2 98%   Physical Exam  Constitutional: He appears well-developed and well-nourished.  HENT:  Head: Normocephalic and atraumatic.  Mouth/Throat: Oropharynx is clear and moist.  Eyes: Pupils are equal, round, and reactive to light. EOM are normal.  Cardiovascular: Normal rate, regular rhythm, normal heart sounds and intact distal pulses.  Pulmonary/Chest: Effort normal and breath sounds normal.  Abdominal: Normal appearance and bowel sounds are normal.  Neurological: He is alert.  Skin: Skin is warm and dry. Capillary refill takes less than 2 seconds.  Psychiatric: He has a normal mood and affect. His behavior is normal.  Nursing note and vitals reviewed.    ED Treatments / Results  Labs (all labs ordered are listed, but only abnormal results are displayed) Labs Reviewed  URINE CULTURE  CBC WITH DIFFERENTIAL/PLATELET  COMPREHENSIVE METABOLIC PANEL  LIPASE, BLOOD  URINALYSIS, ROUTINE W REFLEX MICROSCOPIC    EKG EKG Interpretation  Date/Time:  Sunday August 03 2018 14:33:37 EST Ventricular Rate:  54 PR Interval:    QRS Duration: 138 QT Interval:  439 QTC Calculation: 416 R Axis:   -76 Text Interpretation:  Atrial fibrillation paced No significant change since last tracing  Confirmed by Isla Pence [phone]) on 08/03/2018 3:18:56 PM   Radiology No results found.  Procedures Procedures (including critical care time)  Medications Ordered in ED Medications - No data to display   Initial Impression / Assessment and Plan / ED Course  I have reviewed the triage vital signs and the nursing notes.  Pertinent labs & imaging results that were available during my care of the patient were reviewed by me and considered in my medical decision making (see chart for details).     Pt still refusing IV.  He does not want anything for pain now.  Pt now refusing blood until his wife gets here.  Pt signed  out to Dr. Laverta Baltimore pending his wife's arrival.  Final Clinical Impressions(s) / ED Diagnoses   Final diagnoses:  Lower abdominal pain    ED Discharge Orders    None       Isla Pence, MD 08/03/18 [phone]

## 2018-08-03 NOTE — ED Notes (Signed)
Patient transported to CT 

## 2018-08-04 LAB — URINE CULTURE: Culture: NO GROWTH

## 2018-10-03 ENCOUNTER — Encounter: Payer: Self-pay | Admitting: Podiatry

## 2018-10-03 ENCOUNTER — Ambulatory Visit (INDEPENDENT_AMBULATORY_CARE_PROVIDER_SITE_OTHER): Payer: Medicare Other | Admitting: Podiatry

## 2018-10-03 DIAGNOSIS — M79675 Pain in left toe(s): Secondary | ICD-10-CM | POA: Diagnosis not present

## 2018-10-03 DIAGNOSIS — D689 Coagulation defect, unspecified: Secondary | ICD-10-CM | POA: Diagnosis not present

## 2018-10-03 DIAGNOSIS — M79674 Pain in right toe(s): Secondary | ICD-10-CM | POA: Diagnosis not present

## 2018-10-03 DIAGNOSIS — B351 Tinea unguium: Secondary | ICD-10-CM

## 2018-10-03 NOTE — Progress Notes (Signed)
Complaint:  Visit Type: Patient returns to my office for continued preventative foot care services. Complaint: Patient states" my nails have grown long and thick and become painful to walk and wear shoes" Patient is taking eliquiss.. The patient presents for preventative foot care services. No changes to ROS  Podiatric Exam: Vascular: dorsalis pedis and posterior tibial pulses are palpable bilateral. Capillary return is immediate. Temperature gradient is WNL. Skin turgor WNL  Sensorium: Normal Semmes Weinstein monofilament test. Normal tactile sensation bilaterally. Nail Exam: Pt has thick disfigured discolored nails with subungual debris noted bilateral entire nail hallux through fifth toenails Ulcer Exam: There is no evidence of ulcer or pre-ulcerative changes or infection. Orthopedic Exam: Muscle tone and strength are WNL. No limitations in general ROM. No crepitus or effusions noted. Foot type and digits show no abnormalities. Bony prominences are unremarkable. Skin: No Porokeratosis. No infection or ulcers  Diagnosis:  Onychomycosis, , Pain in right toe, pain in left toes  Treatment & Plan Procedures and Treatment: Consent by patient was obtained for treatment procedures.   Debridement of mycotic and hypertrophic toenails, 1 through 5 bilateral and clearing of subungual debris. No ulceration, no infection noted.  Return Visit-Office Procedure: Patient instructed to return to the office for a follow up visit prn  for continued evaluation and treatment.    Gardiner Barefoot DPM

## 2018-10-12 ENCOUNTER — Other Ambulatory Visit: Payer: Self-pay | Admitting: Internal Medicine

## 2018-10-13 NOTE — Telephone Encounter (Signed)
Pt last saw Chanetta Marshall, NP on 04/18/18, last labs 08/03/18 Creat 1.08, age 85, weight 76.7kg, based on specified criteria pt is on appropriate dosage of Eliquis 5mg  BID.  Will refill rx.

## 2018-10-17 ENCOUNTER — Other Ambulatory Visit: Payer: Self-pay | Admitting: Nurse Practitioner

## 2018-10-17 ENCOUNTER — Ambulatory Visit (INDEPENDENT_AMBULATORY_CARE_PROVIDER_SITE_OTHER): Payer: Medicare Other | Admitting: Nurse Practitioner

## 2018-10-17 VITALS — BP 136/72 | HR 56 | Ht 68.0 in | Wt 170.6 lb

## 2018-10-17 DIAGNOSIS — I10 Essential (primary) hypertension: Secondary | ICD-10-CM

## 2018-10-17 DIAGNOSIS — I4821 Permanent atrial fibrillation: Secondary | ICD-10-CM

## 2018-10-17 DIAGNOSIS — Z01812 Encounter for preprocedural laboratory examination: Secondary | ICD-10-CM

## 2018-10-17 DIAGNOSIS — R001 Bradycardia, unspecified: Secondary | ICD-10-CM | POA: Diagnosis not present

## 2018-10-17 NOTE — Progress Notes (Signed)
Electrophysiology Office Note Date: 10/17/2018  ID:  [name], [name] 10-15-1930, MRN [id number]  PCP: Ryan Gravel, MD Electrophysiologist: [name]  CC: Pacemaker follow-up  [name] is a 85 y.o. male seen today for Dr [name].  He presents today for routine electrophysiology followup.  Since last being seen in our clinic, the patient reports doing very well.  He denies chest pain, palpitations, dyspnea, PND, orthopnea, nausea, vomiting, dizziness, syncope, edema, weight gain, or early satiety.  Device History: STJ daul chamber PPM implanted 2010 for symptomatic bradycardia   Past Medical History:  Diagnosis Date  . CAD (coronary artery disease) 09/2008  . Cancer ([place])    basal cell carcinoma  . CHF (congestive heart failure) ([place])   . Complication of anesthesia    06/12/11 anesthesia made legs asleep also and went into chf and ended up in ICU after gallbladder surgery  . Diastolic dysfunction    hx of chf due to fluid overload 9/12   . Dysrhythmia    atrial fibrillation  . GERD (gastroesophageal reflux disease)   . History of blood product transfusion    d/t bleeding ulcer  . Hypertension   . Macular degeneration (senile) of retina   . Persistent atrial fibrillation   . PONV (postoperative nausea and vomiting)   . Presence of permanent cardiac pacemaker 2010  . Shortness of breath dyspnea    exertion  . Sick sinus syndrome (HCC)    St. Jude  . Syncope    resolved s/p PPM   Past Surgical History:  Procedure Laterality Date  . CHOLECYSTECTOMY  06/12/11  . INGUINAL HERNIA REPAIR Left 10/06/2014   Procedure: OPEN REPAIR LEFT INGUINAL HERNIA ;  Surgeon: Fanny Skates, MD;  Location: [facility];  Service: General;  Laterality: Left;  . INSERTION OF MESH Left 10/06/2014   Procedure: INSERTION OF MESH;  Surgeon: Fanny Skates, MD;  Location: [facility];  Service: General;  Laterality: Left;  . IR KYPHO LUMBAR INC FX REDUCE BONE BX UNI/BIL CANNULATION INC/IMAGING  12/26/2017    . IR RADIOLOGIST EVAL & MGMT  12/12/2017  . LAPAROSCOPIC CHOLECYSTECTOMY W/ CHOLANGIOGRAPHY  06/12/2011  . PACEMAKER INSERTION  09/28/08   SJM Zephyr XL DR  . perforated ulcer    . right inguinal hernia repair  11/19/2002   with mesh  . TRANSURETHRAL RESECTION OF BLADDER TUMOR  09/06/2011   Procedure: TRANSURETHRAL RESECTION OF BLADDER TUMOR (TURBT);  Surgeon: Ailene Rud, MD;  [facility];  Service: Urology;  Laterality: N/A;  . TRANSURETHRAL RESECTION OF PROSTATE  17/00/1749  . umblical hernia  4/49/6759    Current Outpatient Medications  Medication Sig Dispense Refill  . carbidopa-levodopa (SINEMET) 25-100 MG tablet Take 1 tablet by mouth 3 (three) times daily. 30 tablet 0  . Cholecalciferol (VITAMIN D) 2000 units tablet Take 2,000 Units by mouth daily.     [name] ELIQUIS 5 MG TABS tablet TAKE 1 TABLET TWICE A DAY 180 tablet 2  . famotidine (PEPCID) 40 MG tablet Take 40 mg by mouth 2 (two) times daily.    . Magnesium 500 MG TABS Take 500 mg by mouth daily.     [name] triamcinolone cream (KENALOG) 0.5 %      No current facility-administered medications for this visit.     Allergies:   Hydrocodone and Hydroxyzine   Social History: Social History   Socioeconomic History  . Marital status: Married    Spouse name: Not on file  . Number of children:  Not on file  . Years of education: Not on file  . Highest education level: Not on file  Occupational History  . Not on file  Social Needs  . Financial resource strain: Not on file  . Food insecurity:    Worry: Not on file    Inability: Not on file  . Transportation needs:    Medical: Not on file    Non-medical: Not on file  Tobacco Use  . Smoking status: Former Smoker    Last attempt to quit: 09/17/1958    Years since quitting: 60.1  . Smokeless tobacco: Never Used  . Tobacco comment: quit 1968  Substance and Sexual Activity  . Alcohol use: No  . Drug use: No  . Sexual activity: Not on file  Lifestyle  . Physical  activity:    Days per week: Not on file    Minutes per session: Not on file  . Stress: Not on file  Relationships  . Social connections:    Talks on phone: Not on file    Gets together: Not on file    Attends religious service: Not on file    Active member of club or organization: Not on file    Attends meetings of clubs or organizations: Not on file    Relationship status: Not on file  . Intimate partner violence:    Fear of current or ex partner: Not on file    Emotionally abused: Not on file    Physically abused: Not on file    Forced sexual activity: Not on file  Other Topics Concern  . Not on file  Social History Narrative  . Not on file    Family History: Family History  Problem Relation Age of Onset  . Heart attack Mother 74       MI  . Heart failure Father   . Heart attack Brother        Late 13s  . Cancer Sister      Review of Systems: All other systems reviewed and are otherwise negative except as noted above.   Physical Exam: VS:  BP 136/72   Pulse (!) 56   Ht 5\' 8"  (1.727 m)   Wt 170 lb 9.6 oz (77.4 kg)   SpO2 92%   BMI 25.94 kg/m  , BMI Body mass index is 25.94 kg/m.  GEN- The patient is elderly appearing, alert and oriented x 3 today.   HEENT: normocephalic, atraumatic; sclera clear, conjunctiva pink; hearing intact; oropharynx clear; neck supple  Lungs- Clear to ausculation bilaterally, normal work of breathing.  No wheezes, rales, rhonchi Heart- Irregular rate and rhythm  GI- soft, non-tender, non-distended, bowel sounds present  Extremities- no clubbing, cyanosis, or edema  MS- no significant deformity or atrophy Skin- warm and dry, no rash or lesion; PPM pocket well healed Psych- euthymic mood, full affect Neuro- strength and sensation are intact  PPM Interrogation- reviewed in detail today,  See PACEART report  EKG:  EKG is not ordered today.  Recent Labs: 08/03/2018: ALT 6; BUN 15; Creatinine, Ser 1.08; Hemoglobin 15.2; Platelets  215; Potassium 4.6; Sodium 135   Wt Readings from Last 3 Encounters:  10/17/18 170 lb 9.6 oz (77.4 kg)  04/18/18 169 lb (76.7 kg)  12/26/17 172 lb (78 kg)     Assessment and Plan:  1.  Symptomatic bradycardia PPM at [facility] Risks, benefits to gen change reviewed with patient and wife who wish to proceed. Will plan for next available time with  Dr [name] Hold eliquis for 24 hours prior to procedure  2.  Permanent atrial fibrillation V rates controlled Continue OAC long term for CHADS2VASC of 4  3.  HTN Stable No change required today    Current medicines are reviewed at length with the patient today.   The patient does not have concerns regarding his medicines.  The following changes were made today:  none  Labs/ tests ordered today include:  Orders Placed This Encounter  Procedures  . Basic Metabolic Panel (BMET)  . CBC     Disposition:   Follow up with Dr [name] after gen change      Signed, Ryan Marshall, NP 10/17/2018 4:12 PM  [facility] [address] [place] [address] [phone] (office) [phone] (fax)

## 2018-10-17 NOTE — Patient Instructions (Signed)
Medication Instructions:  none If you need a refill on your cardiac medications before your next appointment, please call your pharmacy.   Lab work:TODAY CBC BMET If you have labs (blood work) drawn today and your tests are completely normal, you will receive your results only by: [name] MyChart Message (if you have MyChart) OR . A paper copy in the mail If you have any lab test that is abnormal or we need to change your treatment, we will call you to review the results.  Testing/Procedures: GENERATOR CHANGE OUT Your physician has recommended that you have a pacemaker inserted. A pacemaker is a small device that is placed under the skin of your chest or abdomen to help control abnormal heart rhythms. This device uses electrical pulses to prompt the heart to beat at a normal rate. Pacemakers are used to treat heart rhythms that are too slow. Wire (leads) are attached to the pacemaker that goes into the chambers of you heart. This is done in the hospital and usually requires and overnight stay. Please see the instruction sheet given to you today for more information.    [place] WITH DEVICE CLINIC 10-14 DAYS FROM 10/21/18  91 DAYS FROM 2/4 WITH Dr Rayann Heman  At [facility], you and your health needs are our priority.  As part of our continuing mission to provide you with exceptional heart care, we have created designated Provider Care Teams.  These Care Teams include your primary Cardiologist (physician) and Advanced Practice Providers (APPs -  Physician Assistants and Nurse Practitioners) who all work together to provide you with the care you need, when you need it. .   Any Other Special Instructions Will Be Listed Below (If Applicable). Please arrive at [facility] at 1:30 PM 10/21/2018 Do not eat or drink after midnight the night prior to the procedure Do not take any medications the morning of the test TAKE LAST ELIQUIS DOSE  10/19/2018 Will need someone to drive you home at discharge

## 2018-10-17 NOTE — H&P (View-Only) (Signed)
Electrophysiology Office Note Date: 10/17/2018  ID:  [name], [name] Sep 19, 1930, MRN [id number]  PCP: [name], MD Electrophysiologist: Rayann Heman  CC: Pacemaker follow-up  [name] is a 85 y.o. male seen today for Dr Rayann Heman.  He presents today for routine electrophysiology followup.  Since last being seen in our clinic, the patient reports doing very well.  He denies chest pain, palpitations, dyspnea, PND, orthopnea, nausea, vomiting, dizziness, syncope, edema, weight gain, or early satiety.  Device History: STJ daul chamber PPM implanted 2010 for symptomatic bradycardia   Past Medical History:  Diagnosis Date  . CAD (coronary artery disease) 09/2008  . Cancer ([facility])    basal cell carcinoma  . CHF (congestive heart failure) ([facility])   . Complication of anesthesia    06/12/11 anesthesia made legs asleep also and went into chf and ended up in ICU after gallbladder surgery  . Diastolic dysfunction    hx of chf due to fluid overload 9/12   . Dysrhythmia    atrial fibrillation  . GERD (gastroesophageal reflux disease)   . History of blood product transfusion    d/t bleeding ulcer  . Hypertension   . Macular degeneration (senile) of retina   . Persistent atrial fibrillation   . PONV (postoperative nausea and vomiting)   . Presence of permanent cardiac pacemaker 2010  . Shortness of breath dyspnea    exertion  . Sick sinus syndrome ([facility])    St. Jude  . Syncope    resolved s/p PPM   Past Surgical History:  Procedure Laterality Date  . CHOLECYSTECTOMY  06/12/11  . INGUINAL HERNIA REPAIR Left 10/06/2014   Procedure: OPEN REPAIR LEFT INGUINAL HERNIA ;  Surgeon: Fanny Skates, MD;  Location: [facility];  Service: General;  Laterality: Left;  . INSERTION OF MESH Left 10/06/2014   Procedure: INSERTION OF MESH;  Surgeon: Fanny Skates, MD;  Location: [facility];  Service: General;  Laterality: Left;  . IR KYPHO LUMBAR INC FX REDUCE BONE BX UNI/BIL CANNULATION INC/IMAGING  12/26/2017    . IR RADIOLOGIST EVAL & MGMT  12/12/2017  . LAPAROSCOPIC CHOLECYSTECTOMY W/ CHOLANGIOGRAPHY  06/12/2011  . PACEMAKER INSERTION  09/28/08   SJM [place] XL DR  . perforated ulcer    . right inguinal hernia repair  11/19/2002   with mesh  . TRANSURETHRAL RESECTION OF BLADDER TUMOR  09/06/2011   Procedure: TRANSURETHRAL RESECTION OF BLADDER TUMOR (TURBT);  Surgeon: Ailene Rud, MD;  Location: [facility];  Service: Urology;  Laterality: N/A;  . TRANSURETHRAL RESECTION OF PROSTATE  93/79/0240  . umblical hernia  9/73/5329    Current Outpatient Medications  Medication Sig Dispense Refill  . carbidopa-levodopa (SINEMET) 25-100 MG tablet Take 1 tablet by mouth 3 (three) times daily. 30 tablet 0  . Cholecalciferol (VITAMIN D) 2000 units tablet Take 2,000 Units by mouth daily.     [name] ELIQUIS 5 MG TABS tablet TAKE 1 TABLET TWICE A DAY 180 tablet 2  . famotidine (PEPCID) 40 MG tablet Take 40 mg by mouth 2 (two) times daily.    . Magnesium 500 MG TABS Take 500 mg by mouth daily.     [name] triamcinolone cream (KENALOG) 0.5 %      No current facility-administered medications for this visit.     Allergies:   Hydrocodone and Hydroxyzine   Social History: Social History   Socioeconomic History  . Marital status: Married    Spouse name: Not on file  . Number of children:  Not on file  . Years of education: Not on file  . Highest education level: Not on file  Occupational History  . Not on file  Social Needs  . Financial resource strain: Not on file  . Food insecurity:    Worry: Not on file    Inability: Not on file  . Transportation needs:    Medical: Not on file    Non-medical: Not on file  Tobacco Use  . Smoking status: Former Smoker    Last attempt to quit: 09/17/1958    Years since quitting: 60.1  . Smokeless tobacco: Never Used  . Tobacco comment: quit 1968  Substance and Sexual Activity  . Alcohol use: No  . Drug use: No  . Sexual activity: Not on file  Lifestyle  . Physical  activity:    Days per week: Not on file    Minutes per session: Not on file  . Stress: Not on file  Relationships  . Social connections:    Talks on phone: Not on file    Gets together: Not on file    Attends religious service: Not on file    Active member of club or organization: Not on file    Attends meetings of clubs or organizations: Not on file    Relationship status: Not on file  . Intimate partner violence:    Fear of current or ex partner: Not on file    Emotionally abused: Not on file    Physically abused: Not on file    Forced sexual activity: Not on file  Other Topics Concern  . Not on file  Social History Narrative  . Not on file    Family History: Family History  Problem Relation Age of Onset  . Heart attack Mother 53       MI  . Heart failure Father   . Heart attack Brother        Late 93s  . Cancer Sister      Review of Systems: All other systems reviewed and are otherwise negative except as noted above.   Physical Exam: VS:  BP 136/72   Pulse (!) 56   Ht 5\' 8"  (1.727 m)   Wt 170 lb 9.6 oz (77.4 kg)   SpO2 92%   BMI 25.94 kg/m  , BMI Body mass index is 25.94 kg/m.  GEN- The patient is elderly appearing, alert and oriented x 3 today.   HEENT: normocephalic, atraumatic; sclera clear, conjunctiva pink; hearing intact; oropharynx clear; neck supple  Lungs- Clear to ausculation bilaterally, normal work of breathing.  No wheezes, rales, rhonchi Heart- Irregular rate and rhythm  GI- soft, non-tender, non-distended, bowel sounds present  Extremities- no clubbing, cyanosis, or edema  MS- no significant deformity or atrophy Skin- warm and dry, no rash or lesion; PPM pocket well healed Psych- euthymic mood, full affect Neuro- strength and sensation are intact  PPM Interrogation- reviewed in detail today,  See PACEART report  EKG:  EKG is not ordered today.  Recent Labs: 08/03/2018: ALT 6; BUN 15; Creatinine, Ser 1.08; Hemoglobin 15.2; Platelets  215; Potassium 4.6; Sodium 135   Wt Readings from Last 3 Encounters:  10/17/18 170 lb 9.6 oz (77.4 kg)  04/18/18 169 lb (76.7 kg)  12/26/17 172 lb (78 kg)     Assessment and Plan:  1.  Symptomatic bradycardia PPM at [facility] Risks, benefits to gen change reviewed with patient and wife who wish to proceed. Will plan for next available time with  Dr Rayann Heman Hold eliquis for 24 hours prior to procedure  2.  Permanent atrial fibrillation V rates controlled Continue OAC long term for CHADS2VASC of 4  3.  HTN Stable No change required today    Current medicines are reviewed at length with the patient today.   The patient does not have concerns regarding his medicines.  The following changes were made today:  none  Labs/ tests ordered today include:  Orders Placed This Encounter  Procedures  . Basic Metabolic Panel (BMET)  . CBC     Disposition:   Follow up with Dr Rayann Heman after gen change      Signed, Chanetta Marshall, NP 10/17/2018 4:12 PM  [facility] [address] [place] [address] [phone] (office) [phone] (fax)

## 2018-10-18 LAB — SPECIMEN STATUS

## 2018-10-20 ENCOUNTER — Telehealth: Payer: Self-pay | Admitting: Internal Medicine

## 2018-10-20 NOTE — Telephone Encounter (Signed)
Spoke to patient's wife and reviewed medication instructions for 10/21/18.  She verbalized understanding and was thankful for the call.

## 2018-10-20 NOTE — Telephone Encounter (Signed)
New message   [name] states that patient is scheduled for cathlab on 10/21/2018. Please call to discuss instructions.

## 2018-10-21 ENCOUNTER — Other Ambulatory Visit: Payer: Self-pay

## 2018-10-21 ENCOUNTER — Encounter (HOSPITAL_COMMUNITY)
Admission: RE | Disposition: A | Discharge status: Home / Self Care | Payer: Self-pay | Source: Home / Self Care | Attending: Internal Medicine

## 2018-10-21 ENCOUNTER — Ambulatory Visit (HOSPITAL_COMMUNITY)
Admission: RE | Admit: 2018-10-21 | Discharge: 2018-10-21 | Disposition: A | Discharge status: Home / Self Care | Payer: Medicare Other | Attending: Internal Medicine | Admitting: Internal Medicine

## 2018-10-21 DIAGNOSIS — Z4502 Encounter for adjustment and management of automatic implantable cardiac defibrillator: Secondary | ICD-10-CM | POA: Insufficient documentation

## 2018-10-21 DIAGNOSIS — Z79899 Other long term (current) drug therapy: Secondary | ICD-10-CM | POA: Insufficient documentation

## 2018-10-21 DIAGNOSIS — Z8249 Family history of ischemic heart disease and other diseases of the circulatory system: Secondary | ICD-10-CM | POA: Diagnosis not present

## 2018-10-21 DIAGNOSIS — I441 Atrioventricular block, second degree: Secondary | ICD-10-CM | POA: Insufficient documentation

## 2018-10-21 DIAGNOSIS — R001 Bradycardia, unspecified: Secondary | ICD-10-CM | POA: Diagnosis not present

## 2018-10-21 DIAGNOSIS — Z885 Allergy status to narcotic agent status: Secondary | ICD-10-CM | POA: Insufficient documentation

## 2018-10-21 DIAGNOSIS — Z7901 Long term (current) use of anticoagulants: Secondary | ICD-10-CM | POA: Diagnosis not present

## 2018-10-21 DIAGNOSIS — I2510 Atherosclerotic heart disease of native coronary artery without angina pectoris: Secondary | ICD-10-CM | POA: Diagnosis not present

## 2018-10-21 DIAGNOSIS — I4821 Permanent atrial fibrillation: Secondary | ICD-10-CM | POA: Insufficient documentation

## 2018-10-21 DIAGNOSIS — I110 Hypertensive heart disease with heart failure: Secondary | ICD-10-CM | POA: Insufficient documentation

## 2018-10-21 DIAGNOSIS — K219 Gastro-esophageal reflux disease without esophagitis: Secondary | ICD-10-CM | POA: Diagnosis not present

## 2018-10-21 DIAGNOSIS — I509 Heart failure, unspecified: Secondary | ICD-10-CM | POA: Diagnosis not present

## 2018-10-21 DIAGNOSIS — Z87891 Personal history of nicotine dependence: Secondary | ICD-10-CM | POA: Insufficient documentation

## 2018-10-21 DIAGNOSIS — Z45010 Encounter for checking and testing of cardiac pacemaker pulse generator [battery]: Secondary | ICD-10-CM

## 2018-10-21 HISTORY — PX: PPM GENERATOR CHANGEOUT: EP1233

## 2018-10-21 LAB — CBC
HCT: 48 % (ref 39.0–52.0)
Hemoglobin: 15.2 g/dL (ref 13.0–17.0)
MCH: 29.3 pg (ref 26.0–34.0)
MCHC: 31.7 g/dL (ref 30.0–36.0)
MCV: 92.5 fL (ref 80.0–100.0)
Platelets: 210 10*3/uL (ref 150–400)
RBC: 5.19 MIL/uL (ref 4.22–5.81)
RDW: 13.8 % (ref 11.5–15.5)
WBC: 7.5 10*3/uL (ref 4.0–10.5)
nRBC: 0 % (ref 0.0–0.2)

## 2018-10-21 LAB — SURGICAL PCR SCREEN
MRSA, PCR: POSITIVE — AB
Staphylococcus aureus: POSITIVE — AB

## 2018-10-21 SURGERY — PPM GENERATOR CHANGEOUT

## 2018-10-21 MED ORDER — ACETAMINOPHEN 325 MG PO TABS: 325.0000 mg | ORAL_TABLET | ORAL | Status: DC | PRN

## 2018-10-21 MED ORDER — MUPIROCIN 2 % EX OINT
TOPICAL_OINTMENT | CUTANEOUS | Status: AC
  Administered 2018-10-21: 14:00:00
  Filled 2018-10-21: qty 22

## 2018-10-21 MED ORDER — ONDANSETRON HCL 4 MG/2ML IJ SOLN: 4.0000 mg | Freq: Four times a day (QID) | INTRAMUSCULAR | Status: DC | PRN

## 2018-10-21 MED ORDER — CHLORHEXIDINE GLUCONATE 4 % EX LIQD: 60.0000 mL | Freq: Once | CUTANEOUS | Status: DC

## 2018-10-21 MED ORDER — CEFAZOLIN SODIUM-DEXTROSE 2-4 GM/100ML-% IV SOLN
INTRAVENOUS | Status: AC
  Filled 2018-10-21: qty 100

## 2018-10-21 MED ORDER — CEFAZOLIN SODIUM-DEXTROSE 2-4 GM/100ML-% IV SOLN
2.0000 g | INTRAVENOUS | Status: AC
  Administered 2018-10-21: 2 g via INTRAVENOUS

## 2018-10-21 MED ORDER — SODIUM CHLORIDE 0.9 % IV SOLN
INTRAVENOUS | Status: DC
  Administered 2018-10-21: 14:00:00 via INTRAVENOUS

## 2018-10-21 MED ORDER — LIDOCAINE HCL (PF) 1 % IJ SOLN
INTRAMUSCULAR | Status: AC
  Filled 2018-10-21: qty 30

## 2018-10-21 MED ORDER — SODIUM CHLORIDE 0.9 % IV SOLN
80.0000 mg | INTRAVENOUS | Status: AC
  Administered 2018-10-21: 80 mg

## 2018-10-21 MED ORDER — SODIUM CHLORIDE 0.9% FLUSH: 3.0000 mL | Freq: Two times a day (BID) | INTRAVENOUS | Status: DC

## 2018-10-21 MED ORDER — LIDOCAINE HCL (PF) 1 % IJ SOLN
INTRAMUSCULAR | Status: DC | PRN
  Administered 2018-10-21: 55 mL

## 2018-10-21 MED ORDER — SODIUM CHLORIDE 0.9% FLUSH: 3.0000 mL | INTRAVENOUS | Status: DC | PRN

## 2018-10-21 MED ORDER — SODIUM CHLORIDE 0.9 % IV SOLN: 250.0000 mL | INTRAVENOUS | Status: DC | PRN

## 2018-10-21 MED ORDER — SODIUM CHLORIDE 0.9 % IV SOLN
INTRAVENOUS | Status: AC
  Filled 2018-10-21: qty 2

## 2018-10-21 SURGICAL SUPPLY — 4 items
CABLE SURGICAL S-101-97-12 (CABLE) ×3 IMPLANT
PACEMAKER ASSURITY SR-SF (Pacemaker) ×2 IMPLANT
PAD PRO RADIOLUCENT 2001M-C (PAD) ×3 IMPLANT
TRAY PACEMAKER INSERTION (PACKS) ×3 IMPLANT

## 2018-10-21 NOTE — Discharge Instructions (Signed)
Pacemaker Battery Change, Care After  This sheet gives you information about how to care for yourself after your procedure. Your health care provider may also give you more specific instructions. If you have problems or questions, contact your health care provider.  What can I expect after the procedure?  After your procedure, it is common to have:   Pain or soreness at the site where the pacemaker was inserted.   Swelling at the site where the pacemaker was inserted.  Follow these instructions at home:  Incision care     Keep the incision clean and dry.  ? Do not take baths, swim, or use a hot tub until your health care provider approves.  ? You may shower the day after your procedure, or as directed by your health care provider.  ? Pat the area dry with a clean towel. Do not rub the area. This may cause bleeding.   Follow instructions from your health care provider about how to take care of your incision. Make sure you:  ? Wash your hands with soap and water before you change your bandage (dressing). If soap and water are not available, use hand sanitizer.  ? Change your dressing as told by your health care provider.  ? Leave stitches (sutures), skin glue, or adhesive strips in place. These skin closures may need to stay in place for 2 weeks or longer. If adhesive strip edges start to loosen and curl up, you may trim the loose edges. Do not remove adhesive strips completely unless your health care provider tells you to do that.   Check your incision area every day for signs of infection. Check for:  ? More redness, swelling, or pain.  ? More fluid or blood.  ? Warmth.  ? Pus or a bad smell.  Activity   Do not lift anything that is heavier than 10 lb (4.5 kg) until your health care provider says it is okay to do so.   For the first 2 weeks, or as long as told by your health care provider:  ? Avoid lifting your left arm higher than your shoulder.  ? Be gentle when you move your arms over your head. It is okay  to raise your arm to comb your hair.  ? Avoid strenuous exercise.   Ask your health care provider when it is okay to:  ? Resume your normal activities.  ? Return to work or school.  ? Resume sexual activity.  Eating and drinking   Eat a heart-healthy diet. This should include plenty of fresh fruits and vegetables, whole grains, low-fat dairy products, and lean protein like chicken and fish.   Limit alcohol intake to no more than 1 drink a day for non-pregnant women and 2 drinks a day for men. One drink equals 12 oz of beer, 5 oz of wine, or 1 oz of hard liquor.   Check ingredients and nutrition facts on packaged foods and beverages. Avoid the following types of food:  ? Food that is high in salt (sodium).  ? Food that is high in saturated fat, like full-fat dairy or red meat.  ? Food that is high in trans fat, like fried food.  ? Food and drinks that are high in sugar.  Lifestyle   Do not use any products that contain nicotine or tobacco, such as cigarettes and e-cigarettes. If you need help quitting, ask your health care provider.   Take steps to manage and control your weight.     Get regular exercise. Aim for 150 minutes of moderate-intensity exercise (such as walking or yoga) or 75 minutes of vigorous exercise (such as running or swimming) each week.   Manage other health problems, such as diabetes or high blood pressure. Ask your health care provider how you can manage these conditions.  General instructions   Do not drive for 24 hours after your procedure if you were given a medicine to help you relax (sedative).   Take over-the-counter and prescription medicines only as told by your health care provider.   Avoid putting pressure on the area where the pacemaker was placed.   If you need an MRI after your pacemaker has been placed, be sure to tell the health care provider who orders the MRI that you have a pacemaker.   Avoid close and prolonged exposure to electrical devices that have strong  magnetic fields. These include:  ? Cell phones. Avoid keeping them in a pocket near the pacemaker, and try using the ear opposite the pacemaker.  ? MP3 players.  ? Household appliances, like microwaves.  ? Metal detectors.  ? Electric generators.  ? High-tension wires.   Keep all follow-up visits as directed by your health care provider. This is important.  Contact a health care provider if:   You have pain at the incision site that is not relieved by over-the-counter or prescription medicines.   You have any of these around your incision site or coming from it:  ? More redness, swelling, or pain.  ? Fluid or blood.  ? Warmth to the touch.  ? Pus or a bad smell.   You have a fever.   You feel brief, occasional palpitations, light-headedness, or any symptoms that you think might be related to your heart.  Get help right away if:   You experience chest pain that is different from the pain at the pacemaker site.   You develop a red streak that extends above or below the incision site.   You experience shortness of breath.   You have palpitations or an irregular heartbeat.   You have light-headedness that does not go away quickly.   You faint or have dizzy spells.   Your pulse suddenly drops or increases rapidly and does not return to normal.   You begin to gain weight and your legs and ankles swell.  Summary   After your procedure, it is common to have pain, soreness, and some swelling where the pacemaker was inserted.   Make sure to keep your incision clean and dry. Follow instructions from your health care provider about how to take care of your incision.   Check your incision every day for signs of infection, such as more pain or swelling, pus or a bad smell, warmth, or leaking fluid and blood.   Avoid strenuous exercise and lifting your left arm higher than your shoulder for 2 weeks, or as long as told by your health care provider.  This information is not intended to replace advice given to you by  your health care provider. Make sure you discuss any questions you have with your health care provider.  Document Released: 06/24/2013 Document Revised: 07/26/2016 Document Reviewed: 07/26/2016  Elsevier Interactive Patient Education  2019 [employer]

## 2018-10-21 NOTE — Interval H&P Note (Signed)
History and Physical Interval Note:  10/21/2018 3:15 PM  [name]  has presented today for surgery, with the diagnosis of End of Life  The various methods of treatment have been discussed with the patient and family. After consideration of risks, benefits and other options for treatment, the patient has consented to  Procedure(s): PPM GENERATOR CHANGEOUT (N/A) as a surgical intervention .  The patient's history has been reviewed, patient examined, no change in status, stable for surgery.  I have reviewed the patient's chart and labs.  Questions were answered to the patient's satisfaction.    Risks, benefits, and alternatives to PPM pulse generator replacement were discussed in detail today.  The patient understands that risks include but are not limited to bleeding, infection, pneumothorax, perforation, tamponade, vascular damage, renal failure, MI, stroke, death, damage to his existing leads, and lead dislodgement and wishes to proceed.    Thompson Grayer MD, [place] 10/21/2018 3:16 PM

## 2018-10-22 ENCOUNTER — Encounter (HOSPITAL_COMMUNITY): Payer: Self-pay | Admitting: Internal Medicine

## 2018-10-24 ENCOUNTER — Telehealth: Payer: Self-pay | Admitting: Internal Medicine

## 2018-10-24 NOTE — Telephone Encounter (Signed)
Spoke with patient's wife. Advised that outer occlusive dressing can be removed, but that Steri-strips need to stay in place until wound check or until they fall off on their own. She verbalizes understanding, reports she is unsure if patient will tolerate her removing the outer occlusive dressing as he has "thin skin." Advised it's fine if outer dressing isn't removed as we remove everything at wound check. She verbalizes understanding and thanked me for my call.

## 2018-10-24 NOTE — Telephone Encounter (Signed)
New Message    Patient has questions about removing bandages from surgery site.  Will like a nurse to call wife back to explain everything to her.

## 2018-10-25 LAB — CBC WITH DIFFERENTIAL/PLATELET
Basophils Absolute: 0 10*3/uL (ref 0.0–0.2)
Basos: 1 %
EOS (ABSOLUTE): 0.3 10*3/uL (ref 0.0–0.4)
Eos: 4 %
Hematocrit: 44.6 % (ref 37.5–51.0)
Hemoglobin: 14.9 g/dL (ref 13.0–17.7)
Immature Grans (Abs): 0.1 10*3/uL (ref 0.0–0.1)
Immature Granulocytes: 1 %
Lymphocytes Absolute: 1.8 10*3/uL (ref 0.7–3.1)
Lymphs: 25 %
MCH: 29.3 pg (ref 26.6–33.0)
MCHC: 33.4 g/dL (ref 31.5–35.7)
MCV: 88 fL (ref 79–97)
Monocytes Absolute: 0.8 10*3/uL (ref 0.1–0.9)
Monocytes: 11 %
Neutrophils Absolute: 4.3 10*3/uL (ref 1.4–7.0)
Neutrophils: 58 %
Platelets: 237 10*3/uL (ref 150–450)
RBC: 5.09 x10E6/uL (ref 4.14–5.80)
RDW: 13.2 % (ref 11.6–15.4)
WBC: 7.3 10*3/uL (ref 3.4–10.8)

## 2018-10-25 LAB — SPECIMEN STATUS REPORT

## 2018-11-05 ENCOUNTER — Ambulatory Visit (INDEPENDENT_AMBULATORY_CARE_PROVIDER_SITE_OTHER): Payer: Medicare Other | Admitting: Nurse Practitioner

## 2018-11-05 DIAGNOSIS — R001 Bradycardia, unspecified: Secondary | ICD-10-CM | POA: Diagnosis not present

## 2018-11-05 LAB — CUP PACEART INCLINIC DEVICE CHECK
Date Time Interrogation Session: 20200219121127
Implantable Lead Implant Date: 20100112
Implantable Lead Implant Date: 20100112
Implantable Lead Location: 753859
Implantable Lead Location: 753860
Implantable Pulse Generator Implant Date: 20200204
Pulse Gen Model: 1272
Pulse Gen Serial Number: 9077470

## 2018-11-05 NOTE — Progress Notes (Signed)
Wound check appointment. Steri-strips removed. Wound without redness or edema. Incision edges approximated, wound well healed. Normal device function. Thresholds, sensing, and impedances consistent with implant measurements. Autocapture with false high readings. Output set today to 2.5V@0 .90msec. Threshold 0.75V@0 .58msec.  Histogram distribution appropriate for patient and level of activity. No mode switches or high ventricular rates noted. Patient educated about wound care, arm mobility, lifting restrictions. ROV in 3 months with implanting physician.

## 2018-11-11 LAB — BASIC METABOLIC PANEL
BUN/Creatinine Ratio: 14 (ref 10–24)
BUN: 17 mg/dL (ref 8–27)
CO2: 22 mmol/L (ref 20–29)
Calcium: 9 mg/dL (ref 8.6–10.2)
Chloride: 107 mmol/L — ABNORMAL HIGH (ref 96–106)
Creatinine, Ser: 1.2 mg/dL (ref 0.76–1.27)
GFR calc Af Amer: 63 mL/min/{1.73_m2} (ref >59)
GFR calc non Af Amer: 55 mL/min/{1.73_m2} — ABNORMAL LOW (ref >59)
Glucose: 96 mg/dL (ref 65–99)
Potassium: 5.3 mmol/L — ABNORMAL HIGH (ref 3.5–5.2)
Sodium: 143 mmol/L (ref 134–144)

## 2018-11-29 ENCOUNTER — Other Ambulatory Visit: Payer: Self-pay

## 2018-11-29 ENCOUNTER — Emergency Department (HOSPITAL_COMMUNITY): Payer: Medicare Other

## 2018-11-29 ENCOUNTER — Observation Stay (HOSPITAL_COMMUNITY)
Admission: EM | Admit: 2018-11-29 | Discharge: 2018-12-01 | Disposition: A | Discharge status: Home / Self Care | Payer: Medicare Other | Attending: Internal Medicine | Admitting: Internal Medicine

## 2018-11-29 DIAGNOSIS — D72829 Elevated white blood cell count, unspecified: Secondary | ICD-10-CM | POA: Insufficient documentation

## 2018-11-29 DIAGNOSIS — I110 Hypertensive heart disease with heart failure: Secondary | ICD-10-CM | POA: Diagnosis not present

## 2018-11-29 DIAGNOSIS — Z79899 Other long term (current) drug therapy: Secondary | ICD-10-CM | POA: Diagnosis not present

## 2018-11-29 DIAGNOSIS — I495 Sick sinus syndrome: Secondary | ICD-10-CM | POA: Diagnosis not present

## 2018-11-29 DIAGNOSIS — I2510 Atherosclerotic heart disease of native coronary artery without angina pectoris: Secondary | ICD-10-CM | POA: Insufficient documentation

## 2018-11-29 DIAGNOSIS — Z7901 Long term (current) use of anticoagulants: Secondary | ICD-10-CM | POA: Insufficient documentation

## 2018-11-29 DIAGNOSIS — Z885 Allergy status to narcotic agent status: Secondary | ICD-10-CM | POA: Insufficient documentation

## 2018-11-29 DIAGNOSIS — G20 Parkinson's disease: Secondary | ICD-10-CM | POA: Diagnosis not present

## 2018-11-29 DIAGNOSIS — R102 Pelvic and perineal pain: Secondary | ICD-10-CM | POA: Diagnosis not present

## 2018-11-29 DIAGNOSIS — I5032 Chronic diastolic (congestive) heart failure: Secondary | ICD-10-CM | POA: Insufficient documentation

## 2018-11-29 DIAGNOSIS — Z950 Presence of cardiac pacemaker: Secondary | ICD-10-CM | POA: Diagnosis present

## 2018-11-29 DIAGNOSIS — G20A1 Parkinson's disease without dyskinesia, without mention of fluctuations: Secondary | ICD-10-CM | POA: Diagnosis present

## 2018-11-29 DIAGNOSIS — K219 Gastro-esophageal reflux disease without esophagitis: Secondary | ICD-10-CM | POA: Diagnosis not present

## 2018-11-29 DIAGNOSIS — Z8249 Family history of ischemic heart disease and other diseases of the circulatory system: Secondary | ICD-10-CM | POA: Insufficient documentation

## 2018-11-29 DIAGNOSIS — I10 Essential (primary) hypertension: Secondary | ICD-10-CM | POA: Diagnosis present

## 2018-11-29 DIAGNOSIS — I4821 Permanent atrial fibrillation: Secondary | ICD-10-CM | POA: Diagnosis not present

## 2018-11-29 DIAGNOSIS — Z87891 Personal history of nicotine dependence: Secondary | ICD-10-CM | POA: Insufficient documentation

## 2018-11-29 DIAGNOSIS — Z9049 Acquired absence of other specified parts of digestive tract: Secondary | ICD-10-CM | POA: Insufficient documentation

## 2018-11-29 LAB — CBC WITH DIFFERENTIAL/PLATELET
Abs Immature Granulocytes: 0.07 10*3/uL (ref 0.00–0.07)
Basophils Absolute: 0 10*3/uL (ref 0.0–0.1)
Basophils Relative: 0 %
Eosinophils Absolute: 0.2 10*3/uL (ref 0.0–0.5)
Eosinophils Relative: 2 %
HCT: 52.1 % — ABNORMAL HIGH (ref 39.0–52.0)
Hemoglobin: 16.2 g/dL (ref 13.0–17.0)
Immature Granulocytes: 0 %
Lymphocytes Relative: 11 %
Lymphs Abs: 1.7 10*3/uL (ref 0.7–4.0)
MCH: 29 pg (ref 26.0–34.0)
MCHC: 31.1 g/dL (ref 30.0–36.0)
MCV: 93.4 fL (ref 80.0–100.0)
MONO ABS: 1 10*3/uL (ref 0.1–1.0)
Monocytes Relative: 6 %
Neutro Abs: 12.7 10*3/uL — ABNORMAL HIGH (ref 1.7–7.7)
Neutrophils Relative %: 81 %
Platelets: 226 10*3/uL (ref 150–400)
RBC: 5.58 MIL/uL (ref 4.22–5.81)
RDW: 13.6 % (ref 11.5–15.5)
WBC: 15.7 10*3/uL — ABNORMAL HIGH (ref 4.0–10.5)
nRBC: 0 % (ref 0.0–0.2)

## 2018-11-29 LAB — COMPREHENSIVE METABOLIC PANEL
ALT: <5 U/L (ref 0–44)
AST: 20 U/L (ref 15–41)
Albumin: 3.7 g/dL (ref 3.5–5.0)
Alkaline Phosphatase: 95 U/L (ref 38–126)
Anion gap: 7 (ref 5–15)
BUN: 17 mg/dL (ref 8–23)
CO2: 25 mmol/L (ref 22–32)
CREATININE: 1.13 mg/dL (ref 0.61–1.24)
Calcium: 8.9 mg/dL (ref 8.9–10.3)
Chloride: 104 mmol/L (ref 98–111)
GFR calc Af Amer: >60 mL/min (ref >60)
GFR calc non Af Amer: 59 mL/min — ABNORMAL LOW (ref >60)
Glucose, Bld: 104 mg/dL — ABNORMAL HIGH (ref 70–99)
Potassium: 4.1 mmol/L (ref 3.5–5.1)
Sodium: 136 mmol/L (ref 135–145)
Total Bilirubin: 1.1 mg/dL (ref 0.3–1.2)
Total Protein: 7.1 g/dL (ref 6.5–8.1)

## 2018-11-29 LAB — I-STAT CREATININE, ED: Creatinine, Ser: 1.1 mg/dL (ref 0.61–1.24)

## 2018-11-29 LAB — LIPASE, BLOOD: Lipase: 26 U/L (ref 11–51)

## 2018-11-29 MED ORDER — IOHEXOL 300 MG/ML  SOLN
100.0000 mL | Freq: Once | INTRAMUSCULAR | Status: AC | PRN
  Administered 2018-11-29: 100 mL via INTRAVENOUS

## 2018-11-29 MED ORDER — SODIUM CHLORIDE 0.9 % IV BOLUS
1000.0000 mL | Freq: Once | INTRAVENOUS | Status: AC
  Administered 2018-11-29: 1000 mL via INTRAVENOUS

## 2018-11-29 MED ORDER — MORPHINE SULFATE (PF) 2 MG/ML IV SOLN: 2.0000 mg | Freq: Once | INTRAVENOUS | Status: DC

## 2018-11-29 MED ORDER — ONDANSETRON HCL 4 MG/2ML IJ SOLN
4.0000 mg | Freq: Once | INTRAMUSCULAR | Status: AC
  Administered 2018-11-29: 4 mg via INTRAVENOUS
  Filled 2018-11-29: qty 2

## 2018-11-29 NOTE — ED Provider Notes (Signed)
[facility] EMERGENCY DEPARTMENT Provider Note   CSN: [id number] Arrival date & time: 11/29/18  2116    History   Chief Complaint Chief Complaint  Patient presents with   Abdominal Pain    HPI [name] is a 85 y.o. male.     With a chief complaint of pain.  This is at the base of his penis.  Describes it as sharp.  Has been going off and on for about 7 hours now.  Seems to come and go.  Nothing seems to make it better or worse.  Denies fevers denies diarrhea denies cough or congestion.  He denies trauma to the area.  Denies mass or bulging.  Denies testicular or penile pain.  Denies prior issues with his prostate.  Has had kidney stones but remotely in the past is not sure if this feels the same.  The history is provided by the patient.  Abdominal Pain  Associated symptoms: no chest pain, no chills, no diarrhea, no fever, no shortness of breath and no vomiting   Illness  Severity:  Moderate Onset quality:  Sudden Duration:  2 days Timing:  Constant Progression:  Worsening Chronicity:  New Associated symptoms: abdominal pain   Associated symptoms: no chest pain, no congestion, no diarrhea, no fever, no headaches, no myalgias, no rash, no shortness of breath and no vomiting     Past Medical History:  Diagnosis Date   CAD (coronary artery disease) 09/2008   Cancer ([place])    basal cell carcinoma   CHF (congestive heart failure) (HCC)    Complication of anesthesia    06/12/11 anesthesia made legs asleep also and went into chf and ended up in ICU after gallbladder surgery   Diastolic dysfunction    hx of chf due to fluid overload 9/12    Dysrhythmia    atrial fibrillation   GERD (gastroesophageal reflux disease)    History of blood product transfusion    d/t bleeding ulcer   Hypertension    Macular degeneration (senile) of retina    Persistent atrial fibrillation    PONV (postoperative nausea and vomiting)    Presence of permanent  cardiac pacemaker 2010   Shortness of breath dyspnea    exertion   Sick sinus syndrome (HCC)    St. Jude   Syncope    resolved s/p PPM    Patient Active Problem List   Diagnosis Date Noted   Intracranial aneurysm 11/28/2016   Gait disturbance 11/06/2016   Muscle fatigue 11/06/2016   Post herpetic neuralgia 11/06/2016   Left inguinal hernia 10/06/2014   Shortness of breath 01/03/2014   Pacemaker-St.Jude 07/09/2012   Permanent atrial fibrillation 10/20/2010   HYPERLIPIDEMIA-MIXED 10/26/2009   Macular degeneration 12/28/2008   CAD, NATIVE VESSEL 12/28/2008   GERD 12/28/2008   SYNCOPE 12/28/2008   Hypertension 12/28/2008   Sick sinus syndrome ([place]) 12/28/2008    Past Surgical History:  Procedure Laterality Date   CHOLECYSTECTOMY  06/12/11   INGUINAL HERNIA REPAIR Left 10/06/2014   Procedure: OPEN REPAIR LEFT INGUINAL HERNIA ;  Surgeon: Fanny Skates, MD;  Location: [place];  Service: General;  Laterality: Left;   INSERTION OF MESH Left 10/06/2014   Procedure: INSERTION OF MESH;  Surgeon: Fanny Skates, MD;  Location: [place];  Service: General;  Laterality: Left;   IR KYPHO LUMBAR INC FX REDUCE BONE BX UNI/BIL CANNULATION INC/IMAGING  12/26/2017   IR RADIOLOGIST EVAL & MGMT  12/12/2017   LAPAROSCOPIC CHOLECYSTECTOMY W/ CHOLANGIOGRAPHY  06/12/2011   PACEMAKER INSERTION  09/28/08   SJM [place] XL DR   perforated ulcer     PPM GENERATOR CHANGEOUT N/A 10/21/2018   Procedure: PPM GENERATOR CHANGEOUT;  Surgeon: Thompson Grayer, MD;  Location: [place] [facility];  Service: Cardiovascular;  Laterality: N/A;   right inguinal hernia repair  11/19/2002   with mesh   TRANSURETHRAL RESECTION OF BLADDER TUMOR  09/06/2011   Procedure: TRANSURETHRAL RESECTION OF BLADDER TUMOR (TURBT);  Surgeon: Ailene Rud, MD;  Location: [facility];  Service: Urology;  Laterality: N/A;   TRANSURETHRAL RESECTION OF PROSTATE  44/96/7591   umblical hernia  6/38/4665        Home  Medications    Prior to Admission medications   Medication Sig Start Date End Date Taking? Authorizing Provider  carbidopa-levodopa (SINEMET) 25-100 MG tablet Take 1 tablet by mouth 3 (three) times daily. Patient taking differently: Take 1 tablet by mouth 3 (three) times daily. (0900, 1400, 2000) 06/14/17  Yes Nanavati, Ankit, MD  Cholecalciferol (VITAMIN D3) 50 MCG (2000 UT) TABS Take 2,000 Units by mouth daily.   Yes [provider]  ELIQUIS 5 MG TABS tablet TAKE 1 TABLET TWICE A DAY Patient taking differently: Take 5 mg by mouth 2 (two) times daily.  10/13/18  Yes Allred, Jeneen Rinks, MD  famotidine (PEPCID) 20 MG tablet Take 20 mg by mouth daily.   Yes [provider]  Magnesium 250 MG TABS Take 250-500 mg by mouth See admin instructions. Take 1 tablet (250 mg) by mouth on day 1, then take 2 tablets (500 mg) by mouth on day 2 (alternate every other day)   Yes [provider]  triamcinolone cream (KENALOG) 0.5 % Apply 1 application topically 2 (two) times daily as needed (for PHN).  08/19/18  Yes [provider]    Family History Family History  Problem Relation Age of Onset   Heart attack Mother 93       MI   Heart failure Father    Heart attack Brother        Late 70s   Cancer Sister     Social History Social History   Tobacco Use   Smoking status: Former Smoker    Last attempt to quit: 09/17/1958    Years since quitting: 60.2   Smokeless tobacco: Never Used   Tobacco comment: quit 1968  Substance Use Topics   Alcohol use: No   Drug use: No     Allergies   Hydrocodone and Hydroxyzine   Review of Systems Review of Systems  Constitutional: Negative for chills and fever.  HENT: Negative for congestion and facial swelling.   Eyes: Negative for discharge and visual disturbance.  Respiratory: Negative for shortness of breath.   Cardiovascular: Negative for chest pain and palpitations.  Gastrointestinal: Positive for abdominal pain.  Negative for diarrhea and vomiting.  Musculoskeletal: Negative for arthralgias and myalgias.  Skin: Negative for color change and rash.  Neurological: Negative for tremors, syncope and headaches.  Psychiatric/Behavioral: Negative for confusion and dysphoric mood.     Physical Exam Updated Vital Signs BP 111/69    Pulse (!) 59    Temp 97.9 F (36.6 C) (Oral)    Resp 16    Ht 5\' 11"  (1.803 m)    Wt 78.5 kg    SpO2 92%    BMI 24.13 kg/m   Physical Exam Vitals signs and nursing note reviewed.  Constitutional:      Appearance: He is well-developed.  HENT:     Head: Normocephalic and atraumatic.  Eyes:     Pupils: Pupils are equal, round, and reactive to light.  Neck:     Musculoskeletal: Normal range of motion and neck supple.     Vascular: No JVD.  Cardiovascular:     Rate and Rhythm: Normal rate and regular rhythm.     Heart sounds: No murmur. No friction rub. No gallop.   Pulmonary:     Effort: No respiratory distress.     Breath sounds: No wheezing.  Abdominal:     General: There is distension.     Tenderness: There is no guarding or rebound.     Comments: Mild chronic per family  Genitourinary:    Comments: Pain with palpation to the pelvis at the base of the penis.  There is no penile pain there is no pain to either testicle there is no appreciable hernia in either inguinal canal.  He has mild tenderness to the pubic bone bilaterally. Musculoskeletal: Normal range of motion.  Skin:    Coloration: Skin is not pale.     Findings: No rash.  Neurological:     Mental Status: He is alert and oriented to person, place, and time.  Psychiatric:        Behavior: Behavior normal.      ED Treatments / Results  Labs (all labs ordered are listed, but only abnormal results are displayed) Labs Reviewed  CBC WITH DIFFERENTIAL/PLATELET - Abnormal; Notable for the following components:      Result Value   WBC 15.7 (*)    HCT 52.1 (*)    Neutro Abs 12.7 (*)    All other  components within normal limits  COMPREHENSIVE METABOLIC PANEL - Abnormal; Notable for the following components:   Glucose, Bld 104 (*)    GFR calc non Af Amer 59 (*)    All other components within normal limits  URINE CULTURE  LIPASE, BLOOD  URINALYSIS, ROUTINE W REFLEX MICROSCOPIC  I-STAT CREATININE, ED    EKG None  Radiology Ct Abdomen Pelvis W Contrast  Result Date: 11/29/2018 CLINICAL DATA:  Lower abdominal pain since 3:30 p.m. today. EXAM: CT ABDOMEN AND PELVIS WITH CONTRAST TECHNIQUE: Multidetector CT imaging of the abdomen and pelvis was performed using the standard protocol following bolus administration of intravenous contrast. CONTRAST:  13mL OMNIPAQUE IOHEXOL 300 MG/ML  SOLN COMPARISON:  08/03/2018. FINDINGS: Lower chest: Stable bibasilar bullous changes and small area of consolidation laterally on the right. No pleural fluid. Mildly enlarged heart. Intracardiac pacer wires. Hepatobiliary: Cholecystectomy clips. Stable small cyst in the dome of the liver on the right. Pancreas: Unremarkable. No pancreatic ductal dilatation or surrounding inflammatory changes. Spleen: Normal in size without focal abnormality. Adrenals/Urinary Tract: Normal appearing adrenal glands. 2 mm lower pole left renal calculus. Tiny lower pole right renal calculus. Small bilateral renal cysts. No bladder or ureteral calculi. The anterior, inferior portion of the urinary bladder is extending into a small proximal right inguinal hernia. Stomach/Bowel: A distal gastric staple line is again demonstrated with marked luminal narrowing. There is retained fluid and gas in the proximal and distal portions of the stomach, before and after the staple line. Multiple mildly dilated loops of proximal small bowel with normal caliber distal small bowel loops. There is swirling of the mesentery and appearance of the bowel loop suggesting an internal hernia in the right lower abdomen and pelvis. Large number of sigmoid, descending  and distal transverse colon diverticula without evidence of diverticulitis.  No evidence of appendicitis. Vascular/Lymphatic: Atheromatous arterial calcifications without aneurysm. No enlarged lymph nodes. Reproductive: Mildly enlarged prostate gland containing coarse calcifications. Other: Small proximal right inguinal hernia containing a portion of herniated urinary bladder. There is also a moderate-sized supraumbilical hernia to the left midline containing fat. Musculoskeletal: Lumbar and lower thoracic spine degenerative changes. Stable 50% L1 vertebral compression deformity with kyphoplasty material. IMPRESSION: 1. Partial small bowel obstruction. This could be due to a large internal hernia in the right lower abdomen and pelvis. This could also be due to an adhesion. 2. Extensive colonic diverticulosis without evidence of diverticulitis. 3. Small proximal right inguinal hernia containing a portion of herniated urinary bladder. 4. Moderate-sized supraumbilical hernia to the left of midline containing fat. 5. Changes of COPD at the lung bases. Electronically Signed   By: Claudie Revering M.D.   On: 11/29/2018 23:17    Procedures Procedures (including critical care time)  Medications Ordered in ED Medications  morphine 2 MG/ML injection 2 mg (2 mg Intravenous Not Given 11/29/18 2221)  morphine 2 MG/ML injection 2 mg (0 mg Intravenous Hold 11/29/18 2353)  sodium chloride 0.9 % bolus 1,000 mL (0 mLs Intravenous Stopped 11/29/18 2353)  ondansetron (ZOFRAN) injection 4 mg (4 mg Intravenous Given 11/29/18 2159)  iohexol (OMNIPAQUE) 300 MG/ML solution 100 mL (100 mLs Intravenous Contrast Given 11/29/18 2232)     Initial Impression / Assessment and Plan / ED Course  I have reviewed the triage vital signs and the nursing notes.  Pertinent labs & imaging results that were available during my care of the patient were reviewed by me and considered in my medical decision making (see chart for details).         85 yo M with a cc of pain at the base of his penis.  No obvious cause on exam.  Will obtain labs, ua, ct.   CT scan with possible partial small bowel obstruction.  Patient having no episodes of vomiting though is complaining of some diarrhea today.  I discussed the case with Dr. Rosendo Gros, general surgery based on my description of the history and my physical exam he felt this was unlikely to be a small bowel obstruction.  With the patient's leukocytosis and ongoing pain will discuss with the hospitalist for observation.  The patients results and plan were reviewed and discussed.   Any x-rays performed were independently reviewed by myself.   Differential diagnosis were considered with the presenting HPI.  Medications  morphine 2 MG/ML injection 2 mg (2 mg Intravenous Not Given 11/29/18 2221)  morphine 2 MG/ML injection 2 mg (0 mg Intravenous Hold 11/29/18 2353)  sodium chloride 0.9 % bolus 1,000 mL (0 mLs Intravenous Stopped 11/29/18 2353)  ondansetron (ZOFRAN) injection 4 mg (4 mg Intravenous Given 11/29/18 2159)  iohexol (OMNIPAQUE) 300 MG/ML solution 100 mL (100 mLs Intravenous Contrast Given 11/29/18 2232)    Vitals:   11/29/18 2144 11/29/18 2145 11/29/18 2230 11/29/18 2330  BP: 132/78 138/84 140/69 111/69  Pulse:  60 61 (!) 59  Resp:  (!) 23 19 16   Temp:      TempSrc:      SpO2:  98% 96% 92%  Weight:      Height:        Final diagnoses:  Pelvic pain    Admission/ observation were discussed with the admitting physician, patient and/or family and they are comfortable with the plan.    Final Clinical Impressions(s) / ED Diagnoses   Final diagnoses:  Pelvic  pain    ED Discharge Orders    None       Deno Etienne, [place] 11/29/18 2359

## 2018-11-29 NOTE — ED Triage Notes (Signed)
[facility] reports: Patient complains of lower abdominal pain since about 3:30 pm today. He has a hx of diverticulitis, kidney stones, abdominal hernia and parkinson's. He has a pacemaker.

## 2018-11-30 DIAGNOSIS — R102 Pelvic and perineal pain: Secondary | ICD-10-CM

## 2018-11-30 DIAGNOSIS — G20 Parkinson's disease: Secondary | ICD-10-CM | POA: Diagnosis present

## 2018-11-30 LAB — COMPREHENSIVE METABOLIC PANEL
ALT: 9 U/L (ref 0–44)
ANION GAP: 7 (ref 5–15)
AST: 23 U/L (ref 15–41)
Albumin: 2.9 g/dL — ABNORMAL LOW (ref 3.5–5.0)
Alkaline Phosphatase: 79 U/L (ref 38–126)
BUN: 15 mg/dL (ref 8–23)
CO2: 23 mmol/L (ref 22–32)
Calcium: 7.8 mg/dL — ABNORMAL LOW (ref 8.9–10.3)
Chloride: 106 mmol/L (ref 98–111)
Creatinine, Ser: 1.02 mg/dL (ref 0.61–1.24)
GFR calc Af Amer: >60 mL/min (ref >60)
GFR calc non Af Amer: >60 mL/min (ref >60)
Glucose, Bld: 123 mg/dL — ABNORMAL HIGH (ref 70–99)
Potassium: 3.9 mmol/L (ref 3.5–5.1)
Sodium: 136 mmol/L (ref 135–145)
Total Bilirubin: 0.9 mg/dL (ref 0.3–1.2)
Total Protein: 6.2 g/dL — ABNORMAL LOW (ref 6.5–8.1)

## 2018-11-30 LAB — URINALYSIS, ROUTINE W REFLEX MICROSCOPIC
Bilirubin Urine: NEGATIVE
Glucose, UA: NEGATIVE mg/dL
Hgb urine dipstick: NEGATIVE
Ketones, ur: 5 mg/dL — AB
Leukocytes,Ua: NEGATIVE
Nitrite: NEGATIVE
Protein, ur: NEGATIVE mg/dL
Specific Gravity, Urine: >1.046 — ABNORMAL HIGH (ref 1.005–1.030)
pH: 5 (ref 5.0–8.0)

## 2018-11-30 LAB — CBC
HCT: 43 % (ref 39.0–52.0)
Hemoglobin: 13.9 g/dL (ref 13.0–17.0)
MCH: 29.7 pg (ref 26.0–34.0)
MCHC: 32.3 g/dL (ref 30.0–36.0)
MCV: 91.9 fL (ref 80.0–100.0)
Platelets: 195 10*3/uL (ref 150–400)
RBC: 4.68 MIL/uL (ref 4.22–5.81)
RDW: 14 % (ref 11.5–15.5)
WBC: 13.9 10*3/uL — ABNORMAL HIGH (ref 4.0–10.5)
nRBC: 0 % (ref 0.0–0.2)

## 2018-11-30 LAB — MRSA PCR SCREENING: MRSA by PCR: POSITIVE — AB

## 2018-11-30 MED ORDER — FAMOTIDINE 20 MG PO TABS
20.0000 mg | ORAL_TABLET | Freq: Every day | ORAL | Status: DC
  Administered 2018-11-30 – 2018-12-01 (×2): 20 mg via ORAL
  Filled 2018-11-30 (×2): qty 1

## 2018-11-30 MED ORDER — SODIUM CHLORIDE 0.9 % IV SOLN
1.0000 g | Freq: Every day | INTRAVENOUS | Status: DC
  Administered 2018-11-30 (×2): 1 g via INTRAVENOUS
  Filled 2018-11-30: qty 10

## 2018-11-30 MED ORDER — ONDANSETRON HCL 4 MG/2ML IJ SOLN: 4.0000 mg | Freq: Four times a day (QID) | INTRAMUSCULAR | Status: DC | PRN

## 2018-11-30 MED ORDER — TRAMADOL HCL 50 MG PO TABS: 50.0000 mg | ORAL_TABLET | Freq: Four times a day (QID) | ORAL | Status: DC | PRN

## 2018-11-30 MED ORDER — APIXABAN 5 MG PO TABS
5.0000 mg | ORAL_TABLET | Freq: Two times a day (BID) | ORAL | Status: DC
  Administered 2018-11-30 – 2018-12-01 (×3): 5 mg via ORAL
  Filled 2018-11-30 (×3): qty 1

## 2018-11-30 MED ORDER — ONDANSETRON HCL 4 MG PO TABS: 4.0000 mg | ORAL_TABLET | Freq: Four times a day (QID) | ORAL | Status: DC | PRN

## 2018-11-30 MED ORDER — BISACODYL 10 MG RE SUPP: 10.0000 mg | Freq: Every day | RECTAL | Status: DC | PRN

## 2018-11-30 MED ORDER — VITAMIN D 25 MCG (1000 UNIT) PO TABS
2000.0000 [IU] | ORAL_TABLET | Freq: Every day | ORAL | Status: DC
  Administered 2018-11-30 – 2018-12-01 (×2): 2000 [IU] via ORAL
  Filled 2018-11-30 (×2): qty 2

## 2018-11-30 MED ORDER — CARBIDOPA-LEVODOPA 25-100 MG PO TABS
1.0000 | ORAL_TABLET | Freq: Three times a day (TID) | ORAL | Status: DC
  Administered 2018-11-30 – 2018-12-01 (×4): 1 via ORAL
  Filled 2018-11-30 (×5): qty 1

## 2018-11-30 MED ORDER — SODIUM CHLORIDE 0.45 % IV SOLN
INTRAVENOUS | Status: DC
  Administered 2018-11-30 (×2): via INTRAVENOUS

## 2018-11-30 MED ORDER — TRIAMCINOLONE ACETONIDE 0.5 % EX CREA
TOPICAL_CREAM | Freq: Two times a day (BID) | CUTANEOUS | Status: DC | PRN
  Administered 2018-11-30: 02:00:00 via TOPICAL
  Filled 2018-11-30: qty 15

## 2018-11-30 NOTE — ED Notes (Signed)
ED TO INPATIENT HANDOFF REPORT  ED Nurse Name and Phone #: Petra Kuba [phone]  S Name/Age/Gender [name] 85 y.o. male Room/Bed: 020C/020C  Code Status   Code Status: Prior  Home/SNF/Other Home Patient oriented to: self, place, time and situation Is this baseline? Yes   Triage Complete: Triage complete  Chief Complaint abd pain  Triage Note [facility] reports: Patient complains of lower abdominal pain since about 3:30 pm today. He has a hx of diverticulitis, kidney stones, abdominal hernia and parkinson's. He has a pacemaker.    Allergies Allergies  Allergen Reactions  . Hydrocodone     "Made him feel bad"  . Hydroxyzine Anxiety    Level of Care/Admitting Diagnosis ED Disposition    ED Disposition Condition [facility] Area: [place] [100100]  Level of Care: Med-Surg [16]  I expect the patient will be discharged within 24 hours: Yes  LOW acuity---Tx typically complete <24 hrs---ACUTE conditions typically can be evaluated <24 hours---LABS likely to return to acceptable levels <24 hours---IS near functional baseline---EXPECTED to return to current living arrangement---NOT newly hypoxic: Meets criteria for 5C-Observation unit  Diagnosis: Pelvic pain [381017]  Admitting Physician: Elwyn Reach [[id number]]  Attending Physician: Elwyn Reach [[id number]]  PT Class (Do Not Modify): Observation [104]  PT Acc Code (Do Not Modify): Observation [10022]       B Medical/Surgery History Past Medical History:  Diagnosis Date  . CAD (coronary artery disease) 09/2008  . Cancer ([facility])    basal cell carcinoma  . CHF (congestive heart failure) ([facility])   . Complication of anesthesia    06/12/11 anesthesia made legs asleep also and went into chf and ended up in ICU after gallbladder surgery  . Diastolic dysfunction    hx of chf due to fluid overload 9/12   . Dysrhythmia    atrial fibrillation  . GERD (gastroesophageal reflux disease)   .  History of blood product transfusion    d/t bleeding ulcer  . Hypertension   . Macular degeneration (senile) of retina   . Persistent atrial fibrillation   . PONV (postoperative nausea and vomiting)   . Presence of permanent cardiac pacemaker 2010  . Shortness of breath dyspnea    exertion  . Sick sinus syndrome (HCC)    St. Jude  . Syncope    resolved s/p PPM   Past Surgical History:  Procedure Laterality Date  . CHOLECYSTECTOMY  06/12/11  . INGUINAL HERNIA REPAIR Left 10/06/2014   Procedure: OPEN REPAIR LEFT INGUINAL HERNIA ;  Surgeon: Fanny Skates, MD;  Location: [facility];  Service: General;  Laterality: Left;  . INSERTION OF MESH Left 10/06/2014   Procedure: INSERTION OF MESH;  Surgeon: Fanny Skates, MD;  Location: [facility];  Service: General;  Laterality: Left;  . IR KYPHO LUMBAR INC FX REDUCE BONE BX UNI/BIL CANNULATION INC/IMAGING  12/26/2017  . IR RADIOLOGIST EVAL & MGMT  12/12/2017  . LAPAROSCOPIC CHOLECYSTECTOMY W/ CHOLANGIOGRAPHY  06/12/2011  . PACEMAKER INSERTION  09/28/08   SJM [place] XL DR  . perforated ulcer    . PPM GENERATOR CHANGEOUT N/A 10/21/2018   Procedure: PPM GENERATOR CHANGEOUT;  Surgeon: Thompson Grayer, MD;  Location: [facility];  Service: Cardiovascular;  Laterality: N/A;  . right inguinal hernia repair  11/19/2002   with mesh  . TRANSURETHRAL RESECTION OF BLADDER TUMOR  09/06/2011   Procedure: TRANSURETHRAL RESECTION OF BLADDER TUMOR (TURBT);  Surgeon: Ailene Rud, MD;  Location:  [facility];  Service: Urology;  Laterality: N/A;  . TRANSURETHRAL RESECTION OF PROSTATE  50/05/3817  . umblical hernia  2/99/3716     A IV Location/Drains/Wounds Patient Lines/Drains/Airways Status   Active Line/Drains/Airways    Name:   Placement date:   Placement time:   Site:   Days:   Peripheral IV 11/29/18 Right Forearm   11/29/18    2124    Forearm   1   Incision (Closed) 12/26/17 Back Medial   12/26/17    1350     339          Intake/Output Last 24  hours  Intake/Output Summary (Last 24 hours) at 11/30/2018 0015 Last data filed at 11/29/2018 2353 Gross per 24 hour  Intake 1000 ml  Output -  Net 1000 ml    Labs/Imaging Results for orders placed or performed during the hospital encounter of 11/29/18 (from the past 48 hour(s))  CBC with Differential     Status: Abnormal   Collection Time: 11/29/18  9:39 PM  Result Value Ref Range   WBC 15.7 (H) 4.0 - 10.5 K/uL   RBC 5.58 4.22 - 5.81 MIL/uL   Hemoglobin 16.2 13.0 - 17.0 g/dL   HCT 52.1 (H) 39.0 - 52.0 %   MCV 93.4 80.0 - 100.0 fL   MCH 29.0 26.0 - 34.0 pg   MCHC 31.1 30.0 - 36.0 g/dL   RDW 13.6 11.5 - 15.5 %   Platelets 226 150 - 400 K/uL   nRBC 0.0 0.0 - 0.2 %   Neutrophils Relative % 81 %   Neutro Abs 12.7 (H) 1.7 - 7.7 K/uL   Lymphocytes Relative 11 %   Lymphs Abs 1.7 0.7 - 4.0 K/uL   Monocytes Relative 6 %   Monocytes Absolute 1.0 0.1 - 1.0 K/uL   Eosinophils Relative 2 %   Eosinophils Absolute 0.2 0.0 - 0.5 K/uL   Basophils Relative 0 %   Basophils Absolute 0.0 0.0 - 0.1 K/uL   Immature Granulocytes 0 %   Abs Immature Granulocytes 0.07 0.00 - 0.07 K/uL    Comment: Performed at [facility], [address] [place], [place] [address]  Comprehensive metabolic panel     Status: Abnormal   Collection Time: 11/29/18  9:39 PM  Result Value Ref Range   Sodium 136 135 - 145 mmol/L   Potassium 4.1 3.5 - 5.1 mmol/L   Chloride 104 98 - 111 mmol/L   CO2 25 22 - 32 mmol/L   Glucose, Bld 104 (H) 70 - 99 mg/dL   BUN 17 8 - 23 mg/dL   Creatinine, Ser 1.13 0.61 - 1.24 mg/dL   Calcium 8.9 8.9 - 10.3 mg/dL   Total Protein 7.1 6.5 - 8.1 g/dL   Albumin 3.7 3.5 - 5.0 g/dL   AST 20 15 - 41 U/L   ALT <5 0 - 44 U/L   Alkaline Phosphatase 95 38 - 126 U/L   Total Bilirubin 1.1 0.3 - 1.2 mg/dL   GFR calc non Af Amer 59 (L) >60 mL/min   GFR calc Af Amer >60 >60 mL/min   Anion gap 7 5 - 15    Comment: Performed at [facility] [address], [place], [place] [address]   Lipase, blood     Status: None   Collection Time: 11/29/18  9:39 PM  Result Value Ref Range   Lipase 26 11 - 51 U/L    Comment: Performed at [facility], [address] [place], [place] [address]  I-Stat Creatinine, ED (not at [facility])     Status: None   Collection Time: 11/29/18  9:50 PM  Result Value Ref Range   Creatinine, Ser 1.10 0.61 - 1.24 mg/dL  Urinalysis, Routine w reflex microscopic     Status: Abnormal   Collection Time: 11/29/18 11:54 PM  Result Value Ref Range   Color, Urine YELLOW YELLOW   APPearance CLEAR CLEAR   Specific Gravity, Urine >1.046 (H) 1.005 - 1.030   pH 5.0 5.0 - 8.0   Glucose, UA NEGATIVE NEGATIVE mg/dL   Hgb urine dipstick NEGATIVE NEGATIVE   Bilirubin Urine NEGATIVE NEGATIVE   Ketones, ur 5 (A) NEGATIVE mg/dL   Protein, ur NEGATIVE NEGATIVE mg/dL   Nitrite NEGATIVE NEGATIVE   Leukocytes,Ua NEGATIVE NEGATIVE    Comment: Performed at [facility] [address], [place], [place] [address]   Ct Abdomen Pelvis W Contrast  Result Date: 11/29/2018 CLINICAL DATA:  Lower abdominal pain since 3:30 p.m. today. EXAM: CT ABDOMEN AND PELVIS WITH CONTRAST TECHNIQUE: Multidetector CT imaging of the abdomen and pelvis was performed using the standard protocol following bolus administration of intravenous contrast. CONTRAST:  143mL OMNIPAQUE IOHEXOL 300 MG/ML  SOLN COMPARISON:  08/03/2018. FINDINGS: Lower chest: Stable bibasilar bullous changes and small area of consolidation laterally on the right. No pleural fluid. Mildly enlarged heart. Intracardiac pacer wires. Hepatobiliary: Cholecystectomy clips. Stable small cyst in the dome of the liver on the right. Pancreas: Unremarkable. No pancreatic ductal dilatation or surrounding inflammatory changes. Spleen: Normal in size without focal abnormality. Adrenals/Urinary Tract: Normal appearing adrenal glands. 2 mm lower pole left renal calculus. Tiny lower pole right renal calculus. Small bilateral renal cysts. No  bladder or ureteral calculi. The anterior, inferior portion of the urinary bladder is extending into a small proximal right inguinal hernia. Stomach/Bowel: A distal gastric staple line is again demonstrated with marked luminal narrowing. There is retained fluid and gas in the proximal and distal portions of the stomach, before and after the staple line. Multiple mildly dilated loops of proximal small bowel with normal caliber distal small bowel loops. There is swirling of the mesentery and appearance of the bowel loop suggesting an internal hernia in the right lower abdomen and pelvis. Large number of sigmoid, descending and distal transverse colon diverticula without evidence of diverticulitis. No evidence of appendicitis. Vascular/Lymphatic: Atheromatous arterial calcifications without aneurysm. No enlarged lymph nodes. Reproductive: Mildly enlarged prostate gland containing coarse calcifications. Other: Small proximal right inguinal hernia containing a portion of herniated urinary bladder. There is also a moderate-sized supraumbilical hernia to the left midline containing fat. Musculoskeletal: Lumbar and lower thoracic spine degenerative changes. Stable 50% L1 vertebral compression deformity with kyphoplasty material. IMPRESSION: 1. Partial small bowel obstruction. This could be due to a large internal hernia in the right lower abdomen and pelvis. This could also be due to an adhesion. 2. Extensive colonic diverticulosis without evidence of diverticulitis. 3. Small proximal right inguinal hernia containing a portion of herniated urinary bladder. 4. Moderate-sized supraumbilical hernia to the left of midline containing fat. 5. Changes of COPD at the lung bases. Electronically Signed   By: Claudie Revering M.D.   On: 11/29/2018 23:17    Pending Labs Unresulted Labs (From admission, onward)    Start     Ordered   11/29/18 2204  Urine culture  ONCE - STAT,   STAT     11/29/18 2203   Signed and Held  Comprehensive  metabolic panel  Tomorrow morning,   R     Signed and Held   Signed and Held  CBC  Tomorrow morning,   R     Signed and Held   Signed and Held  Urine culture  Once,   R     Signed and Held          Vitals/Pain Today's Vitals   11/29/18 2144 11/29/18 2145 11/29/18 2230 11/29/18 2330  BP: 132/78 138/84 140/69 111/69  Pulse:  60 61 (!) 59  Resp:  (!) 23 19 16   Temp:      TempSrc:      SpO2:  98% 96% 92%  Weight:      Height:      PainSc:        Isolation Precautions No active isolations  Medications Medications  morphine 2 MG/ML injection 2 mg (2 mg Intravenous Not Given 11/29/18 2221)  morphine 2 MG/ML injection 2 mg (0 mg Intravenous Hold 11/29/18 2353)  sodium chloride 0.9 % bolus 1,000 mL (0 mLs Intravenous Stopped 11/29/18 2353)  ondansetron (ZOFRAN) injection 4 mg (4 mg Intravenous Given 11/29/18 2159)  iohexol (OMNIPAQUE) 300 MG/ML solution 100 mL (100 mLs Intravenous Contrast Given 11/29/18 2232)    Mobility walks with device Moderate fall risk   Focused Assessments   R Recommendations: See Admitting Provider Note  Report given to:   Additional Notes:  Patient is very cooperative and pleasant. Wife is at bedside.

## 2018-11-30 NOTE — H&P (Signed)
History and Physical   CALVYN KURTZMAN KNL:[id number] DOB: November 26, 1930 DOA: 11/29/2018  Referring MD/NP/PA: Dr. Tyrone Nine  PCP: [name], MD   Patient coming from: Home  Chief Complaint: Pain around testicles and penis  HPI: [name] is a 85 y.o. male with medical history significant of atrial fibrillation, coronary artery disease, sick sinus syndrome status post pacemaker placement, diastolic dysfunction CHF, Parkinson's disease hypertension who was brought in by wife secondary to sudden onset of pain in his pelvic area radiating to his penis and testicle area.  Pain was persistent.  Not relieved by Tylenol or ibuprofen.  Given IV pain medicine in the ER but still having pain.  Patient denied any dysuria.  Denied any fever or chills.  He is hard of hearing.  Patient was seen and evaluated with a CT abdomen pelvis showing partial small bowel obstruction and possible inguinal hernia but not incarcerated.  He is being admitted mainly for persistent pain.  No nausea or vomiting.  He has some loose stools since he had cholecystectomy years ago.  No hematemesis.  No hematochezia and no melena..  ED Course: Temperature 97.9 blood pressure 156/141 pulse 59 respiratory 23 oxygen sats 92% on room air.  White count is 15.7 hemoglobin 16.2 amplitude 26.  CT abdomen pelvis shows partial small bowel obstruction with extensive colonic diverticulosis no diverticulitis small proximal inguinal hernia on the right containing portion of urinary bladder also moderate sized proximal supraumbilical hernia.  Surgery was consulted but deferred to medicine for admission and management.  Review of Systems: As per HPI otherwise 10 point review of systems negative.    Past Medical History:  Diagnosis Date  . CAD (coronary artery disease) 09/2008  . Cancer ([place])    basal cell carcinoma  . CHF (congestive heart failure) ([place])   . Complication of anesthesia    06/12/11 anesthesia made legs asleep also and went into chf  and ended up in ICU after gallbladder surgery  . Diastolic dysfunction    hx of chf due to fluid overload 9/12   . Dysrhythmia    atrial fibrillation  . GERD (gastroesophageal reflux disease)   . History of blood product transfusion    d/t bleeding ulcer  . Hypertension   . Macular degeneration (senile) of retina   . Persistent atrial fibrillation   . PONV (postoperative nausea and vomiting)   . Presence of permanent cardiac pacemaker 2010  . Shortness of breath dyspnea    exertion  . Sick sinus syndrome ([facility])    St. Jude  . Syncope    resolved s/p PPM    Past Surgical History:  Procedure Laterality Date  . CHOLECYSTECTOMY  06/12/11  . INGUINAL HERNIA REPAIR Left 10/06/2014   Procedure: OPEN REPAIR LEFT INGUINAL HERNIA ;  Surgeon: Fanny Skates, MD;  Location: [facility];  Service: General;  Laterality: Left;  . INSERTION OF MESH Left 10/06/2014   Procedure: INSERTION OF MESH;  Surgeon: Fanny Skates, MD;  Location: [facility];  Service: General;  Laterality: Left;  . IR KYPHO LUMBAR INC FX REDUCE BONE BX UNI/BIL CANNULATION INC/IMAGING  12/26/2017  . IR RADIOLOGIST EVAL & MGMT  12/12/2017  . LAPAROSCOPIC CHOLECYSTECTOMY W/ CHOLANGIOGRAPHY  06/12/2011  . PACEMAKER INSERTION  09/28/08   SJM [place] XL DR  . perforated ulcer    . PPM GENERATOR CHANGEOUT N/A 10/21/2018   Procedure: PPM GENERATOR CHANGEOUT;  Surgeon: Thompson Grayer, MD;  Location: [facility];  Service: Cardiovascular;  Laterality: N/A;  .  right inguinal hernia repair  11/19/2002   with mesh  . TRANSURETHRAL RESECTION OF BLADDER TUMOR  09/06/2011   Procedure: TRANSURETHRAL RESECTION OF BLADDER TUMOR (TURBT);  Surgeon: Ailene Rud, MD;  Location: [facility];  Service: Urology;  Laterality: N/A;  . TRANSURETHRAL RESECTION OF PROSTATE  15/17/6160  . umblical hernia  7/37/1062     reports that he quit smoking about 60 years ago. He has never used smokeless tobacco. He reports that he does not drink alcohol or use drugs.   Allergies  Allergen Reactions  . Hydrocodone     "Made him feel bad"  . Hydroxyzine Anxiety    Family History  Problem Relation Age of Onset  . Heart attack Mother 58       MI  . Heart failure Father   . Heart attack Brother        Late 68s  . Cancer Sister      Prior to Admission medications   Medication Sig Start Date End Date Taking? Authorizing Provider  carbidopa-levodopa (SINEMET) 25-100 MG tablet Take 1 tablet by mouth 3 (three) times daily. Patient taking differently: Take 1 tablet by mouth 3 (three) times daily. (0900, 1400, 2000) 06/14/17  Yes Nanavati, Ankit, MD  Cholecalciferol (VITAMIN D3) 50 MCG (2000 UT) TABS Take 2,000 Units by mouth daily.   Yes [provider]  ELIQUIS 5 MG TABS tablet TAKE 1 TABLET TWICE A DAY Patient taking differently: Take 5 mg by mouth 2 (two) times daily.  10/13/18  Yes Allred, Jeneen Rinks, MD  famotidine (PEPCID) 20 MG tablet Take 20 mg by mouth daily.   Yes [provider]  Magnesium 250 MG TABS Take 250-500 mg by mouth See admin instructions. Take 1 tablet (250 mg) by mouth on day 1, then take 2 tablets (500 mg) by mouth on day 2 (alternate every other day)   Yes [provider]  triamcinolone cream (KENALOG) 0.5 % Apply 1 application topically 2 (two) times daily as needed (for PHN).  08/19/18  Yes [provider]    Physical Exam: Vitals:   11/29/18 2144 11/29/18 2145 11/29/18 2230 11/29/18 2330  BP: 132/78 138/84 140/69 111/69  Pulse:  60 61 (!) 59  Resp:  (!) 23 19 16   Temp:      TempSrc:      SpO2:  98% 96% 92%  Weight:      Height:          Constitutional: NAD, calm, comfortable Vitals:   11/29/18 2144 11/29/18 2145 11/29/18 2230 11/29/18 2330  BP: 132/78 138/84 140/69 111/69  Pulse:  60 61 (!) 59  Resp:  (!) 23 19 16   Temp:      TempSrc:      SpO2:  98% 96% 92%  Weight:      Height:       Eyes: PERRL, lids and conjunctivae normal ENMT: Mucous membranes are moist. Posterior  pharynx clear of any exudate or lesions.Normal dentition.  Neck: normal, supple, no masses, no thyromegaly Respiratory: clear to auscultation bilaterally, no wheezing, no crackles. Normal respiratory effort. No accessory muscle use.  Cardiovascular: Regular rate and rhythm, no murmurs / rubs / gallops. No extremity edema. 2+ pedal pulses. No carotid bruits.  Abdomen: no tenderness, no masses palpated. No hepatosplenomegaly. Bowel sounds positive.  Tenderness in the suprapubic area, around the testicles and penis. Musculoskeletal: no clubbing / cyanosis. No joint deformity upper and lower extremities. Good ROM, no contractures. Normal muscle tone.  Skin:  no rashes, lesions, ulcers. No induration Neurologic: CN 2-12 grossly intact. Sensation intact, DTR normal. Strength 5/5 in all 4.  Psychiatric: Normal judgment and insight. Alert and oriented x 3. Normal mood.     Labs on Admission: I have personally reviewed following labs and imaging studies  CBC: Recent Labs  Lab 11/29/18 2139  WBC 15.7*  NEUTROABS 12.7*  HGB 16.2  HCT 52.1*  MCV 93.4  PLT 818   Basic Metabolic Panel: Recent Labs  Lab 11/29/18 2139 11/29/18 2150  NA 136  --   K 4.1  --   CL 104  --   CO2 25  --   GLUCOSE 104*  --   BUN 17  --   CREATININE 1.13 1.10  CALCIUM 8.9  --    GFR: Estimated Creatinine Clearance: 52.3 mL/min (by C-G formula based on SCr of 1.1 mg/dL). Liver Function Tests: Recent Labs  Lab 11/29/18 2139  AST 20  ALT <5  ALKPHOS 95  BILITOT 1.1  PROT 7.1  ALBUMIN 3.7   Recent Labs  Lab 11/29/18 2139  LIPASE 26   No results for input(s): AMMONIA in the last 168 hours. Coagulation Profile: No results for input(s): INR, PROTIME in the last 168 hours. Cardiac Enzymes: No results for input(s): CKTOTAL, CKMB, CKMBINDEX, TROPONINI in the last 168 hours. BNP (last 3 results) No results for input(s): PROBNP in the last 8760 hours. HbA1C: No results for input(s): HGBA1C in the last 72  hours. CBG: No results for input(s): GLUCAP in the last 168 hours. Lipid Profile: No results for input(s): CHOL, HDL, LDLCALC, TRIG, CHOLHDL, LDLDIRECT in the last 72 hours. Thyroid Function Tests: No results for input(s): TSH, T4TOTAL, FREET4, T3FREE, THYROIDAB in the last 72 hours. Anemia Panel: No results for input(s): VITAMINB12, FOLATE, FERRITIN, TIBC, IRON, RETICCTPCT in the last 72 hours. Urine analysis:    Component Value Date/Time   COLORURINE YELLOW 11/29/2018 2354   APPEARANCEUR CLEAR 11/29/2018 2354   LABSPEC >1.046 (H) 11/29/2018 2354   PHURINE 5.0 11/29/2018 2354   GLUCOSEU NEGATIVE 11/29/2018 2354   HGBUR NEGATIVE 11/29/2018 2354   BILIRUBINUR NEGATIVE 11/29/2018 2354   KETONESUR 5 (A) 11/29/2018 2354   PROTEINUR NEGATIVE 11/29/2018 2354   UROBILINOGEN 1.0 07/30/2014 0920   NITRITE NEGATIVE 11/29/2018 2354   LEUKOCYTESUR NEGATIVE 11/29/2018 2354   Sepsis Labs: @LABRCNTIP (procalcitonin:4,lacticidven:4) )No results found for this or any previous visit (from the past 240 hour(s)).   Radiological Exams on Admission: Ct Abdomen Pelvis W Contrast  Result Date: 11/29/2018 CLINICAL DATA:  Lower abdominal pain since 3:30 p.m. today. EXAM: CT ABDOMEN AND PELVIS WITH CONTRAST TECHNIQUE: Multidetector CT imaging of the abdomen and pelvis was performed using the standard protocol following bolus administration of intravenous contrast. CONTRAST:  147mL OMNIPAQUE IOHEXOL 300 MG/ML  SOLN COMPARISON:  08/03/2018. FINDINGS: Lower chest: Stable bibasilar bullous changes and small area of consolidation laterally on the right. No pleural fluid. Mildly enlarged heart. Intracardiac pacer wires. Hepatobiliary: Cholecystectomy clips. Stable small cyst in the dome of the liver on the right. Pancreas: Unremarkable. No pancreatic ductal dilatation or surrounding inflammatory changes. Spleen: Normal in size without focal abnormality. Adrenals/Urinary Tract: Normal appearing adrenal glands. 2 mm  lower pole left renal calculus. Tiny lower pole right renal calculus. Small bilateral renal cysts. No bladder or ureteral calculi. The anterior, inferior portion of the urinary bladder is extending into a small proximal right inguinal hernia. Stomach/Bowel: A distal gastric staple line is again demonstrated with marked luminal narrowing. There  is retained fluid and gas in the proximal and distal portions of the stomach, before and after the staple line. Multiple mildly dilated loops of proximal small bowel with normal caliber distal small bowel loops. There is swirling of the mesentery and appearance of the bowel loop suggesting an internal hernia in the right lower abdomen and pelvis. Large number of sigmoid, descending and distal transverse colon diverticula without evidence of diverticulitis. No evidence of appendicitis. Vascular/Lymphatic: Atheromatous arterial calcifications without aneurysm. No enlarged lymph nodes. Reproductive: Mildly enlarged prostate gland containing coarse calcifications. Other: Small proximal right inguinal hernia containing a portion of herniated urinary bladder. There is also a moderate-sized supraumbilical hernia to the left midline containing fat. Musculoskeletal: Lumbar and lower thoracic spine degenerative changes. Stable 50% L1 vertebral compression deformity with kyphoplasty material. IMPRESSION: 1. Partial small bowel obstruction. This could be due to a large internal hernia in the right lower abdomen and pelvis. This could also be due to an adhesion. 2. Extensive colonic diverticulosis without evidence of diverticulitis. 3. Small proximal right inguinal hernia containing a portion of herniated urinary bladder. 4. Moderate-sized supraumbilical hernia to the left of midline containing fat. 5. Changes of COPD at the lung bases. Electronically Signed   By: Claudie Revering M.D.   On: 11/29/2018 23:17      Assessment/Plan Principal Problem:   Pelvic pain Active Problems:    Hypertension   Permanent atrial fibrillation   Pacemaker-St.Jude   Parkinson's disease ([place])     #1 pelvic pain: Cause of pain not clear.  Could be urethritis.  It could also be early UTI.  The hernia involving urinary bladder may be contributing as well as suspected partial small bowel obstruction.  Patient will be admitted for observation.  Urinalysis initiated.  We will consider empiric antibiotics especially with a white count elevated.  If pain is improved and symptoms are much better patient will be discharged in the next 24 hours.  #2 Parkinson's disease: Continue home regimen.  #3 hypertension: Continue blood pressure medications.  #4 atrial fibrillation: Rate is controlled.  Continue Eliquis. patient has paced rhythm.  #5 leukocytosis: Worrisome for infection and inflammation.  Empirically start Rocephin while waiting for urinalysis.   DVT prophylaxis: Eliquis Code Status: Full code Family Communication: Wife at bedside Disposition Plan: To be determined Consults called: General surgery but declined consult Admission status: Observation  Severity of Illness: The appropriate patient status for this patient is OBSERVATION. Observation status is judged to be reasonable and necessary in order to provide the required intensity of service to ensure the patient's safety. The patient's presenting symptoms, physical exam findings, and initial radiographic and laboratory data in the context of their medical condition is felt to place them at decreased risk for further clinical deterioration. Furthermore, it is anticipated that the patient will be medically stable for discharge from the hospital within 2 midnights of admission. The following factors support the patient status of observation.   " The patient's presenting symptoms include pelvic pain. " The physical exam findings include tenderness around scrotum. " The initial radiographic and laboratory data are negative.      Barbette Merino MD [facility] Pager [phone]  If 7PM-7AM, please contact night-coverage [email or web address] Password [facility]  11/30/2018, 12:17 AM

## 2018-11-30 NOTE — Progress Notes (Signed)
Patient admitted after midnight, please see H&P.  Came in with pain between his testicles.  CT scan showed a hernia and ? p SBO.  Pain improved except when he coughs.  Will slowly advance diet.  Bowel regimen.  Urine appears concentrated so will continue IVF for now.  Does not appear infected.  Trend WBC count with quick de-escalation of abx as indicated.  Eulogio Bear DO

## 2018-11-30 NOTE — Progress Notes (Signed)
Patient admitted into 5C08 accompanied by his significant other. A/O x4. Vital signs stable. Made comfortable in bed and oriented to the room. Call bell placed within reach.

## 2018-11-30 NOTE — ED Notes (Signed)
Attempted to call report X 1. 

## 2018-12-01 DIAGNOSIS — R102 Pelvic and perineal pain: Secondary | ICD-10-CM | POA: Diagnosis not present

## 2018-12-01 LAB — CBC
HCT: 40.6 % (ref 39.0–52.0)
Hemoglobin: 13.2 g/dL (ref 13.0–17.0)
MCH: 30 pg (ref 26.0–34.0)
MCHC: 32.5 g/dL (ref 30.0–36.0)
MCV: 92.3 fL (ref 80.0–100.0)
Platelets: 169 10*3/uL (ref 150–400)
RBC: 4.4 MIL/uL (ref 4.22–5.81)
RDW: 13.9 % (ref 11.5–15.5)
WBC: 7.7 10*3/uL (ref 4.0–10.5)
nRBC: 0 % (ref 0.0–0.2)

## 2018-12-01 LAB — URINE CULTURE: Culture: NO GROWTH

## 2018-12-01 LAB — BASIC METABOLIC PANEL
Anion gap: 5 (ref 5–15)
BUN: 9 mg/dL (ref 8–23)
CO2: 24 mmol/L (ref 22–32)
CREATININE: 0.94 mg/dL (ref 0.61–1.24)
Calcium: 8.1 mg/dL — ABNORMAL LOW (ref 8.9–10.3)
Chloride: 109 mmol/L (ref 98–111)
GFR calc Af Amer: >60 mL/min (ref >60)
GFR calc non Af Amer: >60 mL/min (ref >60)
Glucose, Bld: 103 mg/dL — ABNORMAL HIGH (ref 70–99)
Potassium: 3.8 mmol/L (ref 3.5–5.1)
SODIUM: 138 mmol/L (ref 135–145)

## 2018-12-01 NOTE — Care Management Obs Status (Signed)
[facility] NOTIFICATION   Patient Details  Name: [name] MRN: [id number] Date of Birth: [birth year]   Medicare Observation Status Notification Given:  Yes    Midge Minium RN, BSN, NCM-BC, ACM-RN [phone] 12/01/2018, 11:19 AM

## 2018-12-01 NOTE — Progress Notes (Signed)
Responded to bed alarm to find patient sitting at the foot of the bed. Assessed orientation and found pt. to only be oriented to self. When I asked wife about patients current condition she stated he "gets like this sometimes" and didn't appear to have any concern. Pt placed back in bed with alarm on, bed in lowest position and floor mats in place.

## 2018-12-01 NOTE — Discharge Summary (Signed)
Physician Discharge Summary  [name]:[id number] DOB: May 22, 1931 DOA: 11/29/2018  PCP: [name], MD  Admit date: 11/29/2018 Discharge date: 12/01/2018  Admitted From: home Discharge disposition: home   Recommendations for Outpatient Follow-Up:   1. Outpatient follow up for hernia and referral to surgery 2. Bowel regimen 3. hydration   Discharge Diagnosis:   Principal Problem:   Pelvic pain Active Problems:   Hypertension   Permanent atrial fibrillation   Pacemaker-St.Jude   Parkinson's disease ([place])    Discharge Condition: Improved.  Diet recommendation: Low sodium, heart healthy  Wound care: None.  Code status: Full.   History of Present Illness:   [name] is a 85 y.o. male with medical history significant of atrial fibrillation, coronary artery disease, sick sinus syndrome status post pacemaker placement, diastolic dysfunction CHF, Parkinson's disease hypertension who was brought in by wife secondary to sudden onset of pain in his pelvic area radiating to his penis and testicle area.  Pain was persistent.  Not relieved by Tylenol or ibuprofen.  Given IV pain medicine in the ER but still having pain.  Patient denied any dysuria.  Denied any fever or chills.  He is hard of hearing.  Patient was seen and evaluated with a CT abdomen pelvis showing partial small bowel obstruction and possible inguinal hernia but not incarcerated.  He is being admitted mainly for persistent pain.  No nausea or vomiting.  He has some loose stools since he had cholecystectomy years ago.  No hematemesis.  No hematochezia and no melena.[name]   Hospital Course by Problem:   #1 pelvic pain:  -improved, suspect the hernia involving urinary bladder may be contributing as well as suspected partial small bowel obstruction. -diet advanced and pain resolved after bowel rest -will nee close outpatient follow up  #2 Parkinson's disease: Continue home regimen.  #3  hypertension: Continue blood pressure medications.  #4 atrial fibrillation: Rate is controlled.  Continue Eliquis. patient has paced rhythm.  #5 leukocytosis: stress reaction?, resolved    Medical Consultants:      Discharge Exam:   Vitals:   12/01/18 0000 12/01/18 0605  BP: (!) 112/58 128/80  Pulse: 60 60  Resp:    Temp: 97.9 F (36.6 C) 97.9 F (36.6 C)  SpO2: 95% 94%   Vitals:   11/30/18 1132 11/30/18 1721 12/01/18 0000 12/01/18 0605  BP: 114/73 129/82 (!) 112/58 128/80  Pulse: (!) 59 94 60 60  Resp: 19 18    Temp: (!) 96.4 F (35.8 C) (!) 97.3 F (36.3 C) 97.9 F (36.6 C) 97.9 F (36.6 C)  TempSrc: Oral Oral Oral Oral  SpO2: 95% 98% 95% 94%  Weight:      Height:        General exam: Appears calm and comfortable.   The results of significant diagnostics from this hospitalization (including imaging, microbiology, ancillary and laboratory) are listed below for reference.     Procedures and Diagnostic Studies:   Ct Abdomen Pelvis W Contrast  Result Date: 11/29/2018 CLINICAL DATA:  Lower abdominal pain since 3:30 p.m. today. EXAM: CT ABDOMEN AND PELVIS WITH CONTRAST TECHNIQUE: Multidetector CT imaging of the abdomen and pelvis was performed using the standard protocol following bolus administration of intravenous contrast. CONTRAST:  147mL OMNIPAQUE IOHEXOL 300 MG/ML  SOLN COMPARISON:  08/03/2018. FINDINGS: Lower chest: Stable bibasilar bullous changes and small area of consolidation laterally on the right. No pleural fluid. Mildly enlarged heart. Intracardiac pacer wires. Hepatobiliary: Cholecystectomy clips.  Stable small cyst in the dome of the liver on the right. Pancreas: Unremarkable. No pancreatic ductal dilatation or surrounding inflammatory changes. Spleen: Normal in size without focal abnormality. Adrenals/Urinary Tract: Normal appearing adrenal glands. 2 mm lower pole left renal calculus. Tiny lower pole right renal calculus. Small bilateral renal cysts.  No bladder or ureteral calculi. The anterior, inferior portion of the urinary bladder is extending into a small proximal right inguinal hernia. Stomach/Bowel: A distal gastric staple line is again demonstrated with marked luminal narrowing. There is retained fluid and gas in the proximal and distal portions of the stomach, before and after the staple line. Multiple mildly dilated loops of proximal small bowel with normal caliber distal small bowel loops. There is swirling of the mesentery and appearance of the bowel loop suggesting an internal hernia in the right lower abdomen and pelvis. Large number of sigmoid, descending and distal transverse colon diverticula without evidence of diverticulitis. No evidence of appendicitis. Vascular/Lymphatic: Atheromatous arterial calcifications without aneurysm. No enlarged lymph nodes. Reproductive: Mildly enlarged prostate gland containing coarse calcifications. Other: Small proximal right inguinal hernia containing a portion of herniated urinary bladder. There is also a moderate-sized supraumbilical hernia to the left midline containing fat. Musculoskeletal: Lumbar and lower thoracic spine degenerative changes. Stable 50% L1 vertebral compression deformity with kyphoplasty material. IMPRESSION: 1. Partial small bowel obstruction. This could be due to a large internal hernia in the right lower abdomen and pelvis. This could also be due to an adhesion. 2. Extensive colonic diverticulosis without evidence of diverticulitis. 3. Small proximal right inguinal hernia containing a portion of herniated urinary bladder. 4. Moderate-sized supraumbilical hernia to the left of midline containing fat. 5. Changes of COPD at the lung bases. Electronically Signed   By: Claudie Revering M.D.   On: 11/29/2018 23:17     Labs:   Basic Metabolic Panel: Recent Labs  Lab 11/29/18 2139 11/29/18 2150 11/30/18 0706 12/01/18 0544  NA 136  --  136 138  K 4.1  --  3.9 3.8  CL 104  --  106 109   CO2 25  --  23 24  GLUCOSE 104*  --  123* 103*  BUN 17  --  15 9  CREATININE 1.13 1.10 1.02 0.94  CALCIUM 8.9  --  7.8* 8.1*   GFR Estimated Creatinine Clearance: 61.2 mL/min (by C-G formula based on SCr of 0.94 mg/dL). Liver Function Tests: Recent Labs  Lab 11/29/18 2139 11/30/18 0706  AST 20 23  ALT <5 9  ALKPHOS 95 79  BILITOT 1.1 0.9  PROT 7.1 6.2*  ALBUMIN 3.7 2.9*   Recent Labs  Lab 11/29/18 2139  LIPASE 26   No results for input(s): AMMONIA in the last 168 hours. Coagulation profile No results for input(s): INR, PROTIME in the last 168 hours.  CBC: Recent Labs  Lab 11/29/18 2139 11/30/18 0706 12/01/18 0544  WBC 15.7* 13.9* 7.7  NEUTROABS 12.7*  --   --   HGB 16.2 13.9 13.2  HCT 52.1* 43.0 40.6  MCV 93.4 91.9 92.3  PLT 226 195 169   Cardiac Enzymes: No results for input(s): CKTOTAL, CKMB, CKMBINDEX, TROPONINI in the last 168 hours. BNP: Invalid input(s): POCBNP CBG: No results for input(s): GLUCAP in the last 168 hours. D-Dimer No results for input(s): DDIMER in the last 72 hours. Hgb A1c No results for input(s): HGBA1C in the last 72 hours. Lipid Profile No results for input(s): CHOL, HDL, LDLCALC, TRIG, CHOLHDL, LDLDIRECT in the last 72  hours. Thyroid function studies No results for input(s): TSH, T4TOTAL, T3FREE, THYROIDAB in the last 72 hours.  Invalid input(s): FREET3 Anemia work up No results for input(s): VITAMINB12, FOLATE, FERRITIN, TIBC, IRON, RETICCTPCT in the last 72 hours. Microbiology Recent Results (from the past 240 hour(s))  Urine culture     Status: Abnormal   Collection Time: 11/29/18 11:54 PM  Result Value Ref Range Status   Specimen Description URINE, CLEAN CATCH  Final   Special Requests   Final    NONE Performed at [facility], [address] [place], [place] [address]    Culture MULTIPLE SPECIES PRESENT, SUGGEST RECOLLECTION (A)  Final   Report Status 12/01/2018 FINAL  Final  MRSA PCR Screening     Status:  Abnormal   Collection Time: 11/30/18  5:26 AM  Result Value Ref Range Status   MRSA by PCR POSITIVE (A) NEGATIVE Final    Comment:        The GeneXpert MRSA Assay (FDA approved for NASAL specimens only), is one component of a comprehensive MRSA colonization surveillance program. It is not intended to diagnose MRSA infection nor to guide or monitor treatment for MRSA infections. CRITICAL RESULT CALLED TO, READ BACK BY AND VERIFIED WITH: RN HAVY MCINTOSH  [phone] FCP Performed at [facility], [place] [address] [place], [place] [address]      Discharge Instructions:   Discharge Instructions    Diet - low sodium heart healthy   Complete by:  As directed    Discharge instructions   Complete by:  As directed    Stay hydrated Bowel regimen for toothpaste consistency diet   Increase activity slowly   Complete by:  As directed      Allergies as of 12/01/2018      Reactions   Hydrocodone    "Made him feel bad"   Hydroxyzine Anxiety      Medication List    TAKE these medications   carbidopa-levodopa 25-100 MG tablet Commonly known as:  Sinemet Take 1 tablet by mouth 3 (three) times daily. What changed:  additional instructions   Eliquis 5 MG Tabs tablet Generic drug:  apixaban TAKE 1 TABLET TWICE A DAY What changed:  how much to take   famotidine 20 MG tablet Commonly known as:  PEPCID Take 20 mg by mouth daily.   Magnesium 250 MG Tabs Take 250-500 mg by mouth See admin instructions. Take 1 tablet (250 mg) by mouth on day 1, then take 2 tablets (500 mg) by mouth on day 2 (alternate every other day)   triamcinolone cream 0.5 % Commonly known as:  KENALOG Apply 1 application topically 2 (two) times daily as needed (for PHN).   Vitamin D3 50 MCG (2000 UT) Tabs Take 2,000 Units by mouth daily.      Follow-up Information    [name], MD Follow up in 1 week(s).   Specialty:  Internal Medicine Contact information: [address] [place] [address] [phone]            Time coordinating discharge: 25 min  Signed:  Geradine Girt DO  [facility] 12/01/2018, 10:19 AM

## 2018-12-01 NOTE — Discharge Instructions (Signed)

## 2019-01-12 ENCOUNTER — Other Ambulatory Visit: Payer: Self-pay | Admitting: Internal Medicine

## 2019-01-12 ENCOUNTER — Telehealth: Payer: Self-pay

## 2019-01-12 NOTE — Telephone Encounter (Signed)
I did a Eliquis PA over the phone with Estill Bamberg at [employer]. Per Estill Bamberg this PA has been approved. Approval good from 12/13/2018 until 01/12/2020.  Approval#: [id number]  I have notified [facility] of this approval.

## 2019-01-13 NOTE — Telephone Encounter (Signed)
**Note De-Identified  Obfuscation** Letter received from [employer] stating that they have approved the pts Eliquis. Approval good from 12/13/2018 until 01/12/2020.

## 2019-01-28 ENCOUNTER — Encounter: Payer: Medicare Other | Admitting: Internal Medicine

## 2019-01-29 ENCOUNTER — Telehealth: Payer: Self-pay

## 2019-01-29 NOTE — Telephone Encounter (Signed)
Spoke with pts wife regarding appt on 02/02/19. Pts wife stated they do not have good reception where they live and would rather do a pone visit. Pts wife was advise to have pt check his vitals prior to appt. All questions were address.

## 2019-02-02 ENCOUNTER — Ambulatory Visit (INDEPENDENT_AMBULATORY_CARE_PROVIDER_SITE_OTHER): Payer: Medicare Other | Admitting: *Deleted

## 2019-02-02 ENCOUNTER — Telehealth (INDEPENDENT_AMBULATORY_CARE_PROVIDER_SITE_OTHER): Payer: Medicare Other | Admitting: Internal Medicine

## 2019-02-02 ENCOUNTER — Other Ambulatory Visit: Payer: Self-pay

## 2019-02-02 DIAGNOSIS — R001 Bradycardia, unspecified: Secondary | ICD-10-CM | POA: Diagnosis not present

## 2019-02-02 DIAGNOSIS — I495 Sick sinus syndrome: Secondary | ICD-10-CM | POA: Diagnosis not present

## 2019-02-02 DIAGNOSIS — I10 Essential (primary) hypertension: Secondary | ICD-10-CM | POA: Diagnosis not present

## 2019-02-02 DIAGNOSIS — I4821 Permanent atrial fibrillation: Secondary | ICD-10-CM | POA: Diagnosis not present

## 2019-02-02 LAB — CUP PACEART REMOTE DEVICE CHECK
Date Time Interrogation Session: 20200518150951
Implantable Lead Implant Date: 20100112
Implantable Lead Implant Date: 20100112
Implantable Lead Location: 753859
Implantable Lead Location: 753860
Implantable Pulse Generator Implant Date: 20200204
Pulse Gen Model: 1272
Pulse Gen Serial Number: 9077470

## 2019-02-02 NOTE — Progress Notes (Signed)
Electrophysiology TeleHealth Note   Due to national recommendations of social distancing due to [place] 19, an audio telehealth visit is felt to be most appropriate for this patient at this time.  Verbal consent was obtained from the patient today.  He does not have smart phone technology or internet and is unable to do a virtual visit today.   Date:  02/02/2019   ID:  Durward Fortes, DOB 02/15/1931, MRN [id number]  Location: patient's home  Provider location: [address] [place]  Evaluation Performed: Follow-up visit  PCP:  [name], MD  Electrophysiologist:  Dr Rayann Heman  Chief Complaint:  afib  History of Present Illness:    GREGERY WALBERG is a 85 y.o. male who presents via audio conferencing for a telehealth visit today.  Since his pacemaker generator change, the patient reports doing very well.  Today, he denies symptoms of palpitations, chest pain, shortness of breath,  lower extremity edema, dizziness, presyncope, or syncope.  The patient is otherwise without complaint today.  The patient denies symptoms of fevers, chills, cough, or new SOB worrisome for COVID 19.  Past Medical History:  Diagnosis Date  . CAD (coronary artery disease) 09/2008  . Cancer ([place])    basal cell carcinoma  . CHF (congestive heart failure) ([place])   . Complication of anesthesia    06/12/11 anesthesia made legs asleep also and went into chf and ended up in ICU after gallbladder surgery  . Diastolic dysfunction    hx of chf due to fluid overload 9/12   . Dysrhythmia    atrial fibrillation  . GERD (gastroesophageal reflux disease)   . History of blood product transfusion    d/t bleeding ulcer  . Hypertension   . Macular degeneration (senile) of retina   . Persistent atrial fibrillation   . PONV (postoperative nausea and vomiting)   . Presence of permanent cardiac pacemaker 2010  . Shortness of breath dyspnea    exertion  . Sick sinus syndrome ([facility])    St. Jude  . Syncope    resolved s/p PPM    Past Surgical History:  Procedure Laterality Date  . CHOLECYSTECTOMY  06/12/11  . INGUINAL HERNIA REPAIR Left 10/06/2014   Procedure: OPEN REPAIR LEFT INGUINAL HERNIA ;  Surgeon: Fanny Skates, MD;  Location: [facility];  Service: General;  Laterality: Left;  . INSERTION OF MESH Left 10/06/2014   Procedure: INSERTION OF MESH;  Surgeon: Fanny Skates, MD;  Location: [facility];  Service: General;  Laterality: Left;  . IR KYPHO LUMBAR INC FX REDUCE BONE BX UNI/BIL CANNULATION INC/IMAGING  12/26/2017  . IR RADIOLOGIST EVAL & MGMT  12/12/2017  . LAPAROSCOPIC CHOLECYSTECTOMY W/ CHOLANGIOGRAPHY  06/12/2011  . PACEMAKER INSERTION  09/28/08   SJM [place] XL DR  . perforated ulcer    . PPM GENERATOR CHANGEOUT N/A 10/21/2018   Procedure: PPM GENERATOR CHANGEOUT;  Surgeon: Thompson Grayer, MD;  Location: [facility];  Service: Cardiovascular;  Laterality: N/A;  . right inguinal hernia repair  11/19/2002   with mesh  . TRANSURETHRAL RESECTION OF BLADDER TUMOR  09/06/2011   Procedure: TRANSURETHRAL RESECTION OF BLADDER TUMOR (TURBT);  Surgeon: Ailene Rud, MD;  Location: [facility];  Service: Urology;  Laterality: N/A;  . TRANSURETHRAL RESECTION OF PROSTATE  85/27/7824  . umblical hernia  2/35/3614    Current Outpatient Medications  Medication Sig Dispense Refill  . apixaban (ELIQUIS) 5 MG TABS tablet Take 1 tablet (5 mg total)  by mouth 2 (two) times daily. 180 tablet 0  . carbidopa-levodopa (SINEMET) 25-100 MG tablet Take 1 tablet by mouth 3 (three) times daily. (Patient taking differently: Take 1 tablet by mouth 3 (three) times daily. (0900, 1400, 2000)) 30 tablet 0  . Cholecalciferol (VITAMIN D3) 50 MCG (2000 UT) TABS Take 2,000 Units by mouth daily.    . famotidine (PEPCID) 20 MG tablet Take 20 mg by mouth daily.    . Magnesium 250 MG TABS Take 250-500 mg by mouth See admin instructions. Take 1 tablet (250 mg) by mouth on day 1, then take 2 tablets (500 mg) by mouth on day 2  (alternate every other day)    . triamcinolone cream (KENALOG) 0.5 % Apply 1 application topically 2 (two) times daily as needed (for PHN).      No current facility-administered medications for this visit.     Allergies:   Hydrocodone and Hydroxyzine   Social History:  The patient  reports that he quit smoking about 60 years ago. He has never used smokeless tobacco. He reports that he does not drink alcohol or use drugs.   Family History:  The patient's  family history includes Cancer in his sister; Heart attack in his brother; Heart attack (age of onset: 69) in his mother; Heart failure in his father.   ROS:  Please see the history of present illness.   All other systems are personally reviewed and negative.    Exam:    Vital Signs:  There were no vitals taken for this visit.  Well sounding   Labs/Other Tests and Data Reviewed:    Recent Labs: 11/30/2018: ALT 9 12/01/2018: BUN 9; Creatinine, Ser 0.94; Hemoglobin 13.2; Platelets 169; Potassium 3.8; Sodium 138   Wt Readings from Last 3 Encounters:  11/29/18 173 lb (78.5 kg)  10/21/18 177 lb (80.3 kg)  10/17/18 170 lb 9.6 oz (77.4 kg)     Other studies personally reviewed: Additional studies/ records that were reviewed today include: my prior office notes Review of the above records today demonstrates: as above   Device check from in office 11/05/2018 was reviewed.  I will have him send a manual transmission today.   ASSESSMENT & PLAN:    1.  Symptomatic bradycardia Normal device function I will have him send a remote transmissions today. Follow remotely going forward  2. Permanent afib  Rate controlled chads2vasc score is 4 On eliquis  3 HTN Stable No change required today  4. COVID 19 screen The patient denies symptoms of COVID 19 at this time.  The importance of social distancing was discussed today.  Follow-up:  EP NP in a year Next remote: now  Current medicines are reviewed at length with the patient  today.   The patient does not have concerns regarding his medicines.  The following changes were made today:  none  Labs/ tests ordered today include:  No orders of the defined types were placed in this encounter.   Patient Risk:  after full review of this patients clinical status, I feel that they are at moderate risk at this time.  Today, I have spent 22 minutes with the patient with telehealth technology discussing afib .    Army Fossa, MD  02/02/2019 12:13 PM     [place] [address] [phone] (office) [phone] (fax)

## 2019-02-11 ENCOUNTER — Encounter: Payer: Self-pay | Admitting: Cardiology

## 2019-02-11 NOTE — Progress Notes (Signed)
Remote pacemaker transmission.   

## 2019-05-04 ENCOUNTER — Ambulatory Visit (INDEPENDENT_AMBULATORY_CARE_PROVIDER_SITE_OTHER): Payer: Medicare Other | Admitting: *Deleted

## 2019-05-04 DIAGNOSIS — I495 Sick sinus syndrome: Secondary | ICD-10-CM

## 2019-05-04 LAB — CUP PACEART REMOTE DEVICE CHECK
Battery Remaining Longevity: 117 mo
Battery Remaining Percentage: 95.5 %
Battery Voltage: 3.02 V
Brady Statistic RV Percent Paced: 99 %
Date Time Interrogation Session: 20200817060017
Implantable Lead Implant Date: 20100112
Implantable Lead Implant Date: 20100112
Implantable Lead Location: 753859
Implantable Lead Location: 753860
Implantable Pulse Generator Implant Date: 20200204
Lead Channel Impedance Value: 360 Ohm
Lead Channel Pacing Threshold Amplitude: 1 V
Lead Channel Pacing Threshold Pulse Width: 0.4 ms
Lead Channel Sensing Intrinsic Amplitude: 9.2 mV
Lead Channel Setting Pacing Amplitude: 2.5 V
Lead Channel Setting Pacing Pulse Width: 0.4 ms
Lead Channel Setting Sensing Sensitivity: 2 mV
Pulse Gen Model: 1272
Pulse Gen Serial Number: 9077470

## 2019-05-12 ENCOUNTER — Encounter: Payer: Self-pay | Admitting: Cardiology

## 2019-05-12 NOTE — Progress Notes (Signed)
Remote pacemaker transmission.   

## 2019-06-10 ENCOUNTER — Inpatient Hospital Stay (HOSPITAL_COMMUNITY)
Admission: EM | Admit: 2019-06-10 | Discharge: 2019-06-13 | DRG: 948 | Disposition: A | Discharge status: Home Health | Payer: Medicare Other | Attending: Internal Medicine | Admitting: Internal Medicine

## 2019-06-10 DIAGNOSIS — I110 Hypertensive heart disease with heart failure: Secondary | ICD-10-CM | POA: Diagnosis present

## 2019-06-10 DIAGNOSIS — I495 Sick sinus syndrome: Secondary | ICD-10-CM | POA: Diagnosis present

## 2019-06-10 DIAGNOSIS — Z8249 Family history of ischemic heart disease and other diseases of the circulatory system: Secondary | ICD-10-CM

## 2019-06-10 DIAGNOSIS — Z87891 Personal history of nicotine dependence: Secondary | ICD-10-CM

## 2019-06-10 DIAGNOSIS — Z20828 Contact with and (suspected) exposure to other viral communicable diseases: Secondary | ICD-10-CM | POA: Diagnosis present

## 2019-06-10 DIAGNOSIS — R29898 Other symptoms and signs involving the musculoskeletal system: Secondary | ICD-10-CM | POA: Diagnosis present

## 2019-06-10 DIAGNOSIS — I4821 Permanent atrial fibrillation: Secondary | ICD-10-CM | POA: Diagnosis present

## 2019-06-10 DIAGNOSIS — K219 Gastro-esophageal reflux disease without esophagitis: Secondary | ICD-10-CM | POA: Diagnosis present

## 2019-06-10 DIAGNOSIS — G20A1 Parkinson's disease without dyskinesia, without mention of fluctuations: Secondary | ICD-10-CM | POA: Diagnosis present

## 2019-06-10 DIAGNOSIS — G20 Parkinson's disease: Secondary | ICD-10-CM | POA: Diagnosis present

## 2019-06-10 DIAGNOSIS — M549 Dorsalgia, unspecified: Secondary | ICD-10-CM | POA: Diagnosis present

## 2019-06-10 DIAGNOSIS — D72829 Elevated white blood cell count, unspecified: Secondary | ICD-10-CM | POA: Diagnosis present

## 2019-06-10 DIAGNOSIS — Z85828 Personal history of other malignant neoplasm of skin: Secondary | ICD-10-CM

## 2019-06-10 DIAGNOSIS — H3530 Unspecified macular degeneration: Secondary | ICD-10-CM | POA: Diagnosis present

## 2019-06-10 DIAGNOSIS — R531 Weakness: Secondary | ICD-10-CM

## 2019-06-10 DIAGNOSIS — M5410 Radiculopathy, site unspecified: Secondary | ICD-10-CM

## 2019-06-10 DIAGNOSIS — Z950 Presence of cardiac pacemaker: Secondary | ICD-10-CM | POA: Diagnosis present

## 2019-06-10 DIAGNOSIS — R739 Hyperglycemia, unspecified: Secondary | ICD-10-CM | POA: Diagnosis present

## 2019-06-10 DIAGNOSIS — I2510 Atherosclerotic heart disease of native coronary artery without angina pectoris: Secondary | ICD-10-CM | POA: Diagnosis present

## 2019-06-10 DIAGNOSIS — R7989 Other specified abnormal findings of blood chemistry: Secondary | ICD-10-CM | POA: Diagnosis present

## 2019-06-10 DIAGNOSIS — G8929 Other chronic pain: Secondary | ICD-10-CM | POA: Diagnosis present

## 2019-06-10 DIAGNOSIS — H5462 Unqualified visual loss, left eye, normal vision right eye: Secondary | ICD-10-CM | POA: Diagnosis present

## 2019-06-10 DIAGNOSIS — R55 Syncope and collapse: Secondary | ICD-10-CM | POA: Diagnosis present

## 2019-06-10 DIAGNOSIS — I5032 Chronic diastolic (congestive) heart failure: Secondary | ICD-10-CM | POA: Diagnosis present

## 2019-06-10 DIAGNOSIS — R262 Difficulty in walking, not elsewhere classified: Secondary | ICD-10-CM

## 2019-06-10 DIAGNOSIS — R0602 Shortness of breath: Secondary | ICD-10-CM

## 2019-06-10 DIAGNOSIS — I10 Essential (primary) hypertension: Secondary | ICD-10-CM | POA: Diagnosis present

## 2019-06-10 DIAGNOSIS — R200 Anesthesia of skin: Secondary | ICD-10-CM | POA: Diagnosis present

## 2019-06-10 DIAGNOSIS — Z7901 Long term (current) use of anticoagulants: Secondary | ICD-10-CM

## 2019-06-10 DIAGNOSIS — Z9049 Acquired absence of other specified parts of digestive tract: Secondary | ICD-10-CM

## 2019-06-10 NOTE — ED Provider Notes (Signed)
[facility] DEPT Provider Note   CSN: [id number] Arrival date & time: 06/10/19  2259   Time seen 11:25 PM  History   Chief Complaint Chief Complaint  Patient presents with  . Numbness    BLE    HPI [name] is a 85 y.o. male.     HPI patient presents via EMS.  He states he "I cannot lift my legs I come supposed to".  He states it started yesterday.  He states some similar happened about 2 weeks ago and he felt like he was weak in his lower legs however since yesterday he feels like the weakness is more in his upper legs.  He denies any numbness in his legs.  He states both legs are affected, not one more than the other.  He states he has chronic back pain that is controlled with a back rub and states it is not worse than usual.  He denies headache or neck pain.  He denies any fever.  He states it is hard to urinate but he denies any dysuria.  He denies nausea, vomiting, diarrhea, or abdominal pain.  He states he feels like he is constipated.  He denies chest pain but does state he has some dyspnea on exertion.  He denies any coughing.  Per EMS his wife had told him he normally is not able to ambulate to the bathroom but he is usually able to stand up on his walker and then she gives him a urinal.  However since yesterday he has been unable to stand up with his walker.  He states he does not have a neurologist for his Parkinson's.  PCP [name], MD   Past Medical History:  Diagnosis Date  . CAD (coronary artery disease) 09/2008  . Cancer ([place])    basal cell carcinoma  . CHF (congestive heart failure) ([place])   . Complication of anesthesia    06/12/11 anesthesia made legs asleep also and went into chf and ended up in ICU after gallbladder surgery  . Diastolic dysfunction    hx of chf due to fluid overload 9/12   . Dysrhythmia    atrial fibrillation  . GERD (gastroesophageal reflux disease)   . History of blood product transfusion    d/t  bleeding ulcer  . Hypertension   . Macular degeneration (senile) of retina   . Persistent atrial fibrillation   . PONV (postoperative nausea and vomiting)   . Presence of permanent cardiac pacemaker 2010  . Shortness of breath dyspnea    exertion  . Sick sinus syndrome (H[facility])    St. Jude  . Syncope    resolved s/p PPM    Patient Active Problem List   Diagnosis Date Noted  . Lower extremity weakness 06/11/2019  . Parkinson's disease ([place]) 11/30/2018  . Pelvic pain 11/30/2018  . Intracranial aneurysm 11/28/2016  . Gait disturbance 11/06/2016  . Muscle fatigue 11/06/2016  . Post herpetic neuralgia 11/06/2016  . Left inguinal hernia 10/06/2014  . Shortness of breath 01/03/2014  . Pacemaker-St.Jude 07/09/2012  . Permanent atrial fibrillation 10/20/2010  . HYPERLIPIDEMIA-MIXED 10/26/2009  . Macular degeneration 12/28/2008  . CAD, NATIVE VESSEL 12/28/2008  . GERD 12/28/2008  . SYNCOPE 12/28/2008  . Hypertension 12/28/2008  . Sick sinus syndrome ([place]) 12/28/2008    Past Surgical History:  Procedure Laterality Date  . CHOLECYSTECTOMY  06/12/11  . INGUINAL HERNIA REPAIR Left 10/06/2014   Procedure: OPEN REPAIR LEFT INGUINAL HERNIA ;  Surgeon: Fanny Skates,  MD;  Location: [facility];  Service: General;  Laterality: Left;  . INSERTION OF MESH Left 10/06/2014   Procedure: INSERTION OF MESH;  Surgeon: Fanny Skates, MD;  Location: [facility];  Service: General;  Laterality: Left;  . IR KYPHO LUMBAR INC FX REDUCE BONE BX UNI/BIL CANNULATION INC/IMAGING  12/26/2017  . IR RADIOLOGIST EVAL & MGMT  12/12/2017  . LAPAROSCOPIC CHOLECYSTECTOMY W/ CHOLANGIOGRAPHY  06/12/2011  . PACEMAKER INSERTION  09/28/08   SJM [place] XL DR  . perforated ulcer    . PPM GENERATOR CHANGEOUT N/A 10/21/2018   Procedure: PPM GENERATOR CHANGEOUT;  Surgeon: Thompson Grayer, MD;  Location: [facility];  Service: Cardiovascular;  Laterality: N/A;  . right inguinal hernia repair  11/19/2002   with mesh  . TRANSURETHRAL  RESECTION OF BLADDER TUMOR  09/06/2011   Procedure: TRANSURETHRAL RESECTION OF BLADDER TUMOR (TURBT);  Surgeon: Ailene Rud, MD;  Location: [facility];  Service: Urology;  Laterality: N/A;  . TRANSURETHRAL RESECTION OF PROSTATE  [id number]  . umblical hernia  [id number]        Home Medications    Prior to Admission medications   Medication Sig Start Date End Date Taking? Authorizing Provider  apixaban (ELIQUIS) 5 MG TABS tablet Take 1 tablet (5 mg total) by mouth 2 (two) times daily. 01/13/19  Yes Allred, Jeneen Rinks, MD  carbidopa-levodopa (SINEMET) 25-100 MG tablet Take 1 tablet by mouth 3 (three) times daily. Patient taking differently: Take 1 tablet by mouth 3 (three) times daily. (0900, 1400, 2000) 06/14/17  Yes Nanavati, Ankit, MD  Cholecalciferol (VITAMIN D3) 50 MCG (2000 UT) TABS Take 2,000 Units by mouth daily.   Yes [provider]  famotidine (PEPCID) 20 MG tablet Take 20 mg by mouth daily.   Yes [provider]  Magnesium 250 MG TABS Take 250 mg by mouth every other day.    Yes [provider]  memantine (NAMENDA) 5 MG tablet Take 5 mg by mouth 2 (two) times daily. 05/25/19  Yes [provider]  triamcinolone cream (KENALOG) 0.1 % Apply 1 application topically 3 (three) times daily.  05/25/19  Yes [provider]  vitamin B-12 (CYANOCOBALAMIN) 1000 MCG tablet Take 1,000 mcg by mouth daily.   Yes [provider]    Family History Family History  Problem Relation Age of Onset  . Heart attack Mother 71       MI  . Heart failure Father   . Heart attack Brother        Late 2s  . Cancer Sister     Social History Social History   Tobacco Use  . Smoking status: Former Smoker    Quit date: 09/17/1958    Years since quitting: 60.7  . Smokeless tobacco: Never Used  . Tobacco comment: quit 1968  Substance Use Topics  . Alcohol use: No  . Drug use: No  Lives at home Lives with spouse   Allergies   Hydrocodone and  Hydroxyzine   Review of Systems Review of Systems  All other systems reviewed and are negative.    Physical Exam Updated Vital Signs BP 126/81   Pulse 63   Temp 98.3 F (36.8 C) (Oral)   Resp (!) 21   SpO2 94%   Physical Exam Vitals signs and nursing note reviewed.  Constitutional:      General: He is not in acute distress.    Appearance: Normal appearance. He is well-developed. He is not ill-appearing or toxic-appearing.  Comments: Frail pleasant elderly male  HENT:     Head: Normocephalic and atraumatic.     Right Ear: External ear normal.     Left Ear: External ear normal.     Nose: Nose normal. No mucosal edema or rhinorrhea.     Mouth/Throat:     Mouth: Mucous membranes are moist.     Dentition: No dental abscesses.     Pharynx: No oropharyngeal exudate, posterior oropharyngeal erythema or uvula swelling.  Eyes:     Extraocular Movements: Extraocular movements intact.     Conjunctiva/sclera: Conjunctivae normal.     Pupils: Pupils are equal, round, and reactive to light.  Neck:     Musculoskeletal: Full passive range of motion without pain, normal range of motion and neck supple.  Cardiovascular:     Rate and Rhythm: Normal rate and regular rhythm.     Pulses: Normal pulses.     Heart sounds: Normal heart sounds. No murmur. No friction rub. No gallop.   Pulmonary:     Effort: Pulmonary effort is normal. No respiratory distress.     Breath sounds: Normal breath sounds. No wheezing, rhonchi or rales.  Chest:     Chest wall: No tenderness or crepitus.  Abdominal:     General: Bowel sounds are normal. There is no distension.     Palpations: Abdomen is soft.     Tenderness: There is no abdominal tenderness. There is no guarding or rebound.  Musculoskeletal: Normal range of motion.        General: No tenderness.     Comments: Moves all extremities well.   Skin:    General: Skin is warm and dry.     Coloration: Skin is not pale.     Findings: No erythema or  rash.  Neurological:     General: No focal deficit present.     Mental Status: He is alert and oriented to person, place, and time.     Cranial Nerves: No cranial nerve deficit.     Comments: Patient states the sensation of his arms and legs are the same to light touch.  He is able to flex his knees and he is also able to pick his legs up off the stretcher.  He however states he feels weak in his lower extremities but not his upper extremities.  Psychiatric:        Mood and Affect: Mood normal. Mood is not anxious.        Speech: Speech normal.        Behavior: Behavior normal.        Thought Content: Thought content normal.      ED Treatments / Results  Labs (all labs ordered are listed, but only abnormal results are displayed) Results for orders placed or performed during the hospital encounter of 06/10/19  Comprehensive metabolic panel  Result Value Ref Range   Sodium 137 135 - 145 mmol/L   Potassium 3.8 3.5 - 5.1 mmol/L   Chloride 106 98 - 111 mmol/L   CO2 22 22 - 32 mmol/L   Glucose, Bld 127 (H) 70 - 99 mg/dL   BUN 16 8 - 23 mg/dL   Creatinine, Ser 0.93 0.61 - 1.24 mg/dL   Calcium 8.4 (L) 8.9 - 10.3 mg/dL   Total Protein 7.0 6.5 - 8.1 g/dL   Albumin 3.4 (L) 3.5 - 5.0 g/dL   AST 18 15 - 41 U/L   ALT 7 0 - 44 U/L   Alkaline Phosphatase 89  38 - 126 U/L   Total Bilirubin 1.3 (H) 0.3 - 1.2 mg/dL   GFR calc non Af Amer >60 >60 mL/min   GFR calc Af Amer >60 >60 mL/min   Anion gap 9 5 - 15  CBC with Differential  Result Value Ref Range   WBC 12.4 (H) 4.0 - 10.5 K/uL   RBC 4.81 4.22 - 5.81 MIL/uL   Hemoglobin 14.6 13.0 - 17.0 g/dL   HCT 44.6 39.0 - 52.0 %   MCV 92.7 80.0 - 100.0 fL   MCH 30.4 26.0 - 34.0 pg   MCHC 32.7 30.0 - 36.0 g/dL   RDW 14.4 11.5 - 15.5 %   Platelets 186 150 - 400 K/uL   nRBC 0.0 0.0 - 0.2 %   Neutrophils Relative % 72 %   Neutro Abs 8.9 (H) 1.7 - 7.7 K/uL   Lymphocytes Relative 13 %   Lymphs Abs 1.6 0.7 - 4.0 K/uL   Monocytes Relative 14 %    Monocytes Absolute 1.7 (H) 0.1 - 1.0 K/uL   Eosinophils Relative 0 %   Eosinophils Absolute 0.0 0.0 - 0.5 K/uL   Basophils Relative 0 %   Basophils Absolute 0.0 0.0 - 0.1 K/uL   Immature Granulocytes 1 %   Abs Immature Granulocytes 0.10 (H) 0.00 - 0.07 K/uL  Urinalysis, Routine w reflex microscopic  Result Value Ref Range   Color, Urine YELLOW YELLOW   APPearance CLEAR CLEAR   Specific Gravity, Urine 1.026 1.005 - 1.030   pH 6.0 5.0 - 8.0   Glucose, UA NEGATIVE NEGATIVE mg/dL   Hgb urine dipstick SMALL (A) NEGATIVE   Bilirubin Urine NEGATIVE NEGATIVE   Ketones, ur 5 (A) NEGATIVE mg/dL   Protein, ur NEGATIVE NEGATIVE mg/dL   Nitrite NEGATIVE NEGATIVE   Leukocytes,Ua NEGATIVE NEGATIVE   RBC / HPF 0-5 0 - 5 RBC/hpf   WBC, UA 0-5 0 - 5 WBC/hpf   Bacteria, UA RARE (A) NONE SEEN   Mucus PRESENT   Magnesium  Result Value Ref Range   Magnesium 2.2 1.7 - 2.4 mg/dL  Brain natriuretic peptide  Result Value Ref Range   B Natriuretic Peptide 179.8 (H) 0.0 - 100.0 pg/mL  Troponin I (High Sensitivity)  Result Value Ref Range   Troponin I (High Sensitivity) 9 <18 ng/L  Troponin I (High Sensitivity)  Result Value Ref Range   Troponin I (High Sensitivity) 9 <18 ng/L   Laboratory interpretation all normal except leukocytosis, no obvious urinary tract infection, urine culture sent    EKG EKG Interpretation  Date/Time:  Wednesday June 10 2019 23:50:39 EDT Ventricular Rate:  60 PR Interval:    QRS Duration: 160 QT Interval:  443 QTC Calculation: 443 R Axis:   -84 Text Interpretation:  Atrial fibrillation paced rhythm Short PR interval RBBB and LAFB Since last tracing rate faster 03 Aug 2018 Confirmed by Rolland Porter [phone]) on 06/10/2019 11:54:45 PM   Radiology Ct Head Wo Contrast  Result Date: 06/11/2019 CLINICAL DATA:  Unable to walk for 2 days. Bilateral lower extremity numbness EXAM: CT HEAD WITHOUT CONTRAST TECHNIQUE: Contiguous axial images were obtained from the base of  the skull through the vertex without intravenous contrast. COMPARISON:  08/25/2017 FINDINGS: Brain: No evidence of acute infarction, hemorrhage, hydrocephalus, extra-axial collection or mass lesion/mass effect. Confluent low-density in the deep cerebral white matter attributed to chronic small vessel ischemia. 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 Central predominant atrophy. No significant change compared to 2018 Vascular: Atherosclerotic  calcification Skull: Negative for fracture or bone lesion Sinuses/Orbits: Moderate mucosal thickening and bilateral ethmoid sinuses, incidental to the history. IMPRESSION: 1. No acute finding. 2. Atrophy and chronic small vessel ischemia that is stable from 2018. Electronically Signed   By: Monte Fantasia M.D.   On: 06/11/2019 05:08   Dg Chest Port 1 View  Result Date: 06/11/2019 CLINICAL DATA:  85 year old male with shortness of breath. EXAM: PORTABLE CHEST 1 VIEW COMPARISON:  Chest radiograph dated 08/25/2017 FINDINGS: The patient is slightly rotated. There is diffuse interstitial coarsening and bibasilar atelectatic changes/scarring. An area of opacity in the right hilar region likely projection of mediastinum due to patient's rotation. Repeat radiograph with repositioning of the patient may provide better evaluation. There is no pleural effusion pneumothorax. Stable cardiomegaly. Left pectoral pacemaker device. No acute osseous pathology. IMPRESSION: No acute cardiopulmonary process. Electronically Signed   By: Anner Crete M.D.   On: 06/11/2019 00:27    Procedures Procedures (including critical care time)  Medications Ordered in ED Medications - No data to display   Initial Impression / Assessment and Plan / ED Course  I have reviewed the triage vital signs and the nursing notes.  Pertinent labs & imaging results that were available during my care of the patient were reviewed by me and considered in my medical decision making (see chart  for details).    Laboratory testing was ordered.  Wife is here and states he normally is able to ambulate.  He did physical therapy at home for 2 months recently and did well.  She states physical therapy came out this week and did his initial evaluation and are supposed to start physical therapy again at 9:00 this morning.  She states that he stopped being able to ambulate 2 days ago but it seemed worse yesterday.  She states he is supposed to see his neurologist, Dr. Felecia Shelling on the 28th.  We will do CT of the head and then talk to the hospitalist about admission.  Urine culture was done for possible occult urinary tract infection.  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  He complains of being off balance and his wife states he has been told before that a lot of his balance problems are related to his loss of vision.  We discussed after doing head CT I will talk to the hospitalist about admission.  Patient may need evaluation by neurology to see if he needs his medications adjusted for his Parkinson's.  He does not have an obvious UTI.  6:19 AM Dr. Hal Hope, hospitalist will admit.      Final Clinical Impressions(s) / ED Diagnoses   Final diagnoses:  Unable to walk  Parkinson's disease ([place])  Weakness    Plan admission  Rolland Porter, MD, Barbette Or, MD 06/11/19 [phone]

## 2019-06-10 NOTE — ED Triage Notes (Signed)
Pt with history of Parkinson and has SOB at baseline per pt. Pt with BLE numbness and inability to walk. Unclear if this is a complication of his parkinson's or new symptom.

## 2019-06-11 ENCOUNTER — Emergency Department (HOSPITAL_COMMUNITY): Payer: Medicare Other

## 2019-06-11 ENCOUNTER — Other Ambulatory Visit: Payer: Self-pay

## 2019-06-11 ENCOUNTER — Encounter (HOSPITAL_COMMUNITY): Payer: Self-pay

## 2019-06-11 ENCOUNTER — Observation Stay (HOSPITAL_COMMUNITY): Payer: Medicare Other

## 2019-06-11 DIAGNOSIS — R739 Hyperglycemia, unspecified: Secondary | ICD-10-CM | POA: Diagnosis present

## 2019-06-11 DIAGNOSIS — D72829 Elevated white blood cell count, unspecified: Secondary | ICD-10-CM | POA: Diagnosis present

## 2019-06-11 DIAGNOSIS — R7989 Other specified abnormal findings of blood chemistry: Secondary | ICD-10-CM | POA: Diagnosis present

## 2019-06-11 DIAGNOSIS — R29898 Other symptoms and signs involving the musculoskeletal system: Secondary | ICD-10-CM | POA: Diagnosis not present

## 2019-06-11 LAB — CBC WITH DIFFERENTIAL/PLATELET
Abs Immature Granulocytes: 0.1 10*3/uL — ABNORMAL HIGH (ref 0.00–0.07)
Basophils Absolute: 0 10*3/uL (ref 0.0–0.1)
Basophils Relative: 0 %
Eosinophils Absolute: 0 10*3/uL (ref 0.0–0.5)
Eosinophils Relative: 0 %
HCT: 44.6 % (ref 39.0–52.0)
Hemoglobin: 14.6 g/dL (ref 13.0–17.0)
Immature Granulocytes: 1 %
Lymphocytes Relative: 13 %
Lymphs Abs: 1.6 10*3/uL (ref 0.7–4.0)
MCH: 30.4 pg (ref 26.0–34.0)
MCHC: 32.7 g/dL (ref 30.0–36.0)
MCV: 92.7 fL (ref 80.0–100.0)
Monocytes Absolute: 1.7 10*3/uL — ABNORMAL HIGH (ref 0.1–1.0)
Monocytes Relative: 14 %
Neutro Abs: 8.9 10*3/uL — ABNORMAL HIGH (ref 1.7–7.7)
Neutrophils Relative %: 72 %
Platelets: 186 10*3/uL (ref 150–400)
RBC: 4.81 MIL/uL (ref 4.22–5.81)
RDW: 14.4 % (ref 11.5–15.5)
WBC: 12.4 10*3/uL — ABNORMAL HIGH (ref 4.0–10.5)
nRBC: 0 % (ref 0.0–0.2)

## 2019-06-11 LAB — SARS CORONAVIRUS 2 (TAT 6-24 HRS): SARS Coronavirus 2: NEGATIVE

## 2019-06-11 LAB — COMPREHENSIVE METABOLIC PANEL
ALT: 7 U/L (ref 0–44)
AST: 18 U/L (ref 15–41)
Albumin: 3.4 g/dL — ABNORMAL LOW (ref 3.5–5.0)
Alkaline Phosphatase: 89 U/L (ref 38–126)
Anion gap: 9 (ref 5–15)
BUN: 16 mg/dL (ref 8–23)
CO2: 22 mmol/L (ref 22–32)
Calcium: 8.4 mg/dL — ABNORMAL LOW (ref 8.9–10.3)
Chloride: 106 mmol/L (ref 98–111)
Creatinine, Ser: 0.93 mg/dL (ref 0.61–1.24)
GFR calc Af Amer: >60 mL/min (ref >60)
GFR calc non Af Amer: >60 mL/min (ref >60)
Glucose, Bld: 127 mg/dL — ABNORMAL HIGH (ref 70–99)
Potassium: 3.8 mmol/L (ref 3.5–5.1)
Sodium: 137 mmol/L (ref 135–145)
Total Bilirubin: 1.3 mg/dL — ABNORMAL HIGH (ref 0.3–1.2)
Total Protein: 7 g/dL (ref 6.5–8.1)

## 2019-06-11 LAB — TROPONIN I (HIGH SENSITIVITY)
Troponin I (High Sensitivity): 9 ng/L (ref <18)
Troponin I (High Sensitivity): 9 ng/L (ref <18)

## 2019-06-11 LAB — URINALYSIS, ROUTINE W REFLEX MICROSCOPIC
Bilirubin Urine: NEGATIVE
Glucose, UA: NEGATIVE mg/dL
Ketones, ur: 5 mg/dL — AB
Leukocytes,Ua: NEGATIVE
Nitrite: NEGATIVE
Protein, ur: NEGATIVE mg/dL
Specific Gravity, Urine: 1.026 (ref 1.005–1.030)
pH: 6 (ref 5.0–8.0)

## 2019-06-11 LAB — CK: Total CK: 95 U/L (ref 49–397)

## 2019-06-11 LAB — BRAIN NATRIURETIC PEPTIDE: B Natriuretic Peptide: 179.8 pg/mL — ABNORMAL HIGH (ref 0.0–100.0)

## 2019-06-11 LAB — MAGNESIUM: Magnesium: 2.2 mg/dL (ref 1.7–2.4)

## 2019-06-11 LAB — TSH: TSH: 1.759 u[IU]/mL (ref 0.350–4.500)

## 2019-06-11 MED ORDER — ONDANSETRON HCL 4 MG/2ML IJ SOLN: 4.0000 mg | Freq: Four times a day (QID) | INTRAMUSCULAR | Status: DC | PRN

## 2019-06-11 MED ORDER — CARBIDOPA-LEVODOPA 25-100 MG PO TABS
1.0000 | ORAL_TABLET | ORAL | Status: DC
  Administered 2019-06-11 – 2019-06-13 (×6): 1 via ORAL
  Filled 2019-06-11 (×7): qty 1

## 2019-06-11 MED ORDER — ACETAMINOPHEN 325 MG PO TABS: 650.0000 mg | ORAL_TABLET | Freq: Four times a day (QID) | ORAL | Status: DC | PRN

## 2019-06-11 MED ORDER — APIXABAN 5 MG PO TABS
5.0000 mg | ORAL_TABLET | Freq: Two times a day (BID) | ORAL | Status: DC
  Administered 2019-06-11 – 2019-06-13 (×5): 5 mg via ORAL
  Filled 2019-06-11 (×6): qty 1

## 2019-06-11 MED ORDER — ONDANSETRON HCL 4 MG PO TABS: 4.0000 mg | ORAL_TABLET | Freq: Four times a day (QID) | ORAL | Status: DC | PRN

## 2019-06-11 MED ORDER — SENNOSIDES-DOCUSATE SODIUM 8.6-50 MG PO TABS: 1.0000 | ORAL_TABLET | Freq: Every evening | ORAL | Status: DC | PRN

## 2019-06-11 MED ORDER — ACETAMINOPHEN 650 MG RE SUPP: 650.0000 mg | Freq: Four times a day (QID) | RECTAL | Status: DC | PRN

## 2019-06-11 MED ORDER — VITAMIN B-12 1000 MCG PO TABS
1000.0000 ug | ORAL_TABLET | Freq: Every day | ORAL | Status: DC
  Administered 2019-06-12 – 2019-06-13 (×2): 1000 ug via ORAL
  Filled 2019-06-11 (×2): qty 1

## 2019-06-11 MED ORDER — SODIUM CHLORIDE 0.9% FLUSH
3.0000 mL | Freq: Two times a day (BID) | INTRAVENOUS | Status: DC
  Administered 2019-06-11 – 2019-06-13 (×4): 3 mL via INTRAVENOUS

## 2019-06-11 MED ORDER — MEMANTINE HCL 10 MG PO TABS
5.0000 mg | ORAL_TABLET | Freq: Two times a day (BID) | ORAL | Status: DC
  Administered 2019-06-11 – 2019-06-13 (×4): 5 mg via ORAL
  Filled 2019-06-11 (×5): qty 1

## 2019-06-11 NOTE — ED Notes (Signed)
ED TO INPATIENT HANDOFF REPORT  Name/Age/Gender [name] 85 y.o. male  Code Status    Code Status Orders  (From admission, onward)         Start     Ordered   06/11/19 0852  Do not attempt resuscitation (DNR)  Continuous    Question Answer Comment  In the event of cardiac or respiratory ARREST Do not call a "code blue"   In the event of cardiac or respiratory ARREST Do not perform Intubation, CPR, defibrillation or ACLS   In the event of cardiac or respiratory ARREST Use medication by any route, position, wound care, and other measures to relive pain and suffering. May use oxygen, suction and manual treatment of airway obstruction as needed for comfort.      06/11/19 0854        Code Status History    Date Active Date Inactive Code Status Order ID Comments User Context   11/30/2018 0110 12/01/2018 1448 Full Code [id number]  Elwyn Reach, MD Inpatient   10/21/2018 1618 10/21/2018 2004 Full Code [id number]  Thompson Grayer, MD Inpatient   10/06/2014 1436 10/07/2014 1311 Full Code [id number]  Fanny Skates, MD Inpatient   Advance Care Planning Activity      Home/SNF/Other Home  Chief Complaint Leg Numbness  Level of Care/Admitting Diagnosis ED Disposition    ED Disposition Condition [place]: [place] [id number]  Level of Care: Telemetry [5]  Admit to tele based on following criteria: Monitor for Ischemic changes  Covid Evaluation: Asymptomatic Screening Protocol (No Symptoms)  Diagnosis: Lower extremity weakness [id number]  Admitting Physician: Rise Patience [phone]  Attending Physician: Rise Patience [phone]  PT Class (Do Not Modify): Observation [104]  PT Acc Code (Do Not Modify): Observation [10022]       Medical History Past Medical History:  Diagnosis Date  . CAD (coronary artery disease) 09/2008  . Cancer ([place])    basal cell carcinoma  . CHF (congestive heart failure) ([place])   . Complication of  anesthesia    06/12/11 anesthesia made legs asleep also and went into chf and ended up in ICU after gallbladder surgery  . Diastolic dysfunction    hx of chf due to fluid overload 9/12   . Dysrhythmia    atrial fibrillation  . GERD (gastroesophageal reflux disease)   . History of blood product transfusion    d/t bleeding ulcer  . Hypertension   . Macular degeneration (senile) of retina   . Persistent atrial fibrillation   . PONV (postoperative nausea and vomiting)   . Presence of permanent cardiac pacemaker 2010  . Shortness of breath dyspnea    exertion  . Sick sinus syndrome (HCC)    St. Jude  . Syncope    resolved s/p PPM    Allergies Allergies  Allergen Reactions  . Hydrocodone     "Made him feel bad"  . Hydroxyzine Anxiety    IV Location/Drains/Wounds Patient Lines/Drains/Airways Status   Active Line/Drains/Airways    Name:   Placement date:   Placement time:   Site:   Days:   Peripheral IV 06/11/19 Right;Posterior Forearm   06/11/19    0229    Forearm   less than 1   Incision (Closed) 12/26/17 Back Medial   12/26/17    1350     532          Labs/Imaging Results for orders placed or performed during the hospital encounter  of 06/10/19 (from the past 48 hour(s))  Comprehensive metabolic panel     Status: Abnormal   Collection Time: 06/10/19 11:37 PM  Result Value Ref Range   Sodium 137 135 - 145 mmol/L   Potassium 3.8 3.5 - 5.1 mmol/L   Chloride 106 98 - 111 mmol/L   CO2 22 22 - 32 mmol/L   Glucose, Bld 127 (H) 70 - 99 mg/dL   BUN 16 8 - 23 mg/dL   Creatinine, Ser 0.93 0.61 - 1.24 mg/dL   Calcium 8.4 (L) 8.9 - 10.3 mg/dL   Total Protein 7.0 6.5 - 8.1 g/dL   Albumin 3.4 (L) 3.5 - 5.0 g/dL   AST 18 15 - 41 U/L   ALT 7 0 - 44 U/L   Alkaline Phosphatase 89 38 - 126 U/L   Total Bilirubin 1.3 (H) 0.3 - 1.2 mg/dL   GFR calc non Af Amer >60 >60 mL/min   GFR calc Af Amer >60 >60 mL/min   Anion gap 9 5 - 15    Comment: Performed at [facility], [place] [address] [place], [place] [address]  CBC with Differential     Status: Abnormal   Collection Time: 06/10/19 11:37 PM  Result Value Ref Range   WBC 12.4 (H) 4.0 - 10.5 K/uL   RBC 4.81 4.22 - 5.81 MIL/uL   Hemoglobin 14.6 13.0 - 17.0 g/dL   HCT 44.6 39.0 - 52.0 %   MCV 92.7 80.0 - 100.0 fL   MCH 30.4 26.0 - 34.0 pg   MCHC 32.7 30.0 - 36.0 g/dL   RDW 14.4 11.5 - 15.5 %   Platelets 186 150 - 400 K/uL   nRBC 0.0 0.0 - 0.2 %   Neutrophils Relative % 72 %   Neutro Abs 8.9 (H) 1.7 - 7.7 K/uL   Lymphocytes Relative 13 %   Lymphs Abs 1.6 0.7 - 4.0 K/uL   Monocytes Relative 14 %   Monocytes Absolute 1.7 (H) 0.1 - 1.0 K/uL   Eosinophils Relative 0 %   Eosinophils Absolute 0.0 0.0 - 0.5 K/uL   Basophils Relative 0 %   Basophils Absolute 0.0 0.0 - 0.1 K/uL   Immature Granulocytes 1 %   Abs Immature Granulocytes 0.10 (H) 0.00 - 0.07 K/uL    Comment: Performed at [facility], [place] [address], [place], [place] [address]  Magnesium     Status: None   Collection Time: 06/10/19 11:37 PM  Result Value Ref Range   Magnesium 2.2 1.7 - 2.4 mg/dL    Comment: Performed at [facility], [place] [address], [place], [place] [address]  Troponin I (High Sensitivity)     Status: None   Collection Time: 06/10/19 11:37 PM  Result Value Ref Range   Troponin I (High Sensitivity) 9 <18 ng/L    Comment: (NOTE) Elevated high sensitivity troponin I (hsTnI) values and significant  changes across serial measurements may suggest ACS but many other  chronic and acute conditions are known to elevate hsTnI results.  Refer to the "Links" section for chest pain algorithms and additional  guidance. Performed at [facility], [place] [address], [place],  [address]   Brain natriuretic peptide     Status: Abnormal   Collection Time: 06/10/19 11:58 PM  Result Value Ref Range   B Natriuretic Peptide 179.8 (H) 0.0 - 100.0 pg/mL    Comment:  Performed at [facility], [place] [address] [place],  [place] [address]  Urinalysis, Routine w reflex microscopic     Status: Abnormal   Collection Time: 06/11/19 12:04 AM  Result Value Ref Range   Color, Urine YELLOW YELLOW   APPearance CLEAR CLEAR   Specific Gravity, Urine 1.026 1.005 - 1.030   pH 6.0 5.0 - 8.0   Glucose, UA NEGATIVE NEGATIVE mg/dL   Hgb urine dipstick SMALL (A) NEGATIVE   Bilirubin Urine NEGATIVE NEGATIVE   Ketones, ur 5 (A) NEGATIVE mg/dL   Protein, ur NEGATIVE NEGATIVE mg/dL   Nitrite NEGATIVE NEGATIVE   Leukocytes,Ua NEGATIVE NEGATIVE   RBC / HPF 0-5 0 - 5 RBC/hpf   WBC, UA 0-5 0 - 5 WBC/hpf   Bacteria, UA RARE (A) NONE SEEN   Mucus PRESENT     Comment: Performed at [facility], [place] [address], [place], [place] [address]  Troponin I (High Sensitivity)     Status: None   Collection Time: 06/11/19  1:38 AM  Result Value Ref Range   Troponin I (High Sensitivity) 9 <18 ng/L    Comment: (NOTE) Elevated high sensitivity troponin I (hsTnI) values and significant  changes across serial measurements may suggest ACS but many other  chronic and acute conditions are known to elevate hsTnI results.  Refer to the "Links" section for chest pain algorithms and additional  guidance. Performed at [facility], [place] [address] [place], [place] [address]   CK     Status: None   Collection Time: 06/11/19  6:40 AM  Result Value Ref Range   Total CK 95 49 - 397 U/L    Comment: Performed at [facility], [place] [address], [place], [place] [address]  TSH     Status: None   Collection Time: 06/11/19  6:40 AM  Result Value Ref Range   TSH 1.759 0.350 - 4.500 uIU/mL    Comment: Performed by a 3rd Generation assay with a functional sensitivity of <=0.01 uIU/mL. Performed at [facility], [place] [address], [place], [place] [address]   SARS CORONAVIRUS 2 (TAT 6-24 HRS) Nasopharyngeal  Nasopharyngeal Swab     Status: None   Collection Time: 06/11/19  7:48 AM   Specimen: Nasopharyngeal Swab  Result Value Ref Range   SARS Coronavirus 2 NEGATIVE NEGATIVE    Comment: (NOTE) SARS-CoV-2 target nucleic acids are NOT DETECTED. The SARS-CoV-2 RNA is generally detectable in upper and lower respiratory specimens during the acute phase of infection. Negative results do not preclude SARS-CoV-2 infection, do not rule out co-infections with other pathogens, and should not be used as the sole basis for treatment or other patient management decisions. Negative results must be combined with clinical observations, patient history, and epidemiological information. The expected result is Negative. Fact Sheet for Patients: SugarRoll.be Fact Sheet for Healthcare Providers: https://www.woods-mathews.com/ This test is not yet approved or cleared by the [place] FDA and  has been authorized for detection and/or diagnosis of SARS-CoV-2 by FDA under an Emergency Use Authorization (EUA). This EUA will remain  in effect (meaning this test can be used) for the duration of the COVID-19 declaration under Section 56 4(b)(1) of the Act, 21 U.S.C. section 360bbb-3(b)(1), unless the authorization is terminated or revoked sooner. Performed at [facility], [place] [address], [place], [place] [address]    Dg Chest 2 View  Result Date: 06/11/2019 CLINICAL DATA:  Weakness EXAM: CHEST - 2 VIEW COMPARISON:  June 11, 2019 FINDINGS: There is mild cardiomegaly. There is  hyperinflation of the lung zones. There is mildly increased interstitial markings seen at both lungs. There is a hazy airspace opacity seen in the left upper lung within the region of the costochondral junction, likely due to overlap of shadows. Healed rib fracture deformity seen in the right thorax. A left pacemaker is seen with the lead tips in the right atrium right ventricle. IMPRESSION:  No active cardiopulmonary disease. Electronically Signed   By: Prudencio Pair M.D.   On: 06/11/2019 09:58   Dg Thoracolumabar Spine  Result Date: 06/11/2019 CLINICAL DATA:  Leg pain EXAM: THORACOLUMBAR SPINE 1V COMPARISON:  November 29, 2018 FINDINGS: There is been prior cement fixation of the L1 vertebral body. Disc height loss with facet arthrosis most notable at L5-S1. There is diffuse osteopenia. No acute fracture. Surgical clips are seen overlying mid abdomen. IMPRESSION: No acute fracture or malalignment. Electronically Signed   By: Prudencio Pair M.D.   On: 06/11/2019 09:56   Ct Head Wo Contrast  Result Date: 06/11/2019 CLINICAL DATA:  Unable to walk for 2 days. Bilateral lower extremity numbness EXAM: CT HEAD WITHOUT CONTRAST TECHNIQUE: Contiguous axial images were obtained from the base of the skull through the vertex without intravenous contrast. COMPARISON:  08/25/2017 FINDINGS: Brain: No evidence of acute infarction, hemorrhage, hydrocephalus, extra-axial collection or mass lesion/mass effect. Confluent low-density in the deep cerebral white matter attributed to chronic small vessel ischemia. 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 Central predominant atrophy. No significant change compared to 2018 Vascular: Atherosclerotic calcification Skull: Negative for fracture or bone lesion Sinuses/Orbits: Moderate mucosal thickening and bilateral ethmoid sinuses, incidental to the history. IMPRESSION: 1. No acute finding. 2. Atrophy and chronic small vessel ischemia that is stable from 2018. Electronically Signed   By: Monte Fantasia M.D.   On: 06/11/2019 05:08   Dg Chest Port 1 View  Result Date: 06/11/2019 CLINICAL DATA:  85 year old male with shortness of breath. EXAM: PORTABLE CHEST 1 VIEW COMPARISON:  Chest radiograph dated 08/25/2017 FINDINGS: The patient is slightly rotated. There is diffuse interstitial coarsening and bibasilar atelectatic changes/scarring. An area of opacity in the  right hilar region likely projection of mediastinum due to patient's rotation. Repeat radiograph with repositioning of the patient may provide better evaluation. There is no pleural effusion pneumothorax. Stable cardiomegaly. Left pectoral pacemaker device. No acute osseous pathology. IMPRESSION: No acute cardiopulmonary process. Electronically Signed   By: Anner Crete M.D.   On: 06/11/2019 00:27    Pending Labs Unresulted Labs (From admission, onward)    Start     Ordered   06/12/19 0500  Comprehensive metabolic panel  Tomorrow morning,   R     06/11/19 0854   06/12/19 0500  CBC  Tomorrow morning,   R     06/11/19 0854   06/11/19 0320  Urine culture  ONCE - STAT,   STAT    Question:  Patient immune status  Answer:  Normal   06/11/19 0319          Vitals/Pain Today's Vitals   06/11/19 1430 06/11/19 1500 06/11/19 1530 06/11/19 1601  BP: 106/83 118/69 111/75 111/64  Pulse: 60 62 (!) 59 (!) 58  Resp: 18   18  Temp:      TempSrc:      SpO2: 95% 96% 95% 97%  Weight:      Height:      PainSc:        Isolation Precautions No active isolations  Medications Medications  apixaban (ELIQUIS)  tablet 5 mg (5 mg Oral Given 06/11/19 1105)  carbidopa-levodopa (SINEMET IR) 25-100 MG per tablet immediate release 1 tablet (1 tablet Oral Given 06/11/19 1300)  memantine (NAMENDA) tablet 5 mg (has no administration in time range)  vitamin B-12 (CYANOCOBALAMIN) tablet 1,000 mcg (has no administration in time range)  sodium chloride flush (NS) 0.9 % injection 3 mL (has no administration in time range)  acetaminophen (TYLENOL) tablet 650 mg (has no administration in time range)    Or  acetaminophen (TYLENOL) suppository 650 mg (has no administration in time range)  senna-docusate (Senokot-S) tablet 1 tablet (has no administration in time range)  ondansetron (ZOFRAN) tablet 4 mg (has no administration in time range)    Or  ondansetron (ZOFRAN) injection 4 mg (has no administration in time  range)    Mobility walks with device

## 2019-06-11 NOTE — ED Notes (Signed)
Per EMT, Pt was able to stand well, but only shuffled when he attempted to walk.  Wife reports he normally walks well.  Sts "he normal walks to the restroom and around the house by himself."  EDP notified.

## 2019-06-11 NOTE — H&P (Addendum)
[facility] History and Physical   Patient: [name] [id number]   PCP: [name], MD DOB: 25-Aug-1931   DOA: 06/10/2019   DOS: 06/11/2019   DOS: the patient was seen and examined on 06/11/2019  Patient coming from: The patient is coming from Home  Chief Complaint: weakness  HPI: [name] is a 85 y.o. male with Past medical history of A. fib, CAD, sick sinus syndrome S/P PPM implant, chronic diastolic CHF, Parkinson's disease, HTN. Patient presents with complaints of generalized weakness and fatigue as well as bilateral numbness in lower extremities. Since last 2 weeks patient started noticing numbness in bilateral lower extremity below the knee.  He also had some weakness and was having difficulty walking and was shuffling around. Since this did not improve on its own he went to see his PCP at which time he did not have any symptoms and therefore no further work-up was recommended. Since last few days patient feels that the symptoms have came back but this time now also involves thigh area. Patient is unable to bear any weight on his leg and reports generalized weakness. Denies any trauma or injury no fall.  No loss of control of bowel or bladder.  No similar weakness in upper extremities or numbness.  No dizziness no lightheadedness.  No vision changes.  No headache.  No chest pain no palpitation. Reports some cough some shortness of breath but that is chronic. Patient was supposed to establish care with Dr. Felecia Shelling, neurology and has an appointment on 06/15/2019. Patient had outpatient PT for 2 months prior to this with improvement in symptoms. Chronic macular degeneration reported by the patient, blind in left eye and minimal vision in the right eye. No recent change in medications reported by the patient.  Patient mentions he is compliant with all medications.  ED Course: CT head was performed which did not show any evidence of acute stroke.  Patient was referred for  further work-up and admission.   At his baseline walks with walker Is not independent for most of his ADL;  Does not manages his medication on his own.  Review of Systems: as mentioned in the history of present illness.  All other systems reviewed and are negative.  Past Medical History:  Diagnosis Date  . CAD (coronary artery disease) 09/2008  . Cancer ([place])    basal cell carcinoma  . CHF (congestive heart failure) ([place])   . Complication of anesthesia    06/12/11 anesthesia made legs asleep also and went into chf and ended up in ICU after gallbladder surgery  . Diastolic dysfunction    hx of chf due to fluid overload 9/12   . Dysrhythmia    atrial fibrillation  . GERD (gastroesophageal reflux disease)   . History of blood product transfusion    d/t bleeding ulcer  . Hypertension   . Macular degeneration (senile) of retina   . Persistent atrial fibrillation   . PONV (postoperative nausea and vomiting)   . Presence of permanent cardiac pacemaker 2010  . Shortness of breath dyspnea    exertion  . Sick sinus syndrome (HCC)    St. Jude  . Syncope    resolved s/p PPM   Past Surgical History:  Procedure Laterality Date  . CHOLECYSTECTOMY  06/12/11  . INGUINAL HERNIA REPAIR Left 10/06/2014   Procedure: OPEN REPAIR LEFT INGUINAL HERNIA ;  Surgeon: Fanny Skates, MD;  Location: [facility];  Service: General;  Laterality: Left;  . INSERTION OF  MESH Left 10/06/2014   Procedure: INSERTION OF MESH;  Surgeon: Fanny Skates, MD;  Location: [place];  Service: General;  Laterality: Left;  . IR KYPHO LUMBAR INC FX REDUCE BONE BX UNI/BIL CANNULATION INC/IMAGING  12/26/2017  . IR RADIOLOGIST EVAL & MGMT  12/12/2017  . LAPAROSCOPIC CHOLECYSTECTOMY W/ CHOLANGIOGRAPHY  06/12/2011  . PACEMAKER INSERTION  09/28/08   SJM [place] XL DR  . perforated ulcer    . PPM GENERATOR CHANGEOUT N/A 10/21/2018   Procedure: PPM GENERATOR CHANGEOUT;  Surgeon: Thompson Grayer, MD;  Location: [facility];  Service:  Cardiovascular;  Laterality: N/A;  . right inguinal hernia repair  11/19/2002   with mesh  . TRANSURETHRAL RESECTION OF BLADDER TUMOR  09/06/2011   Procedure: TRANSURETHRAL RESECTION OF BLADDER TUMOR (TURBT);  Surgeon: Ailene Rud, MD;  Location: [facility];  Service: Urology;  Laterality: N/A;  . TRANSURETHRAL RESECTION OF PROSTATE  [id number]  . umblical hernia  [id number]   Social History:  reports that he quit smoking about 60 years ago. He has never used smokeless tobacco. He reports that he does not drink alcohol or use drugs.  Allergies  Allergen Reactions  . Hydrocodone     "Made him feel bad"  . Hydroxyzine Anxiety  Family history reviewed and not pertinent Family History  Problem Relation Age of Onset  . Heart attack Mother 64       MI  . Heart failure Father   . Heart attack Brother        Late 32s  . Cancer Sister      Prior to Admission medications   Medication Sig Start Date End Date Taking? Authorizing Provider  apixaban (ELIQUIS) 5 MG TABS tablet Take 1 tablet (5 mg total) by mouth 2 (two) times daily. 01/13/19  Yes Allred, Jeneen Rinks, MD  carbidopa-levodopa (SINEMET) 25-100 MG tablet Take 1 tablet by mouth 3 (three) times daily. Patient taking differently: Take 1 tablet by mouth 3 (three) times daily. (0900, 1400, 2000) 06/14/17  Yes Nanavati, Ankit, MD  Cholecalciferol (VITAMIN D3) 50 MCG (2000 UT) TABS Take 2,000 Units by mouth daily.   Yes [provider]  famotidine (PEPCID) 20 MG tablet Take 20 mg by mouth daily.   Yes [provider]  Magnesium 250 MG TABS Take 250 mg by mouth every other day.    Yes [provider]  memantine (NAMENDA) 5 MG tablet Take 5 mg by mouth 2 (two) times daily. 05/25/19  Yes [provider]  triamcinolone cream (KENALOG) 0.1 % Apply 1 application topically 3 (three) times daily.  05/25/19  Yes [provider]  vitamin B-12 (CYANOCOBALAMIN) 1000 MCG tablet Take 1,000 mcg by mouth daily.   Yes  [provider]    Physical Exam: Vitals:   06/11/19 0800 06/11/19 0830 06/11/19 0900 06/11/19 1011  BP: 125/81 137/81 126/75   Pulse: 61 60 64   Resp: (!) 23 (!) 23 14   Temp:      TempSrc:      SpO2: 93% 96% 95%   Weight:    72.6 kg  Height:    5\' 11"  (1.803 m)    General: alert and oriented to time, place, and person. Appear in mild distress, affect appropriate Eyes: PERRL, Conjunctiva normal, chronic left eye blindness as well as right eye visual loss from macular degeneration ENT: Oral Mucosa Clear, moist  Neck: no JVD, no Abnormal Mass Or lumps Cardiovascular: S1 and S2 Present, aortic systolic  Murmur, peripheral  pulses symmetrical Respiratory: good respiratory effort, Bilateral Air entry equal and Decreased, no signs of accessory muscle use, Clear to Auscultation, no Crackles, no wheezes Abdomen: Bowel Sound present, Soft and no tenderness, no hernia Skin: no rashes  Extremities: no Pedal edema, no calf tenderness Neurologic: mental status, alert and oriented x3, speech normal, PERLA, Motor strength 5/5 and symmetric, Sensation grossly normal to light touch, Reflex difficult to assess, Finger to nose test normal bilaterally  and Resting tremor noted Occasionally Gait not checked due to patient safety concerns  Data Reviewed: I have personally reviewed and interpreted labs, imaging as discussed below.  CBC: Recent Labs  Lab 06/10/19 2337  WBC 12.4*  NEUTROABS 8.9*  HGB 14.6  HCT 44.6  MCV 92.7  PLT [id number]   Basic Metabolic Panel: Recent Labs  Lab 06/10/19 2337  NA 137  K 3.8  CL 106  CO2 22  GLUCOSE 127*  BUN 16  CREATININE 0.93  CALCIUM 8.4*  MG 2.2   GFR: Estimated Creatinine Clearance: 58.5 mL/min (by C-G formula based on SCr of 0.93 mg/dL). Liver Function Tests: Recent Labs  Lab 06/10/19 2337  AST 18  ALT 7  ALKPHOS 89  BILITOT 1.3*  PROT 7.0  ALBUMIN 3.4*   No results for input(s): LIPASE, AMYLASE in the last 168 hours. No  results for input(s): AMMONIA in the last 168 hours. Coagulation Profile: No results for input(s): INR, PROTIME in the last 168 hours. Cardiac Enzymes: Recent Labs  Lab 06/11/19 0640  CKTOTAL 95   BNP (last 3 results) No results for input(s): PROBNP in the last 8760 hours. HbA1C: No results for input(s): HGBA1C in the last 72 hours. CBG: No results for input(s): GLUCAP in the last 168 hours. Lipid Profile: No results for input(s): CHOL, HDL, LDLCALC, TRIG, CHOLHDL, LDLDIRECT in the last 72 hours. Thyroid Function Tests: Recent Labs    06/11/19 0640  TSH 1.759   Anemia Panel: No results for input(s): VITAMINB12, FOLATE, FERRITIN, TIBC, IRON, RETICCTPCT in the last 72 hours. Urine analysis:    Component Value Date/Time   COLORURINE YELLOW 06/11/2019 0004   APPEARANCEUR CLEAR 06/11/2019 0004   LABSPEC 1.026 06/11/2019 0004   PHURINE 6.0 06/11/2019 0004   GLUCOSEU NEGATIVE 06/11/2019 0004   HGBUR SMALL (A) 06/11/2019 0004   BILIRUBINUR NEGATIVE 06/11/2019 0004   KETONESUR 5 (A) 06/11/2019 0004   PROTEINUR NEGATIVE 06/11/2019 0004   UROBILINOGEN 1.0 07/30/2014 0920   NITRITE NEGATIVE 06/11/2019 0004   LEUKOCYTESUR NEGATIVE 06/11/2019 0004    Radiological Exams on Admission: Dg Chest 2 View  Result Date: 06/11/2019 CLINICAL DATA:  Weakness EXAM: CHEST - 2 VIEW COMPARISON:  June 11, 2019 FINDINGS: There is mild cardiomegaly. There is hyperinflation of the lung zones. There is mildly increased interstitial markings seen at both lungs. There is a hazy airspace opacity seen in the left upper lung within the region of the costochondral junction, likely due to overlap of shadows. Healed rib fracture deformity seen in the right thorax. A left pacemaker is seen with the lead tips in the right atrium right ventricle. IMPRESSION: No active cardiopulmonary disease. Electronically Signed   By: Prudencio Pair M.D.   On: 06/11/2019 09:58   Dg Thoracolumabar Spine  Result Date:  06/11/2019 CLINICAL DATA:  Leg pain EXAM: THORACOLUMBAR SPINE 1V COMPARISON:  November 29, 2018 FINDINGS: There is been prior cement fixation of the L1 vertebral body. Disc height loss with facet arthrosis most notable at L5-S1. There is diffuse osteopenia. No  acute fracture. Surgical clips are seen overlying mid abdomen. IMPRESSION: No acute fracture or malalignment. Electronically Signed   By: Prudencio Pair M.D.   On: 06/11/2019 09:56   Ct Head Wo Contrast  Result Date: 06/11/2019 CLINICAL DATA:  Unable to walk for 2 days. Bilateral lower extremity numbness EXAM: CT HEAD WITHOUT CONTRAST TECHNIQUE: Contiguous axial images were obtained from the base of the skull through the vertex without intravenous contrast. COMPARISON:  08/25/2017 FINDINGS: Brain: No evidence of acute infarction, hemorrhage, hydrocephalus, extra-axial collection or mass lesion/mass effect. Confluent low-density in the deep cerebral white matter attributed to chronic small vessel ischemia. 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 Central predominant atrophy. No significant change compared to 2018 Vascular: Atherosclerotic calcification Skull: Negative for fracture or bone lesion Sinuses/Orbits: Moderate mucosal thickening and bilateral ethmoid sinuses, incidental to the history. IMPRESSION: 1. No acute finding. 2. Atrophy and chronic small vessel ischemia that is stable from 2018. Electronically Signed   By: Monte Fantasia M.D.   On: 06/11/2019 05:08   Dg Chest Port 1 View  Result Date: 06/11/2019 CLINICAL DATA:  85 year old male with shortness of breath. EXAM: PORTABLE CHEST 1 VIEW COMPARISON:  Chest radiograph dated 08/25/2017 FINDINGS: The patient is slightly rotated. There is diffuse interstitial coarsening and bibasilar atelectatic changes/scarring. An area of opacity in the right hilar region likely projection of mediastinum due to patient's rotation. Repeat radiograph with repositioning of the patient may provide better  evaluation. There is no pleural effusion pneumothorax. Stable cardiomegaly. Left pectoral pacemaker device. No acute osseous pathology. IMPRESSION: No acute cardiopulmonary process. Electronically Signed   By: Anner Crete M.D.   On: 06/11/2019 00:27   EKG: Independently reviewed.  Paced rhythm.  I reviewed all nursing notes, pharmacy notes, vitals, pertinent old records.  Assessment/Plan 1. Lower extremity weakness Etiology not clear. On my examination patient has excellent strength in bilateral lower extremities as well as equal sensation in bilateral lower extremities. Reflexes were difficult to elicit due to cooperation. CT head unremarkable in a patient who has been having symptoms for at least 2 weeks. Unable to perform MRI brain secondary to pacemaker implant.  Will perform a repeat CT scan tomorrow. X-ray thoracolumbar spine shows no evidence of acute abnormality or acute fracture and shows diffuse osteopenia. Exam not typical for radiculopathy. CK normal TSH normal UA shows no evidence of pyuria.  Patient asymptomatic. Troponin BNP unremarkable. Electrolytes normal.  LFTs normal.  Serum creatinine normal. Does not appear to be GBS or any other kind of ascending paralysis currently.  Suspect this could be just progression of Parkinson's disease. PT OT consulted. Continue Sinemet and Namenda medications.  2.  Sick sinus syndrome S/P PPM implant Permanent A. fib Currently paced rhythm. Not on any beta-blocker or rate control medication. Continuing Eliquis per home regimen. No evidence of bleeding reported.  3.  History of essential hypertension History of chronic diastolic CHF. Elevated BNP. Currently does not appear volume overloaded. Blood pressure also appears to be well controlled. No indication for any intervention for now. Monitor on telemetry.  4.  Mild hyperglycemia. Mild leukocytosis. No evidence of active infection. Likely stress  reaction. Monitor. UA clear, chest x-ray clear.  Nutrition: Cardiac diet DVT Prophylaxis: Therapeutic Anticoagulation with Eliquis  Advance goals of care discussion: DNR   Consults: None  Family Communication: no family was present at bedside, at the time of interview.   Disposition: Admitted as observation, telemetry unit. Likely to be discharged home versus SNF, in 1-2  days.  I have discussed plan of care as described above with RN and patient/family.  Severity of Illness: The appropriate patient status for this patient is OBSERVATION. Observation status is judged to be reasonable and necessary in order to provide the required intensity of service to ensure the patient's safety. The patient's presenting symptoms, physical exam findings, and initial radiographic and laboratory data in the context of their medical condition is felt to place them at decreased risk for further clinical deterioration. Furthermore, it is anticipated that the patient will be medically stable for discharge from the hospital within 2 midnights of admission. The following factors support the patient status of observation.   " The patient's presenting symptoms include difficulty walking. " The physical exam findings include bilateral vision loss. " The initial radiographic and laboratory data are concerning for mild leukocytosis.     Author: Berle Mull, MD [facility] 06/11/2019 10:22 AM   To reach On-call, see care teams to locate the attending and reach out to them via [email or web address]. If 7PM-7AM, please contact night-coverage If you still have difficulty reaching the attending provider, please page the [facility] (Director on Call) for [facility] on amion for assistance.

## 2019-06-11 NOTE — Evaluation (Signed)
Physical Therapy Evaluation Patient Details Name: [name] MRN: [id number] DOB: 14-Jul-1931 Today's Date: 06/11/2019   History of Present Illness  85 y.o. male with Past medical history of A. fib, CAD, sick sinus syndrome S/P PPM implant, chronic diastolic CHF, Parkinson's disease, HTN. CT head unremarkable in a patient who has been having weakness and difficulty walking for at least 2 weeks. Unable to perform MRI brain secondary to pacemaker.  X-ray thoracolumbar spine shows no evidence of acute abnormality or acute fracture and shows diffuse osteopenia.    Clinical Impression  Pt admitted with above diagnosis.  Will follow in acute setting; gait limited to lateral steps along EOB d/t pt incontinentence of stool.  Pt will likely need HHPT at d/c  Pt currently with functional limitations due to the deficits listed below (see PT Problem List). Pt will benefit from skilled PT to increase their independence and safety with mobility to allow discharge to the venue listed below.       Follow Up Recommendations Home health PT    Equipment Recommendations  None recommended by PT    Recommendations for Other Services       Precautions / Restrictions Precautions Precautions: Fall Restrictions Weight Bearing Restrictions: No      Mobility  Bed Mobility Overal bed mobility: Needs Assistance Bed Mobility: Supine to Sit;Sit to Supine           General bed mobility comments: assist with trunk to upright  Transfers Overall transfer level: Needs assistance Equipment used: Rolling walker (2 wheeled) Transfers: Sit to/from Stand Sit to Stand: Min assist         General transfer comment: light min assist to rise and transition to RW  Ambulation/Gait             General Gait Details: deferred d/t pt incontinent of stool  Stairs            Wheelchair Mobility    Modified Rankin (Stroke Patients Only)       Balance Overall balance assessment: Needs  assistance Sitting-balance support: Feet supported;Feet unsupported;Bilateral upper extremity supported Sitting balance-Leahy Scale: Fair Sitting balance - Comments: able to sit statically with close supervision, no UE support     Standing balance-Leahy Scale: Poor Standing balance comment: reliant on UEs                             Pertinent Vitals/Pain Pain Assessment: No/denies pain    Home Living Family/patient expects to be discharged to:: Unsure Living Arrangements: Spouse/significant other Available Help at Discharge: Family Type of Home: House Home Access: Ramped entrance     Home Layout: Two level;Able to live on main level with bedroom/bathroom Home Equipment: Walker - 4 wheels;Bedside commode;Wheelchair - manual      Prior Function Level of Independence: Needs assistance   Gait / Transfers Assistance Needed: amb with rollator  ADL's / Homemaking Assistance Needed: assist mostly d/t low vision per wife        Hand Dominance        Extremity/Trunk Assessment   Upper Extremity Assessment Upper Extremity Assessment: Overall WFL for tasks assessed    Lower Extremity Assessment Lower Extremity Assessment: Overall WFL for tasks assessed       Communication   Communication: No difficulties  Cognition Arousal/Alertness: Awake/alert Behavior During Therapy: WFL for tasks assessed/performed Overall Cognitive Status: Within Functional Limits for tasks assessed  General Comments: sleepy      General Comments      Exercises     Assessment/Plan    PT Assessment Patient needs continued PT services  PT Problem List Decreased activity tolerance;Decreased balance;Decreased mobility       PT Treatment Interventions DME instruction;Gait training;Functional mobility training;Patient/family education;Therapeutic activities;Therapeutic exercise    PT Goals (Current goals can be found in the Care Plan  section)  Acute Rehab PT Goals Patient Stated Goal: get better and go home PT Goal Formulation: With patient Time For Goal Achievement: 06/25/19 Potential to Achieve Goals: Good    Frequency Min 3X/week   Barriers to discharge        Co-evaluation               AM-PAC PT "6 Clicks" Mobility  Outcome Measure Help needed turning from your back to your side while in a flat bed without using bedrails?: A Little Help needed moving from lying on your back to sitting on the side of a flat bed without using bedrails?: A Little Help needed moving to and from a bed to a chair (including a wheelchair)?: A Little Help needed standing up from a chair using your arms (e.g., wheelchair or bedside chair)?: A Little Help needed to walk in hospital room?: A Little Help needed climbing 3-5 steps with a railing? : A Little 6 Click Score: 18    End of Session Equipment Utilized During Treatment: Gait belt Activity Tolerance: Patient tolerated treatment well Patient left: with call bell/phone within reach(edge of stretcher with nursing staff)   PT Visit Diagnosis: Difficulty in walking, not elsewhere classified (R26.2)    Time: [id number] PT Time Calculation (min) (ACUTE ONLY): 19 min   Charges:   PT Evaluation $PT Eval Low Complexity: 1 Low          Kenyon Ana, PT  Pager: [phone] Acute Rehab Dept [facility]): [id number]   06/11/2019   [facility] 06/11/2019, 4:38 PM

## 2019-06-11 NOTE — ED Notes (Signed)
No yellow label used d/t system saying his account was locked.

## 2019-06-12 ENCOUNTER — Observation Stay (HOSPITAL_COMMUNITY): Payer: Medicare Other

## 2019-06-12 DIAGNOSIS — R739 Hyperglycemia, unspecified: Secondary | ICD-10-CM

## 2019-06-12 DIAGNOSIS — R531 Weakness: Secondary | ICD-10-CM | POA: Diagnosis present

## 2019-06-12 DIAGNOSIS — R200 Anesthesia of skin: Secondary | ICD-10-CM | POA: Diagnosis present

## 2019-06-12 DIAGNOSIS — K210 Gastro-esophageal reflux disease with esophagitis: Secondary | ICD-10-CM | POA: Diagnosis not present

## 2019-06-12 DIAGNOSIS — I495 Sick sinus syndrome: Secondary | ICD-10-CM | POA: Diagnosis present

## 2019-06-12 DIAGNOSIS — M549 Dorsalgia, unspecified: Secondary | ICD-10-CM | POA: Diagnosis present

## 2019-06-12 DIAGNOSIS — H5462 Unqualified visual loss, left eye, normal vision right eye: Secondary | ICD-10-CM | POA: Diagnosis present

## 2019-06-12 DIAGNOSIS — Z8249 Family history of ischemic heart disease and other diseases of the circulatory system: Secondary | ICD-10-CM | POA: Diagnosis not present

## 2019-06-12 DIAGNOSIS — D72829 Elevated white blood cell count, unspecified: Secondary | ICD-10-CM | POA: Diagnosis present

## 2019-06-12 DIAGNOSIS — I2510 Atherosclerotic heart disease of native coronary artery without angina pectoris: Secondary | ICD-10-CM | POA: Diagnosis present

## 2019-06-12 DIAGNOSIS — I5032 Chronic diastolic (congestive) heart failure: Secondary | ICD-10-CM | POA: Diagnosis present

## 2019-06-12 DIAGNOSIS — R55 Syncope and collapse: Secondary | ICD-10-CM | POA: Diagnosis present

## 2019-06-12 DIAGNOSIS — G20 Parkinson's disease: Secondary | ICD-10-CM | POA: Diagnosis present

## 2019-06-12 DIAGNOSIS — I10 Essential (primary) hypertension: Secondary | ICD-10-CM

## 2019-06-12 DIAGNOSIS — I110 Hypertensive heart disease with heart failure: Secondary | ICD-10-CM | POA: Diagnosis present

## 2019-06-12 DIAGNOSIS — H3530 Unspecified macular degeneration: Secondary | ICD-10-CM | POA: Diagnosis present

## 2019-06-12 DIAGNOSIS — Z85828 Personal history of other malignant neoplasm of skin: Secondary | ICD-10-CM | POA: Diagnosis not present

## 2019-06-12 DIAGNOSIS — Z87891 Personal history of nicotine dependence: Secondary | ICD-10-CM | POA: Diagnosis not present

## 2019-06-12 DIAGNOSIS — R29898 Other symptoms and signs involving the musculoskeletal system: Secondary | ICD-10-CM | POA: Diagnosis not present

## 2019-06-12 DIAGNOSIS — Z9049 Acquired absence of other specified parts of digestive tract: Secondary | ICD-10-CM | POA: Diagnosis not present

## 2019-06-12 DIAGNOSIS — Z20828 Contact with and (suspected) exposure to other viral communicable diseases: Secondary | ICD-10-CM | POA: Diagnosis present

## 2019-06-12 DIAGNOSIS — Z950 Presence of cardiac pacemaker: Secondary | ICD-10-CM | POA: Diagnosis not present

## 2019-06-12 DIAGNOSIS — Z7901 Long term (current) use of anticoagulants: Secondary | ICD-10-CM | POA: Diagnosis not present

## 2019-06-12 DIAGNOSIS — I4821 Permanent atrial fibrillation: Secondary | ICD-10-CM | POA: Diagnosis present

## 2019-06-12 DIAGNOSIS — G8929 Other chronic pain: Secondary | ICD-10-CM | POA: Diagnosis present

## 2019-06-12 DIAGNOSIS — R7989 Other specified abnormal findings of blood chemistry: Secondary | ICD-10-CM | POA: Diagnosis not present

## 2019-06-12 DIAGNOSIS — K219 Gastro-esophageal reflux disease without esophagitis: Secondary | ICD-10-CM | POA: Diagnosis present

## 2019-06-12 LAB — COMPREHENSIVE METABOLIC PANEL
ALT: 6 U/L (ref 0–44)
AST: 16 U/L (ref 15–41)
Albumin: 3.1 g/dL — ABNORMAL LOW (ref 3.5–5.0)
Alkaline Phosphatase: 77 U/L (ref 38–126)
Anion gap: 10 (ref 5–15)
BUN: 16 mg/dL (ref 8–23)
CO2: 21 mmol/L — ABNORMAL LOW (ref 22–32)
Calcium: 8.4 mg/dL — ABNORMAL LOW (ref 8.9–10.3)
Chloride: 107 mmol/L (ref 98–111)
Creatinine, Ser: 0.85 mg/dL (ref 0.61–1.24)
GFR calc Af Amer: >60 mL/min (ref >60)
GFR calc non Af Amer: >60 mL/min (ref >60)
Glucose, Bld: 106 mg/dL — ABNORMAL HIGH (ref 70–99)
Potassium: 4 mmol/L (ref 3.5–5.1)
Sodium: 138 mmol/L (ref 135–145)
Total Bilirubin: 1.1 mg/dL (ref 0.3–1.2)
Total Protein: 6.7 g/dL (ref 6.5–8.1)

## 2019-06-12 LAB — CBC
HCT: 45 % (ref 39.0–52.0)
Hemoglobin: 14.6 g/dL (ref 13.0–17.0)
MCH: 30.2 pg (ref 26.0–34.0)
MCHC: 32.4 g/dL (ref 30.0–36.0)
MCV: 93.2 fL (ref 80.0–100.0)
Platelets: 189 10*3/uL (ref 150–400)
RBC: 4.83 MIL/uL (ref 4.22–5.81)
RDW: 14.3 % (ref 11.5–15.5)
WBC: 10.4 10*3/uL (ref 4.0–10.5)
nRBC: 0 % (ref 0.0–0.2)

## 2019-06-12 LAB — URINE CULTURE
Culture: NO GROWTH
Special Requests: NORMAL

## 2019-06-12 LAB — VITAMIN B12: Vitamin B-12: 575 pg/mL (ref 180–914)

## 2019-06-12 LAB — FOLATE: Folate: 7.6 ng/mL (ref >5.9)

## 2019-06-12 NOTE — TOC Progression Note (Signed)
Transition of Care [facility]) - Progression Note    Patient Details  Name: [name] MRN: [id number] Date of Birth: [birth year]  [facility] CM/SW Contact  Reem Fleury, Juliann Pulse, RN Phone Number: 06/12/2019, 11:45 AM  Clinical Narrative:  06/12/2019 Medicare Home health results [email or web address] ASC&paging=112 1/2 Close window Home health agencies that serve [phone]. [place] Quality of Patient Care Rating Patient Survey Summary Rating [facility] [phone] [place] [phone] [place] [phone] [place] [phone] [place] [phone] [place] [phone] [place] [phone] [facility] [place] [phone] [place] [phone] [place] [phone] [place] [phone] 3 out of 5 stars 4 out of 5 stars 4  out of 5 stars 3 out of 5 stars 4 out of 5 stars 4 out of 5 stars 4 out of 5 stars 4 out of 5 stars 4 out of 5 stars 4 out of 5 stars 4 out of 5 stars 4 out of 5 stars 4 out of 5 stars 4 out of 5 stars 3  out of 5 stars 4 out of 5 stars 3 out of 5 stars 4 out of 5 stars 3 out of 5 stars 4 out of 5 stars 3  out of 5 stars 3 out of 5 stars 06/12/2019 Medicare Home health results CallRank.[email or web address] ASC&paging=112 2/2 [place] of Patient Care Rating Patient Survey Summary Rating [place] [phone] [place] number Footnote as displayed on [place] 4  out of 5 stars 3 out of 5 stars          Expected  Discharge Plan and Services           Expected Discharge Date: (unknown)                                     Social Determinants of Health (SDOH) Interventions    Readmission Risk Interventions No flowsheet data found.

## 2019-06-12 NOTE — TOC Progression Note (Signed)
Transition of Care [facility]) - Progression Note    Patient Details  Name: [name] MRN: [id number] Date of Birth: [birth year]  Transition of Care [facility]) CM/SW Contact  Tahjay Binion, Juliann Pulse, RN Phone Number: 06/12/2019, 3:00 PM  Clinical Narrative: Patient/spouse realized they are using another [place] agency-will contact [facility] to inform already active  w/another agency [facility]-confirmed patient active w/[facility].     Expected Discharge Plan: [place] Barriers to Discharge: Continued Medical Work up  Expected Discharge Plan and Services Expected Discharge Plan: [place]   Discharge Planning Services: CM Consult Post Acute Care Choice: [place] arrangements for the past 2 months: Single Family Home Expected Discharge Date: (unknown)                                     Social Determinants of Health (SDOH) Interventions    Readmission Risk Interventions No flowsheet data found.

## 2019-06-12 NOTE — TOC Initial Note (Signed)
Transition of Care [facility]) - Initial/Assessment Note    Patient Details  Name: SIMRANJIT RAMBERG MRN: [id number] Date of Birth: 1930-12-29  Transition of Care [facility]) CM/SW Contact:    Dessa Phi, RN Phone Number: 06/12/2019, 1:17 PM  Clinical Narrative: Patient recc HHPT-agreed to [facility] aware.                  Expected Discharge Plan: [place] Barriers to Discharge: Continued Medical Work up   Patient Goals and CMS Choice Patient states their goals for this hospitalization and ongoing recovery are:: go home CMS Medicare.gov Compare Post Acute Care list provided to:: Patient Choice offered to / list presented to : Patient  Expected Discharge Plan and Services Expected Discharge Plan: [place]   Discharge Planning Services: CM Consult Post Acute Care Choice: [place] arrangements for the past 2 months: Single Family Home Expected Discharge Date: (unknown)                                    Prior Living Arrangements/Services Living arrangements for the past 2 months: Single Family Home Lives with:: Spouse Patient language and need for interpreter reviewed:: Yes        Need for Family Participation in Patient Care: No (Comment) Care giver support system in place?: Yes (comment) Current home services: DME(rw) Criminal Activity/Legal Involvement Pertinent to Current Situation/Hospitalization: No - Comment as needed  Activities of Daily Living Home Assistive Devices/Equipment: Walker (specify type), Other (Comment)(front wheeled walker, transporter) ADL Screening (condition at time of admission) Patient's cognitive ability adequate to safely complete daily activities?: No Is the patient deaf or have difficulty hearing?: No Does the patient have difficulty seeing, even when wearing glasses/contacts?: Yes(patient has macular degeneration) Does the patient have difficulty concentrating, remembering, or making  decisions?: Yes Patient able to express need for assistance with ADLs?: Yes Does the patient have difficulty dressing or bathing?: Yes Independently performs ADLs?: No Communication: Independent Dressing (OT): Needs assistance Is this a change from baseline?: Pre-admission baseline Grooming: Needs assistance Is this a change from baseline?: Pre-admission baseline Feeding: Needs assistance Is this a change from baseline?: Pre-admission baseline Bathing: Needs assistance(nieces help with bathing patient) Is this a change from baseline?: Pre-admission baseline Toileting: Dependent Is this a change from baseline?: Change from baseline, expected to last >3days In/Out Bed: Dependent Is this a change from baseline?: Change from baseline, expected to last >3 days Walks in Home: Dependent Is this a change from baseline?: Change from baseline, expected to last >3 days Does the patient have difficulty walking or climbing stairs?: Yes(secondary to weakness) Weakness of Legs: Both Weakness of Arms/Hands: Both  Permission Sought/Granted Permission sought to share information with : Case Manager Permission granted to share information with : Yes, Verbal Permission Granted              Emotional Assessment Appearance:: Appears stated age Attitude/Demeanor/Rapport: Gracious Affect (typically observed): Accepting Orientation: : Oriented to Self, Oriented to Place, Oriented to  Time, Oriented to Situation Alcohol / Substance Use: Not Applicable Psych Involvement: No (comment)  Admission diagnosis:  Weakness [R53.1] SOB (shortness of breath) [R06.02] Radiculopathy [M54.10] Unable to walk [R26.2] Parkinson's disease ([place]) [G20] Patient Active Problem List   Diagnosis Date Noted  . Lower extremity numbness 06/12/2019  . Lower extremity weakness 06/11/2019  . Elevated brain natriuretic peptide (BNP) level 06/11/2019  .  Hyperglycemia 06/11/2019  . Leucocytosis 06/11/2019  . Parkinson's  disease ([place]) 11/30/2018  . Pelvic pain 11/30/2018  . Intracranial aneurysm 11/28/2016  . Gait disturbance 11/06/2016  . Muscle fatigue 11/06/2016  . Post herpetic neuralgia 11/06/2016  . Left inguinal hernia 10/06/2014  . Shortness of breath 01/03/2014  . Pacemaker-St.Jude 07/09/2012  . Permanent atrial fibrillation 10/20/2010  . HYPERLIPIDEMIA-MIXED 10/26/2009  . Macular degeneration 12/28/2008  . CAD, NATIVE VESSEL 12/28/2008  . GERD 12/28/2008  . SYNCOPE 12/28/2008  . Hypertension 12/28/2008  . Sick sinus syndrome ([place]) 12/28/2008   PCP:  [name], MD Pharmacy:   [id number] - [place], [place] [address] [place] [address] Phone: [phone] Fax: [phone]  [facility] [place], [place] [address] [place] [address] Phone: [phone] Fax: [phone]     Social Determinants of Health (SDOH) Interventions    Readmission Risk Interventions No flowsheet data found.

## 2019-06-12 NOTE — Plan of Care (Signed)
  Problem: Activity: Goal: Risk for activity intolerance will decrease Outcome: Progressing   Problem: Nutrition: Goal: Adequate nutrition will be maintained Outcome: Progressing   Problem: Pain Managment: Goal: General experience of comfort will improve Outcome: Progressing   

## 2019-06-12 NOTE — Evaluation (Signed)
Occupational Therapy Evaluation Patient Details Name: [name] MRN: [id number] DOB: 10/29/1930 Today's Date: 06/12/2019    History of Present Illness 85 y.o. male with Past medical history of A. fib, CAD, sick sinus syndrome S/P PPM implant, chronic diastolic CHF, Parkinson's disease, HTN.CT head unremarkable in a patient who has been having  weakness and diffiuclty ambulating  for at least 2 weeks. Unable to perform MRI brain secondary to pacemaker.  X-ray thoracolumbar spine shows no evidence of acute abnormality or acute fracture and shows diffuse osteopenia.   Clinical Impression   This 85 yo male admitted with above presents to acute OT with decreased balance thus affecting his safety and independence with basic ADLs. He will benefit from continued acute OT without need for follow up.    Follow Up Recommendations  No OT follow up;Supervision/Assistance - 24 hour    Equipment Recommendations  None recommended by OT       Precautions / Restrictions Precautions Precautions: Fall Restrictions Weight Bearing Restrictions: No      Mobility Bed Mobility Overal bed mobility: Needs Assistance Bed Mobility: Supine to Sit     Supine to sit: Min guard     General bed mobility comments: HOB flat, no rail. Pt used momentum to come up to sit EOB  Transfers Overall transfer level: Needs assistance Equipment used: Rolling walker (2 wheeled) Transfers: Sit to/from Stand Sit to Stand: Min assist         General transfer comment: Pt ambulated with min A to toilet in bathroom, used toilet, stood at sink to wash hands, and ambulated to recliner    Balance Overall balance assessment: Needs assistance Sitting-balance support: No upper extremity supported;Feet supported Sitting balance-Leahy Scale: Fair     Standing balance support: No upper extremity supported;During functional activity Standing balance-Leahy Scale: Fair Standing balance comment: standing at sink to wash  hands (when I asked to try and stand up straight and look in mirror he said, "I had better not, I may fall backwards"                           ADL either performed or assessed with clinical judgement   ADL Overall ADL's : Needs assistance/impaired Eating/Feeding: Independent;Sitting   Grooming: Wash/dry hands;Min guard;Standing   Upper Body Bathing: Supervision/ safety;Set up;Sitting   Lower Body Bathing: Moderate assistance Lower Body Bathing Details (indicate cue type and reason): min A sit<>stand Upper Body Dressing : Set up;Supervision/safety;Sitting   Lower Body Dressing: Maximal assistance Lower Body Dressing Details (indicate cue type and reason): min A sit<>stand Toilet Transfer: Minimal assistance;Ambulation;RW;Regular Toilet;Grab bars   Toileting- Clothing Manipulation and Hygiene: Minimal assistance;Sitting/lateral lean;Sit to/from stand               Vision Baseline Vision/History: Macular Degeneration(left eye worse than right) Patient Visual Report: No change from baseline              Pertinent Vitals/Pain Pain Assessment: No/denies pain     Hand Dominance Right   Extremity/Trunk Assessment Upper Extremity Assessment Upper Extremity Assessment: Generalized weakness           Communication Communication Communication: No difficulties   Cognition Arousal/Alertness: Awake/alert Behavior During Therapy: WFL for tasks assessed/performed Overall Cognitive Status: Within Functional Limits for tasks assessed  Home Living Family/patient expects to be discharged to:: Private residence Living Arrangements: Spouse/significant other Available Help at Discharge: Family;Available 24 hours/day Type of Home: House Home Access: Ramped entrance     Home Layout: Two level;Able to live on main level with bedroom/bathroom     Bathroom Shower/Tub: Walk-in shower;Curtain    Bathroom Toilet: Standard     Home Equipment: Environmental consultant - 4 wheels;Bedside commode;Wheelchair - manual;Grab bars - tub/shower          Prior Functioning/Environment Level of Independence: Needs assistance  Gait / Transfers Assistance Needed: amb with rollator ADL's / Homemaking Assistance Needed: assist mostly d/t low vision per wife            OT Problem List: Decreased strength;Impaired balance (sitting and/or standing);Impaired vision/perception      OT Treatment/Interventions: Self-care/ADL training;DME and/or AE instruction;Patient/family education;Balance training    OT Goals(Current goals can be found in the care plan section) Acute Rehab OT Goals Patient Stated Goal: to be able to go home OT Goal Formulation: With patient/family Time For Goal Achievement: 06/25/19 Potential to Achieve Goals: Good  OT Frequency: Min 2X/week              AM-PAC OT "6 Clicks" Daily Activity     Outcome Measure Help from another person eating meals?: None Help from another person taking care of personal grooming?: A Little Help from another person toileting, which includes using toliet, bedpan, or urinal?: A Little Help from another person bathing (including washing, rinsing, drying)?: A Lot Help from another person to put on and taking off regular upper body clothing?: A Little Help from another person to put on and taking off regular lower body clothing?: A Lot 6 Click Score: 17   End of Session Equipment Utilized During Treatment: Gait belt;Rolling walker Nurse Communication: Mobility status(NT. RN: covering RN to let her know that pt wanted to stay with [facility] agency they had pta)  Activity Tolerance: Patient tolerated treatment well Patient left: in chair;with call bell/phone within reach;with chair alarm set;with family/visitor present  OT Visit Diagnosis: Unsteadiness on feet (R26.81);Other abnormalities of gait and mobility (R26.89);Muscle weakness (generalized) (M62.81);Low  vision, both eyes (H54.2)                Time: [id number] OT Time Calculation (min): 51 min Charges:  OT General Charges $OT Visit: 1 Visit OT Evaluation $OT Eval Moderate Complexity: 1 Mod OT Treatments $Self Care/Home Management : 23-37 mins  [place] [address], OTR/L Acute [employer] Pager [phone] Office [phone]     Almon Register 06/12/2019, 4:32 PM

## 2019-06-12 NOTE — Progress Notes (Signed)
[facility]                                                                              Patient Demographics  [name], is a 85 y.o. male, DOB - [birth year], [id number]  Admit date - 06/10/2019   Admitting Physician Rise Patience, MD  Outpatient Primary MD for the patient is [name], MD  Outpatient specialists:   LOS - 0  days   Medical records reviewed and are as summarized below:    Chief Complaint  Patient presents with  . Numbness    BLE       Brief summary   Patient is a 85 year old male with history of atrial fibrillation, CAD, sick sinus syndrome, status post pacemaker, chronic diastolic CHF history of Parkinson disease, hypertension presented with generalized weakness, fatigue, bilateral numbness in the lower extremities.  Patient reported that since last 2 weeks, he noticed numbness below knees, some weakness and was having difficulty walking, was shuffling around.  Since it did not improve on its own, went to see his PCP at which time he did not have any symptoms so no further work-up was done.  In the last few days, symptoms have come back and also involves thigh area.  Patient reported that he was unable to bear any weight on his legs.  No trauma, injury or falls.  No similar symptoms in upper extremities.  Patient reported some chronic cough and shortness of breath.  Patient is supposed to establish care with Dr. Felecia Shelling, neurology and has appointment on 07/15/19.  CT head negative for any stroke.   Assessment & Plan    Principal Problem:   Acute Lower extremity weakness/numbness -Etiology not clear, on neuro examination, patient has 5/5 strength in bilateral lower extremities and sensation. -Repeat CT head negative for acute CVA.  MRI could not be done due to pacemaker. -X-ray of the thoracolumbar spine showed no evidence of acute abnormality, fracture.  Patient has no point tenderness on vertebral examination. -CK normal, TSH  normal, UA shows pyuria but no acute UTI, asymptomatic.  Electrolytes normal, hemoglobin stable.  No other ascending symptoms. -Follow-up PT OT evaluation.  Also check B12, folate -If patient has difficulty walking, numbness or tingling, will obtain CT of the lumbar spine to rule out lumbar stenosis.  Discussed with Dr. Cheral Marker, no other recommendations at this point, patient will need to follow-up with Dr. Felecia Shelling for further work-up outpatient. -Continue Sinemet, dose appropriate per neurology, no need to change.   Permanent A. fib, history of SSS, status post pacemaker -Currently paced rhythm, not on any rate controlling medications -Continue Eliquis per home regimen  History of essential hypertension BP currently stable  History of chronic diastolic CHF -No acute issues, euvolemic   Code Status: Full code DVT Prophylaxis: Apixaban Family Communication: Discussed all imaging results, lab results, explained to the patient and wife at the bedside   Disposition Plan: Will start PT evaluation today, pending evaluation further work-up  Time Spent in minutes   *40 minutes Procedures:  CT head  Consultants:   Neurology, Dr. Cheral Marker via phone  Antimicrobials:   Anti-infectives (From admission,  onward)   None          Medications  Scheduled Meds: . apixaban  5 mg Oral BID  . carbidopa-levodopa  1 tablet Oral 3 times per day  . memantine  5 mg Oral BID  . sodium chloride flush  3 mL Intravenous Q12H  . vitamin B-12  1,000 mcg Oral Daily   Continuous Infusions: PRN Meds:.acetaminophen **OR** acetaminophen, ondansetron **OR** ondansetron (ZOFRAN) IV, senna-docusate      Subjective:   [name] was seen and examined today.  Denies any tingling sensation in the legs at the time of my examination.  No focal weakness.  Patient denies dizziness, chest pain, shortness of breath, abdominal pain, N/V/D/C.   Objective:   Vitals:   06/11/19 1854 06/11/19 1909 06/11/19  2109 06/12/19 0623  BP: 106/80 106/80 120/89 126/78  Pulse: (!) 58 (!) 58 60 (!) 59  Resp: 18 18 20 18   Temp: (!) 97.5 F (36.4 C) (!) 97.5 F (36.4 C) 97.9 F (36.6 C) 97.8 F (36.6 C)  TempSrc: Oral Oral Oral Oral  SpO2: 96% 96% 94% 95%  Weight:    74.9 kg  Height:        Intake/Output Summary (Last 24 hours) at 06/12/2019 1128 Last data filed at 06/12/2019 0607 Gross per 24 hour  Intake 60 ml  Output 700 ml  Net -640 ml     Wt Readings from Last 3 Encounters:  06/12/19 74.9 kg  11/29/18 78.5 kg  10/21/18 80.3 kg     Exam  General: Alert and oriented x 3, NAD  Eyes:   HEENT:  Atraumatic, normocephalic  Cardiovascular: S1 S2 auscultated, no murmurs, RRR  Respiratory: Clear to auscultation bilaterally, no wheezing, rales or rhonchi  Gastrointestinal: Soft, nontender, nondistended, + bowel sounds  Ext: no pedal edema bilaterally  Neuro: Strength 5/5 upper and lower extremities bilaterally  Musculoskeletal: No digital cyanosis, clubbing  Skin: No rashes  Psych: Normal affect and demeanor, alert and oriented x3    Data Reviewed:  I have personally reviewed following labs and imaging studies  Micro Results Recent Results (from the past 240 hour(s))  Urine culture     Status: None   Collection Time: 06/11/19  3:20 AM   Specimen: Urine, Clean Catch  Result Value Ref Range Status   Specimen Description   Final    URINE, CLEAN CATCH Performed at [facility], [place] [address], [place], [place] [address]    Special Requests   Final    Normal Performed at [facility], [place] [address], [place], [place] [address]    Culture   Final    NO GROWTH Performed at [facility], [place] [address], [place], [place] [address]    Report Status 06/12/2019 FINAL  Final  SARS CORONAVIRUS 2 (TAT 6-24 HRS) Nasopharyngeal Nasopharyngeal Swab     Status: None   Collection Time: 06/11/19  7:48 AM   Specimen: Nasopharyngeal Swab   Result Value Ref Range Status   SARS Coronavirus 2 NEGATIVE NEGATIVE Final    Comment: (NOTE) SARS-CoV-2 target nucleic acids are NOT DETECTED. The SARS-CoV-2 RNA is generally detectable in upper and lower respiratory specimens during the acute phase of infection. Negative results do not preclude SARS-CoV-2 infection, do not rule out co-infections with other pathogens, and should not be used as the sole basis for treatment or other patient management decisions. Negative results must be combined with clinical observations, patient history, and epidemiological information. The  expected result is Negative. Fact Sheet for Patients: SugarRoll.be Fact Sheet for Healthcare Providers: https://www.woods-mathews.com/ This test is not yet approved or cleared by the [place] FDA and  has been authorized for detection and/or diagnosis of SARS-CoV-2 by FDA under an Emergency Use Authorization (EUA). This EUA will remain  in effect (meaning this test can be used) for the duration of the COVID-19 declaration under Section 56 4(b)(1) of the Act, 21 U.S.C. section 360bbb-3(b)(1), unless the authorization is terminated or revoked sooner. Performed at [facility], [place] [address], [place], [place] [address]     Radiology Reports Dg Chest 2 View  Result Date: 06/11/2019 CLINICAL DATA:  Weakness EXAM: CHEST - 2 VIEW COMPARISON:  June 11, 2019 FINDINGS: There is mild cardiomegaly. There is hyperinflation of the lung zones. There is mildly increased interstitial markings seen at both lungs. There is a hazy airspace opacity seen in the left upper lung within the region of the costochondral junction, likely due to overlap of shadows. Healed rib fracture deformity seen in the right thorax. A left pacemaker is seen with the lead tips in the right atrium right ventricle. IMPRESSION: No active cardiopulmonary disease. Electronically Signed   By: Prudencio Pair M.D.   On: 06/11/2019 09:58   Dg Thoracolumabar Spine  Result Date: 06/11/2019 CLINICAL DATA:  Leg pain EXAM: THORACOLUMBAR SPINE 1V COMPARISON:  November 29, 2018 FINDINGS: There is been prior cement fixation of the L1 vertebral body. Disc height loss with facet arthrosis most notable at L5-S1. There is diffuse osteopenia. No acute fracture. Surgical clips are seen overlying mid abdomen. IMPRESSION: No acute fracture or malalignment. Electronically Signed   By: Prudencio Pair M.D.   On: 06/11/2019 09:56   Ct Head Wo Contrast  Result Date: 06/12/2019 CLINICAL DATA:  Ataxia. EXAM: CT HEAD WITHOUT CONTRAST TECHNIQUE: Contiguous axial images were obtained from the base of the skull through the vertex without intravenous contrast. COMPARISON:  CT head from yesterday. FINDINGS: Brain: No evidence of acute infarction, hemorrhage, hydrocephalus, extra-axial collection or mass lesion/mass effect. Stable atrophy and chronic microvascular ischemic changes. Chronic lacunar infarct in the right basal ganglia again noted. Vascular: Atherosclerotic vascular calcification of the carotid siphons. No hyperdense vessel. Skull: Normal. Negative for fracture or focal lesion. Sinuses/Orbits: No acute finding. Unchanged moderate paranasal sinus disease. Other: None. IMPRESSION: 1.  No acute intracranial abnormality. 2. Stable atrophy and chronic microvascular ischemic changes. Electronically Signed   By: Titus Dubin M.D.   On: 06/12/2019 09:30   Ct Head Wo Contrast  Result Date: 06/11/2019 CLINICAL DATA:  Unable to walk for 2 days. Bilateral lower extremity numbness EXAM: CT HEAD WITHOUT CONTRAST TECHNIQUE: Contiguous axial images were obtained from the base of the skull through the vertex without intravenous contrast. COMPARISON:  08/25/2017 FINDINGS: Brain: No evidence of acute infarction, hemorrhage, hydrocephalus, extra-axial collection or mass lesion/mass effect. Confluent low-density in the deep cerebral white  matter attributed to chronic small vessel ischemia. 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 Central predominant atrophy. No significant change compared to 2018 Vascular: Atherosclerotic calcification Skull: Negative for fracture or bone lesion Sinuses/Orbits: Moderate mucosal thickening and bilateral ethmoid sinuses, incidental to the history. IMPRESSION: 1. No acute finding. 2. Atrophy and chronic small vessel ischemia that is stable from 2018. Electronically Signed   By: Monte Fantasia M.D.   On: 06/11/2019 05:08   Dg Chest Port 1 View  Result Date: 06/11/2019 CLINICAL DATA:  85 year old male with shortness of breath.  EXAM: PORTABLE CHEST 1 VIEW COMPARISON:  Chest radiograph dated 08/25/2017 FINDINGS: The patient is slightly rotated. There is diffuse interstitial coarsening and bibasilar atelectatic changes/scarring. An area of opacity in the right hilar region likely projection of mediastinum due to patient's rotation. Repeat radiograph with repositioning of the patient may provide better evaluation. There is no pleural effusion pneumothorax. Stable cardiomegaly. Left pectoral pacemaker device. No acute osseous pathology. IMPRESSION: No acute cardiopulmonary process. Electronically Signed   By: Anner Crete M.D.   On: 06/11/2019 00:27    Lab Data:  CBC: Recent Labs  Lab 06/10/19 2337 06/12/19 0512  WBC 12.4* 10.4  NEUTROABS 8.9*  --   HGB 14.6 14.6  HCT 44.6 45.0  MCV 92.7 93.2  PLT 186 [id number]   Basic Metabolic Panel: Recent Labs  Lab 06/10/19 2337 06/12/19 0512  NA 137 138  K 3.8 4.0  CL 106 107  CO2 22 21*  GLUCOSE 127* 106*  BUN 16 16  CREATININE 0.93 0.85  CALCIUM 8.4* 8.4*  MG 2.2  --    GFR: Estimated Creatinine Clearance: 66.1 mL/min (by C-G formula based on SCr of 0.85 mg/dL). Liver Function Tests: Recent Labs  Lab 06/10/19 2337 06/12/19 0512  AST 18 16  ALT 7 6  ALKPHOS 89 77  BILITOT 1.3* 1.1  PROT 7.0 6.7  ALBUMIN 3.4* 3.1*   No results  for input(s): LIPASE, AMYLASE in the last 168 hours. No results for input(s): AMMONIA in the last 168 hours. Coagulation Profile: No results for input(s): INR, PROTIME in the last 168 hours. Cardiac Enzymes: Recent Labs  Lab 06/11/19 0640  CKTOTAL 95   BNP (last 3 results) No results for input(s): PROBNP in the last 8760 hours. HbA1C: No results for input(s): HGBA1C in the last 72 hours. CBG: No results for input(s): GLUCAP in the last 168 hours. Lipid Profile: No results for input(s): CHOL, HDL, LDLCALC, TRIG, CHOLHDL, LDLDIRECT in the last 72 hours. Thyroid Function Tests: Recent Labs    06/11/19 0640  TSH 1.759   Anemia Panel: No results for input(s): VITAMINB12, FOLATE, FERRITIN, TIBC, IRON, RETICCTPCT in the last 72 hours. Urine analysis:    Component Value Date/Time   COLORURINE YELLOW 06/11/2019 0004   APPEARANCEUR CLEAR 06/11/2019 0004   LABSPEC 1.026 06/11/2019 0004   PHURINE 6.0 06/11/2019 0004   GLUCOSEU NEGATIVE 06/11/2019 0004   HGBUR SMALL (A) 06/11/2019 0004   BILIRUBINUR NEGATIVE 06/11/2019 0004   KETONESUR 5 (A) 06/11/2019 0004   PROTEINUR NEGATIVE 06/11/2019 0004   UROBILINOGEN 1.0 07/30/2014 0920   NITRITE NEGATIVE 06/11/2019 0004   LEUKOCYTESUR NEGATIVE 06/11/2019 0004       M.D. [facility] 06/12/2019, 11:28 AM  Pager: [phone] Between 7am to 7pm - call Pager - [phone]  After 7pm go to [email or web address] - password [email or web address]  Call night coverage person covering after 7pm

## 2019-06-13 DIAGNOSIS — R200 Anesthesia of skin: Secondary | ICD-10-CM

## 2019-06-13 DIAGNOSIS — I2510 Atherosclerotic heart disease of native coronary artery without angina pectoris: Secondary | ICD-10-CM

## 2019-06-13 LAB — CBC
HCT: 43.6 % (ref 39.0–52.0)
Hemoglobin: 13.7 g/dL (ref 13.0–17.0)
MCH: 29.5 pg (ref 26.0–34.0)
MCHC: 31.4 g/dL (ref 30.0–36.0)
MCV: 93.8 fL (ref 80.0–100.0)
Platelets: 204 10*3/uL (ref 150–400)
RBC: 4.65 MIL/uL (ref 4.22–5.81)
RDW: 14.1 % (ref 11.5–15.5)
WBC: 7.6 10*3/uL (ref 4.0–10.5)
nRBC: 0 % (ref 0.0–0.2)

## 2019-06-13 LAB — BASIC METABOLIC PANEL
Anion gap: 10 (ref 5–15)
BUN: 19 mg/dL (ref 8–23)
CO2: 22 mmol/L (ref 22–32)
Calcium: 8.5 mg/dL — ABNORMAL LOW (ref 8.9–10.3)
Chloride: 108 mmol/L (ref 98–111)
Creatinine, Ser: 0.85 mg/dL (ref 0.61–1.24)
GFR calc Af Amer: >60 mL/min (ref >60)
GFR calc non Af Amer: >60 mL/min (ref >60)
Glucose, Bld: 104 mg/dL — ABNORMAL HIGH (ref 70–99)
Potassium: 3.7 mmol/L (ref 3.5–5.1)
Sodium: 140 mmol/L (ref 135–145)

## 2019-06-13 NOTE — Discharge Summary (Signed)
Physician Discharge Summary   Patient ID: [name] MRN: [id number] DOB/AGE: 1930/09/19 85 y.o.  Admit date: 06/10/2019 Discharge date: 06/13/2019  Primary Care Physician:  [name], MD   Recommendations for Outpatient Follow-up:  1. Follow up with PCP in 1-2 weeks 2. Patient has an appointment with Dr. Felecia Shelling on 10/28, neurology  Home Health: None  Equipment/Devices:   Discharge Condition: stable CODE STATUS: FULL  Diet recommendation:    Discharge Diagnoses:    . Lower extremity weakness . GERD . CAD, NATIVE VESSEL . Hypertension . Permanent atrial fibrillation . Pacemaker-St.[employer]ude . Sick sinus syndrome ([place]) . Parkinson's disease ([place]) . Lower extremity numbness   Consults: Neurology via phone consultation    Allergies:   Allergies  Allergen Reactions  . Hydrocodone     "Made him feel bad"  . Hydroxyzine Anxiety     DISCHARGE MEDICATIONS: Allergies as of 06/13/2019      Reactions   Hydrocodone    "Made him feel bad"   Hydroxyzine Anxiety      Medication List    TAKE these medications   apixaban 5 MG Tabs tablet Commonly known as: Eliquis Take 1 tablet (5 mg total) by mouth 2 (two) times daily.   carbidopa-levodopa 25-100 MG tablet Commonly known as: Sinemet Take 1 tablet by mouth 3 (three) times daily. What changed: additional instructions   famotidine 20 MG tablet Commonly known as: PEPCID Take 20 mg by mouth daily.   Magnesium 250 MG Tabs Take 250 mg by mouth every other day.   memantine 5 MG tablet Commonly known as: NAMENDA Take 5 mg by mouth 2 (two) times daily.   triamcinolone cream 0.1 % Commonly known as: KENALOG Apply 1 application topically 3 (three) times daily.   vitamin B-12 1000 MCG tablet Commonly known as: CYANOCOBALAMIN Take 1,000 mcg by mouth daily.   Vitamin D3 50 MCG (2000 UT) Tabs Take 2,000 Units by mouth daily.        Brief H and P: For complete details please refer to admission H and P, but  in brief Patient is a 85 year old male with history of atrial fibrillation, CAD, sick sinus syndrome, status post pacemaker, chronic diastolic CHF history of Parkinson disease, hypertension presented with generalized weakness, fatigue, bilateral numbness in the lower extremities.  Patient reported that since last 2 weeks, he noticed numbness below knees, some weakness and was having difficulty walking, was shuffling around.  Since it did not improve on its own, went to see his PCP at which time he did not have any symptoms so no further work-up was done.  In the last few days, symptoms have come back and also involves thigh area.  Patient reported that he was unable to bear any weight on his legs.  No trauma, injury or falls.  No similar symptoms in upper extremities.  Patient reported some chronic cough and shortness of breath.  Patient is supposed to establish care with Dr. Felecia Shelling, neurology and has appointment on 07/15/19.  CT head negative for any stroke.   Hospital Course:   Acute Lower extremity weakness/numbness -Etiology not clear, on neuro examination, patient has 5/5 strength in bilateral lower extremities and sensation. Repeat CT head negative for acute CVA.  MRI could not be done due to pacemaker. -X-ray of the thoracolumbar spine showed no evidence of acute abnormality, fracture.  Patient has no point tenderness on vertebral examination. -CK normal, TSH normal, UA shows pyuria but no acute UTI, asymptomatic.  Electrolytes normal, hemoglobin  stable.  No other ascending symptoms. -B12, folate within normal limits. -PT OT evaluation done, no OT follow-up needed Home health PT recommended.  Patient has home health scheduled, arranged by PCP. - Discussed with Dr. Cheral Marker, no other recommendations at this point, patient will need to follow-up with Dr. Felecia Shelling for further work-up outpatient. -Continue Sinemet, dose appropriate per neurology, no need to change. -Patient now ambulating without any  difficulty with rolling walker.   Permanent A. fib, history of SSS, status post pacemaker -Currently paced rhythm, not on any rate controlling medications -Continue Eliquis per home regimen  History of essential hypertension BP currently stable  History of chronic diastolic CHF -No acute issues, euvolemic   Day of Discharge S: Ambulating without any difficulty with his rolling walker.  Wife at the bedside, appears back to his baseline.  BP 106/67 (BP Location: Left Arm)   Pulse 60   Temp 98.1 F (36.7 C) (Oral)   Resp 18   Ht 5\' 11"  (1.803 m)   Wt 74.9 kg   SpO2 95%   BMI 23.03 kg/m   Physical Exam: General: Alert and awake oriented x3 not in any acute distress. HEENT: anicteric sclera, pupils reactive to light and accommodation CVS: S1-S2 clear no murmur rubs or gallops Chest: clear to auscultation bilaterally, no wheezing rales or rhonchi Abdomen: soft nontender, nondistended, normal bowel sounds Extremities: no cyanosis, clubbing or edema noted bilaterally Neuro: Cranial nerves II-XII intact, no focal neurological deficits   The results of significant diagnostics from this hospitalization (including imaging, microbiology, ancillary and laboratory) are listed below for reference.      Procedures/Studies:  Dg Chest 2 View  Result Date: 06/11/2019 CLINICAL DATA:  Weakness EXAM: CHEST - 2 VIEW COMPARISON:  June 11, 2019 FINDINGS: There is mild cardiomegaly. There is hyperinflation of the lung zones. There is mildly increased interstitial markings seen at both lungs. There is a hazy airspace opacity seen in the left upper lung within the region of the costochondral junction, likely due to overlap of shadows. Healed rib fracture deformity seen in the right thorax. A left pacemaker is seen with the lead tips in the right atrium right ventricle. IMPRESSION: No active cardiopulmonary disease. Electronically Signed   By: Prudencio Pair M.D.   On: 06/11/2019 09:58   Dg  Thoracolumabar Spine  Result Date: 06/11/2019 CLINICAL DATA:  Leg pain EXAM: THORACOLUMBAR SPINE 1V COMPARISON:  November 29, 2018 FINDINGS: There is been prior cement fixation of the L1 vertebral body. Disc height loss with facet arthrosis most notable at L5-S1. There is diffuse osteopenia. No acute fracture. Surgical clips are seen overlying mid abdomen. IMPRESSION: No acute fracture or malalignment. Electronically Signed   By: Prudencio Pair M.D.   On: 06/11/2019 09:56   Ct Head Wo Contrast  Result Date: 06/12/2019 CLINICAL DATA:  Ataxia. EXAM: CT HEAD WITHOUT CONTRAST TECHNIQUE: Contiguous axial images were obtained from the base of the skull through the vertex without intravenous contrast. COMPARISON:  CT head from yesterday. FINDINGS: Brain: No evidence of acute infarction, hemorrhage, hydrocephalus, extra-axial collection or mass lesion/mass effect. Stable atrophy and chronic microvascular ischemic changes. Chronic lacunar infarct in the right basal ganglia again noted. Vascular: Atherosclerotic vascular calcification of the carotid siphons. No hyperdense vessel. Skull: Normal. Negative for fracture or focal lesion. Sinuses/Orbits: No acute finding. Unchanged moderate paranasal sinus disease. Other: None. IMPRESSION: 1.  No acute intracranial abnormality. 2. Stable atrophy and chronic microvascular ischemic changes. Electronically Signed   By: Titus Dubin  M.D.   On: 06/12/2019 09:30   Ct Head Wo Contrast  Result Date: 06/11/2019 CLINICAL DATA:  Unable to walk for 2 days. Bilateral lower extremity numbness EXAM: CT HEAD WITHOUT CONTRAST TECHNIQUE: Contiguous axial images were obtained from the base of the skull through the vertex without intravenous contrast. COMPARISON:  08/25/2017 FINDINGS: Brain: No evidence of acute infarction, hemorrhage, hydrocephalus, extra-axial collection or mass lesion/mass effect. Confluent low-density in the deep cerebral white matter attributed to chronic small vessel  ischemia. 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 Central predominant atrophy. No significant change compared to 2018 Vascular: Atherosclerotic calcification Skull: Negative for fracture or bone lesion Sinuses/Orbits: Moderate mucosal thickening and bilateral ethmoid sinuses, incidental to the history. IMPRESSION: 1. No acute finding. 2. Atrophy and chronic small vessel ischemia that is stable from 2018. Electronically Signed   By: Monte Fantasia M.D.   On: 06/11/2019 05:08   Dg Chest Port 1 View  Result Date: 06/11/2019 CLINICAL DATA:  85 year old male with shortness of breath. EXAM: PORTABLE CHEST 1 VIEW COMPARISON:  Chest radiograph dated 08/25/2017 FINDINGS: The patient is slightly rotated. There is diffuse interstitial coarsening and bibasilar atelectatic changes/scarring. An area of opacity in the right hilar region likely projection of mediastinum due to patient's rotation. Repeat radiograph with repositioning of the patient may provide better evaluation. There is no pleural effusion pneumothorax. Stable cardiomegaly. Left pectoral pacemaker device. No acute osseous pathology. IMPRESSION: No acute cardiopulmonary process. Electronically Signed   By: Anner Crete M.D.   On: 06/11/2019 00:27       LAB RESULTS: Basic Metabolic Panel: Recent Labs  Lab 06/10/19 2337 06/12/19 0512 06/13/19 0550  NA 137 138 140  K 3.8 4.0 3.7  CL 106 107 108  CO2 22 21* 22  GLUCOSE 127* 106* 104*  BUN 16 16 19   CREATININE 0.93 0.85 0.85  CALCIUM 8.4* 8.4* 8.5*  MG 2.2  --   --    Liver Function Tests: Recent Labs  Lab 06/10/19 2337 06/12/19 0512  AST 18 16  ALT 7 6  ALKPHOS 89 77  BILITOT 1.3* 1.1  PROT 7.0 6.7  ALBUMIN 3.4* 3.1*   No results for input(s): LIPASE, AMYLASE in the last 168 hours. No results for input(s): AMMONIA in the last 168 hours. CBC: Recent Labs  Lab 06/10/19 2337 06/12/19 0512 06/13/19 0550  WBC 12.4* 10.4 7.6  NEUTROABS 8.9*  --   --   HGB  14.6 14.6 13.7  HCT 44.6 45.0 43.6  MCV 92.7 93.2 93.8  PLT 186 189 204   Cardiac Enzymes: Recent Labs  Lab 06/11/19 0640  CKTOTAL 95   BNP: Invalid input(s): POCBNP CBG: No results for input(s): GLUCAP in the last 168 hours.    Disposition and Follow-up: Discharge Instructions    Diet general   Complete by: As directed    Increase activity slowly   Complete by: As directed        DISPOSITION: *Home with home health   [place], [place] Follow up.   Specialty: [place] Why: [facility] physical therapy Contact information: [place] [address] [phone]        Britt Bottom, MD Follow up on 07/15/2019.   Specialty: Neurology Why: at 10:30AM Contact information: [place] [address] [phone]        [name], MD. Schedule an appointment as soon as possible for a  visit in 2 week(s).   Specialty: Internal Medicine Contact information: [place] [address] [phone]            Time coordinating discharge:  35 minutes   Signed:   Estill Cotta M.D. [facility] 06/13/2019, 12:48 PM

## 2019-07-09 ENCOUNTER — Telehealth: Payer: Self-pay | Admitting: Neurology

## 2019-07-09 NOTE — Telephone Encounter (Signed)
Spoke with patient's wife regarding confirming 10/28 appointment. She states patient was admitted to the hospital a few weeks ago after this appointment had been made, and he had several tests done. [name] wanted to advise Dr. Felecia Shelling of this.

## 2019-07-15 ENCOUNTER — Ambulatory Visit (INDEPENDENT_AMBULATORY_CARE_PROVIDER_SITE_OTHER): Payer: Medicare Other | Admitting: Neurology

## 2019-07-15 ENCOUNTER — Encounter: Payer: Self-pay | Admitting: Neurology

## 2019-07-15 ENCOUNTER — Other Ambulatory Visit: Payer: Self-pay

## 2019-07-15 VITALS — BP 110/70 | HR 60 | Temp 97.9°F | Ht 71.0 in | Wt 167.5 lb

## 2019-07-15 DIAGNOSIS — R269 Unspecified abnormalities of gait and mobility: Secondary | ICD-10-CM

## 2019-07-15 DIAGNOSIS — Z950 Presence of cardiac pacemaker: Secondary | ICD-10-CM | POA: Diagnosis not present

## 2019-07-15 DIAGNOSIS — R29898 Other symptoms and signs involving the musculoskeletal system: Secondary | ICD-10-CM

## 2019-07-15 DIAGNOSIS — G20 Parkinson's disease: Secondary | ICD-10-CM | POA: Diagnosis not present

## 2019-07-15 DIAGNOSIS — I4821 Permanent atrial fibrillation: Secondary | ICD-10-CM

## 2019-07-15 MED ORDER — CARBIDOPA-LEVODOPA 25-100 MG PO TABS: 1.0000 | ORAL_TABLET | Freq: Four times a day (QID) | ORAL | 11 refills | Status: DC

## 2019-07-15 NOTE — Progress Notes (Signed)
[facility]  PATIENT: [name] DOB: 1931-02-24  REFERRING DOCTOR OR PCP:  Anda Kraft, MD SOURCE: patient, notes from Dr. Wilson Singer, lab results  _________________________________   HISTORICAL  CHIEF COMPLAINT:  Chief Complaint  Patient presents with  . New Patient (Initial Visit)    RM 85 with wife (temp: 97.6). Paper referral for Parkinson's, difficutly walking/legs shaking.   . Gait Problem    Ambulating with walker. No falls in last year.     HISTORY OF PRESENT ILLNESS:   Update 07/15/2019: He is a 85 y.o. man with Afib, CAD, pacemaker for SSS, Parkinson's, gait disturbance  On 9/23/202, he presented to the ED 06/10/2019 with more weakness and numbness in his legs.  CT in ED did not show evidence of CVA.   He feels issues with his gait have been gradually worsening though he felt weaker at the time he went to the ED.   According tohis wife, nothing was different about that day.   No changes in medications.    His wife notes he has spells where walking is worse but this time lasted over a day and most are just for a short time.    He has been on Sinemet since 2018 at a dose of 25/100.     I last saw him 02/08/2017 and first saw him 11/06/2016.  At last visit he was walking well with a walker.    He denies any neck pain.   He denies any new bladder changes and has stable once a day nocturia.     He has a new pacemaker since February and he is followed by cardiology.  He is also on chronic oral anticoagulation with Eliquis.     I reviewed data from his recent hospitalization including the discharge note, laboratory studies, imaging reports.  I personally reviewed the CT scan of the head.  It shows advanced chronic microvascular ischemic changes and moderate atrophy.  There were no acute findings.  FROM 02/08/2017: [name] is an 85 yo man who has had progressive gait dysfunction and worsening balance since early 2017.  He also has postherpetic  neuralgia that has been bothering him more.   He feels his gait is unchanged since his last visit. He has been using a walker. He does not feel any weaker and he notes he can sometimes get out of the chair without using his arms though he usually will use the arms.      At the first visit imaging was ordered and there was concern about an aneurysm.   However, the CTA did not show one.     He denies much weakness but his legs give out if he stands a while. Balance is poor.     With a walker, he has a normal-size stride and turns easily. Without a walker, his stride is reduced.  He denies proximal arm weakness. There are no myalgias.  Bladder function is fine and he denies nocturia.  Urine color has not changed.  He denies lower back pain, leg pain or neck pain.     He had shingles last year in the left flank and has PHN with an itching burning pain on his back.   Pain was very intense and he was placed on Lidoderm by ED recently but it caused a headache.   Hydroxyzone has not helped itching.   He was started on amitriptyline 10 mg nightly but felt bad on it so he stopped.  Opiates were  initially prescribed but he did not tolerate them and stopped.   He occasionally takes tramadol at night and it is helping some.     He has macular degeneration.  Glasses do not help his vision.  He had injections for macular degeneration but visoin has slowly worsened.     He has Afib and bradycardia and a pacemaker has been placed.      Head CT from 11/21/2016 and CT angiogram 12/07/2016 were reviewed. I concur with the official interpretations.  REVIEW OF SYSTEMS: Constitutional: No fevers, chills, sweats, or change in appetite.   Some fatigue Eyes: No visual changes, double vision, eye pain Ear, nose and throat: No hearing loss, ear pain, nasal congestion, sore throat Cardiovascular: has pacemaker for Afib/bradycardia.  No chest pain, palpitations Respiratory: No shortness of breath at rest or with exertion.   No  wheezes GastrointestinaI: No nausea, vomiting, diarrhea, abdominal pain, fecal incontinence Genitourinary: No dysuria, urinary retention or frequency.  No nocturia. Musculoskeletal: No neck pain, back pain Integumentary: No rash, pruritus, skin lesions Neurological: as above Psychiatric: No depression at this time.  No anxiety Endocrine: No palpitations, diaphoresis, change in appetite, change in weigh or increased thirst Hematologic/Lymphatic: No anemia, purpura, petechiae. Allergic/Immunologic: No itchy/runny eyes, nasal congestion, recent allergic reactions, rashes  ALLERGIES: Allergies  Allergen Reactions  . Hydrocodone     "Made him feel bad"  . Hydroxyzine Anxiety    HOME MEDICATIONS:  Current Outpatient Medications:  .  acetaminophen (TYLENOL) 325 MG tablet, Take 650 mg by mouth every 6 (six) hours as needed., Disp: , Rfl:  .  apixaban (ELIQUIS) 5 MG TABS tablet, Take 1 tablet (5 mg total) by mouth 2 (two) times daily., Disp: 180 tablet, Rfl: 0 .  carbidopa-levodopa (SINEMET) 25-100 MG tablet, Take 1 tablet by mouth 4 (four) times daily., Disp: 120 tablet, Rfl: 11 .  Cholecalciferol (VITAMIN D3) 50 MCG (2000 UT) TABS, Take 2,000 Units by mouth daily., Disp: , Rfl:  .  ELDERBERRY PO, Take 500 mg by mouth daily., Disp: , Rfl:  .  famotidine (PEPCID) 20 MG tablet, Take 20 mg by mouth daily., Disp: , Rfl:  .  Magnesium 250 MG TABS, Take 250 mg by mouth every other day. , Disp: , Rfl:  .  memantine (NAMENDA) 5 MG tablet, Take 5 mg by mouth 2 (two) times daily., Disp: , Rfl:  .  triamcinolone cream (KENALOG) 0.1 %, Apply 1 application topically 3 (three) times daily. , Disp: , Rfl:  .  vitamin B-12 (CYANOCOBALAMIN) 1000 MCG tablet, Take 1,000 mcg by mouth daily., Disp: , Rfl:   PAST MEDICAL HISTORY: Past Medical History:  Diagnosis Date  . CAD (coronary artery disease) 09/2008  . Cancer ([place])    basal cell carcinoma  . CHF (congestive heart failure) ([place])   . Complication  of anesthesia    06/12/11 anesthesia made legs asleep also and went into chf and ended up in ICU after gallbladder surgery  . Diastolic dysfunction    hx of chf due to fluid overload 9/12   . Dysrhythmia    atrial fibrillation  . GERD (gastroesophageal reflux disease)   . History of blood product transfusion    d/t bleeding ulcer  . Hypertension   . Macular degeneration (senile) of retina   . Persistent atrial fibrillation ([place])   . PONV (postoperative nausea and vomiting)   . Presence of permanent cardiac pacemaker 2010  . Shortness of breath dyspnea    exertion  .  Sick sinus syndrome (HCC)    St. Jude  . Syncope    resolved s/p PPM    PAST SURGICAL HISTORY: Past Surgical History:  Procedure Laterality Date  . CHOLECYSTECTOMY  06/12/11  . INGUINAL HERNIA REPAIR Left 10/06/2014   Procedure: OPEN REPAIR LEFT INGUINAL HERNIA ;  Surgeon: Fanny Skates, MD;  Location: [facility];  Service: General;  Laterality: Left;  . INSERTION OF MESH Left 10/06/2014   Procedure: INSERTION OF MESH;  Surgeon: Fanny Skates, MD;  Location: [facility];  Service: General;  Laterality: Left;  . IR KYPHO LUMBAR INC FX REDUCE BONE BX UNI/BIL CANNULATION INC/IMAGING  12/26/2017  . IR RADIOLOGIST EVAL & MGMT  12/12/2017  . LAPAROSCOPIC CHOLECYSTECTOMY W/ CHOLANGIOGRAPHY  06/12/2011  . PACEMAKER INSERTION  09/28/08   SJM Zephyr XL DR  . perforated ulcer    . PPM GENERATOR CHANGEOUT N/A 10/21/2018   Procedure: PPM GENERATOR CHANGEOUT;  Surgeon: Thompson Grayer, MD;  Location: [facility];  Service: Cardiovascular;  Laterality: N/A;  . right inguinal hernia repair  11/19/2002   with mesh  . TRANSURETHRAL RESECTION OF BLADDER TUMOR  09/06/2011   Procedure: TRANSURETHRAL RESECTION OF BLADDER TUMOR (TURBT);  Surgeon: Ailene Rud, MD;  Location: [facility];  Service: Urology;  Laterality: N/A;  . TRANSURETHRAL RESECTION OF PROSTATE  [id number]  . umblical hernia  [id number]    FAMILY HISTORY: Family History   Problem Relation Age of Onset  . Heart attack Mother 32       MI  . Heart failure Father   . Heart attack Brother        Late 50s  . Cancer Sister     SOCIAL HISTORY:  Social History   Socioeconomic History  . Marital status: Married    Spouse name: Not on file  . Number of children: 0  . Years of education: 38  . Highest education level: Not on file  Occupational History  . Not on file  Social Needs  . Financial resource strain: Not on file  . Food insecurity    Worry: Not on file    Inability: Not on file  . Transportation needs    Medical: Not on file    Non-medical: Not on file  Tobacco Use  . Smoking status: Former Smoker    Quit date: 09/17/1958    Years since quitting: 60.8  . Smokeless tobacco: Never Used  . Tobacco comment: quit 1968  Substance and Sexual Activity  . Alcohol use: No  . Drug use: No  . Sexual activity: Not on file  Lifestyle  . Physical activity    Days per week: Not on file    Minutes per session: Not on file  . Stress: Not on file  Relationships  . Social Herbalist on phone: Not on file    Gets together: Not on file    Attends religious service: Not on file    Active member of club or organization: Not on file    Attends meetings of clubs or organizations: Not on file    Relationship status: Not on file  . Intimate partner violence    Fear of current or ex partner: Not on file    Emotionally abused: Not on file    Physically abused: Not on file    Forced sexual activity: Not on file  Other Topics Concern  . Not on file  Social History Narrative   Lives with wife   Caffeine  use: decaf only mostly   Tea very rarely   Right handed      PHYSICAL EXAM  Vitals:   07/15/19 1025  BP: 110/70  Pulse: 60  Temp: 97.9 F (36.6 C)  SpO2: 93%  Weight: 167 lb 8 oz (76 kg)  Height: 5\' 11"  (1.803 m)    Body mass index is 23.36 kg/m.   General: The patient is well-developed and well-nourished and in no acute  distress.  He appears bradykinetic.   Neurologic Exam  Mental status: The patient is alert and oriented with reduced focus/attention  Speech is normal.  Cranial nerves: Extraocular movements are reduced with upgaze and normal with other days.  Facial symmetry is present. There is good facial sensation to soft touch bilaterally.Facial strength is normal.  Trapezius and sternocleidomastoid strength is normal. No dysarthria is noted.   Mild hearing deficits are noted.  Motor:  Muscle bulk is normal.   Tone is normal. Strength is  5 / 5 in all 4 extremities.   He needs to use 1 arm to stand up from a chair (unable to stand up without using arms) which has progressed compared to previous visit.   Sensory: Sensory testing is intact to touch and vibration sensation in the arms.  Touch sensation is normal in the legs. Vibration sensation is normal at the ankles and reduced by about half at the toes.  Coordination: Cerebellar testing reveals good finger-nose-finger and heel-to-shin bilaterally.  Gait and station: Station is normal.   Gait has a small stride without his walker but stride is close to normal length when he uses his walker. He cannot tandem walk. He has retropulsion with posture disturbed/   Romberg is negative.   Reflexes: Deep tendon reflexes are 2 in arms, 1 at knees and absent at ankles.      DIAGNOSTIC DATA (LABS, IMAGING, TESTING) - I reviewed patient records, labs, notes, testing and imaging myself where available.  Lab Results  Component Value Date   WBC 7.6 06/13/2019   HGB 13.7 06/13/2019   HCT 43.6 06/13/2019   MCV 93.8 06/13/2019   PLT 204 06/13/2019      Component Value Date/Time   NA 140 06/13/2019 0550   NA 143 10/17/2018 1334   K 3.7 06/13/2019 0550   CL 108 06/13/2019 0550   CO2 22 06/13/2019 0550   GLUCOSE 104 (H) 06/13/2019 0550   BUN 19 06/13/2019 0550   BUN 17 10/17/2018 1334   CREATININE 0.85 06/13/2019 0550   CALCIUM 8.5 (L) 06/13/2019 0550   PROT  6.7 06/12/2019 0512   ALBUMIN 3.1 (L) 06/12/2019 0512   AST 16 06/12/2019 0512   ALT 6 06/12/2019 0512   ALKPHOS 77 06/12/2019 0512   BILITOT 1.1 06/12/2019 0512   GFRNONAA >60 06/13/2019 0550   GFRAA >60 06/13/2019 0550   Lab Results  Component Value Date   CHOL  09/27/2008    183        ATP III CLASSIFICATION:  <200     mg/dL   Desirable  200-239  mg/dL   Borderline High  >=240    mg/dL   High          HDL 65 09/27/2008   LDLCALC (H) 09/27/2008    103        Total Cholesterol/HDL:CHD Risk Coronary Heart Disease Risk Table                     Men   Women  1/2 Average Risk   3.4   3.3  Average Risk       5.0   4.4  2 X Average Risk   9.6   7.1  3 X Average Risk  23.4   11.0        Use the calculated Patient Ratio above and the CHD Risk Table to determine the patient's CHD Risk.        ATP III CLASSIFICATION (LDL):  <100     mg/dL   Optimal  100-129  mg/dL   Near or Above                    Optimal  130-159  mg/dL   Borderline  160-189  mg/dL   High  >190     mg/dL   Very High   TRIG 74 09/27/2008   CHOLHDL 2.8 09/27/2008   No results found for: HGBA1C Lab Results  Component Value Date   [id number] 06/11/2019        ASSESSMENT AND PLAN  Parkinson's disease ([place])  Gait disturbance  Weakness of both lower extremities  Pacemaker-St.Jude  Permanent atrial fibrillation   1.    His gait disorder is multifactorial -- Parkinson's, microvascular ischemic changes in brain, arthritis.   He is doing reasonably well with the walker. He is advised to continue to use the walker even inside the home. 2.   Increase the Sinemet to 25/100 qid as that may help the gait some. 3.   Stay active and try to exercise to build up the leg muscles more.  4.   RTC  1 year or sooner if new or worsening neurologic symptoms.  40 minutes face-to-face evaluation with greater than one half the time counseling and coordinating care regarding his gait disorder and parkinsonism.   Richard A. Felecia Shelling, MD, PhD [id number], [id number] AM Certified in Neurology, Clinical Neurophysiology, Sleep Medicine, Pain Medicine and Neuroimaging  [facility] [address], [place], [place] [address] [phone]

## 2019-07-17 ENCOUNTER — Other Ambulatory Visit: Payer: Self-pay | Admitting: Internal Medicine

## 2019-07-17 NOTE — Telephone Encounter (Signed)
Eliquis 5mg  refill request received, pt is 85 yrs old, weight-76kg, Crea-0.85 on 06/13/2019, Diagnosis-Afib, and last seen by Dr. Rayann Heman on 02/02/2019 via Telemedicine. Dose is appropriate based on dosing criteria. Will send in refill to requested pharmacy.

## 2019-07-20 ENCOUNTER — Other Ambulatory Visit: Payer: Self-pay | Admitting: *Deleted

## 2019-07-20 ENCOUNTER — Telehealth: Payer: Self-pay | Admitting: Neurology

## 2019-07-20 MED ORDER — CARBIDOPA-LEVODOPA 25-100 MG PO TABS: 1.0000 | ORAL_TABLET | Freq: Four times a day (QID) | ORAL | 11 refills | Status: DC

## 2019-07-20 NOTE — Telephone Encounter (Signed)
Pt wife has called re: the carbidopa-levodopa (SINEMET) 25-100 MG tablet, she has checked with the pharmacy re: the increased amount from 3 to 4 a day they have not received anything.  Please call

## 2019-07-20 NOTE — Telephone Encounter (Signed)
Tried calling wife back, phone continued to ring. If she calls back, please let her know we resent prescription to the pharmacy. Original prescription printed.

## 2019-08-03 ENCOUNTER — Encounter: Payer: Medicare Other | Admitting: *Deleted

## 2019-09-07 ENCOUNTER — Telehealth: Payer: Self-pay | Admitting: Internal Medicine

## 2019-09-07 ENCOUNTER — Ambulatory Visit (INDEPENDENT_AMBULATORY_CARE_PROVIDER_SITE_OTHER): Payer: Medicare Other | Admitting: *Deleted

## 2019-09-07 DIAGNOSIS — I495 Sick sinus syndrome: Secondary | ICD-10-CM

## 2019-09-07 NOTE — Telephone Encounter (Signed)
New message:    Patient wife calling stating that he is now ready for his transmission for his device. Please call patient back.

## 2019-09-07 NOTE — Telephone Encounter (Signed)
I helped the pt wife send a transmission. I put the pt on schedule as well.

## 2019-09-08 LAB — CUP PACEART REMOTE DEVICE CHECK
Battery Remaining Longevity: 112 mo
Battery Remaining Percentage: 95.5 %
Battery Voltage: 3.01 V
Brady Statistic RV Percent Paced: 99 %
Date Time Interrogation Session: 20201221144740
Implantable Lead Implant Date: 20100112
Implantable Lead Implant Date: 20100112
Implantable Lead Location: 753859
Implantable Lead Location: 753860
Implantable Pulse Generator Implant Date: 20200204
Lead Channel Impedance Value: 330 Ohm
Lead Channel Pacing Threshold Amplitude: 1 V
Lead Channel Pacing Threshold Pulse Width: 0.4 ms
Lead Channel Sensing Intrinsic Amplitude: 8.5 mV
Lead Channel Setting Pacing Amplitude: 2.5 V
Lead Channel Setting Pacing Pulse Width: 0.4 ms
Lead Channel Setting Sensing Sensitivity: 2 mV
Pulse Gen Model: 1272
Pulse Gen Serial Number: 9077470

## 2019-09-08 NOTE — Addendum Note (Signed)
Addended by: Jennette Banker on: 09/08/2019 12:05 PM   Modules accepted: Level of Service

## 2019-09-15 ENCOUNTER — Telehealth: Payer: Self-pay | Admitting: Internal Medicine

## 2019-09-15 NOTE — Telephone Encounter (Signed)
New Message:    Pt is on Eliquis and had a nosebleed last night.. She wants to know if pt can stop his Eliquis for a few days?

## 2019-09-15 NOTE — Telephone Encounter (Signed)
Returned call to wife.  Pt had a minimal nose bleed last night that was easily controlled.  Advised wife NOT to stop eliquis for a small nosebleed.  Advised dry air could have caused nosebleed.  Pt will start using afrin.  Advised to call if any further nose bleeds.  Wife indicates understanding.

## 2019-10-12 ENCOUNTER — Other Ambulatory Visit: Payer: Self-pay

## 2019-10-12 ENCOUNTER — Ambulatory Visit (INDEPENDENT_AMBULATORY_CARE_PROVIDER_SITE_OTHER): Payer: Medicare Other | Admitting: Otolaryngology

## 2019-10-12 VITALS — Temp 97.2°F

## 2019-10-12 DIAGNOSIS — R040 Epistaxis: Secondary | ICD-10-CM

## 2019-10-12 NOTE — Progress Notes (Signed)
HPI: [name] is a 86 y.o. male who presents is referred by his PCP for evaluation of recurrent right-sided epistaxis while on Eliquis.  He has been on Eliquis for a number of years.  He has had frequent nosebleeds over the past couple weeks mostly from the right side.  His wife is able to stop the nosebleeds fairly simply with cotton ball and Afrin.  The last nosebleed he had was last week.  He states that he gets a scab in his nose on the right side and then when he knocks this off or blows it out he has bleeding.  He had no bleeding over the weekend and no bleeding today..  Past Medical History:  Diagnosis Date  . CAD (coronary artery disease) 09/2008  . Cancer ([place])    basal cell carcinoma  . CHF (congestive heart failure) ([place])   . Complication of anesthesia    06/12/11 anesthesia made legs asleep also and went into chf and ended up in ICU after gallbladder surgery  . Diastolic dysfunction    hx of chf due to fluid overload 9/12   . Dysrhythmia    atrial fibrillation  . GERD (gastroesophageal reflux disease)   . History of blood product transfusion    d/t bleeding ulcer  . Hypertension   . Macular degeneration (senile) of retina   . Persistent atrial fibrillation ([place])   . PONV (postoperative nausea and vomiting)   . Presence of permanent cardiac pacemaker 2010  . Shortness of breath dyspnea    exertion  . Sick sinus syndrome (H[facility])    St. Jude  . Syncope    resolved s/p PPM   Past Surgical History:  Procedure Laterality Date  . CHOLECYSTECTOMY  06/12/11  . INGUINAL HERNIA REPAIR Left 10/06/2014   Procedure: OPEN REPAIR LEFT INGUINAL HERNIA ;  Surgeon: Fanny Skates, MD;  Location: [facility];  Service: General;  Laterality: Left;  . INSERTION OF MESH Left 10/06/2014   Procedure: INSERTION OF MESH;  Surgeon: Fanny Skates, MD;  Location: [facility];  Service: General;  Laterality: Left;  . IR KYPHO LUMBAR INC FX REDUCE BONE BX UNI/BIL CANNULATION INC/IMAGING  12/26/2017  . IR  RADIOLOGIST EVAL & MGMT  12/12/2017  . LAPAROSCOPIC CHOLECYSTECTOMY W/ CHOLANGIOGRAPHY  06/12/2011  . PACEMAKER INSERTION  09/28/08   SJM Zephyr XL DR  . perforated ulcer    . PPM GENERATOR CHANGEOUT N/A 10/21/2018   Procedure: PPM GENERATOR CHANGEOUT;  Surgeon: Thompson Grayer, MD;  Location: [facility];  Service: Cardiovascular;  Laterality: N/A;  . right inguinal hernia repair  11/19/2002   with mesh  . TRANSURETHRAL RESECTION OF BLADDER TUMOR  09/06/2011   Procedure: TRANSURETHRAL RESECTION OF BLADDER TUMOR (TURBT);  Surgeon: Ailene Rud, MD;  [facility];  Service: Urology;  Laterality: N/A;  . TRANSURETHRAL RESECTION OF PROSTATE  [id number]  . umblical hernia  [id number]   Social History   Socioeconomic History  . Marital status: Married    Spouse name: Not on file  . Number of children: 0  . Years of education: 54  . Highest education level: Not on file  Occupational History  . Not on file  Tobacco Use  . Smoking status: Former Smoker    Quit date: 09/17/1958    Years since quitting: 61.1  . Smokeless tobacco: Never Used  . Tobacco comment: quit 1968  Substance and Sexual Activity  . Alcohol use: No  . Drug use: No  . Sexual  activity: Not on file  Other Topics Concern  . Not on file  Social History Narrative   Lives with wife   Caffeine use: decaf only mostly   Tea very rarely   Right handed    Social Determinants of Health   Financial Resource Strain:   . Difficulty of Paying Living Expenses: Not on file  Food Insecurity:   . Worried About Charity fundraiser in the Last Year: Not on file  . Ran Out of Food in the Last Year: Not on file  Transportation Needs:   . Lack of Transportation (Medical): Not on file  . Lack of Transportation (Non-Medical): Not on file  Physical Activity:   . Days of Exercise per Week: Not on file  . Minutes of Exercise per Session: Not on file  Stress:   . Feeling of Stress : Not on file  Social Connections:   .  Frequency of Communication with Friends and Family: Not on file  . Frequency of Social Gatherings with Friends and Family: Not on file  . Attends Religious Services: Not on file  . Active Member of Clubs or Organizations: Not on file  . Attends Archivist Meetings: Not on file  . Marital Status: Not on file   Family History  Problem Relation Age of Onset  . Heart attack Mother 24       MI  . Heart failure Father   . Heart attack Brother        Late 36s  . Cancer Sister    Allergies  Allergen Reactions  . Hydrocodone     "Made him feel bad"  . Hydroxyzine Anxiety   Prior to Admission medications   Medication Sig Start Date End Date Taking? Authorizing Provider  acetaminophen (TYLENOL) 325 MG tablet Take 650 mg by mouth every 6 (six) hours as needed.   Yes [provider]  carbidopa-levodopa (SINEMET) 25-100 MG tablet Take 1 tablet by mouth 4 (four) times daily. 07/20/19  Yes Sater, Nanine Means, MD  Cholecalciferol (VITAMIN D3) 50 MCG (2000 UT) TABS Take 2,000 Units by mouth daily.   Yes [provider]  ELDERBERRY PO Take 500 mg by mouth daily.   Yes [provider]  ELIQUIS 5 MG TABS tablet TAKE 1 TABLET TWICE A DAY 07/17/19  Yes Allred, Jeneen Rinks, MD  famotidine (PEPCID) 20 MG tablet Take 20 mg by mouth daily.   Yes [provider]  Magnesium 250 MG TABS Take 250 mg by mouth every other day.    Yes [provider]  memantine (NAMENDA) 5 MG tablet Take 5 mg by mouth 2 (two) times daily. 05/25/19  Yes [provider]  triamcinolone cream (KENALOG) 0.1 % Apply 1 application topically 3 (three) times daily.  05/25/19  Yes [provider]  vitamin B-12 (CYANOCOBALAMIN) 1000 MCG tablet Take 1,000 mcg by mouth daily.   Yes [provider]     Positive ROS: Otherwise negative  All other systems have been reviewed and were otherwise negative with the exception of those mentioned in the HPI and as above.  Physical  Exam: Constitutional: Alert, well-appearing, no acute distress Ears: External ears without lesions or tenderness. Ear canals are clear bilaterally with intact, clear TMs.  Nasal: External nose without lesions. Septum is deviated to the left with a concavity on the right side.  There is some minimal crusting on the right side that was removed in the office but he had no bleeding.  He  blew his nose several times and still had no bleeding.  He does have a small vessel superiorly but this did not bleed when rubbed gently with a Q-tip.  Remaining nasal cavity is clear. Oral: Lips and gums without lesions. Tongue and palate mucosa without lesions. Posterior oropharynx clear. Neck: No palpable adenopathy or masses Respiratory: Breathing comfortably  Skin: No facial/neck lesions or rash noted.  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  Could not identify definite bleeding site of origin in the office today.  Plan: I discussed with Neoma Laming concerning not using his finger to pick at his nose. He will follow-up here for cauterization the next time he has a nosebleed as I am unable to identify definite site of the nosebleeds in the office today to cauterize this.   Radene Journey, MD   CC:

## 2019-10-22 ENCOUNTER — Other Ambulatory Visit: Payer: Self-pay

## 2019-10-22 ENCOUNTER — Ambulatory Visit (INDEPENDENT_AMBULATORY_CARE_PROVIDER_SITE_OTHER): Payer: Medicare Other | Admitting: Otolaryngology

## 2019-10-22 VITALS — Temp 97.2°F

## 2019-10-22 DIAGNOSIS — R040 Epistaxis: Secondary | ICD-10-CM

## 2019-10-22 NOTE — Progress Notes (Signed)
HPI: [name] is a 86 y.o. male who returns today for evaluation of right-sided epistaxis.  Apparently it bled yesterday morning pretty bad but he was able to stop it with a cotton ball with Afrin fairly easily.  He is having no difficulty breathing through his nose.  He does pick at his nose frequently.  He is on Eliquis 5 mg twice daily..  Past Medical History:  Diagnosis Date  . CAD (coronary artery disease) 09/2008  . Cancer ([place])    basal cell carcinoma  . CHF (congestive heart failure) ([place])   . Complication of anesthesia    06/12/11 anesthesia made legs asleep also and went into chf and ended up in ICU after gallbladder surgery  . Diastolic dysfunction    hx of chf due to fluid overload 9/12   . Dysrhythmia    atrial fibrillation  . GERD (gastroesophageal reflux disease)   . History of blood product transfusion    d/t bleeding ulcer  . Hypertension   . Macular degeneration (senile) of retina   . Persistent atrial fibrillation ([place])   . PONV (postoperative nausea and vomiting)   . Presence of permanent cardiac pacemaker 2010  . Shortness of breath dyspnea    exertion  . Sick sinus syndrome (HCC)    St. Jude  . Syncope    resolved s/p PPM   Past Surgical History:  Procedure Laterality Date  . CHOLECYSTECTOMY  06/12/11  . INGUINAL HERNIA REPAIR Left 10/06/2014   Procedure: OPEN REPAIR LEFT INGUINAL HERNIA ;  Surgeon: Fanny Skates, MD;  Location: [facility];  Service: General;  Laterality: Left;  . INSERTION OF MESH Left 10/06/2014   Procedure: INSERTION OF MESH;  Surgeon: Fanny Skates, MD;  Location: [facility];  Service: General;  Laterality: Left;  . IR KYPHO LUMBAR INC FX REDUCE BONE BX UNI/BIL CANNULATION INC/IMAGING  12/26/2017  . IR RADIOLOGIST EVAL & MGMT  12/12/2017  . LAPAROSCOPIC CHOLECYSTECTOMY W/ CHOLANGIOGRAPHY  06/12/2011  . PACEMAKER INSERTION  09/28/08   SJM Zephyr XL DR  . perforated ulcer    . PPM GENERATOR CHANGEOUT N/A 10/21/2018   Procedure: PPM GENERATOR  CHANGEOUT;  Surgeon: Thompson Grayer, MD;  Location: [facility];  Service: Cardiovascular;  Laterality: N/A;  . right inguinal hernia repair  11/19/2002   with mesh  . TRANSURETHRAL RESECTION OF BLADDER TUMOR  09/06/2011   Procedure: TRANSURETHRAL RESECTION OF BLADDER TUMOR (TURBT);  Surgeon: Ailene Rud, MD;  [facility];  Service: Urology;  Laterality: N/A;  . TRANSURETHRAL RESECTION OF PROSTATE  [id number]  . umblical hernia  [id number]   Social History   Socioeconomic History  . Marital status: Married    Spouse name: Not on file  . Number of children: 0  . Years of education: 54  . Highest education level: Not on file  Occupational History  . Not on file  Tobacco Use  . Smoking status: Former Smoker    Quit date: 09/17/1958    Years since quitting: 61.1  . Smokeless tobacco: Never Used  . Tobacco comment: quit 1968  Substance and Sexual Activity  . Alcohol use: No  . Drug use: No  . Sexual activity: Not on file  Other Topics Concern  . Not on file  Social History Narrative   Lives with wife   Caffeine use: decaf only mostly   Tea very rarely   Right handed    Social Determinants of Health   Financial Resource Strain:   .  Difficulty of Paying Living Expenses: Not on file  Food Insecurity:   . Worried About Charity fundraiser in the Last Year: Not on file  . Ran Out of Food in the Last Year: Not on file  Transportation Needs:   . Lack of Transportation (Medical): Not on file  . Lack of Transportation (Non-Medical): Not on file  Physical Activity:   . Days of Exercise per Week: Not on file  . Minutes of Exercise per Session: Not on file  Stress:   . Feeling of Stress : Not on file  Social Connections:   . Frequency of Communication with Friends and Family: Not on file  . Frequency of Social Gatherings with Friends and Family: Not on file  . Attends Religious Services: Not on file  . Active Member of Clubs or Organizations: Not on file  .  Attends Archivist Meetings: Not on file  . Marital Status: Not on file   Family History  Problem Relation Age of Onset  . Heart attack Mother 2       MI  . Heart failure Father   . Heart attack Brother        Late 38s  . Cancer Sister    Allergies  Allergen Reactions  . Hydrocodone     "Made him feel bad"  . Hydroxyzine Anxiety   Prior to Admission medications   Medication Sig Start Date End Date Taking? Authorizing Provider  acetaminophen (TYLENOL) 325 MG tablet Take 650 mg by mouth every 6 (six) hours as needed.   Yes [provider]  carbidopa-levodopa (SINEMET) 25-100 MG tablet Take 1 tablet by mouth 4 (four) times daily. 07/20/19  Yes Sater, Nanine Means, MD  Cholecalciferol (VITAMIN D3) 50 MCG (2000 UT) TABS Take 2,000 Units by mouth daily.   Yes [provider]  ELDERBERRY PO Take 500 mg by mouth daily.   Yes [provider]  ELIQUIS 5 MG TABS tablet TAKE 1 TABLET TWICE A DAY 07/17/19  Yes Allred, Jeneen Rinks, MD  famotidine (PEPCID) 20 MG tablet Take 20 mg by mouth daily.   Yes [provider]  Magnesium 250 MG TABS Take 250 mg by mouth every other day.    Yes [provider]  memantine (NAMENDA) 5 MG tablet Take 5 mg by mouth 2 (two) times daily. 05/25/19  Yes [provider]  triamcinolone cream (KENALOG) 0.1 % Apply 1 application topically 3 (three) times daily.  05/25/19  Yes [provider]  vitamin B-12 (CYANOCOBALAMIN) 1000 MCG tablet Take 1,000 mcg by mouth daily.   Yes [provider]     Positive ROS: Otherwise negative  All other systems have been reviewed and were otherwise negative with the exception of those mentioned in the HPI and as above.  Physical Exam: Constitutional: Alert, well-appearing, no acute distress Ears: External ears without lesions or tenderness. Ear canals are clear bilaterally with intact, clear TMs.  Nasal: External nose without lesions. Septum slightly deviated to  the left.  Has slight concavity on the right side.. Clear nasal passages.  On close examination of the septum on both sides there was no obvious site of origin of the bleeding noted.  He did have one small scab on the right side of the septum that was removed but he had no bleeding from this.  He has several small vessels with no real prominent vessel on the right side of the septum.  He cannot induce any bleeding by blowing his  nose hard or picking at his nose.  Clear examination otherwise. Oral: Lips and gums without lesions. Tongue and palate mucosa without lesions. Posterior oropharynx clear. Neck: No palpable adenopathy or masses Respiratory: Breathing comfortably  Skin: No facial/neck lesions or rash noted.  Procedures  Assessment: Right sided epistaxis.  I cannot identify definite site of origin of his recent epistaxis to cauterize. Patient is on Eliquis  Plan: I discussed with him concerning stopping the nosebleeds with cotton ball as needed. He will follow-up for recheck in cauterization if he has any further epistaxis as needed but will try to get here as soon as possible although takes him 30 minutes to arrive at the office from his home.   Radene Journey, MD

## 2019-10-26 ENCOUNTER — Other Ambulatory Visit: Payer: Self-pay

## 2019-10-26 ENCOUNTER — Ambulatory Visit (INDEPENDENT_AMBULATORY_CARE_PROVIDER_SITE_OTHER): Payer: Medicare Other | Admitting: Otolaryngology

## 2019-10-26 VITALS — Temp 96.4°F

## 2019-10-26 DIAGNOSIS — R040 Epistaxis: Secondary | ICD-10-CM | POA: Diagnosis not present

## 2019-10-26 NOTE — Progress Notes (Signed)
HPI: [name] is a 86 y.o. male who returns today for evaluation of recurrent right-sided epistaxis.  Patient is on Eliquis.  He had a nosebleed last night around 9:00 he was able to stop with a cottonball.  He has had no further bleeding today but presents this morning for recheck of right side of the nostril.  He states that he breathes a little bit better through the left side..  Past Medical History:  Diagnosis Date  . CAD (coronary artery disease) 09/2008  . Cancer ([place])    basal cell carcinoma  . CHF (congestive heart failure) ([place])   . Complication of anesthesia    06/12/11 anesthesia made legs asleep also and went into chf and ended up in ICU after gallbladder surgery  . Diastolic dysfunction    hx of chf due to fluid overload 9/12   . Dysrhythmia    atrial fibrillation  . GERD (gastroesophageal reflux disease)   . History of blood product transfusion    d/t bleeding ulcer  . Hypertension   . Macular degeneration (senile) of retina   . Persistent atrial fibrillation ([place])   . PONV (postoperative nausea and vomiting)   . Presence of permanent cardiac pacemaker 2010  . Shortness of breath dyspnea    exertion  . Sick sinus syndrome (HCC)    St. Jude  . Syncope    resolved s/p PPM   Past Surgical History:  Procedure Laterality Date  . CHOLECYSTECTOMY  06/12/11  . INGUINAL HERNIA REPAIR Left 10/06/2014   Procedure: OPEN REPAIR LEFT INGUINAL HERNIA ;  Surgeon: Fanny Skates, MD;  Location: [facility];  Service: General;  Laterality: Left;  . INSERTION OF MESH Left 10/06/2014   Procedure: INSERTION OF MESH;  Surgeon: Fanny Skates, MD;  Location: [facility];  Service: General;  Laterality: Left;  . IR KYPHO LUMBAR INC FX REDUCE BONE BX UNI/BIL CANNULATION INC/IMAGING  12/26/2017  . IR RADIOLOGIST EVAL & MGMT  12/12/2017  . LAPAROSCOPIC CHOLECYSTECTOMY W/ CHOLANGIOGRAPHY  06/12/2011  . PACEMAKER INSERTION  09/28/08   SJM Zephyr XL DR  . perforated ulcer    . PPM GENERATOR CHANGEOUT  N/A 10/21/2018   Procedure: PPM GENERATOR CHANGEOUT;  Surgeon: Thompson Grayer, MD;  Location: [facility];  Service: Cardiovascular;  Laterality: N/A;  . right inguinal hernia repair  11/19/2002   with mesh  . TRANSURETHRAL RESECTION OF BLADDER TUMOR  09/06/2011   Procedure: TRANSURETHRAL RESECTION OF BLADDER TUMOR (TURBT);  Surgeon: Ailene Rud, MD;  [facility];  Service: Urology;  Laterality: N/A;  . TRANSURETHRAL RESECTION OF PROSTATE  [id number]  . umblical hernia  [id number]   Social History   Socioeconomic History  . Marital status: Married    Spouse name: Not on file  . Number of children: 0  . Years of education: 58  . Highest education level: Not on file  Occupational History  . Not on file  Tobacco Use  . Smoking status: Former Smoker    Quit date: 09/17/1958    Years since quitting: 61.1  . Smokeless tobacco: Never Used  . Tobacco comment: quit 1968  Substance and Sexual Activity  . Alcohol use: No  . Drug use: No  . Sexual activity: Not on file  Other Topics Concern  . Not on file  Social History Narrative   Lives with wife   Caffeine use: decaf only mostly   Tea very rarely   Right handed    Social Determinants of  Health   Financial Resource Strain:   . Difficulty of Paying Living Expenses: Not on file  Food Insecurity:   . Worried About Charity fundraiser in the Last Year: Not on file  . Ran Out of Food in the Last Year: Not on file  Transportation Needs:   . Lack of Transportation (Medical): Not on file  . Lack of Transportation (Non-Medical): Not on file  Physical Activity:   . Days of Exercise per Week: Not on file  . Minutes of Exercise per Session: Not on file  Stress:   . Feeling of Stress : Not on file  Social Connections:   . Frequency of Communication with Friends and Family: Not on file  . Frequency of Social Gatherings with Friends and Family: Not on file  . Attends Religious Services: Not on file  . Active Member of  Clubs or Organizations: Not on file  . Attends Archivist Meetings: Not on file  . Marital Status: Not on file   Family History  Problem Relation Age of Onset  . Heart attack Mother 54       MI  . Heart failure Father   . Heart attack Brother        Late 72s  . Cancer Sister    Allergies  Allergen Reactions  . Hydrocodone     "Made him feel bad"  . Hydroxyzine Anxiety   Prior to Admission medications   Medication Sig Start Date End Date Taking? Authorizing Provider  acetaminophen (TYLENOL) 325 MG tablet Take 650 mg by mouth every 6 (six) hours as needed.    [provider]  carbidopa-levodopa (SINEMET) 25-100 MG tablet Take 1 tablet by mouth 4 (four) times daily. 07/20/19   Sater, Nanine Means, MD  Cholecalciferol (VITAMIN D3) 50 MCG (2000 UT) TABS Take 2,000 Units by mouth daily.    [provider]  ELDERBERRY PO Take 500 mg by mouth daily.    [provider]  ELIQUIS 5 MG TABS tablet TAKE 1 TABLET TWICE A DAY 07/17/19   Allred, Jeneen Rinks, MD  famotidine (PEPCID) 20 MG tablet Take 20 mg by mouth daily.    [provider]  Magnesium 250 MG TABS Take 250 mg by mouth every other day.     [provider]  memantine (NAMENDA) 5 MG tablet Take 5 mg by mouth 2 (two) times daily. 05/25/19   [provider]  triamcinolone cream (KENALOG) 0.1 % Apply 1 application topically 3 (three) times daily.  05/25/19   [provider]  vitamin B-12 (CYANOCOBALAMIN) 1000 MCG tablet Take 1,000 mcg by mouth daily.    [provider]     Positive ROS: Otherwise negative  All other systems have been reviewed and were otherwise negative with the exception of those mentioned in the HPI and as above.  Physical Exam: Constitutional: Alert, well-appearing, no acute distress Ears: External ears without lesions or tenderness. Ear canals are clear bilaterally with intact, clear TMs.  Nasal: External nose without lesions. Septum has a  concavity of the anterior septum on the right side.  No intranasal masses noted.  There are several small vessels around Kiesselbach's plexus but could not identify what vessel had bled.  Even with blowing and rubbing with a Q-tip none of the vessels bled but these were all very small vessels.  I cauterized 3 of 4 of the vessels in the office today using silver nitrate. Oral: Lips and gums without lesions. Tongue and palate  mucosa without lesions. Posterior oropharynx clear. Neck: No palpable adenopathy or masses Respiratory: Breathing comfortably  Skin: No facial/neck lesions or rash noted.  Control of epistaxis  Date/Time: 10/26/2019 9:56 AM Performed by: Rozetta Nunnery, MD Authorized by: Rozetta Nunnery, MD   Consent:    Consent obtained:  Verbal   Consent given by:  Patient   Risks discussed:  Bleeding and pain   Alternatives discussed:  No treatment and observation Anesthesia:    Anesthesia method:  None Procedure details:    Treatment site:  R anterior   Treatment method:  Oxymetazoline and silver nitrate   Treatment complexity:  Limited   Treatment episode: initial   Post-procedure details:    Assessment:  Bleeding stopped   Patient tolerance of procedure:  Tolerated well, no immediate complications Comments:     Several small vessels along Kiesselbach's plexus were cauterized.  Did not identify any major vessel or any vessels on the lateral portion of the nostril.    Assessment: Recurrent right-sided epistaxis  Plan: Reviewed with the patient again concerning using a cotton ball packing if he has any further nosebleeds.  If he has recurrent nosebleeds especially if he can demonstrate bleeding in the office he will follow-up as needed for recurrent cauterization.  Did not actually identify a specific bleeding site in the office today but cauterize several potential bleeding spots.   Radene Journey, MD

## 2019-12-07 ENCOUNTER — Ambulatory Visit (INDEPENDENT_AMBULATORY_CARE_PROVIDER_SITE_OTHER): Payer: Medicare Other | Admitting: *Deleted

## 2019-12-07 DIAGNOSIS — I495 Sick sinus syndrome: Secondary | ICD-10-CM | POA: Diagnosis not present

## 2019-12-07 LAB — CUP PACEART REMOTE DEVICE CHECK
Battery Remaining Longevity: 113 mo
Battery Remaining Percentage: 95.5 %
Battery Voltage: 3.01 V
Brady Statistic RV Percent Paced: 99 %
Date Time Interrogation Session: 20210322020023
Implantable Lead Implant Date: 20100112
Implantable Lead Implant Date: 20100112
Implantable Lead Location: 753859
Implantable Lead Location: 753860
Implantable Pulse Generator Implant Date: 20200204
Lead Channel Impedance Value: 380 Ohm
Lead Channel Pacing Threshold Amplitude: 1 V
Lead Channel Pacing Threshold Pulse Width: 0.4 ms
Lead Channel Sensing Intrinsic Amplitude: 8.5 mV
Lead Channel Setting Pacing Amplitude: 2.5 V
Lead Channel Setting Pacing Pulse Width: 0.4 ms
Lead Channel Setting Sensing Sensitivity: 2 mV
Pulse Gen Model: 1272
Pulse Gen Serial Number: 9077470

## 2019-12-08 NOTE — Progress Notes (Signed)
PPM Remote  

## 2019-12-09 ENCOUNTER — Other Ambulatory Visit: Payer: Self-pay

## 2019-12-09 ENCOUNTER — Ambulatory Visit (INDEPENDENT_AMBULATORY_CARE_PROVIDER_SITE_OTHER): Payer: Medicare Other | Admitting: Otolaryngology

## 2019-12-09 VITALS — Temp 97.5°F

## 2019-12-09 DIAGNOSIS — J342 Deviated nasal septum: Secondary | ICD-10-CM

## 2019-12-09 DIAGNOSIS — R040 Epistaxis: Secondary | ICD-10-CM

## 2019-12-09 NOTE — Progress Notes (Signed)
HPI: [name] is a 86 y.o. male who returns today for evaluation of right-sided epistaxis.  He has had previous cauterization of this area but has been difficult to determine exactly where his bleeding from as he has not had active bleeding when he presents to the office.  He had bleeding this morning and placed a cotton ball in the nose and follows up here.  He has had just minimal bleeding. He is on Eliquis.  Past Medical History:  Diagnosis Date  . CAD (coronary artery disease) 09/2008  . Cancer ([place])    basal cell carcinoma  . CHF (congestive heart failure) ([place])   . Complication of anesthesia    06/12/11 anesthesia made legs asleep also and went into chf and ended up in ICU after gallbladder surgery  . Diastolic dysfunction    hx of chf due to fluid overload 9/12   . Dysrhythmia    atrial fibrillation  . GERD (gastroesophageal reflux disease)   . History of blood product transfusion    d/t bleeding ulcer  . Hypertension   . Macular degeneration (senile) of retina   . Persistent atrial fibrillation ([place])   . PONV (postoperative nausea and vomiting)   . Presence of permanent cardiac pacemaker 2010  . Shortness of breath dyspnea    exertion  . Sick sinus syndrome (HCC)    St. Jude  . Syncope    resolved s/p PPM   Past Surgical History:  Procedure Laterality Date  . CHOLECYSTECTOMY  06/12/11  . INGUINAL HERNIA REPAIR Left 10/06/2014   Procedure: OPEN REPAIR LEFT INGUINAL HERNIA ;  Surgeon: Fanny Skates, MD;  Location: [facility];  Service: General;  Laterality: Left;  . INSERTION OF MESH Left 10/06/2014   Procedure: INSERTION OF MESH;  Surgeon: Fanny Skates, MD;  Location: [facility];  Service: General;  Laterality: Left;  . IR KYPHO LUMBAR INC FX REDUCE BONE BX UNI/BIL CANNULATION INC/IMAGING  12/26/2017  . IR RADIOLOGIST EVAL & MGMT  12/12/2017  . LAPAROSCOPIC CHOLECYSTECTOMY W/ CHOLANGIOGRAPHY  06/12/2011  . PACEMAKER INSERTION  09/28/08   SJM Zephyr XL DR  . perforated ulcer     . PPM GENERATOR CHANGEOUT N/A 10/21/2018   Procedure: PPM GENERATOR CHANGEOUT;  Surgeon: Thompson Grayer, MD;  Location: [facility];  Service: Cardiovascular;  Laterality: N/A;  . right inguinal hernia repair  11/19/2002   with mesh  . TRANSURETHRAL RESECTION OF BLADDER TUMOR  09/06/2011   Procedure: TRANSURETHRAL RESECTION OF BLADDER TUMOR (TURBT);  Surgeon: Ailene Rud, MD;  [facility];  Service: Urology;  Laterality: N/A;  . TRANSURETHRAL RESECTION OF PROSTATE  [id number]  . umblical hernia  [id number]   Social History   Socioeconomic History  . Marital status: Married    Spouse name: Not on file  . Number of children: 0  . Years of education: 76  . Highest education level: Not on file  Occupational History  . Not on file  Tobacco Use  . Smoking status: Former Smoker    Quit date: 09/17/1958    Years since quitting: 61.2  . Smokeless tobacco: Never Used  . Tobacco comment: quit 1968  Substance and Sexual Activity  . Alcohol use: No  . Drug use: No  . Sexual activity: Not on file  Other Topics Concern  . Not on file  Social History Narrative   Lives with wife   Caffeine use: decaf only mostly   Tea very rarely   Right handed  Social Determinants of Health   Financial Resource Strain:   . Difficulty of Paying Living Expenses:   Food Insecurity:   . Worried About Charity fundraiser in the Last Year:   . Arboriculturist in the Last Year:   Transportation Needs:   . Film/video editor (Medical):   [name] Lack of Transportation (Non-Medical):   Physical Activity:   . Days of Exercise per Week:   . Minutes of Exercise per Session:   Stress:   . Feeling of Stress :   Social Connections:   . Frequency of Communication with Friends and Family:   . Frequency of Social Gatherings with Friends and Family:   . Attends Religious Services:   . Active Member of Clubs or Organizations:   . Attends Archivist Meetings:   [name] Marital Status:     Family History  Problem Relation Age of Onset  . Heart attack Mother 81       MI  . Heart failure Father   . Heart attack Brother        Late 58s  . Cancer Sister    Allergies  Allergen Reactions  . Hydrocodone     "Made him feel bad"  . Hydroxyzine Anxiety   Prior to Admission medications   Medication Sig Start Date End Date Taking? Authorizing Provider  acetaminophen (TYLENOL) 325 MG tablet Take 650 mg by mouth every 6 (six) hours as needed.   Yes [provider]  carbidopa-levodopa (SINEMET) 25-100 MG tablet Take 1 tablet by mouth 4 (four) times daily. 07/20/19  Yes Sater, Nanine Means, MD  Cholecalciferol (VITAMIN D3) 50 MCG (2000 UT) TABS Take 2,000 Units by mouth daily.   Yes [provider]  ELDERBERRY PO Take 500 mg by mouth daily.   Yes [provider]  ELIQUIS 5 MG TABS tablet TAKE 1 TABLET TWICE A DAY 07/17/19  Yes Allred, Jeneen Rinks, MD  famotidine (PEPCID) 20 MG tablet Take 20 mg by mouth daily.   Yes [provider]  Magnesium 250 MG TABS Take 250 mg by mouth every other day.    Yes [provider]  memantine (NAMENDA) 5 MG tablet Take 5 mg by mouth 2 (two) times daily. 05/25/19  Yes [provider]  triamcinolone cream (KENALOG) 0.1 % Apply 1 application topically 3 (three) times daily.  05/25/19  Yes [provider]  vitamin B-12 (CYANOCOBALAMIN) 1000 MCG tablet Take 1,000 mcg by mouth daily.   Yes [provider]     Positive ROS: Otherwise negative  All other systems have been reviewed and were otherwise negative with the exception of those mentioned in the HPI and as above.  Physical Exam: Constitutional: Alert, well-appearing, no acute distress Ears: External ears without lesions or tenderness. Ear canals are clear bilaterally with intact, clear TMs.  Nasal: External nose without lesions. Septum slightly deviated to the left..  After removing the cotton ball packing there was no active bleeding  noted.  There was a small blood clot within the nasal cavity inferiorly that was removed with suction.  He had one small area mid superiorly right-sided septum that was cauterized using silver nitrate.  Remaining nasal cavity was clear. Oral: Lips and gums without lesions. Tongue and palate mucosa without lesions. Posterior oropharynx clear. Neck: No palpable adenopathy or masses Respiratory: Breathing comfortably  Skin: No facial/neck lesions or rash noted.  Control of epistaxis  Date/Time: 12/09/2019 12:37 PM Performed by: Rozetta Nunnery, MD  Authorized by: Rozetta Nunnery, MD   Consent:    Consent obtained:  Verbal   Consent given by:  Patient   Risks discussed:  Bleeding and pain   Alternatives discussed:  No treatment and observation Anesthesia:    Anesthesia method:  None Procedure details:    Treatment site:  R anterior   Treatment method:  Silver nitrate   Treatment complexity:  Limited   Treatment episode: recurring   Post-procedure details:    Patient tolerance of procedure:  Tolerated well, no immediate complications Comments:     The right anterior septal area was cauterized using silver nitrate.  He did not have active bleeding noted in the office today after using a cotton ball to control this.    Assessment: Right-sided epistaxis  Plan: This was cauterized using silver nitrate. He will follow-up as needed any further bleeding as needed.   Radene Journey, MD

## 2020-01-22 ENCOUNTER — Encounter: Payer: Self-pay | Admitting: Student

## 2020-01-22 ENCOUNTER — Ambulatory Visit (INDEPENDENT_AMBULATORY_CARE_PROVIDER_SITE_OTHER): Payer: Medicare Other | Admitting: Student

## 2020-01-22 ENCOUNTER — Other Ambulatory Visit: Payer: Self-pay

## 2020-01-22 VITALS — BP 118/68 | HR 60 | Ht 71.0 in | Wt 170.0 lb

## 2020-01-22 DIAGNOSIS — R001 Bradycardia, unspecified: Secondary | ICD-10-CM

## 2020-01-22 DIAGNOSIS — I10 Essential (primary) hypertension: Secondary | ICD-10-CM

## 2020-01-22 DIAGNOSIS — I4821 Permanent atrial fibrillation: Secondary | ICD-10-CM

## 2020-01-22 LAB — CUP PACEART INCLINIC DEVICE CHECK
Battery Remaining Longevity: 116 mo
Battery Voltage: 3.01 V
Brady Statistic RV Percent Paced: 99 %
Date Time Interrogation Session: 20210507122608
Implantable Lead Implant Date: 20100112
Implantable Lead Implant Date: 20100112
Implantable Lead Location: 753859
Implantable Lead Location: 753860
Implantable Pulse Generator Implant Date: 20200204
Lead Channel Impedance Value: 362.5 Ohm
Lead Channel Pacing Threshold Amplitude: 0.5 V
Lead Channel Pacing Threshold Amplitude: 0.5 V
Lead Channel Pacing Threshold Pulse Width: 0.4 ms
Lead Channel Pacing Threshold Pulse Width: 0.4 ms
Lead Channel Sensing Intrinsic Amplitude: 9.3 mV
Lead Channel Setting Pacing Amplitude: 2.5 V
Lead Channel Setting Pacing Pulse Width: 0.4 ms
Lead Channel Setting Sensing Sensitivity: 2 mV
Pulse Gen Model: 1272
Pulse Gen Serial Number: 9077470

## 2020-01-22 NOTE — Patient Instructions (Signed)
Medication Instructions:  none *If you need a refill on your cardiac medications before your next appointment, please call your pharmacy*   Lab Work: none If you have labs (blood work) drawn today and your tests are completely normal, you will receive your results only by: [name] MyChart Message (if you have MyChart) OR . A paper copy in the mail If you have any lab test that is abnormal or we need to change your treatment, we will call you to review the results.   Testing/Procedures: none   Follow-Up: At [facility], you and your health needs are our priority.  As part of our continuing mission to provide you with exceptional heart care, we have created designated Provider Care Teams.  These Care Teams include your primary Cardiologist (physician) and Advanced Practice Providers (APPs -  Physician Assistants and Nurse Practitioners) who all work together to provide you with the care you need, when you need it.  We recommend signing up for the patient portal called "MyChart".  Sign up information is provided on this After Visit Summary.  MyChart is used to connect with patients for Virtual Visits (Telemedicine).  Patients are able to view lab/test results, encounter notes, upcoming appointments, etc.  Non-urgent messages can be sent to your provider as well.   To learn more about what you can do with MyChart, go to [email or web address].    Your next appointment:   1 year(s)  The format for your next appointment:   In Person  Provider:   Oda Kilts, PA   Other Instructions Remote monitoring is used to monitor your Pacemaker from home. This monitoring reduces the number of office visits required to check your device to one time per year. It allows [place] to keep an eye on the functioning of your device to ensure it is working properly. You are scheduled for a device check from home on 03/07/20. You may send your transmission at any time that day. If you have a wireless device, the  transmission will be sent automatically. After your physician reviews your transmission, you will receive a postcard with your next transmission date. [name]

## 2020-01-22 NOTE — Progress Notes (Signed)
Electrophysiology Office Note Date: 01/22/2020  ID:  Ryan, [name] 1931/04/10, MRN [id number]  PCP: [name], MD Primary Cardiologist: Thompson Grayer, MD Electrophysiologist: Thompson Grayer, MD   CC: Pacemaker follow-up  [name] is a 86 y.o. male seen today for Thompson Grayer, MD for routine electrophysiology followup.  Since last being seen in our clinic the patient reports doing very well.  he denies chest pain, palpitations, dyspnea, PND, orthopnea, nausea, vomiting, dizziness, syncope, edema, weight gain, or early satiety.  Device History: St. Jude Dual Chamber PPM implanted 2010 for symptomatic bradycardia  Past Medical History:  Diagnosis Date  . CAD (coronary artery disease) 09/2008  . Cancer ([place])    basal cell carcinoma  . CHF (congestive heart failure) ([place])   . Complication of anesthesia    06/12/11 anesthesia made legs asleep also and went into chf and ended up in ICU after gallbladder surgery  . Diastolic dysfunction    hx of chf due to fluid overload 9/12   . Dysrhythmia    atrial fibrillation  . GERD (gastroesophageal reflux disease)   . History of blood product transfusion    d/t bleeding ulcer  . Hypertension   . Macular degeneration (senile) of retina   . Persistent atrial fibrillation ([place])   . PONV (postoperative nausea and vomiting)   . Presence of permanent cardiac pacemaker 2010  . Shortness of breath dyspnea    exertion  . Sick sinus syndrome (HCC)    St. Jude  . Syncope    resolved s/p PPM   Past Surgical History:  Procedure Laterality Date  . CHOLECYSTECTOMY  06/12/11  . INGUINAL HERNIA REPAIR Left 10/06/2014   Procedure: OPEN REPAIR LEFT INGUINAL HERNIA ;  Surgeon: Fanny Skates, MD;  Location: [facility];  Service: General;  Laterality: Left;  . INSERTION OF MESH Left 10/06/2014   Procedure: INSERTION OF MESH;  Surgeon: Fanny Skates, MD;  Location: [facility];  Service: General;  Laterality: Left;  . IR KYPHO LUMBAR INC FX REDUCE BONE BX  UNI/BIL CANNULATION INC/IMAGING  12/26/2017  . IR RADIOLOGIST EVAL & MGMT  12/12/2017  . LAPAROSCOPIC CHOLECYSTECTOMY W/ CHOLANGIOGRAPHY  06/12/2011  . PACEMAKER INSERTION  09/28/08   SJM Zephyr XL DR  . perforated ulcer    . PPM GENERATOR CHANGEOUT N/A 10/21/2018   Procedure: PPM GENERATOR CHANGEOUT;  Surgeon: Thompson Grayer, MD;  Location: [facility];  Service: Cardiovascular;  Laterality: N/A;  . right inguinal hernia repair  11/19/2002   with mesh  . TRANSURETHRAL RESECTION OF BLADDER TUMOR  09/06/2011   Procedure: TRANSURETHRAL RESECTION OF BLADDER TUMOR (TURBT);  Surgeon: Ailene Rud, MD;  [facility];  Service: Urology;  Laterality: N/A;  . TRANSURETHRAL RESECTION OF PROSTATE  [id number]  . umblical hernia  [id number]    Current Outpatient Medications  Medication Sig Dispense Refill  . acetaminophen (TYLENOL) 325 MG tablet Take 650 mg by mouth every 6 (six) hours as needed.    . carbidopa-levodopa (SINEMET) 25-100 MG tablet Take 1 tablet by mouth 4 (four) times daily. 120 tablet 11  . Cholecalciferol (VITAMIN D3) 50 MCG (2000 UT) TABS Take 2,000 Units by mouth daily.    [name] ELDERBERRY PO Take 500 mg by mouth daily.    [name] ELIQUIS 5 MG TABS tablet TAKE 1 TABLET TWICE A DAY 180 tablet 3  . famotidine (PEPCID) 20 MG tablet Take 20 mg by mouth daily.    . Magnesium 250 MG TABS Take  250 mg by mouth every other day.     . memantine (NAMENDA) 5 MG tablet Take 5 mg by mouth 2 (two) times daily.    [name] triamcinolone cream (KENALOG) 0.1 % Apply 1 application topically 3 (three) times daily.     . vitamin B-12 (CYANOCOBALAMIN) 1000 MCG tablet Take 1,000 mcg by mouth daily.     No current facility-administered medications for this visit.    Allergies:   Hydrocodone and Hydroxyzine   Social History: Social History   Socioeconomic History  . Marital status: Married    Spouse name: Not on file  . Number of children: 0  . Years of education: 31  . Highest education level: Not  on file  Occupational History  . Not on file  Tobacco Use  . Smoking status: Former Smoker    Quit date: 09/17/1958    Years since quitting: 61.3  . Smokeless tobacco: Never Used  . Tobacco comment: quit 1968  Substance and Sexual Activity  . Alcohol use: No  . Drug use: No  . Sexual activity: Not on file  Other Topics Concern  . Not on file  Social History Narrative   Lives with wife   Caffeine use: decaf only mostly   Tea very rarely   Right handed    Social Determinants of Health   Financial Resource Strain:   . Difficulty of Paying Living Expenses:   Food Insecurity:   . Worried About Charity fundraiser in the Last Year:   . Arboriculturist in the Last Year:   Transportation Needs:   . Film/video editor (Medical):   [name] Lack of Transportation (Non-Medical):   Physical Activity:   . Days of Exercise per Week:   . Minutes of Exercise per Session:   Stress:   . Feeling of Stress :   Social Connections:   . Frequency of Communication with Friends and Family:   . Frequency of Social Gatherings with Friends and Family:   . Attends Religious Services:   . Active Member of Clubs or Organizations:   . Attends Archivist Meetings:   [name] Marital Status:   Intimate Partner Violence:   . Fear of Current or Ex-Partner:   . Emotionally Abused:   [name] Physically Abused:   . Sexually Abused:     Family History: Family History  Problem Relation Age of Onset  . Heart attack Mother 47       MI  . Heart failure Father   . Heart attack Brother        Late 22s  . Cancer Sister      Review of Systems: All other systems reviewed and are otherwise negative except as noted above.  Physical Exam: Vitals:   01/22/20 1213  BP: 118/68  Pulse: 60  SpO2: 100%  Weight: 170 lb (77.1 kg)  Height: 5\' 11"  (1.803 m)     GEN- The patient is well appearing, alert and oriented x 3 today.   HEENT: normocephalic, atraumatic; sclera clear, conjunctiva pink; hearing intact;  oropharynx clear; neck supple  Lungs- Clear to ausculation bilaterally, normal work of breathing.  No wheezes, rales, rhonchi Heart- Regular rate and rhythm, no murmurs, rubs or gallops  GI- soft, non-tender, non-distended, bowel sounds present  Extremities- no clubbing, cyanosis, or edema  MS- no significant deformity or atrophy Skin- warm and dry, no rash or lesion; PPM pocket well healed Psych- euthymic mood, full affect Neuro- strength and sensation are  intact  PPM Interrogation- reviewed in detail today,  See PACEART report  EKG:  EKG is ordered today. The ekg ordered today shows atrial fibrillation with controled ventricular rate/V pacing at 60  Recent Labs: 06/10/2019: B Natriuretic Peptide 179.8; Magnesium 2.2 06/11/2019: TSH 1.759 06/12/2019: ALT 6 06/13/2019: BUN 19; Creatinine, Ser 0.85; Hemoglobin 13.7; Platelets 204; Potassium 3.7; Sodium 140   Wt Readings from Last 3 Encounters:  01/22/20 170 lb (77.1 kg)  07/15/19 167 lb 8 oz (76 kg)  06/12/19 165 lb 1.6 oz (74.9 kg)     Other studies Reviewed: Additional studies/ records that were reviewed today include: Previous EP office notes, Previous remote checks, Most recent labwork.   Assessment and Plan:  1. Symptomatic bradycardia s/p St. Jude PPM  Normal PPM function See Pace Art report No changes today  2. Permanent atrial fibrillation Continue eliquis for CHA2DS2VASC of at least 4   Rates controlled  3. HTN Stable. No change today.    Current medicines are reviewed at length with the patient today.   The patient does not have concerns regarding his medicines.  The following changes were made today:  none  Labs/ tests ordered today include:  Orders Placed This Encounter  Procedures  . CUP PACEART [place]  . EKG 12-Lead   Disposition:   Follow up with EP APP in 12 Months. Sooner with issues.   Ryan Lefevre, PA-C  01/22/2020 12:34 PM  [place] [address] [phone] (office) [phone] (fax)

## 2020-01-23 ENCOUNTER — Other Ambulatory Visit: Payer: Self-pay | Admitting: Internal Medicine

## 2020-01-25 NOTE — Telephone Encounter (Signed)
Prescription refill request for Eliquis received.  Last office visit: [place], 01/22/2020 Scr: 0.85, 06/13/2019 Age: 86 y.o. Weight: 77.1 kg   Prescription refill sent.

## 2020-03-07 ENCOUNTER — Ambulatory Visit (INDEPENDENT_AMBULATORY_CARE_PROVIDER_SITE_OTHER): Payer: Medicare Other | Admitting: *Deleted

## 2020-03-07 DIAGNOSIS — I4821 Permanent atrial fibrillation: Secondary | ICD-10-CM

## 2020-03-07 LAB — CUP PACEART REMOTE DEVICE CHECK
Battery Remaining Longevity: 122 mo
Battery Remaining Percentage: 95.5 %
Battery Voltage: 3.01 V
Brady Statistic RV Percent Paced: 99 %
Date Time Interrogation Session: 20210621020012
Implantable Lead Implant Date: 20100112
Implantable Lead Implant Date: 20100112
Implantable Lead Location: 753859
Implantable Lead Location: 753860
Implantable Pulse Generator Implant Date: 20200204
Lead Channel Impedance Value: 390 Ohm
Lead Channel Pacing Threshold Amplitude: 0.5 V
Lead Channel Pacing Threshold Pulse Width: 0.4 ms
Lead Channel Sensing Intrinsic Amplitude: 7.6 mV
Lead Channel Setting Pacing Amplitude: 2.5 V
Lead Channel Setting Pacing Pulse Width: 0.4 ms
Lead Channel Setting Sensing Sensitivity: 2 mV
Pulse Gen Model: 1272
Pulse Gen Serial Number: 9077470

## 2020-03-08 NOTE — Progress Notes (Signed)
Remote pacemaker transmission.   

## 2020-06-06 ENCOUNTER — Ambulatory Visit (INDEPENDENT_AMBULATORY_CARE_PROVIDER_SITE_OTHER): Payer: Medicare Other | Admitting: *Deleted

## 2020-06-06 DIAGNOSIS — R001 Bradycardia, unspecified: Secondary | ICD-10-CM | POA: Diagnosis not present

## 2020-06-06 LAB — CUP PACEART REMOTE DEVICE CHECK
Battery Remaining Longevity: 121 mo
Battery Remaining Percentage: 95.5 %
Battery Voltage: 3.01 V
Brady Statistic RV Percent Paced: 99 %
Date Time Interrogation Session: 20210920020012
Implantable Lead Implant Date: 20100112
Implantable Lead Implant Date: 20100112
Implantable Lead Location: 753859
Implantable Lead Location: 753860
Implantable Pulse Generator Implant Date: 20200204
Lead Channel Impedance Value: 360 Ohm
Lead Channel Pacing Threshold Amplitude: 0.5 V
Lead Channel Pacing Threshold Pulse Width: 0.4 ms
Lead Channel Sensing Intrinsic Amplitude: 9.3 mV
Lead Channel Setting Pacing Amplitude: 2.5 V
Lead Channel Setting Pacing Pulse Width: 0.4 ms
Lead Channel Setting Sensing Sensitivity: 2 mV
Pulse Gen Model: 1272
Pulse Gen Serial Number: 9077470

## 2020-06-07 NOTE — Progress Notes (Signed)
Remote pacemaker transmission.   

## 2020-07-14 ENCOUNTER — Encounter: Payer: Self-pay | Admitting: Neurology

## 2020-07-14 ENCOUNTER — Ambulatory Visit (INDEPENDENT_AMBULATORY_CARE_PROVIDER_SITE_OTHER): Payer: Medicare Other | Admitting: Neurology

## 2020-07-14 VITALS — BP 145/84 | HR 60 | Ht 71.0 in | Wt 170.0 lb

## 2020-07-14 DIAGNOSIS — R269 Unspecified abnormalities of gait and mobility: Secondary | ICD-10-CM | POA: Diagnosis not present

## 2020-07-14 DIAGNOSIS — I4821 Permanent atrial fibrillation: Secondary | ICD-10-CM

## 2020-07-14 DIAGNOSIS — G20 Parkinson's disease: Secondary | ICD-10-CM | POA: Diagnosis not present

## 2020-07-14 DIAGNOSIS — R29898 Other symptoms and signs involving the musculoskeletal system: Secondary | ICD-10-CM

## 2020-07-14 DIAGNOSIS — F09 Unspecified mental disorder due to known physiological condition: Secondary | ICD-10-CM

## 2020-07-14 DIAGNOSIS — G20A1 Parkinson's disease without dyskinesia, without mention of fluctuations: Secondary | ICD-10-CM

## 2020-07-14 MED ORDER — CARBIDOPA-LEVODOPA 25-100 MG PO TABS: 1.0000 | ORAL_TABLET | Freq: Four times a day (QID) | ORAL | 11 refills | Status: AC

## 2020-07-14 NOTE — Progress Notes (Signed)
[facility]  PATIENT: [name] DOB: 01/28/1931  REFERRING DOCTOR OR PCP:  Ryan Kraft, MD SOURCE: patient, notes from Dr. Wilson [name], lab results  _________________________________   HISTORICAL  CHIEF COMPLAINT:  Chief Complaint  Patient presents with  . Follow-up    RM 13 with wife. Last seen 07/15/2019. Doing about the same as last visit. Denies any new sx.  . Parkinson's Disease    Takes sinemet  . Gait Problem    Ambulates with walker.     HISTORY OF PRESENT ILLNESS:   Update 07/15/2019: He is a 86 y.o. man with Afib, CAD, pacemaker for SSS, Parkinson's, gait disturbance  He continues to have difficulty with gait and is using a walker.   He has not had any more episodes of severe leg weakness like he had in September 2020.  Gait will fluctuate some but he has no falls.    He has been on Sinemet since 2018 at a dose of 25/100 qid.   He did note some improvement when he started but no to baseline .   His wife feels gait is the same this year as last year.    His tremor is very mild.     Memory is doing about the same.   He continues to have some cognitive changes.    He notes mild neck pain.   He denies any new bladder changes and has stable once a day nocturia.      I reviewed data from his recent hospitalization including the discharge note, laboratory studies, imaging reports. CT scan of the head.  It shows advanced chronic microvascular ischemic changes and moderate atrophy.  There were no acute findings.  He has Afib and bradycardia and a pacemaker has been placed.      I reviewed the CT scan from 06/12/2019.  He has atrophy and chronic microvascular ischemic changes, essentially unchanged compared to the 2018 CT scan  CT of the lumbar spine 10/29/2017 showed an L1 compression fracture likely chronic and degenerative changes maximal at L5-S1 with a disc osteophyte complexes causing foraminal narrowing, left greater than right   REVIEW OF  SYSTEMS: Constitutional: No fevers, chills, sweats, or change in appetite.   Some fatigue Eyes: No visual changes, double vision, eye pain Ear, nose and throat: No hearing loss, ear pain, nasal congestion, sore throat Cardiovascular: has pacemaker for Afib/bradycardia.  No chest pain, palpitations Respiratory: No shortness of breath at rest or with exertion.   No wheezes GastrointestinaI: No nausea, vomiting, diarrhea, abdominal pain, fecal incontinence Genitourinary: No dysuria, urinary retention or frequency.  No nocturia. Musculoskeletal: No neck pain, back pain Integumentary: No rash, pruritus, skin lesions Neurological: as above Psychiatric: No depression at this time.  No anxiety Endocrine: No palpitations, diaphoresis, change in appetite, change in weigh or increased thirst Hematologic/Lymphatic: No anemia, purpura, petechiae. Allergic/Immunologic: No itchy/runny eyes, nasal congestion, recent allergic reactions, rashes  ALLERGIES: Allergies  Allergen Reactions  . Hydrocodone     "Made him feel bad"  . Hydroxyzine Anxiety    HOME MEDICATIONS:  Current Outpatient Medications:  .  acetaminophen (TYLENOL) 325 MG tablet, Take 650 mg by mouth every 6 (six) hours as needed., Disp: , Rfl:  .  carbidopa-levodopa (SINEMET) 25-100 MG tablet, Take 1 tablet by mouth 4 (four) times daily., Disp: 120 tablet, Rfl: 11 .  Cholecalciferol (VITAMIN D3) 50 MCG (2000 UT) TABS, Take 2,000 Units by mouth daily., Disp: , Rfl:  .  ELDERBERRY PO, Take 500  mg by mouth daily., Disp: , Rfl:  .  ELIQUIS 5 MG TABS tablet, TAKE 1 TABLET BY MOUTH TWICE A DAY, Disp: 60 tablet, Rfl: 5 .  famotidine (PEPCID) 20 MG tablet, Take 20 mg by mouth daily., Disp: , Rfl:  .  Magnesium 250 MG TABS, Take 250 mg by mouth every other day. , Disp: , Rfl:  .  memantine (NAMENDA) 5 MG tablet, Take 5 mg by mouth 2 (two) times daily., Disp: , Rfl:  .  triamcinolone cream (KENALOG) 0.1 %, Apply 1 application topically 3  (three) times daily. , Disp: , Rfl:  .  vitamin B-12 (CYANOCOBALAMIN) 1000 MCG tablet, Take 1,000 mcg by mouth daily., Disp: , Rfl:  .  Zinc 50 MG TABS, Take 50 mg by mouth daily., Disp: , Rfl:   PAST MEDICAL HISTORY: Past Medical History:  Diagnosis Date  . CAD (coronary artery disease) 09/2008  . Cancer ([place])    basal cell carcinoma  . CHF (congestive heart failure) ([place])   . Complication of anesthesia    06/12/11 anesthesia made legs asleep also and went into chf and ended up in ICU after gallbladder surgery  . Diastolic dysfunction    hx of chf due to fluid overload 9/12   . Dysrhythmia    atrial fibrillation  . GERD (gastroesophageal reflux disease)   . History of blood product transfusion    d/t bleeding ulcer  . Hypertension   . Macular degeneration (senile) of retina   . Persistent atrial fibrillation ([place])   . PONV (postoperative nausea and vomiting)   . Presence of permanent cardiac pacemaker 2010  . Shortness of breath dyspnea    exertion  . Sick sinus syndrome (H[facility])    St. Jude  . Syncope    resolved s/p PPM    PAST SURGICAL HISTORY: Past Surgical History:  Procedure Laterality Date  . CHOLECYSTECTOMY  06/12/11  . INGUINAL HERNIA REPAIR Left 10/06/2014   Procedure: OPEN REPAIR LEFT INGUINAL HERNIA ;  Surgeon: Fanny Skates, MD;  Location: [facility];  Service: General;  Laterality: Left;  . INSERTION OF MESH Left 10/06/2014   Procedure: INSERTION OF MESH;  Surgeon: Fanny Skates, MD;  Location: [facility];  Service: General;  Laterality: Left;  . IR KYPHO LUMBAR INC FX REDUCE BONE BX UNI/BIL CANNULATION INC/IMAGING  12/26/2017  . IR RADIOLOGIST EVAL & MGMT  12/12/2017  . LAPAROSCOPIC CHOLECYSTECTOMY W/ CHOLANGIOGRAPHY  06/12/2011  . PACEMAKER INSERTION  09/28/08   SJM Zephyr XL DR  . perforated ulcer    . PPM GENERATOR CHANGEOUT N/A 10/21/2018   Procedure: PPM GENERATOR CHANGEOUT;  Surgeon: Thompson Grayer, MD;  Location: [facility];  Service: Cardiovascular;   Laterality: N/A;  . right inguinal hernia repair  11/19/2002   with mesh  . TRANSURETHRAL RESECTION OF BLADDER TUMOR  09/06/2011   Procedure: TRANSURETHRAL RESECTION OF BLADDER TUMOR (TURBT);  Surgeon: Ailene Rud, MD;  [facility];  Service: Urology;  Laterality: N/A;  . TRANSURETHRAL RESECTION OF PROSTATE  40/34/7425  . umblical hernia  9/56/3875    FAMILY HISTORY: Family History  Problem Relation Age of Onset  . Heart attack Mother 31       MI  . Heart failure Father   . Heart attack Brother        Late 60s  . Cancer Sister     SOCIAL HISTORY:  Social History   Socioeconomic History  . Marital status: Married    Spouse name:  Not on file  . Number of children: 0  . Years of education: 86  . Highest education level: Not on file  Occupational History  . Not on file  Tobacco Use  . Smoking status: Former Smoker    Quit date: 09/17/1958    Years since quitting: 61.8  . Smokeless tobacco: Never Used  . Tobacco comment: quit 1968  Substance and Sexual Activity  . Alcohol use: No  . Drug use: No  . Sexual activity: Not on file  Other Topics Concern  . Not on file  Social History Narrative   Lives with wife   Caffeine use: decaf only mostly   Tea very rarely   Right handed    Social Determinants of Health   Financial Resource Strain:   . Difficulty of Paying Living Expenses: Not on file  Food Insecurity:   . Worried About Charity fundraiser in the Last Year: Not on file  . Ran Out of Food in the Last Year: Not on file  Transportation Needs:   . Lack of Transportation (Medical): Not on file  . Lack of Transportation (Non-Medical): Not on file  Physical Activity:   . Days of Exercise per Week: Not on file  . Minutes of Exercise per Session: Not on file  Stress:   . Feeling of Stress : Not on file  Social Connections:   . Frequency of Communication with Friends and Family: Not on file  . Frequency of Social Gatherings with Friends and Family: Not  on file  . Attends Religious Services: Not on file  . Active Member of Clubs or Organizations: Not on file  . Attends Archivist Meetings: Not on file  . Marital Status: Not on file  Intimate Partner Violence:   . Fear of Current or Ex-Partner: Not on file  . Emotionally Abused: Not on file  . Physically Abused: Not on file  . Sexually Abused: Not on file     PHYSICAL EXAM  Vitals:   07/14/20 1105  BP: (!) 145/84  Pulse: 60  Weight: 170 lb (77.1 kg)  Height: 5\' 11"  (1.803 m)    Body mass index is 23.71 kg/m.   General: The patient is well-developed and well-nourished and in no acute distress.  He appears bradykinetic.   Neurologic Exam  Mental status: The patient is alert and oriented to name and place but not time.  He has reduced focus/attention  Speech is normal.   STM is 1/3 without prompt and 3/3 with category prompt.    Cranial nerves: Extraocular movements are reduced with upgaze and normal with other days.  Facial symmetry is present. There is good facial sensation to soft touch bilaterally.Facial strength is normal.  Trapezius and sternocleidomastoid strength is normal. No dysarthria is noted.   Mild hearing deficits are noted.  Motor:  Muscle bulk is normal.   Tone is normal. Strength is  5 / 5 in all 4 extremities.   He needs to use 1 arm to stand up from a chair and is unable to stand up without using arms.     Sensory: Sensory testing is intact to touch and vibration sensation in the arms.  Touch sensation is normal in the legs. Vibration sensation is normal at the ankles and reduced by about half at the toes.  Coordination: Cerebellar testing reveals good finger-nose-finger and heel-to-shin bilaterally.  Gait and station: Station is normal.   Gait has a reduced stride without his walker but stride  is better with walker. He cannot tandem walk. He has retropulsion with posture disturbed. He turns 180 degrees in 3 steps.    Romberg is negative.    Reflexes: Deep tendon reflexes are 2 in arms, 1 at knees and absent at ankles.      DIAGNOSTIC DATA (LABS, IMAGING, TESTING) - I reviewed patient records, labs, notes, testing and imaging myself where available.  Lab Results  Component Value Date   WBC 7.6 06/13/2019   HGB 13.7 06/13/2019   HCT 43.6 06/13/2019   MCV 93.8 06/13/2019   PLT 204 06/13/2019      Component Value Date/Time   NA 140 06/13/2019 0550   NA 143 10/17/2018 1334   K 3.7 06/13/2019 0550   CL 108 06/13/2019 0550   CO2 22 06/13/2019 0550   GLUCOSE 104 (H) 06/13/2019 0550   BUN 19 06/13/2019 0550   BUN 17 10/17/2018 1334   CREATININE 0.85 06/13/2019 0550   CALCIUM 8.5 (L) 06/13/2019 0550   PROT 6.7 06/12/2019 0512   ALBUMIN 3.1 (L) 06/12/2019 0512   AST 16 06/12/2019 0512   ALT 6 06/12/2019 0512   ALKPHOS 77 06/12/2019 0512   BILITOT 1.1 06/12/2019 0512   GFRNONAA >60 06/13/2019 0550   GFRAA >60 06/13/2019 0550   Lab Results  Component Value Date   CHOL  09/27/2008    183        ATP III CLASSIFICATION:  <200     mg/dL   Desirable  200-239  mg/dL   Borderline High  >=240    mg/dL   High          HDL 65 09/27/2008   LDLCALC (H) 09/27/2008    103        Total Cholesterol/HDL:CHD Risk Coronary Heart Disease Risk Table                     Men   Women  1/2 Average Risk   3.4   3.3  Average Risk       5.0   4.4  2 X Average Risk   9.6   7.1  3 X Average Risk  23.4   11.0        Use the calculated Patient Ratio above and the CHD Risk Table to determine the patient's CHD Risk.        ATP III CLASSIFICATION (LDL):  <100     mg/dL   Optimal  100-129  mg/dL   Near or Above                    Optimal  130-159  mg/dL   Borderline  160-189  mg/dL   High  >190     mg/dL   Very High   TRIG 74 09/27/2008   CHOLHDL 2.8 09/27/2008   No results found for: HGBA1C Lab Results  Component Value Date   UJWJXBJY78 295 06/11/2019        ASSESSMENT AND PLAN  Parkinson's disease ([place])  Gait  disturbance  Weakness of both lower extremities  Permanent atrial fibrillation  Cognitive dysfunction   1.    He has a multifactorial gait disorder-- Parkinson's, microvascular ischemic changes in brain, arthritis.   He is doing reasonably well with the walker and is advised to continue to use it, inside the house as well as outside.He also has mild cognitive issues more likely due to vascular dementia than Alzheimer's (also possible related to PD).   2.  Continue the Sinemet to 25/100 qid as that may help the gait some. 3.   Stay active and try to exercise to build up the leg muscles more.  4.   RTC  1 year or sooner if new or worsening neurologic symptoms.   Rital Cavey A. Felecia Shelling, MD, PhD 83/33/8329, 1:91 PM Certified in Neurology, Clinical Neurophysiology, Sleep Medicine, Pain Medicine and Neuroimaging  [facility] [address] [place], [place] [address] [phone]

## 2020-09-06 ENCOUNTER — Ambulatory Visit (INDEPENDENT_AMBULATORY_CARE_PROVIDER_SITE_OTHER): Payer: Medicare Other

## 2020-09-06 DIAGNOSIS — R001 Bradycardia, unspecified: Secondary | ICD-10-CM

## 2020-09-06 LAB — CUP PACEART REMOTE DEVICE CHECK
Battery Remaining Longevity: 119 mo
Battery Remaining Percentage: 95.5 %
Battery Voltage: 3.01 V
Brady Statistic RV Percent Paced: 99 %
Date Time Interrogation Session: 20211221020013
Implantable Lead Implant Date: 20100112
Implantable Lead Implant Date: 20100112
Implantable Lead Location: 753859
Implantable Lead Location: 753860
Implantable Pulse Generator Implant Date: 20200204
Lead Channel Impedance Value: 340 Ohm
Lead Channel Pacing Threshold Amplitude: 0.5 V
Lead Channel Pacing Threshold Pulse Width: 0.4 ms
Lead Channel Sensing Intrinsic Amplitude: 3.9 mV
Lead Channel Setting Pacing Amplitude: 2.5 V
Lead Channel Setting Pacing Pulse Width: 0.4 ms
Lead Channel Setting Sensing Sensitivity: 2 mV
Pulse Gen Model: 1272
Pulse Gen Serial Number: 9077470

## 2020-09-16 ENCOUNTER — Emergency Department (HOSPITAL_COMMUNITY)
Admission: EM | Admit: 2020-09-16 | Discharge: 2020-09-17 | Disposition: A | Discharge status: Home / Self Care | Payer: Medicare Other | Attending: Emergency Medicine | Admitting: Emergency Medicine

## 2020-09-16 ENCOUNTER — Other Ambulatory Visit: Payer: Self-pay

## 2020-09-16 ENCOUNTER — Encounter (HOSPITAL_COMMUNITY): Payer: Self-pay | Admitting: Emergency Medicine

## 2020-09-16 ENCOUNTER — Emergency Department (HOSPITAL_COMMUNITY): Payer: Medicare Other

## 2020-09-16 DIAGNOSIS — I509 Heart failure, unspecified: Secondary | ICD-10-CM | POA: Insufficient documentation

## 2020-09-16 DIAGNOSIS — R519 Headache, unspecified: Secondary | ICD-10-CM | POA: Insufficient documentation

## 2020-09-16 DIAGNOSIS — I2510 Atherosclerotic heart disease of native coronary artery without angina pectoris: Secondary | ICD-10-CM | POA: Insufficient documentation

## 2020-09-16 DIAGNOSIS — Z7901 Long term (current) use of anticoagulants: Secondary | ICD-10-CM | POA: Insufficient documentation

## 2020-09-16 DIAGNOSIS — G20 Parkinson's disease: Secondary | ICD-10-CM | POA: Insufficient documentation

## 2020-09-16 DIAGNOSIS — Z950 Presence of cardiac pacemaker: Secondary | ICD-10-CM | POA: Insufficient documentation

## 2020-09-16 DIAGNOSIS — H538 Other visual disturbances: Secondary | ICD-10-CM | POA: Diagnosis not present

## 2020-09-16 DIAGNOSIS — Z87891 Personal history of nicotine dependence: Secondary | ICD-10-CM | POA: Insufficient documentation

## 2020-09-16 DIAGNOSIS — Z79899 Other long term (current) drug therapy: Secondary | ICD-10-CM | POA: Diagnosis not present

## 2020-09-16 DIAGNOSIS — I110 Hypertensive heart disease with heart failure: Secondary | ICD-10-CM | POA: Insufficient documentation

## 2020-09-16 DIAGNOSIS — Z85828 Personal history of other malignant neoplasm of skin: Secondary | ICD-10-CM | POA: Diagnosis not present

## 2020-09-16 LAB — BASIC METABOLIC PANEL
Anion gap: 9 (ref 5–15)
BUN: 16 mg/dL (ref 8–23)
CO2: 25 mmol/L (ref 22–32)
Calcium: 8.7 mg/dL — ABNORMAL LOW (ref 8.9–10.3)
Chloride: 105 mmol/L (ref 98–111)
Creatinine, Ser: 1.01 mg/dL (ref 0.61–1.24)
GFR, Estimated: >60 mL/min (ref >60)
Glucose, Bld: 107 mg/dL — ABNORMAL HIGH (ref 70–99)
Potassium: 4.3 mmol/L (ref 3.5–5.1)
Sodium: 139 mmol/L (ref 135–145)

## 2020-09-16 LAB — CBC WITH DIFFERENTIAL/PLATELET
Abs Immature Granulocytes: 0.08 10*3/uL — ABNORMAL HIGH (ref 0.00–0.07)
Basophils Absolute: 0 10*3/uL (ref 0.0–0.1)
Basophils Relative: 0 %
Eosinophils Absolute: 0.3 10*3/uL (ref 0.0–0.5)
Eosinophils Relative: 4 %
HCT: 47.4 % (ref 39.0–52.0)
Hemoglobin: 15.8 g/dL (ref 13.0–17.0)
Immature Granulocytes: 1 %
Lymphocytes Relative: 23 %
Lymphs Abs: 1.9 10*3/uL (ref 0.7–4.0)
MCH: 31.1 pg (ref 26.0–34.0)
MCHC: 33.3 g/dL (ref 30.0–36.0)
MCV: 93.3 fL (ref 80.0–100.0)
Monocytes Absolute: 0.8 10*3/uL (ref 0.1–1.0)
Monocytes Relative: 9 %
Neutro Abs: 5.1 10*3/uL (ref 1.7–7.7)
Neutrophils Relative %: 63 %
Platelets: 222 10*3/uL (ref 150–400)
RBC: 5.08 MIL/uL (ref 4.22–5.81)
RDW: 13.6 % (ref 11.5–15.5)
WBC: 8.2 10*3/uL (ref 4.0–10.5)
nRBC: 0 % (ref 0.0–0.2)

## 2020-09-16 MED ORDER — ACETAMINOPHEN 325 MG PO TABS
650.0000 mg | ORAL_TABLET | Freq: Once | ORAL | Status: AC
  Administered 2020-09-16: 325 mg via ORAL

## 2020-09-16 NOTE — ED Triage Notes (Signed)
Patient coming from home. Complaint of headache since last night that got worse this morning. Patient A&Ox4. NAD. VSS.

## 2020-09-17 LAB — C-REACTIVE PROTEIN: CRP: 0.6 mg/dL (ref <1.0)

## 2020-09-17 LAB — SEDIMENTATION RATE: Sed Rate: 12 mm/hr (ref 0–16)

## 2020-09-17 MED ORDER — TETRACAINE HCL 0.5 % OP SOLN
2.0000 [drp] | Freq: Once | OPHTHALMIC | Status: AC
Start: 2020-09-17 — End: 2020-09-17
  Administered 2020-09-17: 2 [drp] via OPHTHALMIC
  Filled 2020-09-17: qty 4

## 2020-09-17 NOTE — ED Notes (Signed)
Pt has macular degeneration and was unable to see the snellen chart in either eye. PA made aware.

## 2020-09-17 NOTE — Discharge Instructions (Signed)
Your work-up in the emergency department today was reassuring.  Your labs were normal and the CT scan of your head did not show any acute findings in your brain.  We do recommend a follow-up with your neurologist in regards to your symptoms.  If you have any new or worsening symptoms in the meantime please return the emergency department.

## 2020-09-17 NOTE — ED Provider Notes (Signed)
[facility] EMERGENCY DEPARTMENT Provider Note   CSN: [id number] Arrival date & time: 09/16/20  1334     History Chief Complaint  Patient presents with  . Headache    [name] is a 87 y.o. male.  HPI   87 year old male presenting the emergency department today for evaluation of CAD, CHF, diastolic dysfunction, GERD, sinus sick syndrome, who presents the emergency department today for evaluation of a headache.  Wife is at bedside and assist with the history.  She states earlier today the patient was walking to the bathroom and had a funny feeling in his head.  He is complaining of pain to the left side of his head that he feels is secondary to some skin lesions have been present for a long time.  At this time the pain is completely improved.  He also notes he has a history of macular degeneration generation and has chronic vision problems in the bilateral eyes which is worse on the left.  States that he had an episode where his vision went dark but is now normal.  He denies that his vision is worse today than prior.  He denies any neck pain, recent trauma.  Denies any unilateral numbness, weakness, speech problems.  Past Medical History:  Diagnosis Date  . CAD (coronary artery disease) 09/2008  . Cancer ([place])    basal cell carcinoma  . CHF (congestive heart failure) ([place])   . Complication of anesthesia    06/12/11 anesthesia made legs asleep also and went into chf and ended up in ICU after gallbladder surgery  . Diastolic dysfunction    hx of chf due to fluid overload 9/12   . Dysrhythmia    atrial fibrillation  . GERD (gastroesophageal reflux disease)   . History of blood product transfusion    d/t bleeding ulcer  . Hypertension   . Macular degeneration (senile) of retina   . Persistent atrial fibrillation ([place])   . PONV (postoperative nausea and vomiting)   . Presence of permanent cardiac pacemaker 2010  . Shortness of breath dyspnea    exertion  .  Sick sinus syndrome ([facility])    St. Jude  . Syncope    resolved s/p PPM    Patient Active Problem List   Diagnosis Date Noted  . Cognitive dysfunction 07/14/2020  . Lower extremity numbness 06/12/2019  . Weakness of both lower extremities 06/11/2019  . Elevated brain natriuretic peptide (BNP) level 06/11/2019  . Hyperglycemia 06/11/2019  . Leucocytosis 06/11/2019  . Parkinson's disease ([place]) 11/30/2018  . Pelvic pain 11/30/2018  . Intracranial aneurysm 11/28/2016  . Gait disturbance 11/06/2016  . Muscle fatigue 11/06/2016  . Post herpetic neuralgia 11/06/2016  . Left inguinal hernia 10/06/2014  . Shortness of breath 01/03/2014  . Pacemaker-St.Jude 07/09/2012  . Permanent atrial fibrillation 10/20/2010  . HYPERLIPIDEMIA-MIXED 10/26/2009  . Macular degeneration 12/28/2008  . CAD, NATIVE VESSEL 12/28/2008  . GERD 12/28/2008  . SYNCOPE 12/28/2008  . Hypertension 12/28/2008  . Sick sinus syndrome ([place]) 12/28/2008    Past Surgical History:  Procedure Laterality Date  . CHOLECYSTECTOMY  06/12/11  . INGUINAL HERNIA REPAIR Left 10/06/2014   Procedure: OPEN REPAIR LEFT INGUINAL HERNIA ;  Surgeon: Fanny Skates, MD;  Location: [facility];  Service: General;  Laterality: Left;  . INSERTION OF MESH Left 10/06/2014   Procedure: INSERTION OF MESH;  Surgeon: Fanny Skates, MD;  Location: [facility];  Service: General;  Laterality: Left;  . IR KYPHO LUMBAR INC FX  REDUCE BONE BX UNI/BIL CANNULATION INC/IMAGING  12/26/2017  . IR RADIOLOGIST EVAL & MGMT  12/12/2017  . LAPAROSCOPIC CHOLECYSTECTOMY W/ CHOLANGIOGRAPHY  06/12/2011  . PACEMAKER INSERTION  09/28/08   SJM [place] XL DR  . perforated ulcer    . PPM GENERATOR CHANGEOUT N/A 10/21/2018   Procedure: PPM GENERATOR CHANGEOUT;  Surgeon: Hillis Range, MD;  Location: [facility];  Service: Cardiovascular;  Laterality: N/A;  . right inguinal hernia repair  11/19/2002   with mesh  . TRANSURETHRAL RESECTION OF BLADDER TUMOR  09/06/2011   Procedure:  TRANSURETHRAL RESECTION OF BLADDER TUMOR (TURBT);  Surgeon: Kathi Ludwig, MD;  Location: [facility];  Service: Urology;  Laterality: N/A;  . TRANSURETHRAL RESECTION OF PROSTATE  09/06/2011  . umblical hernia  10/28/2009       Family History  Problem Relation Age of Onset  . Heart attack Mother 33       MI  . Heart failure Father   . Heart attack Brother        Late 97s  . Cancer Sister     Social History   Tobacco Use  . Smoking status: Former Smoker    Quit date: 09/17/1958    Years since quitting: 62.0  . Smokeless tobacco: Never Used  . Tobacco comment: quit 1968  Substance Use Topics  . Alcohol use: No  . Drug use: No    Home Medications Prior to Admission medications   Medication Sig Start Date End Date Taking? Authorizing Provider  acetaminophen (TYLENOL) 325 MG tablet Take 650 mg by mouth every 6 (six) hours as needed.    [provider]  carbidopa-levodopa (SINEMET) 25-100 MG tablet Take 1 tablet by mouth 4 (four) times daily. 07/14/20   Sater, Pearletha Furl, MD  Cholecalciferol (VITAMIN D3) 50 MCG (2000 UT) TABS Take 2,000 Units by mouth daily.    [provider]  ELDERBERRY PO Take 500 mg by mouth daily.    [provider]  ELIQUIS 5 MG TABS tablet TAKE 1 TABLET BY MOUTH TWICE A DAY 01/25/20   Allred, Fayrene Fearing, MD  famotidine (PEPCID) 20 MG tablet Take 20 mg by mouth daily.    [provider]  Magnesium 250 MG TABS Take 250 mg by mouth every other day.     [provider]  memantine (NAMENDA) 5 MG tablet Take 5 mg by mouth 2 (two) times daily. 05/25/19   [provider]  triamcinolone cream (KENALOG) 0.1 % Apply 1 application topically 3 (three) times daily.  05/25/19   [provider]  vitamin B-12 (CYANOCOBALAMIN) 1000 MCG tablet Take 1,000 mcg by mouth daily.    [provider]  Zinc 50 MG TABS Take 50 mg by mouth daily.    [provider]    Allergies    Hydrocodone and  Hydroxyzine  Review of Systems   Review of Systems  Constitutional: Negative for fever.  HENT: Negative for ear pain and sore throat.   Eyes: Positive for visual disturbance.  Respiratory: Negative for cough and shortness of breath.   Cardiovascular: Negative for chest pain.  Gastrointestinal: Negative for abdominal pain, constipation, diarrhea, nausea and vomiting.  Genitourinary: Negative for dysuria and hematuria.  Musculoskeletal: Negative for back pain.  Skin: Negative for rash.  Neurological: Positive for headaches.  All other systems reviewed and are negative.   Physical Exam Updated Vital Signs BP (!) 147/95   Pulse 64   Temp (!) 97.5 F (36.4 C) (Oral)  Resp 20   Ht 5' 10.5" (1.791 m)   SpO2 99%   BMI 24.05 kg/m   Physical Exam Vitals and nursing note reviewed.  Constitutional:      Appearance: He is well-developed and well-nourished.  HENT:     Head: Normocephalic and atraumatic.     Comments: No TTP over the temporal artery Eyes:     Extraocular Movements: Extraocular movements intact.     Conjunctiva/sclera: Conjunctivae normal.     Pupils: Pupils are equal, round, and reactive to light.     Comments: IOP: right: 16 mmHg, left: 15 mmHg  Cardiovascular:     Rate and Rhythm: Normal rate and regular rhythm.     Heart sounds: Normal heart sounds. No murmur heard.   Pulmonary:     Effort: Pulmonary effort is normal. No respiratory distress.     Breath sounds: Normal breath sounds. No wheezing, rhonchi or rales.  Abdominal:     General: Bowel sounds are normal.     Palpations: Abdomen is soft.     Tenderness: There is no abdominal tenderness.  Musculoskeletal:        General: No edema.     Cervical back: Neck supple.  Skin:    General: Skin is warm and dry.  Neurological:     Mental Status: He is alert.     Comments: Mental Status:  Alert, thought content appropriate, able to give a coherent history. Speech fluent without evidence of aphasia. Able  to follow 2 step commands without difficulty.  Cranial Nerves:  II:  pupils equal, round, reactive to light III,IV, VI: ptosis not present, extra-ocular motions intact bilaterally  V,VII: smile symmetric, facial light touch sensation equal VIII: hearing grossly normal to voice  X: uvula elevates symmetrically  XI: bilateral shoulder shrug symmetric and strong XII: midline tongue extension without fassiculations Motor:  Normal tone. 5/5 strength of BUE and BLE major muscle groups including strong and equal grip strength and dorsiflexion/plantar flexion Sensory: light touch normal in all extremities.   Psychiatric:        Mood and Affect: Mood and affect normal.     ED Results / Procedures / Treatments   Labs (all labs ordered are listed, but only abnormal results are displayed) Labs Reviewed  CBC WITH DIFFERENTIAL/PLATELET - Abnormal; Notable for the following components:      Result Value   Abs Immature Granulocytes 0.08 (*)    All other components within normal limits  BASIC METABOLIC PANEL - Abnormal; Notable for the following components:   Glucose, Bld 107 (*)    Calcium 8.7 (*)    All other components within normal limits  SEDIMENTATION RATE  C-REACTIVE PROTEIN    EKG EKG Interpretation  Date/Time:  Friday September 16 2020 13:39:11 EST Ventricular Rate:  62 PR Interval:    QRS Duration: 154 QT Interval:  488 QTC Calculation: 495 R Axis:   -132 Text Interpretation: Ventricular-paced rhythm Abnormal ECG Confirmed by Pryor Curia [phone]) on 09/17/2020 12:04:30 AM   Radiology CT Head Wo Contrast  Result Date: 09/16/2020 CLINICAL DATA:  Headache, worsening headache in an 87 year old male EXAM: CT HEAD WITHOUT CONTRAST TECHNIQUE: Contiguous axial images were obtained from the base of the skull through the vertex without intravenous contrast. COMPARISON:  June 12, 2019 FINDINGS: Brain: No evidence of acute infarction, hemorrhage, hydrocephalus, extra-axial  collection or mass lesion/mass effect. Vascular: No hyperdense vessel or unexpected calcification. Skull: Normal. Negative for fracture or focal lesion. Sinuses/Orbits: Mucous retention cysts or  polyps in the RIGHT maxillary sinus slightly increased. Mucosal thickening of the LEFT maxillary sinus slightly improved. The mucosal thickening of ethmoid sinuses and opacification also slightly improved with frothy secretions in the LEFT sphenoid sinus. Sinuses are incompletely imaged. Other: None. IMPRESSION: 1. No acute intracranial abnormality. 2. Paranasal sinus disease overall with slight improvement with acute and chronic features though with some mild worsening of disease in the RIGHT maxillary sinus. Electronically Signed   By: Zetta Bills M.D.   On: 09/16/2020 14:14    Procedures Procedures (including critical care time)\  Medications Ordered in ED Medications  acetaminophen (TYLENOL) tablet 650 mg (325 mg Oral Given 09/16/20 2015)  tetracaine (PONTOCAINE) 0.5 % ophthalmic solution 2 drop (2 drops Both Eyes Given by Other 09/17/20 0225)    ED Course  I have reviewed the triage vital signs and the nursing notes.  Pertinent labs & imaging results that were available during my care of the patient were reviewed by me and considered in my medical decision making (see chart for details).    MDM Rules/Calculators/A&P                         87 year old male presenting the emergency department today for evaluation of abnormal feeling in the head with headache.  He has some chronic vision changes due to his macular degeneration.  Reviewed/interpreted labs CBC and BMP are unremarkable.  Sed rate and CRP are negative making temporal arteritis very unlikely.  CT head - 1. No acute intracranial abnormality. 2. Paranasal sinus disease overall with slight improvement with acute and chronic features though with some mild worsening of disease in the RIGHT maxillary sinus  States vision today is overall  unchanged.  Have lower suspicion for acute CVA or other emergent neurologic process at this time.  Work-up reassuring.  Feel he can follow-up with neurology as an outpatient.  Patient seen in conjunction with Dr. Leonides Schanz, supervising physician, who personally evaluated the patient and is in agreement with the plan.   Final Clinical Impression(s) / ED Diagnoses Final diagnoses:  Headache disorder    Rx / DC Orders ED Discharge Orders    None       Rodney Booze, PA-C 09/17/20 0411    Ward, Delice Bison, DO 09/17/20 [phone]

## 2020-09-20 ENCOUNTER — Telehealth: Payer: Self-pay | Admitting: Neurology

## 2020-09-20 NOTE — Telephone Encounter (Signed)
I took a look at the CT scan from a few days ago and compared it with 1 he did in 2020.   The brain is unchanged.  We do not need to do an MRI.

## 2020-09-20 NOTE — Telephone Encounter (Signed)
Pt' wife, [name] (on [place]) called Dr. Elesa Massed at the ED recommended to check with his neurologist.  Have him check the scans to see if he needs an MRI. Would like a call from the nurse.

## 2020-09-20 NOTE — Telephone Encounter (Signed)
LVM asking for wife to call office back

## 2020-09-20 NOTE — Progress Notes (Signed)
Remote pacemaker transmission.   

## 2020-09-22 NOTE — Telephone Encounter (Signed)
Called and spoke with pt. Relayed Dr. Bonnita Hollow message. She verbalized understanding. They will call if he develops any new or worsening sx moving forward.

## 2020-10-03 ENCOUNTER — Telehealth: Payer: Self-pay | Admitting: Internal Medicine

## 2020-10-03 NOTE — Telephone Encounter (Addendum)
**Note De-Identified  Obfuscation** I called [facility] and was advised that a PA is not required as the pts Eliquis is ready to pick up and that his ins plan is covering their part but that the pt has a high deductible.  I then called the pts wife [name] who states that she s/w [facility] (?) who advised her that they are faxing [place] a PA request for the pts Eliquis. I asked [name] multiple times if she is referring to [facility] as I am not familar with [facility] but she states that she is not either but when she called [facility] she was advised to call [facility] concerning the cost of the pts Eliquis and that they told her they were [facility]as well.  She states that they told her that they are faxing [place] a form to fill out and fax back to them.  I advised her that I will be looking for the Eliquis PA form and will address ASAP if I receive it and I advised her to contact [facility] again to inquire about the pts deductible as [facility] states a PA is not required.  We were on the phone for 24 mins discussing this and She is aware to call Jeani Hawking back [phone] at Dr Bonita Quin if she gets more information to help me with this PA if it is needed.  She verbalized understanding and thanked me for calling her back.

## 2020-10-03 NOTE — Telephone Encounter (Signed)
Will send to our Medication Assistance prgm nurse Jeani Hawking to see if pt may qualify for assistance.

## 2020-10-03 NOTE — Telephone Encounter (Signed)
**Note De-Identified  Obfuscation** [name] called back and states that Morgan advised her that the pt does owe a deductible and that she must have been talking to [facility] not [facility].  [name] is advised that if I receive the form from [facility] I will call to let them know and that if she has not heard from me by Friday 1/21 to call [facility] back. She verbalized understanding and thanked me for my help with this.

## 2020-10-03 NOTE — Telephone Encounter (Signed)
Pt c/o medication issue:  1. Name of Medication: ELIQUIS 5 MG TABS tablet  2. How are you currently taking this medication (dosage and times per day)? 5 mg twice daily  3. Are you having a reaction (difficulty breathing--STAT)? Cost   4. What is your medication issue? Wife said their insurance won't cover this medication anymore. The copay is $500 and they can not afford that.   Wife wanted to know if there was an alternate medication that he can be on. Please assist

## 2020-10-05 NOTE — Telephone Encounter (Signed)
We did receive a Eliquis PA request via fax from [employer]. I stated the PA through covermymeds. Key: [name]  I have called the pts wife [name] and made her aware so she knows that she does not need to contact [facility] again. She is also aware that I will call her back with the determination once I receive it from the pts plan.  She thanked me for keeping her updated.

## 2020-10-06 ENCOUNTER — Telehealth: Payer: Self-pay | Admitting: Neurology

## 2020-10-06 MED ORDER — AMANTADINE HCL 100 MG PO CAPS: 100.0000 mg | ORAL_CAPSULE | Freq: Two times a day (BID) | ORAL | 5 refills | Status: AC

## 2020-10-06 NOTE — Addendum Note (Signed)
Addended by: Wyvonnia Lora on: 10/06/2020 05:20 PM   Modules accepted: Orders

## 2020-10-06 NOTE — Telephone Encounter (Signed)
Called wife back and relayed Dr. Garth Bigness recommendation. She is agreeable to plan. I e-scribed to pharmacy.   She asked if Dr. Felecia Shelling would call in something for sinus infection for him. She called PCP. Dr. Maudie Mercury out, [place] retired. Dr. Theda Sers covering MD there and wants to see him first. Advised it would be best for him to see the doctor at PCP office first. We are unable to prescribe something for this. She will contact PCP office to set this up.

## 2020-10-06 NOTE — Telephone Encounter (Signed)
Lets send in amantadine 100 mg p.o. twice daily #60 with 5 refills.  This sometimes makes people with Parkinson's more alert

## 2020-10-06 NOTE — Telephone Encounter (Signed)
Pt.'s wife [name] is on [place]. She states husband is not able to walk far, he is not able to feed hisself, he is not able to stand up too long, his speech is slurred & he sleeps a lot. Wife is wondering if any other medication can be prescribed to help him as she states he is not doing well. Please advise.

## 2020-10-07 NOTE — Telephone Encounter (Signed)
**Note De-Identified  Obfuscation** Letter received from [facility] stating that they approved the pts Eliquis PA. Approval is valid until 10/06/2021  I have notified [facility] and the pts wife [name] of this approval.

## 2020-10-17 ENCOUNTER — Inpatient Hospital Stay (HOSPITAL_COMMUNITY)
Admission: EM | Admit: 2020-10-17 | Discharge: 2020-11-15 | DRG: 177 | Disposition: E | Payer: Medicare Other | Attending: Internal Medicine | Admitting: Internal Medicine

## 2020-10-17 ENCOUNTER — Other Ambulatory Visit: Payer: Self-pay

## 2020-10-17 ENCOUNTER — Emergency Department (HOSPITAL_COMMUNITY): Payer: Medicare Other

## 2020-10-17 ENCOUNTER — Encounter (HOSPITAL_COMMUNITY): Payer: Self-pay | Admitting: *Deleted

## 2020-10-17 DIAGNOSIS — G20 Parkinson's disease: Secondary | ICD-10-CM | POA: Diagnosis present

## 2020-10-17 DIAGNOSIS — J690 Pneumonitis due to inhalation of food and vomit: Secondary | ICD-10-CM | POA: Diagnosis not present

## 2020-10-17 DIAGNOSIS — R0602 Shortness of breath: Secondary | ICD-10-CM | POA: Diagnosis not present

## 2020-10-17 DIAGNOSIS — K219 Gastro-esophageal reflux disease without esophagitis: Secondary | ICD-10-CM | POA: Diagnosis present

## 2020-10-17 DIAGNOSIS — R338 Other retention of urine: Secondary | ICD-10-CM | POA: Diagnosis present

## 2020-10-17 DIAGNOSIS — T17908A Unspecified foreign body in respiratory tract, part unspecified causing other injury, initial encounter: Secondary | ICD-10-CM

## 2020-10-17 DIAGNOSIS — N401 Enlarged prostate with lower urinary tract symptoms: Secondary | ICD-10-CM | POA: Diagnosis present

## 2020-10-17 DIAGNOSIS — I071 Rheumatic tricuspid insufficiency: Secondary | ICD-10-CM | POA: Diagnosis present

## 2020-10-17 DIAGNOSIS — J189 Pneumonia, unspecified organism: Secondary | ICD-10-CM | POA: Diagnosis present

## 2020-10-17 DIAGNOSIS — I4821 Permanent atrial fibrillation: Secondary | ICD-10-CM | POA: Diagnosis present

## 2020-10-17 DIAGNOSIS — Z809 Family history of malignant neoplasm, unspecified: Secondary | ICD-10-CM

## 2020-10-17 DIAGNOSIS — Z950 Presence of cardiac pacemaker: Secondary | ICD-10-CM

## 2020-10-17 DIAGNOSIS — Z6822 Body mass index (BMI) 22.0-22.9, adult: Secondary | ICD-10-CM

## 2020-10-17 DIAGNOSIS — G9341 Metabolic encephalopathy: Secondary | ICD-10-CM | POA: Diagnosis present

## 2020-10-17 DIAGNOSIS — R627 Adult failure to thrive: Secondary | ICD-10-CM | POA: Diagnosis present

## 2020-10-17 DIAGNOSIS — Z7901 Long term (current) use of anticoagulants: Secondary | ICD-10-CM

## 2020-10-17 DIAGNOSIS — G20A1 Parkinson's disease without dyskinesia, without mention of fluctuations: Secondary | ICD-10-CM | POA: Diagnosis present

## 2020-10-17 DIAGNOSIS — I2510 Atherosclerotic heart disease of native coronary artery without angina pectoris: Secondary | ICD-10-CM | POA: Diagnosis present

## 2020-10-17 DIAGNOSIS — Z8249 Family history of ischemic heart disease and other diseases of the circulatory system: Secondary | ICD-10-CM

## 2020-10-17 DIAGNOSIS — I495 Sick sinus syndrome: Secondary | ICD-10-CM | POA: Diagnosis present

## 2020-10-17 DIAGNOSIS — J159 Unspecified bacterial pneumonia: Secondary | ICD-10-CM | POA: Diagnosis present

## 2020-10-17 DIAGNOSIS — Z79899 Other long term (current) drug therapy: Secondary | ICD-10-CM

## 2020-10-17 DIAGNOSIS — E782 Mixed hyperlipidemia: Secondary | ICD-10-CM | POA: Diagnosis present

## 2020-10-17 DIAGNOSIS — I110 Hypertensive heart disease with heart failure: Secondary | ICD-10-CM | POA: Diagnosis present

## 2020-10-17 DIAGNOSIS — Z888 Allergy status to other drugs, medicaments and biological substances status: Secondary | ICD-10-CM

## 2020-10-17 DIAGNOSIS — Z85828 Personal history of other malignant neoplasm of skin: Secondary | ICD-10-CM

## 2020-10-17 DIAGNOSIS — J9601 Acute respiratory failure with hypoxia: Secondary | ICD-10-CM | POA: Diagnosis present

## 2020-10-17 DIAGNOSIS — R54 Age-related physical debility: Secondary | ICD-10-CM | POA: Diagnosis present

## 2020-10-17 DIAGNOSIS — E876 Hypokalemia: Secondary | ICD-10-CM | POA: Diagnosis not present

## 2020-10-17 DIAGNOSIS — Z87891 Personal history of nicotine dependence: Secondary | ICD-10-CM

## 2020-10-17 DIAGNOSIS — Z20822 Contact with and (suspected) exposure to covid-19: Secondary | ICD-10-CM | POA: Diagnosis present

## 2020-10-17 DIAGNOSIS — R531 Weakness: Secondary | ICD-10-CM

## 2020-10-17 DIAGNOSIS — I5032 Chronic diastolic (congestive) heart failure: Secondary | ICD-10-CM | POA: Diagnosis present

## 2020-10-17 DIAGNOSIS — Z885 Allergy status to narcotic agent status: Secondary | ICD-10-CM

## 2020-10-17 DIAGNOSIS — Z66 Do not resuscitate: Secondary | ICD-10-CM | POA: Diagnosis present

## 2020-10-17 DIAGNOSIS — Z515 Encounter for palliative care: Secondary | ICD-10-CM

## 2020-10-17 NOTE — ED Triage Notes (Signed)
Pt arrived by [facility] from home. Per family, spo2 has been in the 96s with recent cough and sob. No recent covid test done. Was seen here on 12/31 and they reported pt having generalized weakness and difficult ambulating since going home.

## 2020-10-18 ENCOUNTER — Encounter (HOSPITAL_COMMUNITY): Payer: Self-pay | Admitting: Internal Medicine

## 2020-10-18 DIAGNOSIS — G20 Parkinson's disease: Secondary | ICD-10-CM | POA: Diagnosis present

## 2020-10-18 DIAGNOSIS — R531 Weakness: Secondary | ICD-10-CM

## 2020-10-18 DIAGNOSIS — Z20822 Contact with and (suspected) exposure to covid-19: Secondary | ICD-10-CM | POA: Diagnosis present

## 2020-10-18 DIAGNOSIS — J189 Pneumonia, unspecified organism: Secondary | ICD-10-CM | POA: Diagnosis not present

## 2020-10-18 DIAGNOSIS — I351 Nonrheumatic aortic (valve) insufficiency: Secondary | ICD-10-CM | POA: Diagnosis not present

## 2020-10-18 DIAGNOSIS — J690 Pneumonitis due to inhalation of food and vomit: Secondary | ICD-10-CM | POA: Diagnosis present

## 2020-10-18 DIAGNOSIS — I110 Hypertensive heart disease with heart failure: Secondary | ICD-10-CM | POA: Diagnosis present

## 2020-10-18 DIAGNOSIS — I4821 Permanent atrial fibrillation: Secondary | ICD-10-CM

## 2020-10-18 DIAGNOSIS — K2100 Gastro-esophageal reflux disease with esophagitis, without bleeding: Secondary | ICD-10-CM | POA: Diagnosis not present

## 2020-10-18 DIAGNOSIS — I495 Sick sinus syndrome: Secondary | ICD-10-CM | POA: Diagnosis present

## 2020-10-18 DIAGNOSIS — R54 Age-related physical debility: Secondary | ICD-10-CM | POA: Diagnosis present

## 2020-10-18 DIAGNOSIS — Z515 Encounter for palliative care: Secondary | ICD-10-CM | POA: Diagnosis not present

## 2020-10-18 DIAGNOSIS — G9341 Metabolic encephalopathy: Secondary | ICD-10-CM

## 2020-10-18 DIAGNOSIS — R0602 Shortness of breath: Secondary | ICD-10-CM | POA: Diagnosis present

## 2020-10-18 DIAGNOSIS — I2510 Atherosclerotic heart disease of native coronary artery without angina pectoris: Secondary | ICD-10-CM | POA: Diagnosis present

## 2020-10-18 DIAGNOSIS — K219 Gastro-esophageal reflux disease without esophagitis: Secondary | ICD-10-CM | POA: Diagnosis present

## 2020-10-18 DIAGNOSIS — R627 Adult failure to thrive: Secondary | ICD-10-CM | POA: Diagnosis present

## 2020-10-18 DIAGNOSIS — I340 Nonrheumatic mitral (valve) insufficiency: Secondary | ICD-10-CM | POA: Diagnosis not present

## 2020-10-18 DIAGNOSIS — Z6822 Body mass index (BMI) 22.0-22.9, adult: Secondary | ICD-10-CM | POA: Diagnosis not present

## 2020-10-18 DIAGNOSIS — J159 Unspecified bacterial pneumonia: Secondary | ICD-10-CM | POA: Diagnosis present

## 2020-10-18 DIAGNOSIS — I071 Rheumatic tricuspid insufficiency: Secondary | ICD-10-CM | POA: Diagnosis present

## 2020-10-18 DIAGNOSIS — I361 Nonrheumatic tricuspid (valve) insufficiency: Secondary | ICD-10-CM | POA: Diagnosis not present

## 2020-10-18 DIAGNOSIS — I5032 Chronic diastolic (congestive) heart failure: Secondary | ICD-10-CM | POA: Diagnosis present

## 2020-10-18 DIAGNOSIS — N401 Enlarged prostate with lower urinary tract symptoms: Secondary | ICD-10-CM | POA: Diagnosis present

## 2020-10-18 DIAGNOSIS — R338 Other retention of urine: Secondary | ICD-10-CM | POA: Diagnosis present

## 2020-10-18 DIAGNOSIS — E782 Mixed hyperlipidemia: Secondary | ICD-10-CM | POA: Diagnosis present

## 2020-10-18 DIAGNOSIS — Z66 Do not resuscitate: Secondary | ICD-10-CM | POA: Diagnosis present

## 2020-10-18 DIAGNOSIS — E876 Hypokalemia: Secondary | ICD-10-CM | POA: Diagnosis not present

## 2020-10-18 DIAGNOSIS — Z885 Allergy status to narcotic agent status: Secondary | ICD-10-CM | POA: Diagnosis not present

## 2020-10-18 DIAGNOSIS — J9601 Acute respiratory failure with hypoxia: Secondary | ICD-10-CM | POA: Diagnosis present

## 2020-10-18 LAB — I-STAT VENOUS BLOOD GAS, ED
Acid-Base Excess: 0 mmol/L (ref 0.0–2.0)
Bicarbonate: 26.2 mmol/L (ref 20.0–28.0)
Calcium, Ion: 1.04 mmol/L — ABNORMAL LOW (ref 1.15–1.40)
HCT: 42 % (ref 39.0–52.0)
Hemoglobin: 14.3 g/dL (ref 13.0–17.0)
O2 Saturation: 96 %
Potassium: 4.3 mmol/L (ref 3.5–5.1)
Sodium: 142 mmol/L (ref 135–145)
TCO2: 28 mmol/L (ref 22–32)
pCO2, Ven: 48.7 mmHg (ref 44.0–60.0)
pH, Ven: 7.339 (ref 7.250–7.430)
pO2, Ven: 88 mmHg — ABNORMAL HIGH (ref 32.0–45.0)

## 2020-10-18 LAB — URINALYSIS, ROUTINE W REFLEX MICROSCOPIC
Glucose, UA: NEGATIVE mg/dL
Ketones, ur: NEGATIVE mg/dL
Leukocytes,Ua: NEGATIVE
Nitrite: NEGATIVE
Protein, ur: NEGATIVE mg/dL
Specific Gravity, Urine: >1.03 — ABNORMAL HIGH (ref 1.005–1.030)
pH: 6 (ref 5.0–8.0)

## 2020-10-18 LAB — BASIC METABOLIC PANEL
Anion gap: 13 (ref 5–15)
BUN: 16 mg/dL (ref 8–23)
CO2: 22 mmol/L (ref 22–32)
Calcium: 8.4 mg/dL — ABNORMAL LOW (ref 8.9–10.3)
Chloride: 105 mmol/L (ref 98–111)
Creatinine, Ser: 0.87 mg/dL (ref 0.61–1.24)
GFR, Estimated: >60 mL/min (ref >60)
Glucose, Bld: 106 mg/dL — ABNORMAL HIGH (ref 70–99)
Potassium: 3.6 mmol/L (ref 3.5–5.1)
Sodium: 140 mmol/L (ref 135–145)

## 2020-10-18 LAB — SARS CORONAVIRUS 2 BY RT PCR (HOSPITAL ORDER, PERFORMED IN ~~LOC~~ HOSPITAL LAB): SARS Coronavirus 2: NEGATIVE

## 2020-10-18 LAB — URINALYSIS, MICROSCOPIC (REFLEX)

## 2020-10-18 LAB — POC SARS CORONAVIRUS 2 AG -  ED: SARS Coronavirus 2 Ag: NEGATIVE

## 2020-10-18 LAB — STREP PNEUMONIAE URINARY ANTIGEN: Strep Pneumo Urinary Antigen: NEGATIVE

## 2020-10-18 LAB — CBC
HCT: 42.9 % (ref 39.0–52.0)
Hemoglobin: 13.8 g/dL (ref 13.0–17.0)
MCH: 29.8 pg (ref 26.0–34.0)
MCHC: 32.2 g/dL (ref 30.0–36.0)
MCV: 92.7 fL (ref 80.0–100.0)
Platelets: 238 10*3/uL (ref 150–400)
RBC: 4.63 MIL/uL (ref 4.22–5.81)
RDW: 14.4 % (ref 11.5–15.5)
WBC: 9.6 10*3/uL (ref 4.0–10.5)
nRBC: 0 % (ref 0.0–0.2)

## 2020-10-18 LAB — TROPONIN I (HIGH SENSITIVITY)
Troponin I (High Sensitivity): 17 ng/L (ref <18)
Troponin I (High Sensitivity): 18 ng/L — ABNORMAL HIGH (ref <18)

## 2020-10-18 LAB — MAGNESIUM: Magnesium: 2.2 mg/dL (ref 1.7–2.4)

## 2020-10-18 LAB — TSH: TSH: 1.197 u[IU]/mL (ref 0.350–4.500)

## 2020-10-18 LAB — PROCALCITONIN: Procalcitonin: <0.1 ng/mL

## 2020-10-18 LAB — PHOSPHORUS: Phosphorus: 4.1 mg/dL (ref 2.5–4.6)

## 2020-10-18 MED ORDER — FAMOTIDINE 20 MG PO TABS
20.0000 mg | ORAL_TABLET | Freq: Every day | ORAL | Status: DC
  Administered 2020-10-18 – 2020-10-23 (×6): 20 mg via ORAL
  Filled 2020-10-18 (×6): qty 1

## 2020-10-18 MED ORDER — SODIUM CHLORIDE 0.9 % IV SOLN
2.0000 g | Freq: Once | INTRAVENOUS | Status: AC
  Administered 2020-10-18: 2 g via INTRAVENOUS
  Filled 2020-10-18: qty 20

## 2020-10-18 MED ORDER — SODIUM CHLORIDE 0.9 % IV SOLN
2.0000 g | INTRAVENOUS | Status: DC
  Administered 2020-10-19 – 2020-10-23 (×5): 2 g via INTRAVENOUS
  Filled 2020-10-18: qty 20
  Filled 2020-10-18: qty 2
  Filled 2020-10-18 (×2): qty 20
  Filled 2020-10-18 (×2): qty 2
  Filled 2020-10-18: qty 20

## 2020-10-18 MED ORDER — SODIUM CHLORIDE 0.9 % IV SOLN
500.0000 mg | INTRAVENOUS | Status: DC
  Administered 2020-10-19: 500 mg via INTRAVENOUS
  Filled 2020-10-18: qty 500

## 2020-10-18 MED ORDER — CARBIDOPA-LEVODOPA 25-100 MG PO TABS
1.0000 | ORAL_TABLET | Freq: Four times a day (QID) | ORAL | Status: DC
  Administered 2020-10-18 – 2020-10-23 (×18): 1 via ORAL
  Filled 2020-10-18 (×28): qty 1

## 2020-10-18 MED ORDER — SODIUM CHLORIDE 0.9 % IV SOLN
500.0000 mg | Freq: Once | INTRAVENOUS | Status: AC
  Administered 2020-10-18: 500 mg via INTRAVENOUS
  Filled 2020-10-18: qty 500

## 2020-10-18 MED ORDER — ACETAMINOPHEN 325 MG PO TABS
650.0000 mg | ORAL_TABLET | Freq: Four times a day (QID) | ORAL | Status: DC | PRN
  Administered 2020-10-22 – 2020-10-23 (×2): 650 mg via ORAL
  Filled 2020-10-18 (×2): qty 2

## 2020-10-18 MED ORDER — MEMANTINE HCL 5 MG PO TABS
5.0000 mg | ORAL_TABLET | Freq: Two times a day (BID) | ORAL | Status: DC
  Administered 2020-10-18 – 2020-10-23 (×11): 5 mg via ORAL
  Filled 2020-10-18 (×13): qty 1

## 2020-10-18 MED ORDER — ACETAMINOPHEN 650 MG RE SUPP: 650.0000 mg | Freq: Four times a day (QID) | RECTAL | Status: DC | PRN

## 2020-10-18 MED ORDER — SODIUM CHLORIDE 0.9 % IV BOLUS
500.0000 mL | Freq: Once | INTRAVENOUS | Status: AC
  Administered 2020-10-18: 500 mL via INTRAVENOUS

## 2020-10-18 MED ORDER — VITAMIN B-12 1000 MCG PO TABS
1000.0000 ug | ORAL_TABLET | Freq: Every day | ORAL | Status: DC
  Administered 2020-10-18 – 2020-10-23 (×6): 1000 ug via ORAL
  Filled 2020-10-18 (×6): qty 1

## 2020-10-18 MED ORDER — AMANTADINE HCL 100 MG PO CAPS
100.0000 mg | ORAL_CAPSULE | Freq: Two times a day (BID) | ORAL | Status: DC
  Administered 2020-10-18 – 2020-10-20 (×6): 100 mg via ORAL
  Filled 2020-10-18 (×9): qty 1

## 2020-10-18 MED ORDER — APIXABAN 5 MG PO TABS
5.0000 mg | ORAL_TABLET | Freq: Two times a day (BID) | ORAL | Status: DC
  Administered 2020-10-18 – 2020-10-23 (×11): 5 mg via ORAL
  Filled 2020-10-18 (×11): qty 1

## 2020-10-18 NOTE — ED Notes (Signed)
Oxygen tank replaced. Patient moved to a recliner for comfort

## 2020-10-18 NOTE — Progress Notes (Signed)
Patient seen and examined.  Admitted early morning hours by nighttime hospitalist.  I meet with patient and his wife at the bedside he is a still in the emergency room.  Apparently, he is having upper respiratory symptoms for about 2 weeks.  He does have a history of sinusitis.  Last few days he is very short of breath and unable to ambulate around so came to ER. Family is unvaccinated. Chest x-ray consistent with bilateral pneumonia. COVID-19 test x2 are negative.  Plan: Continue to treat as community-acquired pneumonia with broad-spectrum antibiotics, respiratory therapy, chest physiotherapy, oxygen to keep saturation more than 90%. Blood cultures and sputum cultures as much we can get. Incentive spirometry and flutter valve. Legionella and streptococcal antigen. COVID-19 is negative on repeat exam, will check respiratory virus panel.

## 2020-10-18 NOTE — ED Notes (Signed)
Pt given a cup of water 

## 2020-10-18 NOTE — H&P (Signed)
History and Physical    PLEASE NOTE THAT DRAGON DICTATION SOFTWARE WAS USED IN THE CONSTRUCTION OF THIS NOTE.   [name] VZC:[id number] DOB: [birth year] DOA: 09/22/2020  PCP: Ryan Gravel, MD Patient coming from: home   I have personally briefly reviewed patient's old medical records in [place]  Chief Complaint: Shortness of breath  HPI: [name] is a 87 y.o. male with medical history significant for persistent atrial fibrillation chronically anticoagulated on Eliquis and complicated by sick sinus syndrome status post pacemaker placement, Parkinson's disease, who is admitted to [facility] on 10/06/2020 with suspected community-acquired pneumonia after presenting from home to [facility] Emergency Department complaining of shortness of breath.   The following history is provided by the patient as well as his wife, who is present at bedside, in addition to my discussions with the emergency department physician and via chart review.  The patient reportedly has been experiencing 4 to 5 days of progressive shortness of breath associated with subjective fever new onset nonproductive cough.  Not associated with chills, full body rigors, or generalized myalgias.  Shortness of breath is not associate with orthopnea, PND, or peripheral edema.  No associated chest pain, palpitations, or diaphoresis.  [name] conveys that the patient has also exhibited evidence of confusion relative to his baseline mental status over that timeframe, and notes that he has also exhibited general weakness over that time, specifically requiring progressive assistance with ambulation relative to his baseline amatory requirements.  No preceding trauma.  No nausea, vomiting, diarrhea.  Not associate with any hemoptysis.  No recent traveling or known COVID-19 exposures.  No recent trauma, surgical procedures, or periods of prolonged diminished ambulatory activity.  Patient had a brief smoking history in the  1960s, but is not smoke subsequent to that.  No known chronic underlying pulmonary pathology, and no known baseline supplemental oxygen requirements.    ED Course:  Vital signs in the ED were notable for the following: Temperature max 98.5, heart rate 56-60; blood pressure 121/43 - 124/78; respiratory rate 18-24; initial oxygen saturation noted to be in the mid 80s, which improved to 94 to 90% on 3 L nasal cannula.   Labs were notable for the following: BMP was notable for the following: Sodium 140, potassium 3.6, bicarbonate 22, creatinine 0.87, glucose 106.  High-sensitivity troponin I initially found to be 17, with repeat value 18.  CBC was notable for the following: White blood cell count of 9600, hemoglobin 13.8.  Nasopharyngeal COVID-19 PCR was performed in the ED this evening, and found to be negative.  Chest x-ray showed mixed patchy and coarse opacities in the bilateral lung suggestive of atypical pneumonia in the absence of evidence of edema, effusion, or pneumothorax.  EKG showed ventricular paced rhythm with ventricular rate 62, without evidence of acute ischemic changes.  While in the ED, the following were administered: Azithromycin 500 mg IV x1, Rocephin 2 g IV x1, and a 500 cc normal saline bolus.  Subsequently, the patient was admitted to the med telemetry floor for further evaluation and management of acute hypoxic respiratory distress in the setting of suspected atypical community-acquired pneumonia.    Review of Systems: As per HPI otherwise 10 point review of systems negative.   Past Medical History:  Diagnosis Date  . CAD (coronary artery disease) 09/2008  . Cancer ([place])    basal cell carcinoma  . CHF (congestive heart failure) ([place])   . Complication of anesthesia    06/12/11 anesthesia  made legs asleep also and went into chf and ended up in ICU after gallbladder surgery  . Diastolic dysfunction    hx of chf due to fluid overload 9/12   . Dysrhythmia    atrial  fibrillation  . GERD (gastroesophageal reflux disease)   . History of blood product transfusion    d/t bleeding ulcer  . Hypertension   . Macular degeneration (senile) of retina   . Persistent atrial fibrillation ([place])   . PONV (postoperative nausea and vomiting)   . Presence of permanent cardiac pacemaker 2010  . Shortness of breath dyspnea    exertion  . Sick sinus syndrome (HCC)    St. Jude  . Syncope    resolved s/p PPM    Past Surgical History:  Procedure Laterality Date  . CHOLECYSTECTOMY  06/12/11  . INGUINAL HERNIA REPAIR Left 10/06/2014   Procedure: OPEN REPAIR LEFT INGUINAL HERNIA ;  Surgeon: Fanny Skates, MD;  Location: [place];  Service: General;  Laterality: Left;  . INSERTION OF MESH Left 10/06/2014   Procedure: INSERTION OF MESH;  Surgeon: Fanny Skates, MD;  Location: [place];  Service: General;  Laterality: Left;  . IR KYPHO LUMBAR INC FX REDUCE BONE BX UNI/BIL CANNULATION INC/IMAGING  12/26/2017  . IR RADIOLOGIST EVAL & MGMT  12/12/2017  . LAPAROSCOPIC CHOLECYSTECTOMY W/ CHOLANGIOGRAPHY  06/12/2011  . PACEMAKER INSERTION  09/28/08   SJM Zephyr XL DR  . perforated ulcer    . PPM GENERATOR CHANGEOUT N/A 10/21/2018   Procedure: PPM GENERATOR CHANGEOUT;  Surgeon: Thompson Grayer, MD;  Location: [place] [facility];  Service: Cardiovascular;  Laterality: N/A;  . right inguinal hernia repair  11/19/2002   with mesh  . TRANSURETHRAL RESECTION OF BLADDER TUMOR  09/06/2011   Procedure: TRANSURETHRAL RESECTION OF BLADDER TUMOR (TURBT);  Surgeon: Ailene Rud, MD;  [facility];  Service: Urology;  Laterality: N/A;  . TRANSURETHRAL RESECTION OF PROSTATE  11/94/1740  . umblical hernia  04/30/4817    Social History:  reports that he quit smoking about 62 years ago. He has never used smokeless tobacco. He reports that he does not drink alcohol and does not use drugs.   Allergies  Allergen Reactions  . Hydrocodone     "Made him feel bad"  . Hydroxyzine Anxiety     Family History  Problem Relation Age of Onset  . Heart attack Mother 32       MI  . Heart failure Father   . Heart attack Brother        Late 20s  . Cancer Sister      Prior to Admission medications   Medication Sig Start Date End Date Taking? Authorizing Provider  acetaminophen (TYLENOL) 325 MG tablet Take 650 mg by mouth every 6 (six) hours as needed.    [provider]  amantadine (SYMMETREL) 100 MG capsule Take 1 capsule (100 mg total) by mouth 2 (two) times daily. 10/06/20   Sater, Nanine Means, MD  carbidopa-levodopa (SINEMET) 25-100 MG tablet Take 1 tablet by mouth 4 (four) times daily. 07/14/20   Sater, Nanine Means, MD  Cholecalciferol (VITAMIN D3) 50 MCG (2000 UT) TABS Take 2,000 Units by mouth daily.    [provider]  ELDERBERRY PO Take 500 mg by mouth daily.    [provider]  ELIQUIS 5 MG TABS tablet TAKE 1 TABLET BY MOUTH TWICE A DAY 01/25/20   Allred, Jeneen Rinks, MD  famotidine (PEPCID) 20 MG tablet Take 20 mg by  mouth daily.    [provider]  Magnesium 250 MG TABS Take 250 mg by mouth every other day.     [provider]  memantine (NAMENDA) 5 MG tablet Take 5 mg by mouth 2 (two) times daily. 05/25/19   [provider]  triamcinolone cream (KENALOG) 0.1 % Apply 1 application topically 3 (three) times daily.  05/25/19   [provider]  vitamin B-12 (CYANOCOBALAMIN) 1000 MCG tablet Take 1,000 mcg by mouth daily.    [provider]  Zinc 50 MG TABS Take 50 mg by mouth daily.    [provider]     Objective    Physical Exam: Vitals:   09/28/2020 2254 10/18/20 0131 10/18/20 0425 10/18/20 0529  BP: (!) 121/43 124/78 (!) 127/109 122/78  Pulse: 60 (!) 56 60 60  Resp: _0 (!) 24  Temp: 98.5 F (36.9 C)   97.8 F (36.6 C)  TempSrc: Oral   Oral  SpO2: 94% 96% 98% 93%    General: appears to be stated age; alert; mild increased wob noted.  Skin: warm, dry, no rash Head:  AT/[place] Mouth:   Oral mucosa membranes appear moist, normal dentition Neck: supple; trachea midline Heart:  RRR; did not appreciate any M/R/G Lungs: CTAB, did not appreciate any wheezes, rales, or rhonchi Abdomen: + BS; soft, ND, NT Vascular: 2+ pedal pulses b/l; 2+ radial pulses b/l Extremities: no peripheral edema, no muscle wasting  Labs on Admission: I have personally reviewed following labs and imaging studies  CBC: Recent Labs  Lab 10/12/2020 2300  WBC 9.6  HGB 13.8  HCT 42.9  MCV 92.7  PLT 480   Basic Metabolic Panel: Recent Labs  Lab 09/18/2020 2300  NA 140  K 3.6  CL 105  CO2 22  GLUCOSE 106*  BUN 16  CREATININE 0.87  CALCIUM 8.4*   GFR: CrCl cannot be calculated (Unknown ideal weight.). Liver Function Tests: No results for input(s): AST, ALT, ALKPHOS, BILITOT, PROT, ALBUMIN in the last 168 hours. No results for input(s): LIPASE, AMYLASE in the last 168 hours. No results for input(s): AMMONIA in the last 168 hours. Coagulation Profile: No results for input(s): INR, PROTIME in the last 168 hours. Cardiac Enzymes: No results for input(s): CKTOTAL, CKMB, CKMBINDEX, TROPONINI in the last 168 hours. BNP (last 3 results) No results for input(s): PROBNP in the last 8760 hours. HbA1C: No results for input(s): HGBA1C in the last 72 hours. CBG: No results for input(s): GLUCAP in the last 168 hours. Lipid Profile: No results for input(s): CHOL, HDL, LDLCALC, TRIG, CHOLHDL, LDLDIRECT in the last 72 hours. Thyroid Function Tests: No results for input(s): TSH, T4TOTAL, FREET4, T3FREE, THYROIDAB in the last 72 hours. Anemia Panel: No results for input(s): VITAMINB12, FOLATE, FERRITIN, TIBC, IRON, RETICCTPCT in the last 72 hours. Urine analysis:    Component Value Date/Time   COLORURINE YELLOW 06/11/2019 0004   APPEARANCEUR CLEAR 06/11/2019 0004   LABSPEC 1.026 06/11/2019 0004   PHURINE 6.0 06/11/2019 0004   GLUCOSEU NEGATIVE 06/11/2019 0004   HGBUR SMALL (A) 06/11/2019 0004    BILIRUBINUR NEGATIVE 06/11/2019 0004   KETONESUR 5 (A) 06/11/2019 0004   PROTEINUR NEGATIVE 06/11/2019 0004   UROBILINOGEN 1.0 07/30/2014 0920   NITRITE NEGATIVE 06/11/2019 0004   LEUKOCYTESUR NEGATIVE 06/11/2019 0004    Radiological Exams on Admission: DG Chest Portable 1 View  Result Date: 09/22/2020 CLINICAL DATA:  Shortness of breath, hypoxic EXAM: PORTABLE CHEST 1 VIEW COMPARISON:  Radiograph 06/11/2019  FINDINGS: Mixed areas of coarse heterogeneous and patchy opacity present in the lungs in a mid to lower lung and peripheral predominance. Additional coalescent opacity noted along right heart border. No pneumothorax. Trace left effusion may be present. No right effusion is seen. Enlarged cardiac silhouette is similar to priors. Pacer pack overlies the left chest wall with leads at the right atrium and cardiac apex. Surgical clips noted at the diaphragmatic hiatus in the right upper quadrant. IMPRESSION: 1. Mixed patchy and coarse opacities in the lungs with a mid to lower lung and peripheral predominance, with more coalescent opacity along the right heart border. Could reflect an acute pneumonia including atypical viral etiologies such as COVID 19. Given the somewhat masslike appearance of the right hilar opacity, recommend follow-up imaging to resolution. Electronically Signed   By: Lovena Le M.D.   On: 10/09/2020 23:15     EKG: Independently reviewed, with result as described above.    Assessment/Plan   JOHNTAY DOOLEN is a 87 y.o. male with medical history significant for persistent atrial fibrillation chronically anticoagulated on Eliquis and complicated by sick sinus syndrome status post pacemaker placement, Parkinson's disease, who is admitted to [facility] on 10/12/2020 with suspected community-acquired pneumonia after presenting from home to [facility] Emergency Department complaining of shortness of breath.    Principal Problem:   Community acquired pneumonia Active  Problems:   GERD   Permanent atrial fibrillation   Shortness of breath   Parkinson's disease ([place])   Acute metabolic encephalopathy   Generalized weakness   #) Community-acquired pneumonia: Diagnosis on the basis of 5 to 6 days of progressive shortness of breath associated with new onset cough, subjective fever, acute hypoxic respiratory distress, and chest x-ray showing evidence of extension course opacities in bilateral lungs suggestive of atypical pneumonia.  Of note nasopharyngeal COVID-19 PCR performed in the ED this evening was found to be negative.  Aside from mild tachypnea, SIRS criteria are otherwise not met, and therefore presentation is not consistent with sepsis.  Overall suspect atypical pneumonia most consistent with community-acquired pneumonia.  The patient has received appropriate coverage in the ED via a azithromycin and Rocephin, which will be continued.  Plan: Blood cultures x2.  Continue azithromycin and Rocephin.  Repeat CBC with differential in the morning.  Add on serum procalcitonin.  Check mycoplasma antigens.  Check strep pneumoniae urine antigen as well as Legionella urine antigen.  Flutter valve.  Monitor on continuous pulse oximetry.     #) Acute hypoxic respiratory stress: In the setting of no baseline supplemental oxygen requirements, initial oxygen saturation noted to be in the mid 80s, requiring 3 L nasal cannula in order to maintain oxygen saturations greater than or equal to 94%.  Appears to be on the basis of community-acquired pneumonia, as further described above, with nasopharyngeal COVID-19 PCR performed in the ED this evening found to be negative.  No clinical or radiographic evidence to suggest acute decompensated heart failure.  ACS felt to be less likely in the absence of any associated chest pain, negative troponin values, and EKG showing no evidence of acute ischemic changes.  Clinically, acute pulmonary embolism also appears to be less likely.  Plan:  Work-up and management presenting community-acquired pneumonia, as above.  Monitor continuous pulse oximetry.  Monitor on telemetry.  Add on serum phosphorus and magnesium levels.  Flutter valve.  Repeat CBC with differential in the morning.  We will check a third set of troponin enzymes as well.     #)  Acute metabolic encephalopathy: [name] feels that the patient is exhibited to 3 days confusion relative to baseline mental status.  Suspect this is metabolic in nature as result of physiologic stress stemming from presenting community-acquired pneumonia.  Not associated with any acute focal neurologic deficits to suggest acute ischemic CVA.  No other overt underlying source of infectious process at this time, although will check urinalysis to further evaluate this possibility.  Plan: Work-up and management of presenting [facility] pneumonia, as above.  Check VBG, TSH, urinalysis.  Repeat BMP in the morning.  Nursing bedside swallow screen prior to initiation of diet.     #) Generalized weakness: 3 to 4 days of generalized weakness in the absence of any acute focal neurologic deficit.  He was given recent physiologic stress stemming from presenting community-acquired pneumonia.  Of note, the patient lives at home with his wife, who is finding progressively difficult to provide the level of ambulatory assistance that the patient has required over the last few days.  Plan: Work-up and management of acquired pneumonia, as above.  Check TSH.  I placed a physical therapy consult ordered for the morning.      #) Persistent atrial fibrillation: Documented history of such. In the setting of a CHA2DS2-VASc score of 5, there is an indication for the patient to be on chronic anticoagulation for thromboembolic prophylaxis. Consistent with this, the patient is chronically anticoagulated on Eliquis. Home AV nodal blocking regimen: None.  Most recent echocardiogram was performed in May 2015 showed mild  concentric LVH, LVEF 50 to 55%, no evidence of focal wall motion normalities, moderately dilated left atrium, mild to moderate tricuspid regurgitation.  Of note, diastolic function was not able to be assessed per this most recent echo.  Patient's history of atrial fibrillation has been complicated by sick sinus syndrome status post pacemaker placement, originally 2010.  Plan: Monitor on telemetry.  Add on serum magnesium level.  Continue home Eliquis.     #) Parkinson's disease: On Sinemet as well as amantadine as an outpatient.  Plan: Continue home Sinemet and amantadine.      #) GERD: On famotidine as an outpatient.  Plan: Continue home H2 blocker.     DVT prophylaxis: Continue home Eliquis Code Status: DNR Disposition Plan: Per Rounding Team Consults called: none  Admission status: Inpatient; med telemetry.    Of note, this patient was added by me to the following Admit List/Treatment Team:  mcadmits     PLEASE NOTE THAT DRAGON DICTATION SOFTWARE WAS USED IN THE CONSTRUCTION OF THIS NOTE.   Rhetta Mura DO [facility] Pager [phone] From [place]  10/18/2020, 6:34 AM

## 2020-10-18 NOTE — ED Provider Notes (Signed)
[name] Emergency Department Provider Note MRN:  [id number]  Arrival date & time: 10/18/20     Chief Complaint   Shortness of Breath and Cough   History of Present Illness   [name] is a 87 y.o. year-old male with a history of CAD, CHF presenting to the ED with chief complaint of SOB.  Cough, shortness of breath, fever for the past few days.  Worsening confusion, generalized weakness, not able to get out of bed.  I was unable to obtain an accurate HPI, PMH, or ROS due to the patient's altered mental status.  Level 5 caveat.  Review of Systems  Positive for fever, cough, weakness, shortness of breath.  Patient's Health History    Past Medical History:  Diagnosis Date  . CAD (coronary artery disease) 09/2008  . Cancer ([place])    basal cell carcinoma  . CHF (congestive heart failure) ([place])   . Complication of anesthesia    06/12/11 anesthesia made legs asleep also and went into chf and ended up in ICU after gallbladder surgery  . Diastolic dysfunction    hx of chf due to fluid overload 9/12   . Dysrhythmia    atrial fibrillation  . GERD (gastroesophageal reflux disease)   . History of blood product transfusion    d/t bleeding ulcer  . Hypertension   . Macular degeneration (senile) of retina   . Persistent atrial fibrillation ([place])   . PONV (postoperative nausea and vomiting)   . Presence of permanent cardiac pacemaker 2010  . Shortness of breath dyspnea    exertion  . Sick sinus syndrome (HCC)    St. Jude  . Syncope    resolved s/p PPM    Past Surgical History:  Procedure Laterality Date  . CHOLECYSTECTOMY  06/12/11  . INGUINAL HERNIA REPAIR Left 10/06/2014   Procedure: OPEN REPAIR LEFT INGUINAL HERNIA ;  Surgeon: Fanny Skates, MD;  Location: [place];  Service: General;  Laterality: Left;  . INSERTION OF MESH Left 10/06/2014   Procedure: INSERTION OF MESH;  Surgeon: Fanny Skates, MD;  Location: [place];  Service: General;  Laterality:  Left;  . IR KYPHO LUMBAR INC FX REDUCE BONE BX UNI/BIL CANNULATION INC/IMAGING  12/26/2017  . IR RADIOLOGIST EVAL & MGMT  12/12/2017  . LAPAROSCOPIC CHOLECYSTECTOMY W/ CHOLANGIOGRAPHY  06/12/2011  . PACEMAKER INSERTION  09/28/08   SJM Zephyr XL DR  . perforated ulcer    . PPM GENERATOR CHANGEOUT N/A 10/21/2018   Procedure: PPM GENERATOR CHANGEOUT;  Surgeon: Thompson Grayer, MD;  Location: [place] [facility];  Service: Cardiovascular;  Laterality: N/A;  . right inguinal hernia repair  11/19/2002   with mesh  . TRANSURETHRAL RESECTION OF BLADDER TUMOR  09/06/2011   Procedure: TRANSURETHRAL RESECTION OF BLADDER TUMOR (TURBT);  Surgeon: Ailene Rud, MD;  [facility];  Service: Urology;  Laterality: N/A;  . TRANSURETHRAL RESECTION OF PROSTATE  16/96/7893  . umblical hernia  04/26/1750    Family History  Problem Relation Age of Onset  . Heart attack Mother 18       MI  . Heart failure Father   . Heart attack Brother        Late 69s  . Cancer Sister     Social History   Socioeconomic History  . Marital status: Married    Spouse name: Not on file  . Number of children: 0  . Years of education: 73  . Highest education level: Not on file  Occupational History  . Not on file  Tobacco Use  . Smoking status: Former Smoker    Quit date: 09/17/1958    Years since quitting: 62.1  . Smokeless tobacco: Never Used  . Tobacco comment: quit 1968  Substance and Sexual Activity  . Alcohol use: No  . Drug use: No  . Sexual activity: Not on file  Other Topics Concern  . Not on file  Social History Narrative   Lives with wife   Caffeine use: decaf only mostly   Tea very rarely   Right handed    Social Determinants of Health   Financial Resource Strain: Not on file  Food Insecurity: Not on file  Transportation Needs: Not on file  Physical Activity: Not on file  Stress: Not on file  Social Connections: Not on file  Intimate Partner Violence: Not on file     Physical Exam    Vitals:   10/18/20 0425 10/18/20 0529  BP: (!) 127/109 122/78  Pulse: 60 60  Resp: 20 (!) 24  Temp:  97.8 F (36.6 C)  SpO2: 98% 93%    CONSTITUTIONAL: Chronically ill-appearing, NAD NEURO:  Alert and oriented to name, moves all extremities EYES:  eyes equal and reactive ENT/NECK:  no LAD, no JVD CARDIO: Regular rate, well-perfused, normal S1 and S2 PULM:  CTAB no wheezing or rhonchi GI/GU:  normal bowel sounds, non-distended, non-tender MSK/SPINE:  No gross deformities, no edema SKIN:  no rash, atraumatic PSYCH:  Appropriate speech and behavior  *Additional and/or pertinent findings included in MDM below  Diagnostic and Interventional Summary    EKG Interpretation  Date/Time:  Nov 10, 2020 22:49:18 EST Ventricular Rate:  62 PR Interval:    QRS Duration: 150 QT Interval:  490 QTC Calculation: 497 R Axis:   -98 Text Interpretation: Ventricular-paced rhythm with intrinsic complexes Abnormal ECG Confirmed by Gerlene Fee [id number]) on 10/18/2020 5:36:18 AM      Labs Reviewed  BASIC METABOLIC PANEL - Abnormal; Notable for the following components:      Result Value   Glucose, Bld 106 (*)    Calcium 8.4 (*)    All other components within normal limits  TROPONIN I (HIGH SENSITIVITY) - Abnormal; Notable for the following components:   Troponin I (High Sensitivity) 18 (*)    All other components within normal limits  SARS CORONAVIRUS 2 BY RT PCR ([name] ORDER, [facility])  CBC  TROPONIN I (HIGH SENSITIVITY)    DG Chest Portable 1 View  Final Result      Medications  cefTRIAXone (ROCEPHIN) 2 g in sodium chloride 0.9 % 100 mL IVPB (has no administration in time range)  azithromycin (ZITHROMAX) 500 mg in sodium chloride 0.9 % 250 mL IVPB (has no administration in time range)  sodium chloride 0.9 % bolus 500 mL (has no administration in time range)     Procedures  /  Critical Care Procedures  ED Course and Medical Decision  Making  I have reviewed the triage vital signs, the nursing notes, and pertinent available records from the EMR.  Listed above are laboratory and imaging tests that I personally ordered, reviewed, and interpreted and then considered in my medical decision making (see below for details).  X-ray with suspicion for COVID-19 versus atypical pneumonia.  Covid test is negative.  Patient is not vaccinated.  New oxygen requirement, will admit to medicine.       Barth Kirks. [place]onia Small, [place]  [place] [email or web address]  Final Clinical Impressions(s) / ED Diagnoses     ICD-10-CM   1. Community acquired pneumonia, unspecified laterality  J18.9     ED Discharge Orders    None       Discharge Instructions Discussed with and Provided to Patient:   Discharge Instructions   None       Maudie Flakes, MD 10/18/20 [phone]

## 2020-10-18 NOTE — ED Notes (Signed)
Lunch Tray Ordered @ 1034. 

## 2020-10-18 NOTE — ED Notes (Signed)
Pt bladder scanned and has 461 mL in bladder.

## 2020-10-18 NOTE — ED Notes (Signed)
Blood culture labs ordered after antibiotics started

## 2020-10-18 NOTE — ED Notes (Signed)
Covid (-)/xray covid pna?? Breakfast order placed

## 2020-10-18 DEATH — deceased

## 2020-10-19 DIAGNOSIS — J189 Pneumonia, unspecified organism: Secondary | ICD-10-CM | POA: Diagnosis not present

## 2020-10-19 DIAGNOSIS — G9341 Metabolic encephalopathy: Secondary | ICD-10-CM | POA: Diagnosis not present

## 2020-10-19 DIAGNOSIS — G20 Parkinson's disease: Secondary | ICD-10-CM | POA: Diagnosis not present

## 2020-10-19 DIAGNOSIS — R531 Weakness: Secondary | ICD-10-CM | POA: Diagnosis not present

## 2020-10-19 LAB — CBC WITH DIFFERENTIAL/PLATELET
Abs Immature Granulocytes: 0.13 10*3/uL — ABNORMAL HIGH (ref 0.00–0.07)
Basophils Absolute: 0 10*3/uL (ref 0.0–0.1)
Basophils Relative: 0 %
Eosinophils Absolute: 0.1 10*3/uL (ref 0.0–0.5)
Eosinophils Relative: 1 %
HCT: 40.4 % (ref 39.0–52.0)
Hemoglobin: 13 g/dL (ref 13.0–17.0)
Immature Granulocytes: 1 %
Lymphocytes Relative: 7 %
Lymphs Abs: 0.9 10*3/uL (ref 0.7–4.0)
MCH: 30.9 pg (ref 26.0–34.0)
MCHC: 32.2 g/dL (ref 30.0–36.0)
MCV: 96 fL (ref 80.0–100.0)
Monocytes Absolute: 1.4 10*3/uL — ABNORMAL HIGH (ref 0.1–1.0)
Monocytes Relative: 12 %
Neutro Abs: 9.2 10*3/uL — ABNORMAL HIGH (ref 1.7–7.7)
Neutrophils Relative %: 79 %
Platelets: 197 10*3/uL (ref 150–400)
RBC: 4.21 MIL/uL — ABNORMAL LOW (ref 4.22–5.81)
RDW: 14.6 % (ref 11.5–15.5)
WBC: 11.6 10*3/uL — ABNORMAL HIGH (ref 4.0–10.5)
nRBC: 0 % (ref 0.0–0.2)

## 2020-10-19 LAB — BASIC METABOLIC PANEL
Anion gap: 11 (ref 5–15)
BUN: 14 mg/dL (ref 8–23)
CO2: 19 mmol/L — ABNORMAL LOW (ref 22–32)
Calcium: 8 mg/dL — ABNORMAL LOW (ref 8.9–10.3)
Chloride: 109 mmol/L (ref 98–111)
Creatinine, Ser: 0.74 mg/dL (ref 0.61–1.24)
GFR, Estimated: >60 mL/min (ref >60)
Glucose, Bld: 106 mg/dL — ABNORMAL HIGH (ref 70–99)
Potassium: 4 mmol/L (ref 3.5–5.1)
Sodium: 139 mmol/L (ref 135–145)

## 2020-10-19 LAB — LEGIONELLA PNEUMOPHILA SEROGP 1 UR AG: L. pneumophila Serogp 1 Ur Ag: NEGATIVE

## 2020-10-19 LAB — PHOSPHORUS: Phosphorus: 3.6 mg/dL (ref 2.5–4.6)

## 2020-10-19 LAB — MAGNESIUM: Magnesium: 1.9 mg/dL (ref 1.7–2.4)

## 2020-10-19 LAB — MYCOPLASMA PNEUMONIAE ANTIBODY, IGM: Mycoplasma pneumo IgM: <770 U/mL (ref 0–769)

## 2020-10-19 MED ORDER — DOXYCYCLINE HYCLATE 100 MG PO TABS
100.0000 mg | ORAL_TABLET | Freq: Two times a day (BID) | ORAL | Status: DC
  Administered 2020-10-20 (×2): 100 mg via ORAL
  Filled 2020-10-19 (×2): qty 1

## 2020-10-19 NOTE — Evaluation (Signed)
Physical Therapy Evaluation Patient Details Name: [name] MRN: [id number] DOB: Apr 15, 1931 Today's Date: 10/19/2020   History of Present Illness  Pt is an 87 y/o male admitted secondary to increased SOB. Found to have CAP. PMH includes a fib, parkinson's, CAD, and CHF.  Clinical Impression  Pt admitted secondary to problem above with deficits below. Pt requiring max A to perform bed mobility and transfers.  Pt's wife reports pt has declined over the last few weeks and has more difficulty ambulating and performing transfers. Educated about SNF recommendations. Pt's wife agreeable as long as he does not have to have the COVID vaccine. If pt's wife decides to take pt home will require DME below and max HH services. Will continue to follow acutely.     Follow Up Recommendations SNF;Supervision/Assistance - 24 hour    Equipment Recommendations  Hospital bed;Other (comment) (hoyer lift and pad)    Recommendations for Other Services OT consult     Precautions / Restrictions Precautions Precautions: Fall Restrictions Weight Bearing Restrictions: No      Mobility  Bed Mobility Overal bed mobility: Needs Assistance Bed Mobility: Supine to Sit;Sit to Supine     Supine to sit: Max assist Sit to supine: Max assist   General bed mobility comments: max A for trunk and LE assist. Increased time required.    Transfers Overall transfer level: Needs assistance Equipment used: None Transfers: Sit to/from Stand Sit to Stand: Max assist         General transfer comment: MAx A for lift assist and steadying. Pt with posterior lean and unable to achieve full upright.  Ambulation/Gait                Stairs            Wheelchair Mobility    Modified Rankin (Stroke Patients Only)       Balance Overall balance assessment: Needs assistance Sitting-balance support: No upper extremity supported;Feet supported Sitting balance-Leahy Scale: Fair     Standing balance  support: No upper extremity supported Standing balance-Leahy Scale: Zero Standing balance comment: max A to maintain balance                             Pertinent Vitals/Pain Pain Assessment: No/denies pain    Home Living Family/patient expects to be discharged to:: Private residence Living Arrangements: Spouse/significant other Available Help at Discharge: Family;Available 24 hours/day Type of Home: House Home Access: Ramped entrance     Home Layout: Two level;Able to live on main level with bedroom/bathroom Home Equipment: Transport chair;Wheelchair - [employer] - 2 wheels;Shower seat      Prior Function Level of Independence: Needs assistance   Gait / Transfers Assistance Needed: Used RW for short distances. Uses transport chair for longer distances.  ADL's / Homemaking Assistance Needed: Has not been able to get in the shower in 2 weeks. Wife has been assisting with sponge baths and dressing.        Hand Dominance        Extremity/Trunk Assessment   Upper Extremity Assessment Upper Extremity Assessment: Defer to OT evaluation    Lower Extremity Assessment Lower Extremity Assessment: Generalized weakness    Cervical / Trunk Assessment Cervical / Trunk Assessment: Kyphotic  Communication   Communication: HOH  Cognition Arousal/Alertness: Awake/alert Behavior During Therapy: WFL for tasks assessed/performed Overall Cognitive Status: History of cognitive impairments - at baseline  General Comments General comments (skin integrity, edema, etc.): Discussed SNF with pt's wife, however, pt's wife concerned that pt would have to get covid vaccine and she does not want him to.    Exercises     Assessment/Plan    PT Assessment Patient needs continued PT services  PT Problem List Decreased strength;Decreased balance;Decreased activity tolerance;Decreased mobility;Decreased cognition;Decreased  knowledge of use of DME;Decreased safety awareness;Decreased knowledge of precautions       PT Treatment Interventions DME instruction;Gait training;Functional mobility training;Therapeutic activities;Therapeutic exercise;Balance training;Patient/family education    PT Goals (Current goals can be found in the Care Plan section)  Acute Rehab PT Goals Patient Stated Goal: to go home PT Goal Formulation: With patient Time For Goal Achievement: 11/02/20 Potential to Achieve Goals: Fair    Frequency Min 2X/week   Barriers to discharge        Co-evaluation               AM-PAC PT "6 Clicks" Mobility  Outcome Measure Help needed turning from your back to your side while in a flat bed without using bedrails?: A Lot Help needed moving from lying on your back to sitting on the side of a flat bed without using bedrails?: A Lot Help needed moving to and from a bed to a chair (including a wheelchair)?: A Lot Help needed standing up from a chair using your arms (e.g., wheelchair or bedside chair)?: A Lot Help needed to walk in hospital room?: Total Help needed climbing 3-5 steps with a railing? : Total 6 Click Score: 10    End of Session Equipment Utilized During Treatment: Gait belt Activity Tolerance: Patient limited by fatigue Patient left: in bed;with call bell/phone within reach;with family/visitor present (on stretcher in ED) Nurse Communication: Mobility status PT Visit Diagnosis: Unsteadiness on feet (R26.81);Muscle weakness (generalized) (M62.81);Difficulty in walking, not elsewhere classified (R26.2)    Time: 2094-7096 PT Time Calculation (min) (ACUTE ONLY): 26 min   Charges:   PT Evaluation $PT Eval Moderate Complexity: 1 Mod PT Treatments $Therapeutic Activity: 8-22 mins        Lou Miner, DPT  Acute Rehabilitation Services  Pager: [phone] Office: [phone]   Rudean Hitt 10/19/2020, 12:46 PM

## 2020-10-19 NOTE — Plan of Care (Signed)
°  Problem: Respiratory: °Goal: Ability to maintain a clear airway will improve °Outcome: Progressing °  °

## 2020-10-19 NOTE — ED Notes (Signed)
Breakfast Ordered 

## 2020-10-19 NOTE — Progress Notes (Signed)
PROGRESS NOTE    [name]  BMW:[id number] DOB: July 07, 1931 DOA: 10/03/2020 PCP: [name], MD    Brief Narrative:   Patient is a 87 y.o.male who presents from homewith medical history significant forpersistent atrial fibrillation chronically anticoagulated on Eliquis and complicated by sick sinus syndrome status post pacemaker placement, Parkinson's disease.  Patient reported to the ED after a 4-5 day period of shortness of breath with a subjective report of fever, and new onset of non-productive cough. Per patient's wife, patient developed confusion relative to his usual baseline and also generalized weakness requiring more assistance than usual. Patient denies chills or myalgia.  Denies chest pain, palpitations, or diaphoresis.  No reported nausea, vomiting, diarrhea, or hemoptysis.  Patient denies any recent travel, COVID-19 exposure, or sick contacts. Patient has no preceding trauma or surgeries.  Patient has no known pulmonary disease, reports a brief smoking history in the 1960's, but quit after that and does not have baseline supplemental oxygen requirements.  ED Course:  On admission, vital signs were notable for the following: Tmax 98.5, heart rate 56-60; blood pressure121/43 - 124/78; respiratory rate 18-24; initial oxygen saturation noted to be in the mid 80s, which improved to 94 to 90% on 3 L nasal cannula.Patient is current on 4L with oxygen saturations, 90-91%.   Chest x-ray showed mixed patchy and coarse opacities in the bilateral lung suggestive of atypical pneumonia without evidence of edema, effusion, or pneumothorax. EKG showed ventricular paced rhythm with ventricular rate 62, without evidence of acute ischemic changes.  Patient was empirically treated for suspected atypical community-acquired pneumonia with broad spectrum antibiotics zithromycin 500 mg IV x1, Rocephin 2 g IV x1, and a 500 cc normal saline bolus.   Assessment & Plan:   Principal Problem:    Community acquired pneumonia Active Problems:   GERD   Permanent atrial fibrillation   Shortness of breath   Parkinson's disease ([place])   Acute metabolic encephalopathy   Generalized weakness   Pneumonia  Community-acquired pneumonia with hypoxia: Continue monitoring due to significant symptoms. Antibiotics to treat bacterial pneumonia, started on Rocephin and azithromycin.  Will change azithromycin to doxycycline. Chest physiotherapy, incentive spirometry, deep breathing exercises, sputum induction, mucolytic's and bronchodilators. Sputum cultures, blood cultures, pending. Legionella antigen pending. Streptococcal antigen negative. Supplemental oxygen to keep saturations more than 90%. Start mobilizing with PT OT. COVID-19 test was negative x2.  Respiratory virus panel pending.  Acute urinary retention: Patient has history of BPH and frequent urinary retention in the past especially associated with hospitalization and sedation. Foley catheter had to be placed on 2/1.  [name] willing to give voiding trial, will order voiding trial. Previously discharged on indwelling Foley catheter, if unable to urinate, will discharge with catheter. Urinalysis with no evidence of infection.  Parkinson's disease: Stable.  Continue carbidopa levodopa and amantadine.  Permanent A. fib and sick sinus syndrome status post pacemaker: Stable.  Rate controlled.  Therapeutic on Eliquis.  GERD: On PPI.  Physical debility and deconditioning: Work with PT OT.  Anticipate discharge to a skilled nursing facility.    DVT prophylaxis:  apixaban (ELIQUIS) tablet 5 mg   Code Status: DNR Family Communication: Wife at the bedside Disposition Plan: Status is: Inpatient  Remains inpatient appropriate because:Inpatient level of care appropriate due to severity of illness   Dispo:  Patient From: Home  Planned Disposition: Home with Health Care Svc versus SNF  Expected discharge date:  10/21/2020  Medically stable for discharge: No  Consultants:   None  Procedures:   None  Antimicrobials:  Antibiotics Given (last 72 hours)    Date/Time Action Medication Dose Rate   10/18/20 0604 New Bag/Given   cefTRIAXone (ROCEPHIN) 2 g in sodium chloride 0.9 % 100 mL IVPB 2 g 200 mL/hr   10/18/20 0655 New Bag/Given   azithromycin (ZITHROMAX) 500 mg in sodium chloride 0.9 % 250 mL IVPB 500 mg 250 mL/hr   10/19/20 [id number] New Bag/Given   cefTRIAXone (ROCEPHIN) 2 g in sodium chloride 0.9 % 100 mL IVPB 2 g 200 mL/hr   10/19/20 0900 New Bag/Given   azithromycin (ZITHROMAX) 500 mg in sodium chloride 0.9 % 250 mL IVPB 500 mg 250 mL/hr       Subjective: Patient seen and examined.  He stayed in the ER for last 24 hours or more.  Patient denies any complaints.  He is a poor historian with underlying cognitive problems.  Wife at the bedside.  She stayed with him all the time.  She was feeding him and giving him medications. Overnight he remained afebrile. He thinks his breathing is better than yesterday.  Wife noticed that he is with less labored breathing than yesterday. Foley catheter was placed because straight cath unable to pass.  He has some discomfort with Foley.  Objective: Vitals:   10/19/20 1200 10/19/20 1300 10/19/20 1400 10/19/20 1433  BP: 137/89 (!) 155/89 (!) 140/94 (!) 142/81  Pulse: (!) 53 (!) 50 (!) 54 60  Resp: (!) 22 (!) 24 (!) 24 20  Temp:    97.8 F (36.6 C)  TempSrc:      SpO2: 93% 94% 95% 100%    Intake/Output Summary (Last 24 hours) at 10/19/2020 1508 Last data filed at 10/19/2020 0810 Gross per 24 hour  Intake -  Output 250 ml  Net -250 ml   There were no vitals filed for this visit.  Examination:  General exam: Appears calm and comfortable  Chronically sick looking.  Debilitated.  On 4 L oxygen but looks fairly comfortable. Respiratory system: He has some crackles on the basis otherwise mostly clear. Cardiovascular system: S1 & S2  heard, RRR.  Pacemaker in place.  Nontender. Gastrointestinal system: Abdomen is nondistended, soft and nontender. No organomegaly or masses felt. Normal bowel sounds heard. Foley catheter with clear urine. Central nervous system: Alert and oriented x2.  He is slow to respond.  Wife does most of the talk. Extremities: Symmetric 5 x 5 power.  Generalized weakness.    Data Reviewed: I have personally reviewed following labs and imaging studies  CBC: Recent Labs  Lab 10/08/2020 2300 10/18/20 0809 10/19/20 0452  WBC 9.6  --  11.6*  NEUTROABS  --   --  9.2*  HGB 13.8 14.3 13.0  HCT 42.9 42.0 40.4  MCV 92.7  --  96.0  PLT 238  --  [id number]   Basic Metabolic Panel: Recent Labs  Lab 10/01/2020 2300 10/18/20 0656 10/18/20 0809 10/19/20 0452  NA 140  --  142 139  K 3.6  --  4.3 4.0  CL 105  --   --  109  CO2 22  --   --  19*  GLUCOSE 106*  --   --  106*  BUN 16  --   --  14  CREATININE 0.87  --   --  0.74  CALCIUM 8.4*  --   --  8.0*  MG  --  2.2  --  1.9  PHOS  --  4.1  --  3.6   GFR: CrCl cannot be calculated (Unknown ideal weight.). Liver Function Tests: No results for input(s): AST, ALT, ALKPHOS, BILITOT, PROT, ALBUMIN in the last 168 hours. No results for input(s): LIPASE, AMYLASE in the last 168 hours. No results for input(s): AMMONIA in the last 168 hours. Coagulation Profile: No results for input(s): INR, PROTIME in the last 168 hours. Cardiac Enzymes: No results for input(s): CKTOTAL, CKMB, CKMBINDEX, TROPONINI in the last 168 hours. BNP (last 3 results) No results for input(s): PROBNP in the last 8760 hours. HbA1C: No results for input(s): HGBA1C in the last 72 hours. CBG: No results for input(s): GLUCAP in the last 168 hours. Lipid Profile: No results for input(s): CHOL, HDL, LDLCALC, TRIG, CHOLHDL, LDLDIRECT in the last 72 hours. Thyroid Function Tests: Recent Labs    10/18/20 0656  TSH 1.197   Anemia Panel: No results for input(s): VITAMINB12, FOLATE,  FERRITIN, TIBC, IRON, RETICCTPCT in the last 72 hours. Sepsis Labs: Recent Labs  Lab 10/18/20 0656  PROCALCITON <0.10    Recent Results (from the past 240 hour(s))  SARS Coronavirus 2 by RT PCR (hospital order, performed in [facility] hospital lab) Nasopharyngeal Nasopharyngeal Swab     Status: None   Collection Time: 10/16/2020 11:03 PM   Specimen: Nasopharyngeal Swab  Result Value Ref Range Status   SARS Coronavirus 2 NEGATIVE NEGATIVE Final    Comment: (NOTE) SARS-CoV-2 target nucleic acids are NOT DETECTED.  The SARS-CoV-2 RNA is generally detectable in upper and lower respiratory specimens during the acute phase of infection. The lowest concentration of SARS-CoV-2 viral copies this assay can detect is 250 copies / mL. A negative result does not preclude SARS-CoV-2 infection and should not be used as the sole basis for treatment or other patient management decisions.  A negative result may occur with improper specimen collection / handling, submission of specimen other than nasopharyngeal swab, presence of viral mutation(s) within the areas targeted by this assay, and inadequate number of viral copies (<250 copies / mL). A negative result must be combined with clinical observations, patient history, and epidemiological information.  Fact Sheet for Patients:   StrictlyIdeas.no  Fact Sheet for Healthcare Providers: BankingDealers.co.za  This test is not yet approved or  cleared by the [place] FDA and has been authorized for detection and/or diagnosis of SARS-CoV-2 by FDA under an Emergency Use Authorization (EUA).  This EUA will remain in effect (meaning this test can be used) for the duration of the COVID-19 declaration under Section 564(b)(1) of the Act, 21 U.S.C. section 360bbb-3(b)(1), unless the authorization is terminated or revoked sooner.  Performed at [facility], [place] [address], [place],  [place] [address]   Culture, blood (Routine X 2) w Reflex to ID Panel     Status: None (Preliminary result)   Collection Time: 10/18/20  6:22 AM   Specimen: BLOOD RIGHT FOREARM  Result Value Ref Range Status   Specimen Description BLOOD RIGHT FOREARM  Final   Special Requests   Final    BOTTLES DRAWN AEROBIC AND ANAEROBIC Blood Culture results may not be optimal due to an inadequate volume of blood received in culture bottles   Culture   Final    NO GROWTH 1 DAY Performed at [facility], [place] [address] [place], [place] [address]    Report Status PENDING  Incomplete  Culture, blood (Routine X 2) w Reflex to ID Panel     Status: None (Preliminary result)  Collection Time: 10/18/20  1:52 PM   Specimen: BLOOD  Result Value Ref Range Status   Specimen Description BLOOD SITE NOT SPECIFIED  Final   Special Requests   Final    BOTTLES DRAWN AEROBIC AND ANAEROBIC Blood Culture results may not be optimal due to an inadequate volume of blood received in culture bottles   Culture   Final    NO GROWTH < 24 HOURS Performed at [facility], [address], [place], [place] [address]    Report Status PENDING  Incomplete         Radiology Studies: DG Chest Portable 1 View  Result Date: 09/30/2020 CLINICAL DATA:  Shortness of breath, hypoxic EXAM: PORTABLE CHEST 1 VIEW COMPARISON:  Radiograph 06/11/2019 FINDINGS: Mixed areas of coarse heterogeneous and patchy opacity present in the lungs in a mid to lower lung and peripheral predominance. Additional coalescent opacity noted along right heart border. No pneumothorax. Trace left effusion may be present. No right effusion is seen. Enlarged cardiac silhouette is similar to priors. Pacer pack overlies the left chest wall with leads at the right atrium and cardiac apex. Surgical clips noted at the diaphragmatic hiatus in the right upper quadrant. IMPRESSION: 1. Mixed patchy and coarse opacities in the lungs with a mid to lower lung and peripheral  predominance, with more coalescent opacity along the right heart border. Could reflect an acute pneumonia including atypical viral etiologies such as COVID 19. Given the somewhat masslike appearance of the right hilar opacity, recommend follow-up imaging to resolution. Electronically Signed   By: Lovena Le M.D.   On: 09/29/2020 23:15        Scheduled Meds: . amantadine  100 mg Oral BID  . apixaban  5 mg Oral BID  . carbidopa-levodopa  1 tablet Oral QID  . [START ON 10/20/2020] doxycycline  100 mg Oral Q12H  . famotidine  20 mg Oral Daily  . memantine  5 mg Oral BID  . vitamin B-12  1,000 mcg Oral Daily   Continuous Infusions: . cefTRIAXone (ROCEPHIN)  IV Stopped (10/19/20 0858)     LOS: 1 day    Time spent: 35 minutes    Barb Merino, MD [facility] Pager [phone]

## 2020-10-19 NOTE — Progress Notes (Incomplete)
PROGRESS NOTE    [name]  [id number] DOB: 1931/07/28 DOA: 10/14/2020 PCP: [name], MD   Chief complaint:  Shortness of breath  Brief Narrative:   Patient is a 87 y.o. male who presents from home with medical history significant for persistent atrial fibrillation chronically anticoagulated on Eliquis and complicated by sick sinus syndrome status post pacemaker placement, Parkinson's disease.  Patient reported to the ED after a 4-5 day period of shortness of breath with a subjective report of fever, and new onset of non-productive cough. Per patient's wife, patient developed confusion relative to his usual baseline and also generalized weakness requiring more assistance than usual. Patient denies chills or myalgia.  Denies chest pain, palpitations, or diaphoresis.  No reported nausea, vomiting, diarrhea, or hemoptysis.  Patient denies any recent travel, COVID-19 exposure, or sick contacts. Patient has no preceding trauma or surgeries.  Patient has no known pulmonary disease, reports a brief smoking history in the 1960's, but quit after that and does not have baseline supplemental oxygen requirements.  ED Course:  On admission, vital signs were notable for the following: Tmax 98.5, heart rate 56-60; blood pressure 121/43 - 124/78; respiratory rate 18-24; initial oxygen saturation noted to be in the mid 80s, which improved to 94 to 90% on 3 L nasal cannula. Patient is current on 4L with oxygen saturations, 90-91%.   Chest x-ray showed mixed patchy and coarse opacities in the bilateral lung suggestive of atypical pneumonia without evidence of edema, effusion, or pneumothorax.  EKG showed ventricular paced rhythm with ventricular rate 62, without evidence of acute ischemic changes.  Patient was empirically treated for suspected atypical community-acquired pneumonia with broad spectrum antibiotics zithromycin 500 mg IV x1, Rocephin 2 g IV x1, and a 500 cc normal saline bolus.      Assessment & Plan:   Principal Problem:   Community acquired pneumonia Active Problems:   GERD   Permanent atrial fibrillation   Shortness of breath   Parkinson's disease ([place])   Acute metabolic encephalopathy   Generalized weakness   Pneumonia   Pneumonia, suspected community acquired, with associated acute hypoxic respiratory distress, and generalized weakness -Patient diagnosed based on history of 4-5 day period of shortness of breath with a subjective report of fever, new onset of non-productive cough, chest X-ray showed mixed patchy and coarse opacities in the lungs with a mid to lower lung and peripheral predominance, which could reflect an acute pneumonia including atypical viral etiologies such as COVID 19. -ABG on 10/18/20 showed hypoxemia with pO2 88. -COVID negative, patient afebrile -WBC's 9.6->11.6, procalcitonin <0.10, TSH 1.197, WNL -Patient reports no supplemental oxygen needs at home and is now requiring 3-4 L [place] to maintain oxygen saturations of 90-91 %. -Goal to maintain oxygen saturations above 90 %. -Chest X-ray shows enlarged cardiac silhouette, comparable to prior exams but no other cardiac findings.  -EKG shows v paced rhythm without acute ischemic changes.  -Pending blood cultures x2, sputum cultures, and urine legionella and streptococcal antigen -Initially started on ceftriaxone and azithromycin, transitioned to ceftriaxone and doxycycline 100 mg, q12 hrs -Incentive and flutter valve. -Monitor on continuous pulse oximetry. -PT evaluation, recommend SNF at discharge.  Parkinson's Disease -Chronic,stable. Continue home medication carbidopa-levodopa and amantadine  Permanent atrial fibrillation -Chronically anticoagulated on Apixaban 5 mg BID  Sick-sinus syndrome, chronic, stable -Permanent pacemaker, [employer]   GERD, chronic, stable -Continue home PPI   Note Details  Merlene Pulling, MD File Time 10/18/2020 2:03 PM  Author Type Physician  Status Signed  Last Editor Barb Merino, [place] # [id number] Admit Date 09/26/2020     DVT prophylaxis: SCD's Code Status: DNR Family Communication:  Wife at bedside  Status is: Inpatient  Dispo:  Patient From: Home  Planned Disposition: Home with Health Care Svc  Expected discharge date: 10/21/2020  Medically stable for discharge: No   There is no height or weight on file to calculate BMI.   Consultants:   None  Procedures:   None  Antimicrobials:   Doxycycline   Subjective:  Patient seen at bedside in the ED.  Not in acute distress but shows visible labored breathing.   Review of Systems Otherwise negative except as per HPI, including: General: Denies fever, chills, night sweats or unintended weight loss. Resp: Denies cough, wheezing, endorses shortness of breath. Cardiac: Denies chest pain, palpitations, orthopnea, paroxysmal nocturnal dyspnea. GI: Denies abdominal pain, nausea, vomiting, diarrhea or constipation GU: Denies dysuria, frequency, hesitancy or incontinence MS: Denies muscle aches, joint pain or swelling Neuro: Denies headache, neurologic deficits (focal weakness, numbness, tingling), abnormal gait Psych: Denies anxiety, depression, SI/HI/AVH Skin: Denies new rashes or lesions ID: Denies sick contacts, exotic exposures, travel  Examination:  General exam: Appears calm and comfortable  Respiratory system:  Upper lungs clear to auscultation. Crackles auscultated in right lower bases. Respiratory effort labored. Cardiovascular system: S1 & S2 heard, RRR. No JVD, murmurs, rubs, gallops or clicks. No pedal edema. Gastrointestinal system: Abdomen is nondistended, soft and nontender. No organomegaly or masses felt. Normal bowel sounds heard. Central nervous system: Alert and oriented. No focal neurological deficits. Extremities: Symmetric 5 x 5 power. Skin: No rashes, lesions or ulcers Psychiatry: Judgement and insight  appear normal. Mood & affect appropriate.     Objective: Vitals:   10/19/20 0345 10/19/20 0735 10/19/20 0815 10/19/20 1000  BP: 135/87 125/85  140/90  Pulse: (!) 58 60  (!) 59  Resp: (!) 21 (!) 23  (!) 28  Temp:   (!) 97.3 F (36.3 C)   TempSrc:   Oral   SpO2: 96% 96%  99%    Intake/Output Summary (Last 24 hours) at 10/19/2020 1116 Last data filed at 10/19/2020 0810 Gross per 24 hour  Intake -  Output 250 ml  Net -250 ml   There were no vitals filed for this visit.   Data Reviewed:   CBC: Recent Labs  Lab 09/19/2020 2300 10/18/20 0809 10/19/20 0452  WBC 9.6  --  11.6*  NEUTROABS  --   --  9.2*  HGB 13.8 14.3 13.0  HCT 42.9 42.0 40.4  MCV 92.7  --  96.0  PLT 238  --  101   Basic Metabolic Panel: Recent Labs  Lab 10/05/2020 2300 10/18/20 0656 10/18/20 0809 10/19/20 0452  NA 140  --  142 139  K 3.6  --  4.3 4.0  CL 105  --   --  109  CO2 22  --   --  19*  GLUCOSE 106*  --   --  106*  BUN 16  --   --  14  CREATININE 0.87  --   --  0.74  CALCIUM 8.4*  --   --  8.0*  MG  --  2.2  --  1.9  PHOS  --  4.1  --  3.6   GFR: CrCl cannot be calculated (Unknown ideal weight.). Liver Function Tests: No results for input(s): AST, ALT, ALKPHOS, BILITOT, PROT, ALBUMIN in the last 168 hours.  No results for input(s): LIPASE, AMYLASE in the last 168 hours. No results for input(s): AMMONIA in the last 168 hours. Coagulation Profile: No results for input(s): INR, PROTIME in the last 168 hours. Cardiac Enzymes: No results for input(s): CKTOTAL, CKMB, CKMBINDEX, TROPONINI in the last 168 hours. BNP (last 3 results) No results for input(s): PROBNP in the last 8760 hours. HbA1C: No results for input(s): HGBA1C in the last 72 hours. CBG: No results for input(s): GLUCAP in the last 168 hours. Lipid Profile: No results for input(s): CHOL, HDL, LDLCALC, TRIG, CHOLHDL, LDLDIRECT in the last 72 hours. Thyroid Function Tests: Recent Labs    10/18/20 0656  TSH 1.197   Anemia  Panel: No results for input(s): VITAMINB12, FOLATE, FERRITIN, TIBC, IRON, RETICCTPCT in the last 72 hours. Sepsis Labs: Recent Labs  Lab 10/18/20 0656  PROCALCITON <0.10    Recent Results (from the past 240 hour(s))  SARS Coronavirus 2 by RT PCR (hospital order, performed in [facility] hospital lab) Nasopharyngeal Nasopharyngeal Swab     Status: None   Collection Time: 10/02/2020 11:03 PM   Specimen: Nasopharyngeal Swab  Result Value Ref Range Status   SARS Coronavirus 2 NEGATIVE NEGATIVE Final    Comment: (NOTE) SARS-CoV-2 target nucleic acids are NOT DETECTED.  The SARS-CoV-2 RNA is generally detectable in upper and lower respiratory specimens during the acute phase of infection. The lowest concentration of SARS-CoV-2 viral copies this assay can detect is 250 copies / mL. A negative result does not preclude SARS-CoV-2 infection and should not be used as the sole basis for treatment or other patient management decisions.  A negative result may occur with improper specimen collection / handling, submission of specimen other than nasopharyngeal swab, presence of viral mutation(s) within the areas targeted by this assay, and inadequate number of viral copies (<250 copies / mL). A negative result must be combined with clinical observations, patient history, and epidemiological information.  Fact Sheet for Patients:   StrictlyIdeas.no  Fact Sheet for Healthcare Providers: BankingDealers.co.za  This test is not yet approved or  cleared by the [place] FDA and has been authorized for detection and/or diagnosis of SARS-CoV-2 by FDA under an Emergency Use Authorization (EUA).  This EUA will remain in effect (meaning this test can be used) for the duration of the COVID-19 declaration under Section 564(b)(1) of the Act, 21 U.S.C. section 360bbb-3(b)(1), unless the authorization is terminated or revoked sooner.  Performed at [facility], [place] [address] [place], [place] [address]   Culture, blood (Routine X 2) w Reflex to ID Panel     Status: None (Preliminary result)   Collection Time: 10/18/20  6:22 AM   Specimen: BLOOD RIGHT FOREARM  Result Value Ref Range Status   Specimen Description BLOOD RIGHT FOREARM  Final   Special Requests   Final    BOTTLES DRAWN AEROBIC AND ANAEROBIC Blood Culture results may not be optimal due to an inadequate volume of blood received in culture bottles   Culture   Final    NO GROWTH 1 DAY Performed at [facility], [place] [address], [place], [place] [address]    Report Status PENDING  Incomplete  Culture, blood (Routine X 2) w Reflex to ID Panel     Status: None (Preliminary result)   Collection Time: 10/18/20  1:52 PM   Specimen: BLOOD  Result Value Ref Range Status   Specimen Description BLOOD SITE NOT SPECIFIED  Final   Special Requests  Final    BOTTLES DRAWN AEROBIC AND ANAEROBIC Blood Culture results may not be optimal due to an inadequate volume of blood received in culture bottles   Culture   Final    NO GROWTH < 24 HOURS Performed at [facility] [address] [place], [place] [address]    Report Status PENDING  Incomplete         Radiology Studies: DG Chest Portable 1 View  Result Date: 09/26/2020 CLINICAL DATA:  Shortness of breath, hypoxic EXAM: PORTABLE CHEST 1 VIEW COMPARISON:  Radiograph 06/11/2019 FINDINGS: Mixed areas of coarse heterogeneous and patchy opacity present in the lungs in a mid to lower lung and peripheral predominance. Additional coalescent opacity noted along right heart border. No pneumothorax. Trace left effusion may be present. No right effusion is seen. Enlarged cardiac silhouette is similar to priors. Pacer pack overlies the left chest wall with leads at the right atrium and cardiac apex. Surgical clips noted at the diaphragmatic hiatus in the right upper quadrant. IMPRESSION: 1. Mixed patchy and coarse opacities in the  lungs with a mid to lower lung and peripheral predominance, with more coalescent opacity along the right heart border. Could reflect an acute pneumonia including atypical viral etiologies such as COVID 19. Given the somewhat masslike appearance of the right hilar opacity, recommend follow-up imaging to resolution. Electronically Signed   By: Lovena Le M.D.   On: 09/27/2020 23:15        Scheduled Meds: . amantadine  100 mg Oral BID  . apixaban  5 mg Oral BID  . carbidopa-levodopa  1 tablet Oral QID  . [START ON 10/20/2020] doxycycline  100 mg Oral Q12H  . famotidine  20 mg Oral Daily  . memantine  5 mg Oral BID  . vitamin B-12  1,000 mcg Oral Daily   Continuous Infusions: . cefTRIAXone (ROCEPHIN)  IV Stopped (10/19/20 0858)     LOS: 1 day   Time spent= 25 mins    Dede Query, RN NP-Student   If 7PM-7AM, please contact night-coverage  10/19/2020, 11:16 AM

## 2020-10-19 NOTE — ED Notes (Signed)
Pt departed ED for floor

## 2020-10-20 ENCOUNTER — Inpatient Hospital Stay (HOSPITAL_COMMUNITY): Payer: Medicare Other

## 2020-10-20 DIAGNOSIS — G9341 Metabolic encephalopathy: Secondary | ICD-10-CM | POA: Diagnosis not present

## 2020-10-20 DIAGNOSIS — J189 Pneumonia, unspecified organism: Secondary | ICD-10-CM | POA: Diagnosis not present

## 2020-10-20 DIAGNOSIS — G20 Parkinson's disease: Secondary | ICD-10-CM | POA: Diagnosis not present

## 2020-10-20 DIAGNOSIS — R531 Weakness: Secondary | ICD-10-CM | POA: Diagnosis not present

## 2020-10-20 LAB — URINE CULTURE: Culture: NO GROWTH

## 2020-10-20 NOTE — Evaluation (Signed)
Occupational Therapy Evaluation Patient Details Name: [name] MRN: [id number] DOB: [birth year] Today's Date: 10/20/2020    History of Present Illness Pt is an 87 y/o male admitted secondary to increased SOB. Found to have CAP. PMH includes a fib, parkinson's, CAD, and CHF.   Clinical Impression   PTA, pt lives with wife and typically ambulatory with Rollator. Wife has been assisting with ADLs recently due to functional decline. Pt does not wear O2 at baseline. Pt presents with deficits in cardiopulmonary tolerance, strength,balance, and cognition. Pt overall Mod A for bed mobility, Min A for sit to stand and Mod A x 2 for pivot to chair using RW. Pt requires Mod A for UB ADLs and Total A for LB ADLs. Pt's wife present and supportive. Recommend SNF for short term rehab though noted pt wants to be able to take pt home.  Pt received on 2 L O2, 89% at rest. Desats to 83% sitting EOB on 2 L O2, increased to 3 L O2 at 90%. After transfer on 3 L O2, SpO2 77% with > 5 minutes needed to recover as well as increase to 4 L O2. Pt with difficulty following pursed lip breathing. Pt left on 4 L O2, SpO2 at 87% - RN aware.    Follow Up Recommendations  SNF;Supervision/Assistance - 24 hour    Equipment Recommendations  Other (comment) (defer to next venue)    Recommendations for Other Services       Precautions / Restrictions Precautions Precautions: Fall Precaution Comments: watch O2 Restrictions Weight Bearing Restrictions: No      Mobility Bed Mobility Overal bed mobility: Needs Assistance Bed Mobility: Supine to Sit     Supine to sit: HOB elevated;Mod assist     General bed mobility comments: assist to advance legs with cues for sequencing, able to demo lifiting trunk without assist to long sitting    Transfers Overall transfer level: Needs assistance Equipment used: Rolling walker (2 wheeled) Transfers: Sit to/from [facility] Sit to Stand: Min assist Stand  pivot transfers: Mod assist;+2 physical assistance;+2 safety/equipment       General transfer comment: Min A for sit to stand at bedside, cues for hand placement. Mod A x 2 for safety in pivoting to chair, assistance to maneuver RW    Balance Overall balance assessment: Needs assistance Sitting-balance support: No upper extremity supported;Feet supported Sitting balance-Leahy Scale: Fair     Standing balance support: Bilateral upper extremity supported;During functional activity Standing balance-Leahy Scale: Poor Standing balance comment: reliant on external and UE support                           ADL either performed or assessed with clinical judgement   ADL Overall ADL's : Needs assistance/impaired Eating/Feeding: Set up;Sitting   Grooming: Set up;Sitting   Upper Body Bathing: Moderate assistance;Sitting   Lower Body Bathing: Maximal assistance;Sit to/from stand;Sitting/lateral leans   Upper Body Dressing : Moderate assistance;Sitting   Lower Body Dressing: Total assistance;Sit to/from stand;Sitting/lateral leans Lower Body Dressing Details (indicate cue type and reason): Total A to to don socks Toilet Transfer: Moderate assistance;Stand-pivot;RW Toilet Transfer Details (indicate cue type and reason): simulated to recliner Toileting- Clothing Manipulation and Hygiene: Total assistance;Sit to/from stand         General ADL Comments: Limited by decreased cardiopulmonary tolerance, weakness, and balance deficits     Vision Patient Visual Report: No change from baseline Vision Assessment?: No apparent visual deficits  Perception     Praxis      Pertinent Vitals/Pain       Hand Dominance Right   Extremity/Trunk Assessment Upper Extremity Assessment Upper Extremity Assessment: Generalized weakness   Lower Extremity Assessment Lower Extremity Assessment: Defer to PT evaluation   Cervical / Trunk Assessment Cervical / Trunk Assessment:  Kyphotic   Communication Communication Communication: HOH   Cognition Arousal/Alertness: Awake/alert Behavior During Therapy: WFL for tasks assessed/performed Overall Cognitive Status: History of cognitive impairments - at baseline                                 General Comments: hx of dementia, A&Ox2 (able to report at hospital but not name of hospital). Follows directions with increased time, impulsive and fidegty with lines at times   General Comments  Pt received on 2 L O2, 89% at rest. 83% sitting EOB, increased to 3 L O2 with improvement to 90% quickly. After transfer, O2 at 77% on 3 L O2. > 5 minutes (extensive cues for pursed lip breathing) and increased to 4 L O2 - SpO2 87%. RN aware. Wife present and support    Exercises     Shoulder Instructions      Home Living Family/patient expects to be discharged to:: Private residence Living Arrangements: Spouse/significant other Available Help at Discharge: Family;Available 24 hours/day Type of Home: House Home Access: Ramped entrance     Home Layout: Two level;Able to live on main level with bedroom/bathroom     Bathroom Shower/Tub: Chief Strategy Officer: Transport chair;Wheelchair - [employer] - 2 wheels;Shower seat          Prior Functioning/Environment Level of Independence: Needs assistance  Gait / Transfers Assistance Needed: Used RW for short distances. Uses transport chair for longer distances. ADL's / Homemaking Assistance Needed: Has not been able to get in the shower in 2 weeks. Wife has been assisting with sponge baths and dressing. Prior to 1 year ago, was independent in all ADLs            OT Problem List: Decreased strength;Decreased activity tolerance;Impaired balance (sitting and/or standing);Decreased cognition;Decreased safety awareness;Decreased knowledge of use of DME or AE;Cardiopulmonary status limiting activity      OT Treatment/Interventions:  Self-care/ADL training;Therapeutic exercise;Energy conservation;DME and/or AE instruction;Therapeutic activities;Patient/family education;Balance training    OT Goals(Current goals can be found in the care plan section) Acute Rehab OT Goals Patient Stated Goal: to go home OT Goal Formulation: With patient/family Time For Goal Achievement: 11/03/20 Potential to Achieve Goals: Good ADL Goals Pt Will Perform Grooming: with modified independence;standing Pt Will Perform Lower Body Bathing: with min assist;sitting/lateral leans;sit to/from stand Pt Will Transfer to Toilet: with min guard assist;ambulating Pt Will Perform Toileting - Clothing Manipulation and hygiene: with min assist;sitting/lateral leans;sit to/from stand Additional ADL Goal #1: Pt to demonstrate pursed lip breathing with min verbal cues to maintain O2 > 88% Additional ADL Goal #2: Pt to increase standing tolerance > 5 minutes during ADLs to improve overall endurance  OT Frequency: Min 2X/week   Barriers to D/C:            Co-evaluation              AM-PAC OT "6 Clicks" Daily Activity     Outcome Measure Help from another person eating meals?: A Little Help from another person taking care of personal grooming?: A Little  Help from another person toileting, which includes using toliet, bedpan, or urinal?: Total Help from another person bathing (including washing, rinsing, drying)?: A Lot Help from another person to put on and taking off regular upper body clothing?: A Lot Help from another person to put on and taking off regular lower body clothing?: Total 6 Click Score: 12   End of Session Equipment Utilized During Treatment: Gait belt;Rolling walker;Oxygen Nurse Communication: Mobility status;Other (comment) (O2)  Activity Tolerance: Patient tolerated treatment well Patient left: in chair;with call bell/phone within reach;with chair alarm set;with family/visitor present  OT Visit Diagnosis: Unsteadiness on feet  (R26.81);Other abnormalities of gait and mobility (R26.89);Muscle weakness (generalized) (M62.81);Other (comment) (decreased cardiopulmonary tolerance)                Time: 4193-7902 OT Time Calculation (min): 35 min Charges:  OT General Charges $OT Visit: 1 Visit OT Evaluation $OT Eval Moderate Complexity: 1 Mod OT Treatments $Therapeutic Activity: 8-22 mins  Lorre Munroe, OTR/L  Lorre Munroe 10/20/2020, 11:25 AM

## 2020-10-20 NOTE — Plan of Care (Signed)
  Problem: Activity: Goal: Ability to tolerate increased activity will improve Outcome: Progressing   Problem: Clinical Measurements: Goal: Ability to maintain a body temperature in the normal range will improve Outcome: Progressing   Problem: Respiratory: Goal: Ability to maintain adequate ventilation will improve Outcome: Progressing Goal: Ability to maintain a clear airway will improve Outcome: Progressing   

## 2020-10-20 NOTE — Progress Notes (Signed)
PROGRESS NOTE    [name]  [id number] DOB: Dec 21, 1930 DOA: 10/16/2020 PCP: [name], MD    Brief Narrative:  Patient is a 87 y.o.male who presents from homewith medical history significant forpersistent atrial fibrillation chronically anticoagulated on Eliquis and complicated by sick sinus syndrome status post pacemaker placement, Parkinson's disease.  Patient reported to the ED after a 4-5 day period of shortness of breath with a subjective report of fever, and new onset of non-productive cough. Per patient's wife, patient developed confusion relative to his usual baseline and also generalized weakness requiring more assistance than usual. Patient denies chills or myalgia.  Denies chest pain, palpitations, or diaphoresis.  No reported nausea, vomiting, diarrhea, or hemoptysis.  Patient denies any recent travel, COVID-19 exposure, or sick contacts. Patient has no preceding trauma or surgeries.  Patient has no known pulmonary disease, reports a brief smoking history in the 1960's, but quit after that and does not have baseline supplemental oxygen requirements.  ED Course:  On admission, vital signs were notable for the following: Tmax 98.5, heart rate 56-60; blood pressure121/43 - 124/78; respiratory rate 18-24; initial oxygen saturation noted to be in the mid 80s, which improved to 94 to 90% on 3 L nasal cannula.Patient is current on 4L with oxygen saturations, 90-91%.   Chest x-ray showed mixed patchy and coarse opacities in the bilateral lung suggestive of atypical pneumonia without evidence of edema, effusion, or pneumothorax. EKG showed ventricular paced rhythm with ventricular rate 62, without evidence of acute ischemic changes.  Patient was empirically treated for suspected atypical community-acquired pneumonia with broad spectrum antibiotics zithromycin 500 mg IV x1, Rocephin 2 g IV x1, and a 500 cc normal saline bolus.   Assessment & Plan:   Principal Problem:    Community acquired pneumonia Active Problems:   GERD   Permanent atrial fibrillation   Shortness of breath   Parkinson's disease ([place])   Acute metabolic encephalopathy   Generalized weakness   Pneumonia  Community-acquired pneumonia with hypoxia: Continue monitoring due to significant symptoms. Antibiotics to treat bacterial pneumonia, currently on Rocephin and doxycycline.  Will treat with 7 days of therapy.   Chest physiotherapy, incentive spirometry, deep breathing exercises, sputum induction, mucolytic's and bronchodilators. Sputum cultures, blood cultures, negative so far. Legionella antigen pending. Streptococcal antigen negative. Supplemental oxygen to keep saturations more than 90%. Start mobilizing with PT OT. COVID-19 test was negative x2.  Respiratory virus panel pending. Repeat chest x-ray today with more organized consolidation.  Patient clinically improving. Keep on oxygen to keep saturation more than 90%.  Acute urinary retention: Patient has history of BPH and frequent urinary retention in the past especially associated with hospitalization and sedation. Foley catheter had to be placed on 2/1.  Successful voiding trial 2/2.  Will monitor. Urinalysis with no evidence of infection.  Parkinson's disease: Stable.  Continue carbidopa levodopa and amantadine.  Permanent A. fib and sick sinus syndrome status post pacemaker: Stable.  Rate controlled.  Therapeutic on Eliquis.  GERD: On PPI.  Physical debility and deconditioning: Work with PT OT.  Anticipate discharge to a skilled nursing facility. [name] wants to take him home as she is able to have adequate support at home with her family member. Patient is unvaccinated and she is worried about him going to other facilities. Husband and wife declined COVID-41 vaccination.    DVT prophylaxis:  apixaban (ELIQUIS) tablet 5 mg   Code Status: DNR Family Communication: Wife at the bedside Disposition Plan:  Status is:  Inpatient  Remains inpatient appropriate because:Inpatient level of care appropriate due to severity of illness   Dispo:  Patient From: Home  Planned Disposition: Home with Health Care Svc versus SNF  Expected discharge date: 10/21/2020  Medically stable for discharge: No           Consultants:   None  Procedures:   None  Antimicrobials:  Antibiotics Given (last 72 hours)    Date/Time Action Medication Dose Rate   10/18/20 0604 New Bag/Given   cefTRIAXone (ROCEPHIN) 2 g in sodium chloride 0.9 % 100 mL IVPB 2 g 200 mL/hr   10/18/20 0655 New Bag/Given   azithromycin (ZITHROMAX) 500 mg in sodium chloride 0.9 % 250 mL IVPB 500 mg 250 mL/hr   10/19/20 [id number] New Bag/Given   cefTRIAXone (ROCEPHIN) 2 g in sodium chloride 0.9 % 100 mL IVPB 2 g 200 mL/hr   10/19/20 0900 New Bag/Given   azithromycin (ZITHROMAX) 500 mg in sodium chloride 0.9 % 250 mL IVPB 500 mg 250 mL/hr   10/20/20 0915 New Bag/Given   cefTRIAXone (ROCEPHIN) 2 g in sodium chloride 0.9 % 100 mL IVPB 2 g 200 mL/hr   10/20/20 [id number] Given   doxycycline (VIBRA-TABS) tablet 100 mg 100 mg        Subjective: Patient seen and examined.  Sitting in couch.  Looks very comfortable.  Wife at the bedside.  Afebrile overnight.  Patient himself is poor historian but denies any complaints.  Objective: Vitals:   10/19/20 1433 10/19/20 2122 10/20/20 0436 10/20/20 0500  BP: (!) 142/81 (!) 150/84 109/67   Pulse: 60 (!) 57 60   Resp: 20 18 20    Temp: 97.8 F (36.6 C) 98.1 F (36.7 C) 97.7 F (36.5 C)   TempSrc:  Oral Oral   SpO2: 100% 91% 97%   Weight:    72.7 kg  Height:        Intake/Output Summary (Last 24 hours) at 10/20/2020 1055 Last data filed at 10/19/2020 2000 Gross per 24 hour  Intake 100.45 ml  Output --  Net 100.45 ml   Filed Weights   10/19/20 1415 10/20/20 0500  Weight: 70.1 kg 72.7 kg    Examination:  General exam: Appears calm and comfortable  Chronically sick looking.   Debilitated. SpO2: 97 % O2 Flow Rate (L/min): 4 L/min  Respiratory system: He has some crackles on the basis otherwise mostly clear. Cardiovascular system: S1 & S2 heard, RRR.  Pacemaker in place.  Nontender. Gastrointestinal system: Abdomen is nondistended, soft and nontender. No organomegaly or masses felt. Normal bowel sounds heard. Foley catheter with clear urine. Central nervous system: Alert and oriented x2.  He is slow to respond.   Extremities: Symmetric 5 x 5 power.  Generalized weakness.    Data Reviewed: I have personally reviewed following labs and imaging studies  CBC: Recent Labs  Lab 09/27/2020 2300 10/18/20 0809 10/19/20 0452  WBC 9.6  --  11.6*  NEUTROABS  --   --  9.2*  HGB 13.8 14.3 13.0  HCT 42.9 42.0 40.4  MCV 92.7  --  96.0  PLT 238  --  [id number]   Basic Metabolic Panel: Recent Labs  Lab 09/18/2020 2300 10/18/20 0656 10/18/20 0809 10/19/20 0452  NA 140  --  142 139  K 3.6  --  4.3 4.0  CL 105  --   --  109  CO2 22  --   --  19*  GLUCOSE 106*  --   --  106*  BUN 16  --   --  14  CREATININE 0.87  --   --  0.74  CALCIUM 8.4*  --   --  8.0*  MG  --  2.2  --  1.9  PHOS  --  4.1  --  3.6   GFR: Estimated Creatinine Clearance: 66.9 mL/min (by C-G formula based on SCr of 0.74 mg/dL). Liver Function Tests: No results for input(s): AST, ALT, ALKPHOS, BILITOT, PROT, ALBUMIN in the last 168 hours. No results for input(s): LIPASE, AMYLASE in the last 168 hours. No results for input(s): AMMONIA in the last 168 hours. Coagulation Profile: No results for input(s): INR, PROTIME in the last 168 hours. Cardiac Enzymes: No results for input(s): CKTOTAL, CKMB, CKMBINDEX, TROPONINI in the last 168 hours. BNP (last 3 results) No results for input(s): PROBNP in the last 8760 hours. HbA1C: No results for input(s): HGBA1C in the last 72 hours. CBG: No results for input(s): GLUCAP in the last 168 hours. Lipid Profile: No results for input(s): CHOL, HDL, LDLCALC, TRIG,  CHOLHDL, LDLDIRECT in the last 72 hours. Thyroid Function Tests: Recent Labs    10/18/20 0656  TSH 1.197   Anemia Panel: No results for input(s): VITAMINB12, FOLATE, FERRITIN, TIBC, IRON, RETICCTPCT in the last 72 hours. Sepsis Labs: Recent Labs  Lab 10/18/20 0656  PROCALCITON <0.10    Recent Results (from the past 240 hour(s))  SARS Coronavirus 2 by RT PCR (hospital order, performed in [facility] hospital lab) Nasopharyngeal Nasopharyngeal Swab     Status: None   Collection Time: 10/07/2020 11:03 PM   Specimen: Nasopharyngeal Swab  Result Value Ref Range Status   SARS Coronavirus 2 NEGATIVE NEGATIVE Final    Comment: (NOTE) SARS-CoV-2 target nucleic acids are NOT DETECTED.  The SARS-CoV-2 RNA is generally detectable in upper and lower respiratory specimens during the acute phase of infection. The lowest concentration of SARS-CoV-2 viral copies this assay can detect is 250 copies / mL. A negative result does not preclude SARS-CoV-2 infection and should not be used as the sole basis for treatment or other patient management decisions.  A negative result may occur with improper specimen collection / handling, submission of specimen other than nasopharyngeal swab, presence of viral mutation(s) within the areas targeted by this assay, and inadequate number of viral copies (<250 copies / mL). A negative result must be combined with clinical observations, patient history, and epidemiological information.  Fact Sheet for Patients:   StrictlyIdeas.no  Fact Sheet for Healthcare Providers: BankingDealers.co.za  This test is not yet approved or  cleared by the [place] FDA and has been authorized for detection and/or diagnosis of SARS-CoV-2 by FDA under an Emergency Use Authorization (EUA).  This EUA will remain in effect (meaning this test can be used) for the duration of the COVID-19 declaration under Section 564(b)(1) of the  Act, 21 U.S.C. section 360bbb-3(b)(1), unless the authorization is terminated or revoked sooner.  Performed at [facility], [place] [address], [place], [place] [address]   Culture, blood (Routine X 2) w Reflex to ID Panel     Status: None (Preliminary result)   Collection Time: 10/18/20  6:22 AM   Specimen: BLOOD RIGHT FOREARM  Result Value Ref Range Status   Specimen Description BLOOD RIGHT FOREARM  Final   Special Requests   Final    BOTTLES DRAWN AEROBIC AND ANAEROBIC Blood Culture results may not be optimal due to an inadequate volume of blood received in culture bottles  Culture   Final    NO GROWTH 2 DAYS Performed at [facility], [place] [address], [place], [place] [address]    Report Status PENDING  Incomplete  Culture, blood (Routine X 2) w Reflex to ID Panel     Status: None (Preliminary result)   Collection Time: 10/18/20  1:52 PM   Specimen: BLOOD  Result Value Ref Range Status   Specimen Description BLOOD SITE NOT SPECIFIED  Final   Special Requests   Final    BOTTLES DRAWN AEROBIC AND ANAEROBIC Blood Culture results may not be optimal due to an inadequate volume of blood received in culture bottles   Culture   Final    NO GROWTH 2 DAYS Performed at [facility], [place] [address] [place], [place] [address]    Report Status PENDING  Incomplete  Urine culture     Status: None   Collection Time: 10/18/20  5:04 PM   Specimen: Urine, Catheterized  Result Value Ref Range Status   Specimen Description URINE, CATHETERIZED  Final   Special Requests NONE  Final   Culture   Final    NO GROWTH Performed at [facility], [address] [place], [place] [address]    Report Status 10/20/2020 FINAL  Final         Radiology Studies: DG CHEST PORT 1 VIEW  Result Date: 10/20/2020 CLINICAL DATA:  Follow-up pneumonia EXAM: PORTABLE CHEST 1 VIEW COMPARISON:  10-19-20 FINDINGS: Cardiac shadow is enlarged in size. Pacing device is again seen. Diffuse  airspace opacities are noted which have increased slightly in the interval from the prior exam. No bony abnormality is seen. Pacing device is again noted. IMPRESSION: Slight increase in airspace opacity bilaterally when compared with the prior exam. Electronically Signed   By: Inez Catalina M.D.   On: 10/20/2020 03:40        Scheduled Meds: . amantadine  100 mg Oral BID  . apixaban  5 mg Oral BID  . carbidopa-levodopa  1 tablet Oral QID  . doxycycline  100 mg Oral Q12H  . famotidine  20 mg Oral Daily  . memantine  5 mg Oral BID  . vitamin B-12  1,000 mcg Oral Daily   Continuous Infusions: . cefTRIAXone (ROCEPHIN)  IV 2 g (10/20/20 0915)     LOS: 2 days    Time spent: 30 minutes    Barb Merino, MD [facility] Pager [phone]

## 2020-10-20 NOTE — TOC Initial Note (Signed)
Transition of Care [facility]) - Initial/Assessment Note    Patient Details  Name: [name] MRN: [id number] Date of Birth: 18-Jul-1931  Transition of Care [facility]) CM/SW Contact:    Joanne Chars, LCSW Phone Number: 10/20/2020, 10:42 AM  Clinical Narrative:   CSW met with pt and wife to discuss discharge plan.  Pt was not able to participate very much, did give permission for CSW to discuss with wife who provided the information.   Wife reports that pt is not vaccinated and she does not want him to be vaccinated.  Her preference for services is [facility] and she is available to provide 24/7 supervision.  Choice document provided. Current equipment in home: rollator, wheelchair, bedside commode, shower chair.         Expected Discharge Plan: [place] Barriers to Discharge: Continued Medical Work up,Other (comment) ([employer] medicare needing [place])   Patient Goals and CMS Choice Patient states their goals for this hospitalization and ongoing recovery are:: be able to walk with the walker CMS Medicare.gov Compare Post Acute Care list provided to:: Patient Represenative (must comment) (wife) Choice offered to / list presented to : Spouse  Expected Discharge Plan and Services Expected Discharge Plan: [place] Choice: [place] arrangements for the past 2 months: Single Family Home                                      Prior Living Arrangements/Services Living arrangements for the past 2 months: Single Family Home Lives with:: Spouse Patient language and need for interpreter reviewed:: Yes        Need for Family Participation in Patient Care: Yes (Comment) Care giver support system in place?: Yes (comment) Current home services: Other (comment) (none) Criminal Activity/Legal Involvement Pertinent to Current Situation/Hospitalization: No - Comment as needed  Activities of Daily Living      Permission  Sought/Granted Permission sought to share information with : Family Supports Permission granted to share information with : Yes, Verbal Permission Granted  Share Information with NAME: wife           Emotional Assessment Appearance:: Appears stated age Attitude/Demeanor/Rapport: Unable to Assess Affect (typically observed): Unable to Assess Orientation: : Oriented to Self,Oriented to Place Alcohol / Substance Use: Not Applicable Psych Involvement: No (comment)  Admission diagnosis:  Pneumonia [J18.9] Atypical pneumonia [J18.9] Community acquired pneumonia, unspecified laterality [J18.9] Patient Active Problem List   Diagnosis Date Noted  . Pneumonia 10/19/2020  . Community acquired pneumonia 10/18/2020  . Acute metabolic encephalopathy 08/65/7846  . Generalized weakness 10/18/2020  . Cognitive dysfunction 07/14/2020  . Lower extremity numbness 06/12/2019  . Weakness of both lower extremities 06/11/2019  . Elevated brain natriuretic peptide (BNP) level 06/11/2019  . Hyperglycemia 06/11/2019  . Leucocytosis 06/11/2019  . Parkinson's disease ([place]) 11/30/2018  . Pelvic pain 11/30/2018  . Intracranial aneurysm 11/28/2016  . Gait disturbance 11/06/2016  . Muscle fatigue 11/06/2016  . Post herpetic neuralgia 11/06/2016  . Left inguinal hernia 10/06/2014  . Shortness of breath 01/03/2014  . Pacemaker-St.Jude 07/09/2012  . Permanent atrial fibrillation 10/20/2010  . HYPERLIPIDEMIA-MIXED 10/26/2009  . Macular degeneration 12/28/2008  . CAD, NATIVE VESSEL 12/28/2008  . GERD 12/28/2008  . SYNCOPE 12/28/2008  . Hypertension 12/28/2008  . Sick sinus syndrome ([place]) 12/28/2008   PCP:  [name], MD Pharmacy:  [id number] [address]Phone:: [phone]::[phone] [facility] [place]Phone: [phone] Fax: [phone]    Social Determinants of Health (SDOH) Interventions    Readmission Risk Interventions No flowsheet data found.

## 2020-10-21 ENCOUNTER — Inpatient Hospital Stay (HOSPITAL_COMMUNITY): Payer: Medicare Other

## 2020-10-21 DIAGNOSIS — R531 Weakness: Secondary | ICD-10-CM | POA: Diagnosis not present

## 2020-10-21 DIAGNOSIS — J189 Pneumonia, unspecified organism: Secondary | ICD-10-CM | POA: Diagnosis not present

## 2020-10-21 DIAGNOSIS — G20 Parkinson's disease: Secondary | ICD-10-CM | POA: Diagnosis not present

## 2020-10-21 DIAGNOSIS — G9341 Metabolic encephalopathy: Secondary | ICD-10-CM | POA: Diagnosis not present

## 2020-10-21 MED ORDER — RESOURCE THICKENUP CLEAR PO POWD
ORAL | Status: DC | PRN
  Filled 2020-10-21: qty 125

## 2020-10-21 MED ORDER — FUROSEMIDE 10 MG/ML IJ SOLN
40.0000 mg | Freq: Once | INTRAMUSCULAR | Status: AC
  Administered 2020-10-21: 40 mg via INTRAVENOUS
  Filled 2020-10-21: qty 4

## 2020-10-21 MED ORDER — AMANTADINE HCL 50 MG/5ML PO SOLN
100.0000 mg | Freq: Two times a day (BID) | ORAL | Status: DC
  Administered 2020-10-21 – 2020-10-23 (×5): 100 mg via ORAL
  Filled 2020-10-21 (×7): qty 10

## 2020-10-21 MED ORDER — ALBUTEROL SULFATE (2.5 MG/3ML) 0.083% IN NEBU
2.5000 mg | INHALATION_SOLUTION | RESPIRATORY_TRACT | Status: DC | PRN
  Administered 2020-10-21 (×2): 2.5 mg via RESPIRATORY_TRACT
  Filled 2020-10-21 (×2): qty 3

## 2020-10-21 MED ORDER — SODIUM CHLORIDE 0.9 % IV SOLN
100.0000 mg | Freq: Two times a day (BID) | INTRAVENOUS | Status: DC
  Administered 2020-10-21 – 2020-10-23 (×5): 100 mg via INTRAVENOUS
  Filled 2020-10-21 (×6): qty 100

## 2020-10-21 NOTE — Progress Notes (Signed)
Physical Therapy Treatment Patient Details Name: [name] MRN: [id number] DOB: 05-05-1931 Today's Date: 10/21/2020    History of Present Illness Pt is an 87 y/o male admitted secondary to increased SOB. Found to have CAP. PMH includes a fib, parkinson's, CAD, and CHF.    PT Comments    Patient with limited progress this session as SpO2 dropping significantly with mobiltiy on 7L HFNC.  Finally rebounded up to 87% with up to 8.5L HFNC.  Wife in the room and supportive.  Patient also with difficulty sequencing steps and difficulty getting to recliner right beside bed so had to bring chair to him.  He remains appropriate for SNF level rehab.  PT to continue to follow.   Follow Up Recommendations  SNF;Supervision/Assistance - 24 hour     Equipment Recommendations  Hospital bed;Other (comment)    Recommendations for Other Services       Precautions / Restrictions Precautions Precautions: Fall Precaution Comments: watch O2    Mobility  Bed Mobility Overal bed mobility: Needs Assistance Bed Mobility: Rolling;Sidelying to Sit Rolling: Mod assist Sidelying to sit: Mod assist       General bed mobility comments: increased time, assist for guiding legs off bed and to lift trunk  Transfers Overall transfer level: Needs assistance Equipment used: Rolling walker (2 wheeled) Transfers: Sit to/from [facility] Sit to Stand: Min assist;From elevated surface Stand pivot transfers: Mod assist       General transfer comment: stood from elevated bed height to RW, stepping to chair pt with flexed posture, difficulty sequencing steps and needing increased help for balance/safety and brought chair up behind him for safety  Ambulation/Gait                 Stairs             Wheelchair Mobility    Modified Rankin (Stroke Patients Only)       Balance Overall balance assessment: Needs assistance Sitting-balance support: Feet supported Sitting  balance-Leahy Scale: Fair     Standing balance support: Bilateral upper extremity supported Standing balance-Leahy Scale: Poor Standing balance comment: reliant on external and UE support with flexed posture                            Cognition Arousal/Alertness: Awake/alert Behavior During Therapy: Flat affect Overall Cognitive Status: History of cognitive impairments - at baseline                                 General Comments: h/o dementia      Exercises General Exercises - Lower Extremity Ankle Circles/Pumps: AROM;Both;10 reps;Supine Heel Slides: AROM;Both;5 reps;Supine    General Comments General comments (skin integrity, edema, etc.): SpO2 on 7L HFNC at rest 91%; seated EOB SpO2 85% on 7L; up to chair SpO2 down low as 77%, up to 82% on 7L so increased to 8.5L HFNC and SpO2 up to 87%; RN made aware, and up to 10 minutes rest to get to 87% with max cues for pursed liip breathing and using incentive spirometer.  Wife in the room and discussed his swallow eval recommendations and RN aware planning to get thickener.      Pertinent Vitals/Pain Pain Assessment: No/denies pain    Home Living                      Prior  Function            PT Goals (current goals can now be found in the care plan section) Progress towards PT goals: Progressing toward goals    Frequency    Min 2X/week      PT Plan Current plan remains appropriate    Co-evaluation              AM-PAC PT "6 Clicks" Mobility   Outcome Measure  Help needed turning from your back to your side while in a flat bed without using bedrails?: A Lot Help needed moving from lying on your back to sitting on the side of a flat bed without using bedrails?: A Lot Help needed moving to and from a bed to a chair (including a wheelchair)?: A Lot Help needed standing up from a chair using your arms (e.g., wheelchair or bedside chair)?: A Lot Help needed to walk in hospital  room?: Total Help needed climbing 3-5 steps with a railing? : Total 6 Click Score: 10    End of Session Equipment Utilized During Treatment: Gait belt;Oxygen Activity Tolerance: Patient limited by fatigue Patient left: in chair;with call bell/phone within reach;with chair alarm set   PT Visit Diagnosis: Unsteadiness on feet (R26.81);Muscle weakness (generalized) (M62.81);Difficulty in walking, not elsewhere classified (R26.2)     Time: 9163-8466 PT Time Calculation (min) (ACUTE ONLY): 34 min  Charges:  $Therapeutic Activity: 23-37 mins                     Magda Kiel, PT Acute Rehabilitation Services [phone] Office:[phone] 10/21/2020    Reginia Naas 10/21/2020, 1:52 PM

## 2020-10-21 NOTE — Progress Notes (Signed)
PT Cancellation Note  Patient Details Name: [name] MRN: [id number] DOB: 20-Apr-1931   Cancelled Treatment:    Reason Eval/Treat Not Completed: Patient at procedure or test/unavailable; patient out of the room for MBSS.  Will attempt later if time permits.   Reginia Naas 10/21/2020, 10:10 AM Magda Kiel, PT Acute Rehabilitation Services LDJTT:[phone] Office:[phone] 10/21/2020

## 2020-10-21 NOTE — Progress Notes (Signed)
PROGRESS NOTE    [name]  [id number] DOB: 07-07-1931 DOA: 10/01/2020 PCP: [name], MD    Brief Narrative:  Patient is a 87 y.o.male who presents from homewith medical history significant forpersistent atrial fibrillation chronically anticoagulated on Eliquis and complicated by sick sinus syndrome status post pacemaker placement, Parkinson's disease.  Patient reported to the ED after a 4-5 day period of shortness of breath with a subjective report of fever, and new onset of non-productive cough. Per patient's wife, patient developed confusion relative to his usual baseline and also generalized weakness requiring more assistance than usual. Patient denies chills or myalgia.  Denies chest pain, palpitations, or diaphoresis.  No reported nausea, vomiting, diarrhea, or hemoptysis.  Patient denies any recent travel, COVID-19 exposure, or sick contacts. Patient has no preceding trauma or surgeries.  Patient has no known pulmonary disease, reports a brief smoking history in the 1960's, but quit after that and does not have baseline supplemental oxygen requirements.  ED Course:  On admission, vital signs were notable for the following: Tmax 98.5, heart rate 56-60; blood pressure121/43 - 124/78; respiratory rate 18-24; initial oxygen saturation noted to be in the mid 80s, which improved to 94 to 90% on 3 L nasal cannula.Patient is current on 4L with oxygen saturations, 90-91%.   Chest x-ray showed mixed patchy and coarse opacities in the bilateral lung suggestive of atypical pneumonia without evidence of edema, effusion, or pneumothorax. EKG showed ventricular paced rhythm with ventricular rate 62, without evidence of acute ischemic changes.  Patient was empirically treated for suspected atypical community-acquired pneumonia with broad spectrum antibiotics zithromycin 500 mg IV x1, Rocephin 2 g IV x1, and a 500 cc normal saline bolus.   Assessment & Plan:   Principal Problem:    Community acquired pneumonia Active Problems:   GERD   Permanent atrial fibrillation   Shortness of breath   Parkinson's disease ([place])   Acute metabolic encephalopathy   Generalized weakness   Pneumonia  Community-acquired pneumonia with hypoxia: Continue monitoring due to significant symptoms. Antibiotics to treat bacterial pneumonia, currently on Rocephin and doxycycline.  Will treat with 7 days of therapy.   Chest physiotherapy, incentive spirometry, deep breathing exercises, sputum induction, mucolytic's and bronchodilators. Sputum cultures, blood cultures, negative so far. Legionella antigen pending. Streptococcal antigen negative. Supplemental oxygen to keep saturations more than 90%. Start mobilizing with PT OT. COVID-19 test was negative x2.   Repeat chest x-ray today with more organized consolidation.   2/4 morning, patient had episode of aspiration, increasing oxygen requirement.  Chest x-ray stable.  CT head with no evidence of new infarct or stroke.  No focal deficits. Patient probably continues to aspirate.  Speech therapy to see. With ongoing issues, he is also very poor candidate for recovery, will start palliative discussion.  Consult palliative care team.  We will continue Rocephin and doxycycline that should cover broad-spectrum.  Acute urinary retention: Patient has history of BPH and frequent urinary retention in the past especially associated with hospitalization and sedation. Foley catheter had to be placed on 2/1.  Successful voiding trial 2/2.  Will monitor. Urinalysis with no evidence of infection.  Parkinson's disease: Stable.  Continue carbidopa levodopa and amantadine.  Permanent A. fib and sick sinus syndrome status post pacemaker: Stable.  Rate controlled.  Therapeutic on Eliquis.  GERD: On PPI.  Physical debility and deconditioning: Work with PT OT.  Anticipate discharge to a skilled nursing facility. [name] wants to take him home as she is  able to have adequate support at home with her family member. Patient is unvaccinated and she is worried about him going to other facilities. Husband and wife declined COVID-83 vaccination.    DVT prophylaxis:  apixaban (ELIQUIS) tablet 5 mg   Code Status: DNR nothing changed Family Communication: Wife at the bedside Disposition Plan: Status is: Inpatient  Remains inpatient appropriate because:Inpatient level of care appropriate due to severity of illness   Dispo:  Patient From: Home  Planned Disposition: Home with Health Care Svc versus SNF  Expected discharge date: 10/22/2020  Medically stable for discharge: No           Consultants:   None  Procedures:   None  Antimicrobials:  Antibiotics Given (last 72 hours)    Date/Time Action Medication Dose Rate   10/19/20 0814 New Bag/Given   cefTRIAXone (ROCEPHIN) 2 g in sodium chloride 0.9 % 100 mL IVPB 2 g 200 mL/hr   10/19/20 0900 New Bag/Given   azithromycin (ZITHROMAX) 500 mg in sodium chloride 0.9 % 250 mL IVPB 500 mg 250 mL/hr   10/20/20 0915 New Bag/Given   cefTRIAXone (ROCEPHIN) 2 g in sodium chloride 0.9 % 100 mL IVPB 2 g 200 mL/hr   10/20/20 [id number] Given   doxycycline (VIBRA-TABS) tablet 100 mg 100 mg    10/20/20 2121 Given   doxycycline (VIBRA-TABS) tablet 100 mg 100 mg    10/21/20 0800 New Bag/Given   cefTRIAXone (ROCEPHIN) 2 g in sodium chloride 0.9 % 100 mL IVPB 2 g 200 mL/hr       Subjective: Patient seen and examined.  He was slightly more tired and lethargic today.  Overnight events noted.  After patient's wife gave him a glass of water he started having violent cough and choking episodes.  His oxygen requirement increased.  Patient is talking with husky voice.  Afebrile.  Currently on 6 L oxygen.  Objective: Vitals:   10/20/20 2125 10/21/20 0400 10/21/20 0535 10/21/20 0930  BP: 102/61  122/71   Pulse: 60  63   Resp: 20  20   Temp: 97.6 F (36.4 C)  98.6 F (37 C)   TempSrc:      SpO2: 90%  94% 94% 93%  Weight:      Height:        Intake/Output Summary (Last 24 hours) at 10/21/2020 1119 Last data filed at 10/21/2020 0630 Gross per 24 hour  Intake --  Output 250 ml  Net -250 ml   Filed Weights   10/19/20 1415 10/20/20 0500  Weight: 70.1 kg 72.7 kg    Examination:  General exam: Appears calm and comfortable  Chronically sick looking.  Debilitated. SpO2: 93 % O2 Flow Rate (L/min): 7 L/min  Respiratory system: Patient mostly has conducted upper airway sounds. Cardiovascular system: S1 & S2 heard, RRR.  Pacemaker in place.  Nontender. Gastrointestinal system: Abdomen is nondistended, soft and nontender. No organomegaly or masses felt. Normal bowel sounds heard. Foley catheter with clear urine. Central nervous system: Alert and oriented x2.  He is slow to respond.  Looks lethargic and tired. Extremities: Symmetric 5 x 5 power.  Generalized weakness.    Data Reviewed: I have personally reviewed following labs and imaging studies  CBC: Recent Labs  Lab 09/19/2020 2300 10/18/20 0809 10/19/20 0452  WBC 9.6  --  11.6*  NEUTROABS  --   --  9.2*  HGB 13.8 14.3 13.0  HCT 42.9 42.0 40.4  MCV 92.7  --  96.0  PLT 238  --  803   Basic Metabolic Panel: Recent Labs  Lab 10/06/2020 2300 10/18/20 0656 10/18/20 0809 10/19/20 0452  NA 140  --  142 139  K 3.6  --  4.3 4.0  CL 105  --   --  109  CO2 22  --   --  19*  GLUCOSE 106*  --   --  106*  BUN 16  --   --  14  CREATININE 0.87  --   --  0.74  CALCIUM 8.4*  --   --  8.0*  MG  --  2.2  --  1.9  PHOS  --  4.1  --  3.6   GFR: Estimated Creatinine Clearance: 66.9 mL/min (by C-G formula based on SCr of 0.74 mg/dL). Liver Function Tests: No results for input(s): AST, ALT, ALKPHOS, BILITOT, PROT, ALBUMIN in the last 168 hours. No results for input(s): LIPASE, AMYLASE in the last 168 hours. No results for input(s): AMMONIA in the last 168 hours. Coagulation Profile: No results for input(s): INR, PROTIME in the last  168 hours. Cardiac Enzymes: No results for input(s): CKTOTAL, CKMB, CKMBINDEX, TROPONINI in the last 168 hours. BNP (last 3 results) No results for input(s): PROBNP in the last 8760 hours. HbA1C: No results for input(s): HGBA1C in the last 72 hours. CBG: No results for input(s): GLUCAP in the last 168 hours. Lipid Profile: No results for input(s): CHOL, HDL, LDLCALC, TRIG, CHOLHDL, LDLDIRECT in the last 72 hours. Thyroid Function Tests: No results for input(s): TSH, T4TOTAL, FREET4, T3FREE, THYROIDAB in the last 72 hours. Anemia Panel: No results for input(s): VITAMINB12, FOLATE, FERRITIN, TIBC, IRON, RETICCTPCT in the last 72 hours. Sepsis Labs: Recent Labs  Lab 10/18/20 0656  PROCALCITON <0.10    Recent Results (from the past 240 hour(s))  SARS Coronavirus 2 by RT PCR (hospital order, performed in [facility] hospital lab) Nasopharyngeal Nasopharyngeal Swab     Status: None   Collection Time: 10/08/2020 11:03 PM   Specimen: Nasopharyngeal Swab  Result Value Ref Range Status   SARS Coronavirus 2 NEGATIVE NEGATIVE Final    Comment: (NOTE) SARS-CoV-2 target nucleic acids are NOT DETECTED.  The SARS-CoV-2 RNA is generally detectable in upper and lower respiratory specimens during the acute phase of infection. The lowest concentration of SARS-CoV-2 viral copies this assay can detect is 250 copies / mL. A negative result does not preclude SARS-CoV-2 infection and should not be used as the sole basis for treatment or other patient management decisions.  A negative result may occur with improper specimen collection / handling, submission of specimen other than nasopharyngeal swab, presence of viral mutation(s) within the areas targeted by this assay, and inadequate number of viral copies (<250 copies / mL). A negative result must be combined with clinical observations, patient history, and epidemiological information.  Fact Sheet for Patients:    StrictlyIdeas.no  Fact Sheet for Healthcare Providers: BankingDealers.co.za  This test is not yet approved or  cleared by the [place] FDA and has been authorized for detection and/or diagnosis of SARS-CoV-2 by FDA under an Emergency Use Authorization (EUA).  This EUA will remain in effect (meaning this test can be used) for the duration of the COVID-19 declaration under Section 564(b)(1) of the Act, 21 U.S.C. section 360bbb-3(b)(1), unless the authorization is terminated or revoked sooner.  Performed at [facility], [place] [address], [place], [place] [address]   Culture, blood (Routine X 2) w Reflex to ID Panel     Status:  None (Preliminary result)   Collection Time: 10/18/20  6:22 AM   Specimen: BLOOD RIGHT FOREARM  Result Value Ref Range Status   Specimen Description BLOOD RIGHT FOREARM  Final   Special Requests   Final    BOTTLES DRAWN AEROBIC AND ANAEROBIC Blood Culture results may not be optimal due to an inadequate volume of blood received in culture bottles   Culture   Final    NO GROWTH 3 DAYS Performed at [facility], [place] [address], [place], [place] [address]    Report Status PENDING  Incomplete  Culture, blood (Routine X 2) w Reflex to ID Panel     Status: None (Preliminary result)   Collection Time: 10/18/20  1:52 PM   Specimen: BLOOD  Result Value Ref Range Status   Specimen Description BLOOD SITE NOT SPECIFIED  Final   Special Requests   Final    BOTTLES DRAWN AEROBIC AND ANAEROBIC Blood Culture results may not be optimal due to an inadequate volume of blood received in culture bottles   Culture   Final    NO GROWTH 3 DAYS Performed at [facility], [place] [address] [place], [place] [address]    Report Status PENDING  Incomplete  Urine culture     Status: None   Collection Time: 10/18/20  5:04 PM   Specimen: Urine, Catheterized  Result Value Ref Range Status   Specimen Description URINE,  CATHETERIZED  Final   Special Requests NONE  Final   Culture   Final    NO GROWTH Performed at [facility], [address] [place],  [address]    Report Status 10/20/2020 FINAL  Final         Radiology Studies: CT HEAD WO CONTRAST  Result Date: 10/21/2020 CLINICAL DATA:  Altered mental status, confusion EXAM: CT HEAD WITHOUT CONTRAST TECHNIQUE: Contiguous axial images were obtained from the base of the skull through the vertex without intravenous contrast. COMPARISON:  09/16/2020 FINDINGS: Brain: Normal anatomic configuration. Parenchymal volume loss is commensurate with the patient's age. Extensive subcortical and periventricular white matter changes are present likely reflecting the sequela of small vessel ischemia. Remote lacunar infarcts are noted within the insular cortices bilaterally as well as the anterior limb of the right internal capsule no abnormal intra or extra-axial mass lesion or fluid collection. No abnormal mass effect or midline shift. No evidence of acute intracranial hemorrhage or infarct. Ventricular size is normal. Cerebellum unremarkable. Vascular: No asymmetric hyperdense vasculature at the skull base. Skull: Intact Sinuses/Orbits: Small air-fluid level noted within the right maxillary sinus. Debris noted within the left maxillary sinus and sphenoid sinuses. Remaining paranasal sinuses are clear. Orbits are unremarkable. Other: Mastoid air cells and middle ear cavities are clear. IMPRESSION: Stable extensive senescent change.  Stable remote infarcts. No evidence of acute intracranial hemorrhage or infarct. Mild bilateral paranasal sinus disease, mildly progressive since prior examination. Electronically Signed   By: Fidela Salisbury MD   On: 10/21/2020 10:16   DG CHEST PORT 1 VIEW  Result Date: 10/21/2020 CLINICAL DATA:  Airway aspiration EXAM: PORTABLE CHEST 1 VIEW COMPARISON:  Yesterday FINDINGS: Chronic lung disease based on remote imaging. There is also  acute appearing superimposed reticular opacity which is bilateral (comparing 2020 with admission radiographs). No visible effusion or pneumothorax. Cardiomegaly. Stable positioning of dual-chamber pacer leads from the left. No visible effusion or pneumothorax. IMPRESSION: Unchanged acute on chronic pulmonary opacification. Interval aspiration could easily be obscured by the extent of  pre-existing disease. Electronically Signed   By: Monte Fantasia M.D.   On: 10/21/2020 05:01   DG CHEST PORT 1 VIEW  Result Date: 10/20/2020 CLINICAL DATA:  Follow-up pneumonia EXAM: PORTABLE CHEST 1 VIEW COMPARISON:  09/23/2020 FINDINGS: Cardiac shadow is enlarged in size. Pacing device is again seen. Diffuse airspace opacities are noted which have increased slightly in the interval from the prior exam. No bony abnormality is seen. Pacing device is again noted. IMPRESSION: Slight increase in airspace opacity bilaterally when compared with the prior exam. Electronically Signed   By: Inez Catalina M.D.   On: 10/20/2020 03:40        Scheduled Meds: . amantadine  100 mg Oral BID  . apixaban  5 mg Oral BID  . carbidopa-levodopa  1 tablet Oral QID  . famotidine  20 mg Oral Daily  . memantine  5 mg Oral BID  . vitamin B-12  1,000 mcg Oral Daily   Continuous Infusions: . cefTRIAXone (ROCEPHIN)  IV 2 g (10/21/20 0800)  . doxycycline (VIBRAMYCIN) IV       LOS: 3 days    Time spent: 30 minutes    Barb Merino, MD [facility] Pager [phone]

## 2020-10-21 NOTE — Progress Notes (Signed)
Modified Barium Swallow Progress Note  Patient Details  Name: [name] MRN: [id number] Date of Birth: April 12, 1931  Today's Date: 10/21/2020  Modified Barium Swallow completed.  Full report located under Chart Review in the Imaging Section.  Brief recommendations include the following:  Clinical Impression  Pt presents with oropharyngeal dysphagia characterized by impaired mastication, reduced bolus cohesion, impaired bolus propulsion, reduced lingual retraction, and a pharyngeal delay. Pt demonstrated prolonged and vigorous mastication of solids which persisted beyond the point of necessity; pt intermittently required cueing to swallow dysphagia 3 solids. He exhibited lingual rocking, and premature spillage with liquids. Mild to moderate vallecular residue was noted depending on bolus size. Residue was reduced, but not eliminted, with a liquid wash. When residue was moderate, nectar thick liquids were observed to prematurely spill over the residue and result in penetration (PAS 3) with subsequent aspiration (PAS 7,8). Penetration (PAS 3) and aspiration (PAS 7,8) were observed with thin liquids. This was not eliminated with reduced bolus sizes and he was unable to demonstrate other compensatory strategies, despite cueing. Coughing was inconsistent following aspiration events and ineffective. A dyspahgia 2 diet with nectar thick liquids is recommended at this time. SLP will follow for dysphagia treatment.   Swallow Evaluation Recommendations       SLP Diet Recommendations: Dysphagia 2 (Fine chop) solids;Nectar thick liquid   Liquid Administration via: Cup;No straw   Medication Administration: Crushed with puree   Supervision: Full assist for feeding;Full supervision/cueing for compensatory strategies   Compensations: Slow rate;Small sips/bites;Follow solids with liquid   Postural Changes: Seated upright at 90 degrees   Oral Care Recommendations: Oral care BID   Other  Recommendations: Order thickener from [facility], [place], [place] Office number [phone] Pager [phone]  Horton Marshall 10/21/2020,12:00 PM

## 2020-10-21 NOTE — Progress Notes (Signed)
Dr.Rathore paged and notified of previous event.New orders received.Will continue to monitor patient.

## 2020-10-21 NOTE — Evaluation (Signed)
Clinical/Bedside Swallow Evaluation Patient Details  Name: [name] MRN: [id number] Date of Birth: 1930-10-14  Today's Date: 10/21/2020 Time: SLP Start Time (ACUTE ONLY): 5102 SLP Stop Time (ACUTE ONLY): 0911 SLP Time Calculation (min) (ACUTE ONLY): 24 min  Past Medical History:  Past Medical History:  Diagnosis Date  . CAD (coronary artery disease) 09/2008  . Cancer ([place])    basal cell carcinoma  . CHF (congestive heart failure) ([place])   . Complication of anesthesia    06/12/11 anesthesia made legs asleep also and went into chf and ended up in ICU after gallbladder surgery  . Diastolic dysfunction    hx of chf due to fluid overload 9/12   . Dysrhythmia    atrial fibrillation  . GERD (gastroesophageal reflux disease)   . History of blood product transfusion    d/t bleeding ulcer  . Hypertension   . Macular degeneration (senile) of retina   . Persistent atrial fibrillation ([place])   . PONV (postoperative nausea and vomiting)   . Presence of permanent cardiac pacemaker 2010  . Shortness of breath dyspnea    exertion  . Sick sinus syndrome ([facility])    St. Jude  . Syncope    resolved s/p PPM   Past Surgical History:  Past Surgical History:  Procedure Laterality Date  . CHOLECYSTECTOMY  06/12/11  . INGUINAL HERNIA REPAIR Left 10/06/2014   Procedure: OPEN REPAIR LEFT INGUINAL HERNIA ;  Surgeon: Fanny Skates, MD;  Location: [facility];  Service: General;  Laterality: Left;  . INSERTION OF MESH Left 10/06/2014   Procedure: INSERTION OF MESH;  Surgeon: Fanny Skates, MD;  Location: [facility];  Service: General;  Laterality: Left;  . IR KYPHO LUMBAR INC FX REDUCE BONE BX UNI/BIL CANNULATION INC/IMAGING  12/26/2017  . IR RADIOLOGIST EVAL & MGMT  12/12/2017  . LAPAROSCOPIC CHOLECYSTECTOMY W/ CHOLANGIOGRAPHY  06/12/2011  . PACEMAKER INSERTION  09/28/08   SJM [facility] XL DR  . perforated ulcer    . PPM GENERATOR CHANGEOUT N/A 10/21/2018   Procedure: PPM GENERATOR CHANGEOUT;  Surgeon: Thompson Grayer,  MD;  Location: [facility];  Service: Cardiovascular;  Laterality: N/A;  . right inguinal hernia repair  11/19/2002   with mesh  . TRANSURETHRAL RESECTION OF BLADDER TUMOR  09/06/2011   Procedure: TRANSURETHRAL RESECTION OF BLADDER TUMOR (TURBT);  Surgeon: Ailene Rud, MD;  Location: [facility];  Service: Urology;  Laterality: N/A;  . TRANSURETHRAL RESECTION OF PROSTATE  58/52/7782  . umblical hernia  01/08/5360   HPI:  Pt is an 87 y.o. male with medical history significant for persistent atrial fibrillation chronically anticoagulated on Eliquis and complicated by sick sinus syndrome status post pacemaker placement, Parkinson's disease, who presented to the ED with c/o shortness of breath and was admitted on 06-Nov-2020 with suspected community-acquired pneumonia. Pt demonstrated a coughing episode with water on 2/4 with subsequent desatting to 81% and recovery to 91-93% after increased HOB and use of 8L HFNC. CXR: acute on chronic pulmonary opacification. Interval aspiration could easily be obscured by the extent of pre-existing disease. SLP was consulted secondary to event on 2/4.   Assessment / Plan / Recommendation Clinical Impression  Pt was seen for bedside swallow evaluation with his wife present. Pt's wife stated that the pt was diagnosed with Parkinson's approximately 3 years prior and she denied observance of any symptoms of oropharyngeal dysphagia. Oral mechanism exam was limited due to pt's difficulty following commands; however, oral motor strength and ROM appeared reduced. Dentition  was natural and adequate. He demonstrated symptoms of oropharyngeal dysphagia characterized by prolonged mastication, mild lingual residue, inconsistent coughing with thin liquids and an oral/pharyngeal delay considering the 12 seconds which elapsed following provision of puree boluses. Pt exhibited mildly increased WOB at the end of the evaluation following intake of multiple boluses of thin liquids,  and sats were 84-90% throughout. Ryan Hammans, RN was advised of this and indicated that she would increased his O2. A modified barium swallow study is recommended to further assess swallow function prior to initiating a diet; it is currently scheduled for today at 1000. SLP Visit Diagnosis: Dysphagia, unspecified (R13.10)    Aspiration Risk  Mild aspiration risk    Diet Recommendation  (Defer until MBS completed at 1000)   Liquid Administration via: Cup;Straw Medication Administration: Crushed with puree (critical meds) Supervision: Staff to assist with self feeding    Other  Recommendations Oral Care Recommendations: Oral care QID   Follow up Recommendations  (TBD)      Frequency and Duration min 2x/week  2 weeks       Prognosis Prognosis for Safe Diet Advancement: Good Barriers to Reach Goals: Cognitive deficits      Swallow Study   General Date of Onset: 10/20/20 HPI: Pt is an 87 y.o. male with medical history significant for persistent atrial fibrillation chronically anticoagulated on Eliquis and complicated by sick sinus syndrome status post pacemaker placement, Parkinson's disease, who presented to the ED with c/o shortness of breath and was admitted on 11/06/20 with suspected community-acquired pneumonia. Pt demonstrated a coughing episode with water on 2/4 with subsequent desatting to 81% and recovery to 91-93% after increased HOB and use of 8L HFNC. CXR: acute on chronic pulmonary opacification. Interval aspiration could easily be obscured by the extent of pre-existing disease. SLP was consulted secondary to event on 2/4. Type of Study: Bedside Swallow Evaluation Previous Swallow Assessment: None Diet Prior to this Study: NPO Temperature Spikes Noted: No Respiratory Status: Nasal cannula History of Recent Intubation: No Behavior/Cognition: Alert;Cooperative;Pleasant mood Oral Cavity Assessment: Within Functional Limits Oral Care Completed by SLP: No Vision: Functional for  self-feeding Self-Feeding Abilities: Needs assist Patient Positioning: Upright in bed;Postural control adequate for testing Baseline Vocal Quality: Low vocal intensity Volitional Swallow: Able to elicit    Oral/Motor/Sensory Function Overall Oral Motor/Sensory Function: Generalized oral weakness   Ice Chips Ice chips: Within functional limits Presentation: Spoon   Thin Liquid Thin Liquid: Impaired Presentation: Straw Pharyngeal  Phase Impairments: Change in Vital Signs;Cough - Immediate    Nectar Thick Nectar Thick Liquid: Not tested   Honey Thick Honey Thick Liquid: Not tested   Puree Puree: Impaired Presentation: Spoon Pharyngeal Phase Impairments: Suspected delayed Swallow   Solid     Solid: Impaired Presentation: Self Fed Oral Phase Impairments: Impaired mastication     [facility], [place], [place] Office number [phone] Pager [phone]  Horton Marshall 10/21/2020,9:18 AM

## 2020-10-21 NOTE — Progress Notes (Signed)
  Speech Language Pathology Treatment: Dysphagia  Patient Details Name: [name] MRN: [id number] DOB: Sep 12, 1931 Today's Date: 10/21/2020 Time: 2035-5974 SLP Time Calculation (min) (ACUTE ONLY): 13 min  Assessment / Plan / Recommendation Clinical Impression  Pt was seen for dysphagia treatment. Pt was pleasantly confused which is his baseline per his wife. Pt was educated regarding compensatory strategies for swallowing and the rationale for their use. Pt verbalized understanding. Some of his follow-up questions were related and appropriate, suggesting some comprehension. Pt independently used individual sips once the straw was removed and he reduced bolus sized with puree to ~1/2 tsp with an initial cue. Pt required cueing for appropriate use of some utensils and attempted to use a spoon as a straw at least twice. A dysphagia 2 diet with nectar thick liquids via cup is still recommended. Full supervision will be needed during meals to ensure observation of precautions.    HPI HPI: Pt is an 87 y.o. male with medical history significant for persistent atrial fibrillation chronically anticoagulated on Eliquis and complicated by sick sinus syndrome status post pacemaker placement, Parkinson's disease, who presented to the ED with c/o shortness of breath and was admitted on 10/25/2020 with suspected community-acquired pneumonia. Pt demonstrated a coughing episode with water on 2/4 with subsequent desatting to 81% and recovery to 91-93% after increased HOB and use of 8L HFNC. CXR: acute on chronic pulmonary opacification. Interval aspiration could easily be obscured by the extent of pre-existing disease. SLP was consulted secondary to event on 2/4.      SLP Plan  Continue with current plan of care       Recommendations  Diet recommendations: Dysphagia 2 (fine chop);Nectar-thick liquid Liquids provided via: Cup;No straw Medication Administration: Crushed with puree Supervision: Full  supervision/cueing for compensatory strategies Compensations: Slow rate;Small sips/bites;Follow solids with liquid Postural Changes and/or Swallow Maneuvers: Seated upright 90 degrees;Upright 30-60 min after meal                Oral Care Recommendations: Oral care BID Follow up Recommendations: Skilled Nursing facility SLP Visit Diagnosis: Dysphagia, oropharyngeal phase (R13.12) Plan: Continue with current plan of care       Brylin Stopper I. Hardin Negus, [place], [place] Office number [phone] Pager [phone]                Horton Marshall 10/21/2020, 1:54 PM

## 2020-10-21 NOTE — TOC Progression Note (Addendum)
Transition of Care [facility]) - Progression Note    Patient Details  Name: KAYL STOGDILL MRN: [id number] Date of Birth: 05-30-1931  Transition of Care [facility]) CM/SW Contact  Joanne Chars, LCSW Phone Number: 10/21/2020, 10:34 AM  Clinical Narrative:   CSW met with pt and wife in pt room.  Discussed sending out SNF referral as back up plan to [facility].  Wife agreeable to this.  Will send our referral in hub.  Wife reemphasized that pt is unvaccinated and she would not be willing to have pt go to facility that requires covid vaccine.  Her first choice would be [facility]. SNF choice document provided.   1540: spoke with wife about bed offers so far.  She is not excited about [facility], [facility], [facility] or [facility] due to low ratings.  She asked again about [facility].  CSW LM with Olivia Mackie at [facility] asking her to review.  Wife also asking about [facility] sub acute-LM with [facility]    Expected Discharge Plan: [place] Barriers to Discharge: Continued Medical Work up,Other (comment) ([employer] medicare needing [place])  Expected Discharge Plan and Services Expected Discharge Plan: [place] Choice: [place] arrangements for the past 2 months: Single Family Home                                       Social Determinants of Health (SDOH) Interventions    Readmission Risk Interventions No flowsheet data found.

## 2020-10-21 NOTE — Progress Notes (Signed)
Floor coverage overnight event  Patient admitted for acute hypoxemic respiratory failure secondary to community-acquired pneumonia.  Currently on ceftriaxone and doxycycline.  Notified by RN that after patient's wife gave him water, he started coughing and desatted to 81% on 2L oxygen.  Sats improved to 91-93% on 8 L HFNC. Currently satting 94-95% on 6L HFNC.  Per RN, he is comfortable now without tachypnea or increased WOB.   -Chest x-ray ordered -Keep n.p.o., aspiration precautions, SLP eval

## 2020-10-21 NOTE — NC FL2 (Signed)
[place] LEVEL OF CARE SCREENING TOOL     IDENTIFICATION  Patient Name: [name] Birthdate: 16-Jun-1931 Sex: male Admission Date (Current Location): November 01, 2020  [facility] and [place] Number:  Herbalist and Address:  [facility] [place], [place] [address], [place], [place] [address]      Provider Number: [id number]  Attending Physician Name and Address:  Barb Merino, MD  Relative Name and Phone Number:  Jovontae, [place] [phone]  [phone]    Current Level of Care: Hospital Recommended Level of Care: [place] Prior Approval Number:    Date Approved/Denied:   PASRR Number: [id number]  Discharge Plan: SNF    Current Diagnoses: Patient Active Problem List   Diagnosis Date Noted  . Pneumonia 10/19/2020  . Community acquired pneumonia 10/18/2020  . Acute metabolic encephalopathy 41/32/4401  . Generalized weakness 10/18/2020  . Cognitive dysfunction 07/14/2020  . Lower extremity numbness 06/12/2019  . Weakness of both lower extremities 06/11/2019  . Elevated brain natriuretic peptide (BNP) level 06/11/2019  . Hyperglycemia 06/11/2019  . Leucocytosis 06/11/2019  . Parkinson's disease ([place]) 11/30/2018  . Pelvic pain 11/30/2018  . Intracranial aneurysm 11/28/2016  . Gait disturbance 11/06/2016  . Muscle fatigue 11/06/2016  . Post herpetic neuralgia 11/06/2016  . Left inguinal hernia 10/06/2014  . Shortness of breath 01/03/2014  . Pacemaker-St.Jude 07/09/2012  . Permanent atrial fibrillation 10/20/2010  . HYPERLIPIDEMIA-MIXED 10/26/2009  . Macular degeneration 12/28/2008  . CAD, NATIVE VESSEL 12/28/2008  . GERD 12/28/2008  . SYNCOPE 12/28/2008  . Hypertension 12/28/2008  . Sick sinus syndrome ([place]) 12/28/2008    Orientation RESPIRATION BLADDER Height & Weight     Self,Situation,Place  O2 Incontinent Weight: 160 lb 4.4 oz (72.7 kg) Height:  5\' 11"  (180.3 cm)  BEHAVIORAL SYMPTOMS/MOOD  NEUROLOGICAL BOWEL NUTRITION STATUS      Continent Diet (see discharge summary)  AMBULATORY STATUS COMMUNICATION OF NEEDS Skin   Total Care Verbally Normal (ecchymosis)                       Personal Care Assistance Level of Assistance  Bathing,Feeding,Dressing Bathing Assistance: Maximum assistance Feeding assistance: Independent Dressing Assistance: Maximum assistance     Functional Limitations Info  Sight,Hearing,Speech Sight Info: Adequate Hearing Info: Adequate Speech Info: Adequate    SPECIAL CARE FACTORS FREQUENCY  PT (By licensed PT),OT (By licensed OT),Speech therapy     PT Frequency: 5x week OT Frequency: 5x week     Speech Therapy Frequency: 2x week      Contractures Contractures Info: Not present    Additional Factors Info  Code Status,Allergies Code Status Info: DNR Allergies Info: Hydrocodone, Hydroxyzine           Current Medications (10/21/2020):  This is the current hospital active medication list Current Facility-Administered Medications  Medication Dose Route Frequency Provider Last Rate Last Admin  . acetaminophen (TYLENOL) tablet 650 mg  650 mg Oral Q6H PRN Howerter, Justin B, DO       Or  . acetaminophen (TYLENOL) suppository 650 mg  650 mg Rectal Q6H PRN Howerter, Justin B, DO      . amantadine (SYMMETREL) 50 MG/5ML solution 100 mg  100 mg Oral BID Barb Merino, MD   100 mg at 10/21/20 1000  . apixaban (ELIQUIS) tablet 5 mg  5 mg Oral BID Howerter, Justin B, DO   5 mg at 10/21/20 0941  . carbidopa-levodopa (SINEMET IR) 25-100 MG per tablet immediate release 1  tablet  1 tablet Oral QID Howerter, Justin B, DO   1 tablet at 10/21/20 0941  . cefTRIAXone (ROCEPHIN) 2 g in sodium chloride 0.9 % 100 mL IVPB  2 g Intravenous Q24H Howerter, Justin B, DO 200 mL/hr at 10/21/20 0800 2 g at 10/21/20 0800  . doxycycline (VIBRAMYCIN) 100 mg in sodium chloride 0.9 % 250 mL IVPB  100 mg Intravenous Q12H Shela Leff, MD      . famotidine (PEPCID)  tablet 20 mg  20 mg Oral Daily Howerter, Justin B, DO   20 mg at 10/21/20 0939  . memantine (NAMENDA) tablet 5 mg  5 mg Oral BID Howerter, Justin B, DO   5 mg at 10/21/20 0939  . vitamin B-12 (CYANOCOBALAMIN) tablet 1,000 mcg  1,000 mcg Oral Daily Howerter, Justin B, DO   1,000 mcg at 10/21/20 0940     Discharge Medications: Please see discharge summary for a list of discharge medications.  Relevant Imaging Results:  Relevant Lab Results:   Additional Information SSN [id number]  Joanne Chars, LCSW

## 2020-10-21 NOTE — Progress Notes (Signed)
Patient wife gave patient water.  Patient started coughing, sats down to 81%, placed patient on HIFLO nasal cannula, humidified @ 8L, increased HOB, and sats up to 91-93%

## 2020-10-21 NOTE — Plan of Care (Signed)
Patient remains on High flow O2,Sats decrease with exertion. Fall and aspiration precautions maintained.

## 2020-10-22 ENCOUNTER — Inpatient Hospital Stay (HOSPITAL_COMMUNITY): Payer: Medicare Other

## 2020-10-22 DIAGNOSIS — R531 Weakness: Secondary | ICD-10-CM | POA: Diagnosis not present

## 2020-10-22 DIAGNOSIS — Z7189 Other specified counseling: Secondary | ICD-10-CM

## 2020-10-22 DIAGNOSIS — I361 Nonrheumatic tricuspid (valve) insufficiency: Secondary | ICD-10-CM | POA: Diagnosis not present

## 2020-10-22 DIAGNOSIS — J189 Pneumonia, unspecified organism: Secondary | ICD-10-CM | POA: Diagnosis not present

## 2020-10-22 DIAGNOSIS — G20 Parkinson's disease: Secondary | ICD-10-CM | POA: Diagnosis not present

## 2020-10-22 DIAGNOSIS — I340 Nonrheumatic mitral (valve) insufficiency: Secondary | ICD-10-CM | POA: Diagnosis not present

## 2020-10-22 DIAGNOSIS — R0602 Shortness of breath: Secondary | ICD-10-CM | POA: Diagnosis not present

## 2020-10-22 DIAGNOSIS — I351 Nonrheumatic aortic (valve) insufficiency: Secondary | ICD-10-CM | POA: Diagnosis not present

## 2020-10-22 DIAGNOSIS — G9341 Metabolic encephalopathy: Secondary | ICD-10-CM | POA: Diagnosis not present

## 2020-10-22 LAB — CBC WITH DIFFERENTIAL/PLATELET
Abs Immature Granulocytes: 0.19 10*3/uL — ABNORMAL HIGH (ref 0.00–0.07)
Basophils Absolute: 0 10*3/uL (ref 0.0–0.1)
Basophils Relative: 0 %
Eosinophils Absolute: 0 10*3/uL (ref 0.0–0.5)
Eosinophils Relative: 0 %
HCT: 36.7 % — ABNORMAL LOW (ref 39.0–52.0)
Hemoglobin: 12.7 g/dL — ABNORMAL LOW (ref 13.0–17.0)
Immature Granulocytes: 2 %
Lymphocytes Relative: 8 %
Lymphs Abs: 0.9 10*3/uL (ref 0.7–4.0)
MCH: 31.4 pg (ref 26.0–34.0)
MCHC: 34.6 g/dL (ref 30.0–36.0)
MCV: 90.8 fL (ref 80.0–100.0)
Monocytes Absolute: 1.2 10*3/uL — ABNORMAL HIGH (ref 0.1–1.0)
Monocytes Relative: 9 %
Neutro Abs: 10.1 10*3/uL — ABNORMAL HIGH (ref 1.7–7.7)
Neutrophils Relative %: 81 %
Platelets: 195 10*3/uL (ref 150–400)
RBC: 4.04 MIL/uL — ABNORMAL LOW (ref 4.22–5.81)
RDW: 14.5 % (ref 11.5–15.5)
WBC: 12.5 10*3/uL — ABNORMAL HIGH (ref 4.0–10.5)
nRBC: 0 % (ref 0.0–0.2)

## 2020-10-22 LAB — MAGNESIUM: Magnesium: 1.7 mg/dL (ref 1.7–2.4)

## 2020-10-22 LAB — ECHOCARDIOGRAM COMPLETE
Height: 71 in
S' Lateral: 3.3 cm
Single Plane A4C EF: 43.7 %
Weight: 2564.39 oz

## 2020-10-22 LAB — BASIC METABOLIC PANEL
Anion gap: 13 (ref 5–15)
BUN: 12 mg/dL (ref 8–23)
CO2: 21 mmol/L — ABNORMAL LOW (ref 22–32)
Calcium: 8.2 mg/dL — ABNORMAL LOW (ref 8.9–10.3)
Chloride: 107 mmol/L (ref 98–111)
Creatinine, Ser: 0.8 mg/dL (ref 0.61–1.24)
GFR, Estimated: >60 mL/min (ref >60)
Glucose, Bld: 126 mg/dL — ABNORMAL HIGH (ref 70–99)
Potassium: 3.2 mmol/L — ABNORMAL LOW (ref 3.5–5.1)
Sodium: 141 mmol/L (ref 135–145)

## 2020-10-22 LAB — PHOSPHORUS: Phosphorus: 3.8 mg/dL (ref 2.5–4.6)

## 2020-10-22 MED ORDER — MAGNESIUM SULFATE 2 GM/50ML IV SOLN
2.0000 g | Freq: Once | INTRAVENOUS | Status: AC
  Administered 2020-10-22: 2 g via INTRAVENOUS
  Filled 2020-10-22: qty 50

## 2020-10-22 MED ORDER — FUROSEMIDE 10 MG/ML IJ SOLN
40.0000 mg | Freq: Once | INTRAMUSCULAR | Status: AC
  Administered 2020-10-22: 40 mg via INTRAVENOUS
  Filled 2020-10-22: qty 4

## 2020-10-22 MED ORDER — POTASSIUM CHLORIDE CRYS ER 20 MEQ PO TBCR
40.0000 meq | EXTENDED_RELEASE_TABLET | Freq: Two times a day (BID) | ORAL | Status: DC
  Administered 2020-10-22 – 2020-10-23 (×3): 40 meq via ORAL
  Filled 2020-10-22 (×3): qty 2

## 2020-10-22 NOTE — Progress Notes (Signed)
PROGRESS NOTE    [name]  OIZ:[id number] DOB: [birth year] DOA: 10/10/2020 PCP: [name], MD    Brief Narrative:  Patient is a 87 y.o.male who presents from homewith medical history significant forpersistent atrial fibrillation chronically anticoagulated on Eliquis and complicated by sick sinus syndrome status post pacemaker placement, Parkinson's disease.  Patient reported to the ED after a 4-5 day period of shortness of breath with a subjective report of fever, and new onset of non-productive cough. Per patient's wife, patient developed confusion relative to his usual baseline and also generalized weakness requiring more assistance than usual. Patient denies chills or myalgia.  Denies chest pain, palpitations, or diaphoresis.  No reported nausea, vomiting, diarrhea, or hemoptysis.  Patient denies any recent travel, COVID-19 exposure, or sick contacts. Patient has no preceding trauma or surgeries.  Patient has no known pulmonary disease, reports a brief smoking history in the 1960's, but quit after that and does not have baseline supplemental oxygen requirements.  ED Course:  On admission, vital signs were notable for the following: Tmax 98.5, heart rate 56-60; blood pressure121/43 - 124/78; respiratory rate 18-24; initial oxygen saturation noted to be in the mid 80s, which improved to 94 to 90% on 3 L nasal cannula.Patient is current on 4L with oxygen saturations, 90-91%.   Chest x-ray showed mixed patchy and coarse opacities in the bilateral lung suggestive of atypical pneumonia without evidence of edema, effusion, or pneumothorax. EKG showed ventricular paced rhythm with ventricular rate 62, without evidence of acute ischemic changes.  Patient was empirically treated for suspected atypical community-acquired pneumonia with broad spectrum antibiotics zithromycin 500 mg IV x1, Rocephin 2 g IV x1, and a 500 cc normal saline bolus.   Assessment & Plan:   Principal Problem:    Community acquired pneumonia Active Problems:   GERD   Permanent atrial fibrillation   Shortness of breath   Parkinson's disease ([place])   Acute metabolic encephalopathy   Generalized weakness   Pneumonia  Community-acquired pneumonia with hypoxia: Also suspect aspiration pneumonia. Patient probably has underlying fibrotic lung disease, undiagnosed. Antibiotics to treat bacterial pneumonia, currently on Rocephin and doxycycline.  Will treat with 7 days of therapy.   Chest physiotherapy, incentive spirometry, deep breathing exercises, sputum induction, mucolytic's and bronchodilators. All cultures are negative so far. Supplemental oxygen to keep saturations more than 90%. Start mobilizing with PT OT. COVID-19 test was negative x2.   Repeat chest x-ray today with extensive bilateral consolidation. 2/4 morning, patient had episode of aspiration, increasing oxygen requirement.  CT head with no evidence of new infarct or stroke.  No focal deficits. With ongoing issues, he is also very poor candidate for recovery, will start palliative discussion.  Consult palliative care team.  We will continue Rocephin and doxycycline that should cover broad-spectrum. Suspect he has underlying chronic lung disease and now exacerbated by superadded bacterial infection. Try dose of Lasix today.  Will check echocardiogram for ejection fraction.  Hypokalemia/hypomagnesemia: We will replace and monitor levels.  Acute urinary retention: Patient has history of BPH and frequent urinary retention in the past especially associated with hospitalization and sedation. Foley catheter had to be placed on 2/1.  Successful voiding trial 2/2.  Will monitor. Urinalysis with no evidence of infection.  Parkinson's disease: Stable.  Continue carbidopa levodopa and amantadine.  Permanent A. fib and sick sinus syndrome status post pacemaker: Stable.  Rate controlled.  Therapeutic on Eliquis.  GERD: On PPI.  Physical debility  and deconditioning: Work with PT OT.  Anticipate discharge to a skilled nursing facility. [name] wants to take him home as she is able to have adequate support at home with her family member. Patient is unvaccinated and she is worried about him going to other facilities. Husband and wife declined COVID-57 vaccination.    DVT prophylaxis:  apixaban (ELIQUIS) tablet 5 mg   Code Status: DNR  Family Communication: Wife at the bedside, niece on the phone. Disposition Plan: Status is: Inpatient  Remains inpatient appropriate because:Inpatient level of care appropriate due to severity of illness   Dispo:  Patient From: Home  Planned Disposition: Home with Health Care Svc versus SNF  Expected discharge date: Unknown.  Medically stable for discharge: No           Consultants:   Palliative team  Procedures:   None  Antimicrobials:  Antibiotics Given (last 72 hours)    Date/Time Action Medication Dose Rate   10/20/20 0915 New Bag/Given   cefTRIAXone (ROCEPHIN) 2 g in sodium chloride 0.9 % 100 mL IVPB 2 g 200 mL/hr   10/20/20 0925 Given   doxycycline (VIBRA-TABS) tablet 100 mg 100 mg    10/20/20 2121 Given   doxycycline (VIBRA-TABS) tablet 100 mg 100 mg    10/21/20 0800 New Bag/Given   cefTRIAXone (ROCEPHIN) 2 g in sodium chloride 0.9 % 100 mL IVPB 2 g 200 mL/hr   10/21/20 1419 New Bag/Given   doxycycline (VIBRAMYCIN) 100 mg in sodium chloride 0.9 % 250 mL IVPB 100 mg 125 mL/hr   10/21/20 2043 New Bag/Given   doxycycline (VIBRAMYCIN) 100 mg in sodium chloride 0.9 % 250 mL IVPB 100 mg 125 mL/hr   10/22/20 0931 New Bag/Given   cefTRIAXone (ROCEPHIN) 2 g in sodium chloride 0.9 % 100 mL IVPB 2 g 200 mL/hr   10/22/20 [id number] New Bag/Given   doxycycline (VIBRAMYCIN) 100 mg in sodium chloride 0.9 % 250 mL IVPB 100 mg 125 mL/hr       Subjective: Patient seen and examined.  Poor historian.  He himself denies any complaints.  Overnight remained short of breath, currently  on 10 L of oxygen.  Wife at the bedside.  Also able to talk to their niece on the phone. Patient himself states that he is having little bit trouble breathing. Wife reported that she fed him well.  Did not cough on eating.  Objective: Vitals:   10/21/20 1759 10/21/20 2132 10/22/20 0528 10/22/20 0800  BP:  109/61 92/79   Pulse:  61 65 66  Resp:  19 20   Temp:  98.5 F (36.9 C) 98.6 F (37 C)   TempSrc:  Oral Oral   SpO2: 90% 90% 92% 91%  Weight:      Height:        Intake/Output Summary (Last 24 hours) at 10/22/2020 1138 Last data filed at 10/22/2020 0900 Gross per 24 hour  Intake 814.46 ml  Output 1880 ml  Net -1065.54 ml   Filed Weights   10/19/20 1415 10/20/20 0500  Weight: 70.1 kg 72.7 kg    Examination:  General exam: Appears calm.  Comfortable and in mild distress with tachypnea. Chronically sick looking.  Debilitated. SpO2: 91 % O2 Flow Rate (L/min): 9 L/min  Respiratory system: Bilateral basal crackles.  Poor air entry bilateral. Cardiovascular system: S1 & S2 heard, RRR.  Pacemaker in place.  Nontender. Gastrointestinal system: Abdomen is nondistended, soft and nontender. No organomegaly or masses felt. Normal bowel sounds heard. Foley catheter with clear urine. Central nervous system:  Alert and oriented x2.  He is slow to respond.  Looks lethargic and tired. Extremities: Symmetric 5 x 5 power.  Generalized weakness.    Data Reviewed: I have personally reviewed following labs and imaging studies  CBC: Recent Labs  Lab 11/01/2020 2300 10/18/20 0809 10/19/20 0452 10/22/20 0141  WBC 9.6  --  11.6* 12.5*  NEUTROABS  --   --  9.2* 10.1*  HGB 13.8 14.3 13.0 12.7*  HCT 42.9 42.0 40.4 36.7*  MCV 92.7  --  96.0 90.8  PLT 238  --  197 644   Basic Metabolic Panel: Recent Labs  Lab 2020/11/01 2300 10/18/20 0656 10/18/20 0809 10/19/20 0452 10/22/20 0141  NA 140  --  142 139 141  K 3.6  --  4.3 4.0 3.2*  CL 105  --   --  109 107  CO2 22  --   --  19* 21*   GLUCOSE 106*  --   --  106* 126*  BUN 16  --   --  14 12  CREATININE 0.87  --   --  0.74 0.80  CALCIUM 8.4*  --   --  8.0* 8.2*  MG  --  2.2  --  1.9 1.7  PHOS  --  4.1  --  3.6 3.8   GFR: Estimated Creatinine Clearance: 66.9 mL/min (by C-G formula based on SCr of 0.8 mg/dL). Liver Function Tests: No results for input(s): AST, ALT, ALKPHOS, BILITOT, PROT, ALBUMIN in the last 168 hours. No results for input(s): LIPASE, AMYLASE in the last 168 hours. No results for input(s): AMMONIA in the last 168 hours. Coagulation Profile: No results for input(s): INR, PROTIME in the last 168 hours. Cardiac Enzymes: No results for input(s): CKTOTAL, CKMB, CKMBINDEX, TROPONINI in the last 168 hours. BNP (last 3 results) No results for input(s): PROBNP in the last 8760 hours. HbA1C: No results for input(s): HGBA1C in the last 72 hours. CBG: No results for input(s): GLUCAP in the last 168 hours. Lipid Profile: No results for input(s): CHOL, HDL, LDLCALC, TRIG, CHOLHDL, LDLDIRECT in the last 72 hours. Thyroid Function Tests: No results for input(s): TSH, T4TOTAL, FREET4, T3FREE, THYROIDAB in the last 72 hours. Anemia Panel: No results for input(s): VITAMINB12, FOLATE, FERRITIN, TIBC, IRON, RETICCTPCT in the last 72 hours. Sepsis Labs: Recent Labs  Lab 10/18/20 0656  PROCALCITON <0.10    Recent Results (from the past 240 hour(s))  SARS Coronavirus 2 by RT PCR (hospital order, performed in [facility] hospital lab) Nasopharyngeal Nasopharyngeal Swab     Status: None   Collection Time: 11-01-20 11:03 PM   Specimen: Nasopharyngeal Swab  Result Value Ref Range Status   SARS Coronavirus 2 NEGATIVE NEGATIVE Final    Comment: (NOTE) SARS-CoV-2 target nucleic acids are NOT DETECTED.  The SARS-CoV-2 RNA is generally detectable in upper and lower respiratory specimens during the acute phase of infection. The lowest concentration of SARS-CoV-2 viral copies this assay can detect is 250 copies / mL.  A negative result does not preclude SARS-CoV-2 infection and should not be used as the sole basis for treatment or other patient management decisions.  A negative result may occur with improper specimen collection / handling, submission of specimen other than nasopharyngeal swab, presence of viral mutation(s) within the areas targeted by this assay, and inadequate number of viral copies (<250 copies / mL). A negative result must be combined with clinical observations, patient history, and epidemiological information.  Fact Sheet for Patients:   StrictlyIdeas.no  Fact Sheet for Healthcare Providers: https://pope.com/  This test is not yet approved or  cleared by the [place] FDA and has been authorized for detection and/or diagnosis of SARS-CoV-2 by FDA under an Emergency Use Authorization (EUA).  This EUA will remain in effect (meaning this test can be used) for the duration of the COVID-19 declaration under Section 564(b)(1) of the Act, 21 U.S.C. section 360bbb-3(b)(1), unless the authorization is terminated or revoked sooner.  Performed at [facility], [address] [place], [place] [address]   Culture, blood (Routine X 2) w Reflex to ID Panel     Status: None (Preliminary result)   Collection Time: 10/18/20  6:22 AM   Specimen: BLOOD RIGHT FOREARM  Result Value Ref Range Status   Specimen Description BLOOD RIGHT FOREARM  Final   Special Requests   Final    BOTTLES DRAWN AEROBIC AND ANAEROBIC Blood Culture results may not be optimal due to an inadequate volume of blood received in culture bottles   Culture   Final    NO GROWTH 4 DAYS Performed at [facility], [address] [place], [place] [address]    Report Status PENDING  Incomplete  Culture, blood (Routine X 2) w Reflex to ID Panel     Status: None (Preliminary result)   Collection Time: 10/18/20  1:52 PM   Specimen: BLOOD  Result Value Ref Range  Status   Specimen Description BLOOD SITE NOT SPECIFIED  Final   Special Requests   Final    BOTTLES DRAWN AEROBIC AND ANAEROBIC Blood Culture results may not be optimal due to an inadequate volume of blood received in culture bottles   Culture   Final    NO GROWTH 4 DAYS Performed at [facility], [address] [place], [place] [address]    Report Status PENDING  Incomplete  Urine culture     Status: None   Collection Time: 10/18/20  5:04 PM   Specimen: Urine, Catheterized  Result Value Ref Range Status   Specimen Description URINE, CATHETERIZED  Final   Special Requests NONE  Final   Culture   Final    NO GROWTH Performed at [facility], [address], [place], [place] [address]    Report Status 10/20/2020 FINAL  Final         Radiology Studies: CT HEAD WO CONTRAST  Result Date: 10/21/2020 CLINICAL DATA:  Altered mental status, confusion EXAM: CT HEAD WITHOUT CONTRAST TECHNIQUE: Contiguous axial images were obtained from the base of the skull through the vertex without intravenous contrast. COMPARISON:  09/16/2020 FINDINGS: Brain: Normal anatomic configuration. Parenchymal volume loss is commensurate with the patient's age. Extensive subcortical and periventricular white matter changes are present likely reflecting the sequela of small vessel ischemia. Remote lacunar infarcts are noted within the insular cortices bilaterally as well as the anterior limb of the right internal capsule no abnormal intra or extra-axial mass lesion or fluid collection. No abnormal mass effect or midline shift. No evidence of acute intracranial hemorrhage or infarct. Ventricular size is normal. Cerebellum unremarkable. Vascular: No asymmetric hyperdense vasculature at the skull base. Skull: Intact Sinuses/Orbits: Small air-fluid level noted within the right maxillary sinus. Debris noted within the left maxillary sinus and sphenoid sinuses. Remaining paranasal sinuses are clear. Orbits are  unremarkable. Other: Mastoid air cells and middle ear cavities are clear. IMPRESSION: Stable extensive senescent change.  Stable remote infarcts. No evidence of acute intracranial hemorrhage or infarct. Mild  bilateral paranasal sinus disease, mildly progressive since prior examination. Electronically Signed   By: Helyn NumbersAshesh  Parikh MD   On: 10/21/2020 10:16   DG CHEST PORT 1 VIEW  Result Date: 10/22/2020 CLINICAL DATA:  Pneumonia. EXAM: PORTABLE CHEST 1 VIEW COMPARISON:  October 21, 2020. FINDINGS: Stable cardiomediastinal silhouette. Left-sided pacemaker is unchanged in position. Stable bilateral lung opacities are noted which may represent chronic lung disease, although acute superimposed inflammation or pneumonia cannot be excluded. No pneumothorax or pleural effusion is noted. Bony thorax is unremarkable. IMPRESSION: Stable bilateral lung opacities are noted which may represent chronic lung disease, although acute superimposed inflammation or pneumonia cannot be excluded. Electronically Signed   By: Lupita RaiderJames  Green Jr M.D.   On: 10/22/2020 09:35   DG CHEST PORT 1 VIEW  Result Date: 10/21/2020 CLINICAL DATA:  Airway aspiration EXAM: PORTABLE CHEST 1 VIEW COMPARISON:  Yesterday FINDINGS: Chronic lung disease based on remote imaging. There is also acute appearing superimposed reticular opacity which is bilateral (comparing 2020 with admission radiographs). No visible effusion or pneumothorax. Cardiomegaly. Stable positioning of dual-chamber pacer leads from the left. No visible effusion or pneumothorax. IMPRESSION: Unchanged acute on chronic pulmonary opacification. Interval aspiration could easily be obscured by the extent of pre-existing disease. Electronically Signed   By: Marnee SpringJonathon  Watts M.D.   On: 10/21/2020 05:01   DG Swallowing Func-Speech Pathology  Result Date: 10/21/2020 Objective Swallowing Evaluation: Type of Study: MBS-Modified Barium Swallow Study  Patient Details Name: Carolin SicksDelbert S Frappier MRN:  [id number] Date of Birth: October 30, 1930 Today's Date: 10/21/2020 Time: SLP Start Time (ACUTE ONLY): 1010 -SLP Stop Time (ACUTE ONLY): 1030 SLP Time Calculation (min) (ACUTE ONLY): 20 min Past Medical History: Past Medical History: Diagnosis Date . CAD (coronary artery disease) 09/2008 . Cancer (HCC)   basal cell carcinoma . CHF (congestive heart failure) (HCC)  . Complication of anesthesia   06/12/11 anesthesia made legs asleep also and went into chf and ended up in ICU after gallbladder surgery . Diastolic dysfunction   hx of chf due to fluid overload 9/12  . Dysrhythmia   atrial fibrillation . GERD (gastroesophageal reflux disease)  . History of blood product transfusion   d/t bleeding ulcer . Hypertension  . Macular degeneration (senile) of retina  . Persistent atrial fibrillation (HCC)  . PONV (postoperative nausea and vomiting)  . Presence of permanent cardiac pacemaker 2010 . Shortness of breath dyspnea   exertion . Sick sinus syndrome (HCC)   St. Jude . Syncope   resolved s/p PPM Past Surgical History: Past Surgical History: Procedure Laterality Date . CHOLECYSTECTOMY  06/12/11 . INGUINAL HERNIA REPAIR Left 10/06/2014  Procedure: OPEN REPAIR LEFT INGUINAL HERNIA ;  Surgeon: Claud KelpHaywood Ingram, MD;  Location: [facility] OR;  Service: General;  Laterality: Left; . INSERTION OF MESH Left 10/06/2014  Procedure: INSERTION OF MESH;  Surgeon: Claud KelpHaywood Ingram, MD;  Location: [facility] OR;  Service: General;  Laterality: Left; . IR KYPHO LUMBAR INC FX REDUCE BONE BX UNI/BIL CANNULATION INC/IMAGING  12/26/2017 . IR RADIOLOGIST EVAL & MGMT  12/12/2017 . LAPAROSCOPIC CHOLECYSTECTOMY W/ CHOLANGIOGRAPHY  06/12/2011 . PACEMAKER INSERTION  09/28/08  SJM [facility] XL DR . perforated ulcer   . PPM GENERATOR CHANGEOUT N/A 10/21/2018  Procedure: PPM GENERATOR CHANGEOUT;  Surgeon: Hillis RangeAllred, James, MD;  Location: [facility] INVASIVE CV LAB;  Service: Cardiovascular;  Laterality: N/A; . right inguinal hernia repair  11/19/2002  with mesh . TRANSURETHRAL RESECTION OF BLADDER TUMOR   09/06/2011  Procedure: TRANSURETHRAL RESECTION OF BLADDER TUMOR (TURBT);  Surgeon: Lynelle SmokeSigmund  Joya San, MD;  Location: [facility] ORS;  Service: Urology;  Laterality: N/A; . TRANSURETHRAL RESECTION OF PROSTATE  [id number] . umblical hernia  [id number] HPI: Pt is an 87 y.o. male with medical history significant for persistent atrial fibrillation chronically anticoagulated on Eliquis and complicated by sick sinus syndrome status post pacemaker placement, Parkinson's disease, who presented to the ED with c/o shortness of breath and was admitted on 10/11/2020 with suspected community-acquired pneumonia. Pt demonstrated a coughing episode with water on 2/4 with subsequent desatting to 81% and recovery to 91-93% after increased HOB and use of 8L HFNC. CXR: acute on chronic pulmonary opacification. Interval aspiration could easily be obscured by the extent of pre-existing disease. SLP was consulted secondary to event on 2/4.  No data recorded Assessment / Plan / Recommendation CHL IP CLINICAL IMPRESSIONS 10/21/2020 Clinical Impression Pt presents with oropharyngeal dysphagia characterized by impaired mastication, reduced bolus cohesion, impaired bolus propulsion, reduced lingual retraction, and a pharyngeal delay. Pt demonstrated prolonged and vigorous mastication of solids which persisted beyond the point of necessity; pt intermittently required cueing to swallow dysphagia 3 solids. He exhibited lingual rocking, and premature spillage with liquids. Mild to moderate vallecular residue was noted depending on bolus size. Residue was reduced, but not eliminted, with a liquid wash. When residue was moderate, nectar thick liquids were observed to prematurely spill over the residue and result in penetration (PAS 3) with subsequent aspiration (PAS 7,8). Penetration (PAS 3) and aspiration (PAS 7,8) were observed with thin liquids. This was not eliminated with reduced bolus sizes and he was unable to demonstrate other compensatory strategies,  despite cueing. Coughing was inconsistent following aspiration events and ineffective. A dyspahgia 2 diet with nectar thick liquids is recommended at this time. SLP will follow for dysphagia treatment. SLP Visit Diagnosis Dysphagia, oropharyngeal phase (R13.12) Attention and concentration deficit following -- Frontal lobe and executive function deficit following -- Impact on safety and function Mild aspiration risk   CHL IP TREATMENT RECOMMENDATION 10/21/2020 Treatment Recommendations Therapy as outlined in treatment plan below   Prognosis 10/21/2020 Prognosis for Safe Diet Advancement Good Barriers to Reach Goals Cognitive deficits Barriers/Prognosis Comment -- CHL IP DIET RECOMMENDATION 10/21/2020 SLP Diet Recommendations Dysphagia 2 (Fine chop) solids;Nectar thick liquid Liquid Administration via Cup;No straw Medication Administration Crushed with puree Compensations Slow rate;Small sips/bites;Follow solids with liquid Postural Changes Seated upright at 90 degrees   CHL IP OTHER RECOMMENDATIONS 10/21/2020 Recommended Consults -- Oral Care Recommendations Oral care BID Other Recommendations Order thickener from pharmacy   CHL IP FOLLOW UP RECOMMENDATIONS 10/21/2020 Follow up Recommendations Skilled Nursing facility   [facility] IP FREQUENCY AND DURATION 10/21/2020 Speech Therapy Frequency (ACUTE ONLY) min 2x/week Treatment Duration 2 weeks      CHL IP ORAL PHASE 10/21/2020 Oral Phase Impaired Oral - Pudding Teaspoon -- Oral - Pudding Cup -- Oral - Honey Teaspoon -- Oral - Honey Cup Premature spillage;Decreased bolus cohesion Oral - Nectar Teaspoon -- Oral - Nectar Cup Premature spillage;Decreased bolus cohesion;Reduced posterior propulsion;Lingual pumping Oral - Nectar Straw Premature spillage;Decreased bolus cohesion;Lingual pumping;Reduced posterior propulsion Oral - Thin Teaspoon Premature spillage;Decreased bolus cohesion Oral - Thin Cup Premature spillage;Decreased bolus cohesion;Reduced posterior propulsion;Lingual pumping Oral  - Thin Straw Premature spillage;Decreased bolus cohesion;Lingual pumping;Reduced posterior propulsion Oral - Puree Premature spillage;Decreased bolus cohesion Oral - Mech Soft WFL Oral - Regular -- Oral - Multi-Consistency -- Oral - Pill -- Oral Phase - Comment --  CHL IP PHARYNGEAL PHASE 10/21/2020 Pharyngeal Phase Impaired Pharyngeal- Pudding Teaspoon -- Pharyngeal --  Pharyngeal- Pudding Cup -- Pharyngeal -- Pharyngeal- Honey Teaspoon -- Pharyngeal -- Pharyngeal- Honey Cup Reduced tongue base retraction;Pharyngeal residue - valleculae;Pharyngeal residue - pyriform;Reduced anterior laryngeal mobility Pharyngeal Material does not enter airway Pharyngeal- Nectar Teaspoon -- Pharyngeal -- Pharyngeal- Nectar Cup Reduced tongue base retraction;Pharyngeal residue - valleculae;Pharyngeal residue - pyriform;Reduced anterior laryngeal mobility;Penetration/Aspiration during swallow;Penetration/Apiration after swallow Pharyngeal -- Pharyngeal- Nectar Straw Reduced tongue base retraction;Pharyngeal residue - valleculae;Pharyngeal residue - pyriform;Reduced anterior laryngeal mobility;Penetration/Aspiration during swallow;Penetration/Apiration after swallow Pharyngeal Material enters airway, CONTACTS cords and then ejected out;Material enters airway, passes BELOW cords without attempt by patient to eject out (silent aspiration) Pharyngeal- Thin Teaspoon Reduced tongue base retraction;Pharyngeal residue - valleculae;Pharyngeal residue - pyriform;Reduced anterior laryngeal mobility;Penetration/Aspiration during swallow;Penetration/Apiration after swallow Pharyngeal Material enters airway, CONTACTS cords and not ejected out Pharyngeal- Thin Cup Reduced tongue base retraction;Pharyngeal residue - valleculae;Pharyngeal residue - pyriform;Reduced anterior laryngeal mobility;Penetration/Aspiration during swallow;Penetration/Apiration after swallow Pharyngeal Material enters airway, CONTACTS cords and not ejected out;Material enters  airway, passes BELOW cords without attempt by patient to eject out (silent aspiration);Material enters airway, passes BELOW cords and not ejected out despite cough attempt by patient Pharyngeal- Thin Straw Reduced tongue base retraction;Pharyngeal residue - valleculae;Pharyngeal residue - pyriform;Reduced anterior laryngeal mobility;Penetration/Aspiration during swallow;Penetration/Apiration after swallow Pharyngeal Material enters airway, CONTACTS cords and not ejected out;Material enters airway, passes BELOW cords and not ejected out despite cough attempt by patient;Material enters airway, passes BELOW cords without attempt by patient to eject out (silent aspiration) Pharyngeal- Puree Reduced tongue base retraction;Pharyngeal residue - valleculae;Pharyngeal residue - pyriform;Reduced anterior laryngeal mobility Pharyngeal -- Pharyngeal- Mechanical Soft Reduced tongue base retraction;Pharyngeal residue - valleculae;Pharyngeal residue - pyriform;Reduced anterior laryngeal mobility Pharyngeal -- Pharyngeal- Regular -- Pharyngeal -- Pharyngeal- Multi-consistency -- Pharyngeal -- Pharyngeal- Pill Reduced tongue base retraction;Pharyngeal residue - valleculae;Pharyngeal residue - pyriform;Reduced anterior laryngeal mobility Pharyngeal -- Pharyngeal Comment --  CHL IP CERVICAL ESOPHAGEAL PHASE 10/21/2020 Cervical Esophageal Phase WFL Pudding Teaspoon -- Pudding Cup -- Honey Teaspoon -- Honey Cup -- Nectar Teaspoon -- Nectar Cup -- Nectar Straw -- Thin Teaspoon -- Thin Cup -- Thin Straw -- Puree -- Mechanical Soft -- Regular -- Multi-consistency -- Pill -- Cervical Esophageal Comment -- Shanika I. Hardin Negus, [place], [place] Office number [phone] Pager [place] 10/21/2020, 12:04 PM                   Scheduled Meds: . amantadine  100 mg Oral BID  . apixaban  5 mg Oral BID  . carbidopa-levodopa  1 tablet Oral QID  . famotidine  20 mg Oral Daily  . furosemide  40 mg  Intravenous Once  . memantine  5 mg Oral BID  . potassium chloride  40 mEq Oral BID  . vitamin B-12  1,000 mcg Oral Daily   Continuous Infusions: . cefTRIAXone (ROCEPHIN)  IV 2 g (10/22/20 0931)  . doxycycline (VIBRAMYCIN) IV 100 mg (10/22/20 0939)  . magnesium sulfate bolus IVPB       LOS: 4 days    Time spent: 30 minutes    Barb Merino, MD [facility] Pager [phone]

## 2020-10-22 NOTE — Consult Note (Signed)
Consultation Note Date: 10/22/2020   Patient Name: [name]  DOB: Aug 22, 1931  MRN: [id number]  Age / Sex: 87 y.o., male  PCP: [name], MD Referring Physician: Barb Merino, MD  Reason for Consultation: Establishing goals of care  HPI/Patient Profile: 87 y.o. male  with past medical history of atrial fibrillation on Eliquis, sick sinus syndrome status post pacemaker placement, and Parkinson's disease. He presented to the emergency department on 09/28/2020 with shortness of breath. Associated symptoms included subjective fever and new onset nonproductive cough.  In the ED, initial oxygen saturation noted in the mid 80's, but improved on 3L nasal cannula. Chest x-ray showed mixed patchy and coarse opacities in the bilateral lung suggestive of atypical pneumonia in the absence of edema, effusion, or pneumothorax. He was admitted to [facility] for management of acute hypoxic respiratory distress in the setting of suspected atypical community-acquired pneumonia.   Clinical Assessment and Goals of Care: I have reviewed medical records including EPIC notes, labs and imaging, discussed with nursing and met at bedside with patient and wife to discuss diagnosis, prognosis, GOC, EOL wishes, disposition, and options.   I introduced Palliative Medicine as specialized medical care for people living with serious illness. It focuses on providing relief from the symptoms and stress of a serious illness.   We discussed a brief life review of the patient. He is originally from [facility]. He and [name] have been married for 61 years. She describes him as a good person with a good sense of humor. They both retired from [employer] (now [employer] They do not have children. They are devoted Christians.   He and [name] currently live in [place]. He was diagnosed with Parkinson's disease about 3 years ago. At his previous baseline, he was  ambulatory with a walker. He has required assistance with bathing and dressing for the past 2 years. However, he has significantly declined over the past 2 weeks. [name] has been his primary caregiver, but has some help from her niece.   We discussed his current illness and what it means in the larger context of his ongoing co-morbidities.  Natural disease trajectory of Parkinson's was discussed. Detailed discussion was had regarding the diagnosis of Parkinson's and its natural trajectory. This includes decreased ability to communicate, ambulate, swallow, and maintain continence. Emphasized that it is a progressive and eventually terminal illness.   I attempted to elicit values and goals of care important to the patient. The main goal is to keep him at home if possible. [name] is adamant that he not go to a SNF.    The difference between aggressive medical intervention and comfort care was considered in light of the patient's goals of care.  I introduced the concept of a comfort path to [place] and encouraged her to think about at what point she would want to stop aggressive medical interventions and focus on quality of life and comfort rather than prolonging life. Introduced hospice philosophy and provided information on home vs residential hospice services.  [name] is tearful, but  agrees that hospice is the best plan of care for him at this point. She expresses that she does not want him to suffer.   Questions and concerns were addressed. [name] was provided with PMT contact information and encouraged to call with questions or concerns.   Primary decision maker: wife [name]    SUMMARY OF RECOMMENDATIONS    DNR/DNI as previously documented  Continue current medical care   Referral for hospice services - home versus residential - wife prefers [facility]  If patient is eligible, family would prefer him to go to [facility], but she is willing to care for him  at home if needed  PMT will continue to follow  Code Status/Advance Care Planning:  DNR  Symptom Management:   Per primary team  Palliative Prophylaxis:   Oral Care and Turn Reposition  Psycho-social/Spiritual:   Created space and opportunity for family to express thoughts and feelings regarding patient's current medical situation.   Emotional support provided   Prognosis:   poor  Discharge Planning: Home with hospice versus [facility] home of [facility]      Primary Diagnoses: Present on Admission: . Community acquired pneumonia . Acute metabolic encephalopathy . Permanent atrial fibrillation . GERD . Shortness of breath . Parkinson's disease ([place]) . Pneumonia   I have reviewed the medical record, interviewed the patient and family, and examined the patient. The following aspects are pertinent.  Past Medical History:  Diagnosis Date  . CAD (coronary artery disease) 09/2008  . Cancer ([place])    basal cell carcinoma  . CHF (congestive heart failure) ([place])   . Complication of anesthesia    06/12/11 anesthesia made legs asleep also and went into chf and ended up in ICU after gallbladder surgery  . Diastolic dysfunction    hx of chf due to fluid overload 9/12   . Dysrhythmia    atrial fibrillation  . GERD (gastroesophageal reflux disease)   . History of blood product transfusion    d/t bleeding ulcer  . Hypertension   . Macular degeneration (senile) of retina   . Persistent atrial fibrillation ([place])   . PONV (postoperative nausea and vomiting)   . Presence of permanent cardiac pacemaker 2010  . Shortness of breath dyspnea    exertion  . Sick sinus syndrome ([facility])    [employer]  . Syncope    resolved s/p PPM    Scheduled Meds: . amantadine  100 mg Oral BID  . apixaban  5 mg Oral BID  . carbidopa-levodopa  1 tablet Oral QID  . famotidine  20 mg Oral Daily  . furosemide  40 mg Intravenous Once  . memantine  5 mg Oral BID  . potassium chloride  40 mEq Oral  BID  . vitamin B-12  1,000 mcg Oral Daily   Continuous Infusions: . cefTRIAXone (ROCEPHIN)  IV 2 g (10/22/20 0931)  . doxycycline (VIBRAMYCIN) IV 100 mg (10/22/20 0939)  . magnesium sulfate bolus IVPB     PRN Meds:.acetaminophen **OR** acetaminophen, albuterol, Resource ThickenUp Clear Medications Prior to Admission:  Prior to Admission medications   Medication Sig Start Date End Date Taking? Authorizing Provider  amantadine (SYMMETREL) 100 MG capsule Take 1 capsule (100 mg total) by mouth 2 (two) times daily. 10/06/20  Yes Sater, Nanine Means, MD  carbidopa-levodopa (SINEMET) 25-100 MG tablet Take 1 tablet by mouth 4 (four) times daily. 07/14/20  Yes Sater, Nanine Means, MD  Cholecalciferol (VITAMIN D3) 50 MCG (2000 UT)  TABS Take 2,000 Units by mouth daily.   Yes [provider]  ELDERBERRY PO Take 500 mg by mouth daily.   Yes [provider]  ELIQUIS 5 MG TABS tablet TAKE 1 TABLET BY MOUTH TWICE A DAY Patient taking differently: Take 5 mg by mouth 2 (two) times daily. 01/25/20  Yes Allred, Jeneen Rinks, MD  famotidine (PEPCID) 20 MG tablet Take 20 mg by mouth daily.   Yes [provider]  Magnesium 250 MG TABS Take 250 mg by mouth every other day.    Yes [provider]  memantine (NAMENDA) 5 MG tablet Take 5 mg by mouth 2 (two) times daily. 05/25/19  Yes [provider]  triamcinolone cream (KENALOG) 0.1 % Apply 1 application topically daily as needed (For rash). 05/25/19  Yes [provider]  vitamin B-12 (CYANOCOBALAMIN) 1000 MCG tablet Take 1,000 mcg by mouth See admin instructions. Take one tablet by mouth on Monday,Wednesday and Fridays per spouse   Yes [provider]  Zinc 50 MG TABS Take 50 mg by mouth daily.   Yes [provider]   Allergies  Allergen Reactions  . Hydrocodone     "Made him feel bad"  . Hydroxyzine Anxiety   Review of Systems  Unable to perform ROS: Other    Physical Exam Vitals reviewed.   Constitutional:      General: He is not in acute distress.    Appearance: He is ill-appearing.  Cardiovascular:     Comments: Paced rhythm Pulmonary:     Effort: Tachypnea present.     Comments: Breathing mildly labored Skin:    General: Skin is warm and dry.  Neurological:     Mental Status: He is lethargic and confused.     Motor: Weakness present.     Vital Signs: BP (!) 148/91 (BP Location: Right Arm)   Pulse 68   Temp 98.6 F (37 C)   Resp 20   Ht '5\' 11"'  (1.803 m)   Wt 72.7 kg   SpO2 (!) 87%   BMI 22.35 kg/m  Pain Scale: 0-10   Pain Score: 0-No pain   SpO2: SpO2: (!) 87 % O2 Device:SpO2: (!) 87 % O2 Flow Rate: .O2 Flow Rate (L/min): 9 L/min  IO: Intake/output summary:   Intake/Output Summary (Last 24 hours) at 10/22/2020 1404 Last data filed at 10/22/2020 0900 Gross per 24 hour  Intake 814.46 ml  Output 1880 ml  Net -1065.54 ml    LBM: Last BM Date: 10/21/20 Baseline Weight: Weight: 70.1 kg Most recent weight: Weight: 72.7 kg      Palliative Assessment/Data: PPS 20%    Time In: 14:00 Time Out: 15:12 Time Total: 72 minutes Greater than 50%  of this time was spent counseling and coordinating care related to the above assessment and plan.  Signed by: Lavena Bullion, NP   Please contact Palliative Medicine Team phone at [phone] for questions and concerns.  For individual provider: See Shea Evans

## 2020-10-22 NOTE — Progress Notes (Signed)
  Echocardiogram 2D Echocardiogram has been performed.  Merrie Roof F 10/22/2020, 4:42 PM

## 2020-10-22 NOTE — Plan of Care (Signed)
  Problem: Activity: Goal: Ability to tolerate increased activity will improve Outcome: Progressing   Problem: Clinical Measurements: Goal: Ability to maintain a body temperature in the normal range will improve Outcome: Progressing   Problem: Respiratory: Goal: Ability to maintain adequate ventilation will improve Outcome: Progressing Goal: Ability to maintain a clear airway will improve Outcome: Progressing   

## 2020-10-22 NOTE — TOC Progression Note (Signed)
Transition of Care [facility]) - Progression Note    Patient Details  Name: [name] MRN: [id number] Date of Birth: Jul 30, 1931  Transition [facility] Care [facility]) CM/SW [place], [place] Phone Number: 10/22/2020, 11:14 AM  Clinical Narrative:    CSW spoke with pt's spouse by phone.  Pt's spouse would like HH vs SNF for pt's placement.  Pt's spouse has a list of [place] agencies.  Pt's spouse stated " he wants to come home".  Pt's spouse will also look into getting additional assistance during the day for pt. TOC Team will continue to assist for disposition planning.   Expected Discharge Plan: [place] Barriers to Discharge: Continued Medical Work up,Other (comment) ([employer] medicare needing [place])  Expected Discharge Plan and Services Expected Discharge Plan: [place] Choice: [place] arrangements for the past 2 months: Single Family Home                                       Social Determinants of Health (SDOH) Interventions    Readmission Risk Interventions No flowsheet data found.

## 2020-10-22 NOTE — Progress Notes (Incomplete)
PROGRESS NOTE    [name]  [id number] DOB: 26-Dec-1930 DOA: 09/24/2020 PCP: Ryan Gravel, MD   Chief complaint:  Shortness of breath  Brief Narrative:   Patient is a 87 y.o. male who presents from home with medical history significant for persistent atrial fibrillation chronically anticoagulated on Eliquis and complicated by sick sinus syndrome status post pacemaker placement, Parkinson's disease.  Patient reported to the ED after a 4-5 day period of shortness of breath with a subjective report of fever, and new onset of non-productive cough. Per patient's wife, patient developed confusion relative to his usual baseline and also generalized weakness requiring more assistance than usual. Patient denies chills or myalgia.  Denies chest pain, palpitations, or diaphoresis.  No reported nausea, vomiting, diarrhea, or hemoptysis.  Patient denies any recent travel, COVID-19 exposure, or sick contacts. Patient has no preceding trauma or surgeries.  Patient has no known pulmonary disease, reports a brief smoking history in the 1960's, but quit after that and does not have baseline supplemental oxygen requirements.  ED Course:  On admission, vital signs were notable for the following: Tmax 98.5, heart rate 56-60; blood pressure 121/43 - 124/78; respiratory rate 18-24; initial oxygen saturation noted to be in the mid 80s, which improved to 94 to 90% on 3 L nasal cannula. Patient is current on 4L with oxygen saturations, 90-91%.   Chest x-ray showed mixed patchy and coarse opacities in the bilateral lung suggestive of atypical pneumonia without evidence of edema, effusion, or pneumothorax.  EKG showed ventricular paced rhythm with ventricular rate 62, without evidence of acute ischemic changes.  Patient was empirically treated for suspected atypical community-acquired pneumonia with broad spectrum antibiotics zithromycin 500 mg IV x1, Rocephin 2 g IV x1, and a 500 cc normal saline bolus.      Assessment & Plan:   Principal Problem:   Community acquired pneumonia Active Problems:   GERD   Permanent atrial fibrillation   Shortness of breath   Parkinson's disease ([place])   Acute metabolic encephalopathy   Generalized weakness   Pneumonia   Pneumonia, suspected community acquired, with associated acute hypoxic respiratory distress, and generalized weakness -Patient diagnosed based on history of 4-5 day period of shortness of breath with a subjective report of fever, new onset of non-productive cough, chest X-ray 10/21/20 showed mixed patchy and coarse opacities in the lungs with a mid to lower lung and peripheral predominance, which could reflect an acute pneumonia including atypical viral etiologies such as COVID 19. - 10/22/20 Stable bilateral lung opacities are noted which may represent chronic lung disease, although acute superimposed inflammation or pneumonia cannot be excluded. -ABG on 10/18/20 showed hypoxemia with pO2 88. -COVID negative, patient afebrile -WBC's 9.6->11.6->12.5, procalcitonin <0.10, TSH 1.197, WNL -Patient reports no supplemental oxygen needs at home, required 3-4 L [place] to maintain oxygen saturations of 90-91 % in ED and shortly after admission -Likely aspiration event early am on 10/21/20 with increased oxygen needs to 5L immediately after aspiration event. -During ambulation to chair in afternoon on 10/21/20 became increasingly short of breath requiring 11 L HFNC to maintain oxygen saturations above 90 %. -10/22/20 Patient still requiring 11L HFNC in order to maintain oxygen saturations at 90-92 %. -Chest X-ray shows enlarged cardiac silhouette, comparable to prior exams but no other cardiac findings.  -EKG shows v paced rhythm without acute ischemic changes.  -Pending blood cultures x2, sputum cultures, and urine legionella and streptococcal antigen -Initially started on ceftriaxone and azithromycin, transitioned to ceftriaxone and doxycycline  100 mg, q12  hrs -Continue ceftriaxone and doxycycline 100 mg, q12 hrs -Incentive and flutter valve. -Monitor on continuous pulse oximetry. -PT evaluation, recommend SNF at discharge. -2D Echocardiogram -Palliative Care consulted due to multiple co-morbidities and poor prognosis.   Hypomagnesia -Replete with 2 g magnesium sulfate -Check BMP in am  Hypokalemia -Replete with 40 mEq Klor-Con -Check BMP in am  Dysphagia with association aspiration -Seen by SLP with a dyspahgia 2 diet with nectar thick liquids is recommended at this time -Continue aspiration precautions  Parkinson's Disease -Chronic,stable. Continue home medication carbidopa-levodopa and amantadine  Permanent atrial fibrillation -Chronically anticoagulated on Apixaban 5 mg BID  Sick-sinus syndrome, chronic, stable -Permanent pacemaker, St. Jude   GERD, chronic, stable -Continue home PPI   Note Details  Ryan Pulling, MD File Time 10/18/2020 2:03 PM  Author Type Physician Status Signed  Last Editor [name], [place] # [id number] Admit Date 10/12/2020     DVT prophylaxis: SCD's Code Status: DNR Family Communication:  Wife at bedside  Status is: Inpatient  Dispo:  Patient From: Home  Planned Disposition: Home with Health Care Svc  Expected discharge date: 10/22/2020  Medically stable for discharge: No   Body mass index is 22.35 kg/m.   Consultants:   None  Procedures:   None  Antimicrobials:   Doxycycline   Subjective:  Patient seen at bedside in the ED.  Not in acute distress but shows visible labored breathing.  States that it is difficult to breath today.   Review of Systems Otherwise negative except as per HPI, including: General: Denies fever, chills, night sweats or unintended weight loss. Resp: Denies cough, wheezing, endorses shortness of breath. Cardiac: Denies chest pain, palpitations, orthopnea, paroxysmal nocturnal dyspnea. GI: Denies  abdominal pain, nausea, vomiting, diarrhea or constipation GU: Denies dysuria, frequency, hesitancy or incontinence MS: Denies muscle aches, joint pain or swelling Neuro: Denies headache, neurologic deficits (focal weakness, numbness, tingling), abnormal gait Psych: Denies anxiety, depression, SI/HI/AVH Skin: Denies new rashes or lesions ID: Denies sick contacts, exotic exposures, travel  Examination:  General exam: Appears calm and comfortable  Respiratory system:  Upper lungs clear to auscultation. Crackles auscultated in bilateral lower bases. Respiratory effort labored. Cardiovascular system: S1 & S2 heard, RRR. No JVD, murmurs, rubs, gallops or clicks. No pedal edema. Gastrointestinal system: Abdomen is nondistended, soft and nontender. No organomegaly or masses felt. Normal bowel sounds heard. Central nervous system: Alert and oriented. No focal neurological deficits. Extremities: Symmetric 5 x 5 power. Skin: No rashes, lesions or ulcers Psychiatry: Judgement and insight appear normal. Mood & affect appropriate.     Objective: Vitals:   10/21/20 1759 10/21/20 2132 10/22/20 0528 10/22/20 0800  BP:  109/61 92/79   Pulse:  61 65 66  Resp:  19 20   Temp:  98.5 F (36.9 C) 98.6 F (37 C)   TempSrc:  Oral Oral   SpO2: 90% 90% 92% 91%  Weight:      Height:        Intake/Output Summary (Last 24 hours) at 10/22/2020 1119 Last data filed at 10/22/2020 0900 Gross per 24 hour  Intake 814.46 ml  Output 1880 ml  Net -1065.54 ml   Filed Weights   10/19/20 1415 10/20/20 0500  Weight: 70.1 kg 72.7 kg     Data Reviewed:   CBC: Recent Labs  Lab 10/09/2020 2300 10/18/20 0809 10/19/20 0452 10/22/20 0141  WBC 9.6  --  11.6* 12.5*  NEUTROABS  --   --  9.2* 10.1*  HGB 13.8 14.3 13.0 12.7*  HCT 42.9 42.0 40.4 36.7*  MCV 92.7  --  96.0 90.8  PLT 238  --  197 0000000   Basic Metabolic Panel: Recent Labs  Lab 10/11/2020 2300 10/18/20 0656 10/18/20 0809 10/19/20 0452  10/22/20 0141  NA 140  --  142 139 141  K 3.6  --  4.3 4.0 3.2*  CL 105  --   --  109 107  CO2 22  --   --  19* 21*  GLUCOSE 106*  --   --  106* 126*  BUN 16  --   --  14 12  CREATININE 0.87  --   --  0.74 0.80  CALCIUM 8.4*  --   --  8.0* 8.2*  MG  --  2.2  --  1.9 1.7  PHOS  --  4.1  --  3.6 3.8   GFR: Estimated Creatinine Clearance: 66.9 mL/min (by C-G formula based on SCr of 0.8 mg/dL). Liver Function Tests: No results for input(s): AST, ALT, ALKPHOS, BILITOT, PROT, ALBUMIN in the last 168 hours. No results for input(s): LIPASE, AMYLASE in the last 168 hours. No results for input(s): AMMONIA in the last 168 hours. Coagulation Profile: No results for input(s): INR, PROTIME in the last 168 hours. Cardiac Enzymes: No results for input(s): CKTOTAL, CKMB, CKMBINDEX, TROPONINI in the last 168 hours. BNP (last 3 results) No results for input(s): PROBNP in the last 8760 hours. HbA1C: No results for input(s): HGBA1C in the last 72 hours. CBG: No results for input(s): GLUCAP in the last 168 hours. Lipid Profile: No results for input(s): CHOL, HDL, LDLCALC, TRIG, CHOLHDL, LDLDIRECT in the last 72 hours. Thyroid Function Tests: No results for input(s): TSH, T4TOTAL, FREET4, T3FREE, THYROIDAB in the last 72 hours. Anemia Panel: No results for input(s): VITAMINB12, FOLATE, FERRITIN, TIBC, IRON, RETICCTPCT in the last 72 hours. Sepsis Labs: Recent Labs  Lab 10/18/20 0656  PROCALCITON <0.10    Recent Results (from the past 240 hour(s))  SARS Coronavirus 2 by RT PCR (hospital order, performed in [facility] hospital lab) Nasopharyngeal Nasopharyngeal Swab     Status: None   Collection Time: 10/11/2020 11:03 PM   Specimen: Nasopharyngeal Swab  Result Value Ref Range Status   SARS Coronavirus 2 NEGATIVE NEGATIVE Final    Comment: (NOTE) SARS-CoV-2 target nucleic acids are NOT DETECTED.  The SARS-CoV-2 RNA is generally detectable in upper and lower respiratory specimens during the  acute phase of infection. The lowest concentration of SARS-CoV-2 viral copies this assay can detect is 250 copies / mL. A negative result does not preclude SARS-CoV-2 infection and should not be used as the sole basis for treatment or other patient management decisions.  A negative result may occur with improper specimen collection / handling, submission of specimen other than nasopharyngeal swab, presence of viral mutation(s) within the areas targeted by this assay, and inadequate number of viral copies (<250 copies / mL). A negative result must be combined with clinical observations, patient history, and epidemiological information.  Fact Sheet for Patients:   StrictlyIdeas.no  Fact Sheet for Healthcare Providers: BankingDealers.co.za  This test is not yet approved or  cleared by the [place] FDA and has been authorized for detection and/or diagnosis of SARS-CoV-2 by FDA under an Emergency Use Authorization (EUA).  This EUA will remain in effect (meaning this test can be used) for the duration of the COVID-19 declaration under Section 564(b)(1) of the Act, 21 U.S.C.  section 360bbb-3(b)(1), unless the authorization is terminated or revoked sooner.  Performed at [facility], [place] [address], [place], [place] [address]   Culture, blood (Routine X 2) w Reflex to ID Panel     Status: None (Preliminary result)   Collection Time: 10/18/20  6:22 AM   Specimen: BLOOD RIGHT FOREARM  Result Value Ref Range Status   Specimen Description BLOOD RIGHT FOREARM  Final   Special Requests   Final    BOTTLES DRAWN AEROBIC AND ANAEROBIC Blood Culture results may not be optimal due to an inadequate volume of blood received in culture bottles   Culture   Final    NO GROWTH 4 DAYS Performed at [facility], [place] [address], [place], [place] [address]    Report Status PENDING  Incomplete  Culture, blood (Routine X 2) w Reflex to ID Panel      Status: None (Preliminary result)   Collection Time: 10/18/20  1:52 PM   Specimen: BLOOD  Result Value Ref Range Status   Specimen Description BLOOD SITE NOT SPECIFIED  Final   Special Requests   Final    BOTTLES DRAWN AEROBIC AND ANAEROBIC Blood Culture results may not be optimal due to an inadequate volume of blood received in culture bottles   Culture   Final    NO GROWTH 4 DAYS Performed at [facility], [place] [address], [place], [place] [address]    Report Status PENDING  Incomplete  Urine culture     Status: None   Collection Time: 10/18/20  5:04 PM   Specimen: Urine, Catheterized  Result Value Ref Range Status   Specimen Description URINE, CATHETERIZED  Final   Special Requests NONE  Final   Culture   Final    NO GROWTH Performed at [facility], [address], [place], [place] [address]    Report Status 10/20/2020 FINAL  Final         Radiology Studies: CT HEAD WO CONTRAST  Result Date: 10/21/2020 CLINICAL DATA:  Altered mental status, confusion EXAM: CT HEAD WITHOUT CONTRAST TECHNIQUE: Contiguous axial images were obtained from the base of the skull through the vertex without intravenous contrast. COMPARISON:  09/16/2020 FINDINGS: Brain: Normal anatomic configuration. Parenchymal volume loss is commensurate with the patient's age. Extensive subcortical and periventricular white matter changes are present likely reflecting the sequela of small vessel ischemia. Remote lacunar infarcts are noted within the insular cortices bilaterally as well as the anterior limb of the right internal capsule no abnormal intra or extra-axial mass lesion or fluid collection. No abnormal mass effect or midline shift. No evidence of acute intracranial hemorrhage or infarct. Ventricular size is normal. Cerebellum unremarkable. Vascular: No asymmetric hyperdense vasculature at the skull base. Skull: Intact Sinuses/Orbits: Small air-fluid level noted within the right maxillary sinus.  Debris noted within the left maxillary sinus and sphenoid sinuses. Remaining paranasal sinuses are clear. Orbits are unremarkable. Other: Mastoid air cells and middle ear cavities are clear. IMPRESSION: Stable extensive senescent change.  Stable remote infarcts. No evidence of acute intracranial hemorrhage or infarct. Mild bilateral paranasal sinus disease, mildly progressive since prior examination. Electronically Signed   By: Fidela Salisbury MD   On: 10/21/2020 10:16   DG CHEST PORT 1 VIEW  Result Date: 10/22/2020 CLINICAL DATA:  Pneumonia. EXAM: PORTABLE CHEST 1 VIEW COMPARISON:  October 21, 2020. FINDINGS: Stable cardiomediastinal silhouette. Left-sided pacemaker is unchanged in position. Stable bilateral lung opacities are noted which may represent  chronic lung disease, although acute superimposed inflammation or pneumonia cannot be excluded. No pneumothorax or pleural effusion is noted. Bony thorax is unremarkable. IMPRESSION: Stable bilateral lung opacities are noted which may represent chronic lung disease, although acute superimposed inflammation or pneumonia cannot be excluded. Electronically Signed   By: Marijo Conception M.D.   On: 10/22/2020 09:35   DG CHEST PORT 1 VIEW  Result Date: 10/21/2020 CLINICAL DATA:  Airway aspiration EXAM: PORTABLE CHEST 1 VIEW COMPARISON:  Yesterday FINDINGS: Chronic lung disease based on remote imaging. There is also acute appearing superimposed reticular opacity which is bilateral (comparing 2020 with admission radiographs). No visible effusion or pneumothorax. Cardiomegaly. Stable positioning of dual-chamber pacer leads from the left. No visible effusion or pneumothorax. IMPRESSION: Unchanged acute on chronic pulmonary opacification. Interval aspiration could easily be obscured by the extent of pre-existing disease. Electronically Signed   By: Monte Fantasia M.D.   On: 10/21/2020 05:01   DG Swallowing Func-Speech Pathology  Result Date: 10/21/2020 Objective  Swallowing Evaluation: Type of Study: MBS-Modified Barium Swallow Study  Patient Details Name: TAZ VANNESS MRN: [id number] Date of Birth: 06-Oct-1930 Today's Date: 10/21/2020 Time: SLP Start Time (ACUTE ONLY): 1010 -SLP Stop Time (ACUTE ONLY): 1030 SLP Time Calculation (min) (ACUTE ONLY): 20 min Past Medical History: Past Medical History: Diagnosis Date . CAD (coronary artery disease) 09/2008 . Cancer ([place])   basal cell carcinoma . CHF (congestive heart failure) ([place])  . Complication of anesthesia   06/12/11 anesthesia made legs asleep also and went into chf and ended up in ICU after gallbladder surgery . Diastolic dysfunction   hx of chf due to fluid overload 9/12  . Dysrhythmia   atrial fibrillation . GERD (gastroesophageal reflux disease)  . History of blood product transfusion   d/t bleeding ulcer . Hypertension  . Macular degeneration (senile) of retina  . Persistent atrial fibrillation ([place])  . PONV (postoperative nausea and vomiting)  . Presence of permanent cardiac pacemaker 2010 . Shortness of breath dyspnea   exertion . Sick sinus syndrome ([facility])   St. Jude . Syncope   resolved s/p PPM Past Surgical History: Past Surgical History: Procedure Laterality Date . CHOLECYSTECTOMY  06/12/11 . INGUINAL HERNIA REPAIR Left 10/06/2014  Procedure: OPEN REPAIR LEFT INGUINAL HERNIA ;  Surgeon: Fanny Skates, MD;  Location: [facility];  Service: General;  Laterality: Left; . INSERTION OF MESH Left 10/06/2014  Procedure: INSERTION OF MESH;  Surgeon: Fanny Skates, MD;  Location: [facility];  Service: General;  Laterality: Left; . IR KYPHO LUMBAR INC FX REDUCE BONE BX UNI/BIL CANNULATION INC/IMAGING  12/26/2017 . IR RADIOLOGIST EVAL & MGMT  12/12/2017 . LAPAROSCOPIC CHOLECYSTECTOMY W/ CHOLANGIOGRAPHY  06/12/2011 . PACEMAKER INSERTION  09/28/08  SJM [place] XL DR . perforated ulcer   . PPM GENERATOR CHANGEOUT N/A 10/21/2018  Procedure: PPM GENERATOR CHANGEOUT;  Surgeon: Thompson Grayer, MD;  Location: [facility];  Service: Cardiovascular;   Laterality: N/A; . right inguinal hernia repair  11/19/2002  with mesh . TRANSURETHRAL RESECTION OF BLADDER TUMOR  09/06/2011  Procedure: TRANSURETHRAL RESECTION OF BLADDER TUMOR (TURBT);  Surgeon: Ailene Rud, MD;  Location: [facility];  Service: Urology;  Laterality: N/A; . TRANSURETHRAL RESECTION OF PROSTATE  78/58/8502 . umblical hernia  7/74/1287 HPI: Pt is an 87 y.o. male with medical history significant for persistent atrial fibrillation chronically anticoagulated on Eliquis and complicated by sick sinus syndrome status post pacemaker placement, Parkinson's disease, who presented to the ED with c/o shortness of breath and  was admitted on 09/24/2020 with suspected community-acquired pneumonia. Pt demonstrated a coughing episode with water on 2/4 with subsequent desatting to 81% and recovery to 91-93% after increased HOB and use of 8L HFNC. CXR: acute on chronic pulmonary opacification. Interval aspiration could easily be obscured by the extent of pre-existing disease. SLP was consulted secondary to event on 2/4.  No data recorded Assessment / Plan / Recommendation CHL IP CLINICAL IMPRESSIONS 10/21/2020 Clinical Impression Pt presents with oropharyngeal dysphagia characterized by impaired mastication, reduced bolus cohesion, impaired bolus propulsion, reduced lingual retraction, and a pharyngeal delay. Pt demonstrated prolonged and vigorous mastication of solids which persisted beyond the point of necessity; pt intermittently required cueing to swallow dysphagia 3 solids. He exhibited lingual rocking, and premature spillage with liquids. Mild to moderate vallecular residue was noted depending on bolus size. Residue was reduced, but not eliminted, with a liquid wash. When residue was moderate, nectar thick liquids were observed to prematurely spill over the residue and result in penetration (PAS 3) with subsequent aspiration (PAS 7,8). Penetration (PAS 3) and aspiration (PAS 7,8) were observed with thin  liquids. This was not eliminated with reduced bolus sizes and he was unable to demonstrate other compensatory strategies, despite cueing. Coughing was inconsistent following aspiration events and ineffective. A dyspahgia 2 diet with nectar thick liquids is recommended at this time. SLP will follow for dysphagia treatment. SLP Visit Diagnosis Dysphagia, oropharyngeal phase (R13.12) Attention and concentration deficit following -- Frontal lobe and executive function deficit following -- Impact on safety and function Mild aspiration risk   CHL IP TREATMENT RECOMMENDATION 10/21/2020 Treatment Recommendations Therapy as outlined in treatment plan below   Prognosis 10/21/2020 Prognosis for Safe Diet Advancement Good Barriers to Reach Goals Cognitive deficits Barriers/Prognosis Comment -- CHL IP DIET RECOMMENDATION 10/21/2020 SLP Diet Recommendations Dysphagia 2 (Fine chop) solids;Nectar thick liquid Liquid Administration via Cup;No straw Medication Administration Crushed with puree Compensations Slow rate;Small sips/bites;Follow solids with liquid Postural Changes Seated upright at 90 degrees   CHL IP OTHER RECOMMENDATIONS 10/21/2020 Recommended Consults -- Oral Care Recommendations Oral care BID Other Recommendations Order thickener from pharmacy   CHL IP FOLLOW UP RECOMMENDATIONS 10/21/2020 Follow up Recommendations Skilled Nursing facility   [facility] IP FREQUENCY AND DURATION 10/21/2020 Speech Therapy Frequency (ACUTE ONLY) min 2x/week Treatment Duration 2 weeks      CHL IP ORAL PHASE 10/21/2020 Oral Phase Impaired Oral - Pudding Teaspoon -- Oral - Pudding Cup -- Oral - Honey Teaspoon -- Oral - Honey Cup Premature spillage;Decreased bolus cohesion Oral - Nectar Teaspoon -- Oral - Nectar Cup Premature spillage;Decreased bolus cohesion;Reduced posterior propulsion;Lingual pumping Oral - Nectar Straw Premature spillage;Decreased bolus cohesion;Lingual pumping;Reduced posterior propulsion Oral - Thin Teaspoon Premature spillage;Decreased  bolus cohesion Oral - Thin Cup Premature spillage;Decreased bolus cohesion;Reduced posterior propulsion;Lingual pumping Oral - Thin Straw Premature spillage;Decreased bolus cohesion;Lingual pumping;Reduced posterior propulsion Oral - Puree Premature spillage;Decreased bolus cohesion Oral - Mech Soft WFL Oral - Regular -- Oral - Multi-Consistency -- Oral - Pill -- Oral Phase - Comment --  CHL IP PHARYNGEAL PHASE 10/21/2020 Pharyngeal Phase Impaired Pharyngeal- Pudding Teaspoon -- Pharyngeal -- Pharyngeal- Pudding Cup -- Pharyngeal -- Pharyngeal- Honey Teaspoon -- Pharyngeal -- Pharyngeal- Honey Cup Reduced tongue base retraction;Pharyngeal residue - valleculae;Pharyngeal residue - pyriform;Reduced anterior laryngeal mobility Pharyngeal Material does not enter airway Pharyngeal- Nectar Teaspoon -- Pharyngeal -- Pharyngeal- Nectar Cup Reduced tongue base retraction;Pharyngeal residue - valleculae;Pharyngeal residue - pyriform;Reduced anterior laryngeal mobility;Penetration/Aspiration during swallow;Penetration/Apiration after swallow Pharyngeal -- Pharyngeal- Nectar Straw Reduced tongue  base retraction;Pharyngeal residue - valleculae;Pharyngeal residue - pyriform;Reduced anterior laryngeal mobility;Penetration/Aspiration during swallow;Penetration/Apiration after swallow Pharyngeal Material enters airway, CONTACTS cords and then ejected out;Material enters airway, passes BELOW cords without attempt by patient to eject out (silent aspiration) Pharyngeal- Thin Teaspoon Reduced tongue base retraction;Pharyngeal residue - valleculae;Pharyngeal residue - pyriform;Reduced anterior laryngeal mobility;Penetration/Aspiration during swallow;Penetration/Apiration after swallow Pharyngeal Material enters airway, CONTACTS cords and not ejected out Pharyngeal- Thin Cup Reduced tongue base retraction;Pharyngeal residue - valleculae;Pharyngeal residue - pyriform;Reduced anterior laryngeal mobility;Penetration/Aspiration during  swallow;Penetration/Apiration after swallow Pharyngeal Material enters airway, CONTACTS cords and not ejected out;Material enters airway, passes BELOW cords without attempt by patient to eject out (silent aspiration);Material enters airway, passes BELOW cords and not ejected out despite cough attempt by patient Pharyngeal- Thin Straw Reduced tongue base retraction;Pharyngeal residue - valleculae;Pharyngeal residue - pyriform;Reduced anterior laryngeal mobility;Penetration/Aspiration during swallow;Penetration/Apiration after swallow Pharyngeal Material enters airway, CONTACTS cords and not ejected out;Material enters airway, passes BELOW cords and not ejected out despite cough attempt by patient;Material enters airway, passes BELOW cords without attempt by patient to eject out (silent aspiration) Pharyngeal- Puree Reduced tongue base retraction;Pharyngeal residue - valleculae;Pharyngeal residue - pyriform;Reduced anterior laryngeal mobility Pharyngeal -- Pharyngeal- Mechanical Soft Reduced tongue base retraction;Pharyngeal residue - valleculae;Pharyngeal residue - pyriform;Reduced anterior laryngeal mobility Pharyngeal -- Pharyngeal- Regular -- Pharyngeal -- Pharyngeal- Multi-consistency -- Pharyngeal -- Pharyngeal- Pill Reduced tongue base retraction;Pharyngeal residue - valleculae;Pharyngeal residue - pyriform;Reduced anterior laryngeal mobility Pharyngeal -- Pharyngeal Comment --  CHL IP CERVICAL ESOPHAGEAL PHASE 10/21/2020 Cervical Esophageal Phase WFL Pudding Teaspoon -- Pudding Cup -- Honey Teaspoon -- Honey Cup -- Nectar Teaspoon -- Nectar Cup -- Nectar Straw -- Thin Teaspoon -- Thin Cup -- Thin Straw -- Puree -- Mechanical Soft -- Regular -- Multi-consistency -- Pill -- Cervical Esophageal Comment -- Shanika I. Hardin Negus, [place], [place] Office number [phone] Pager [place] 10/21/2020, 12:04 PM                   Scheduled Meds: . amantadine  100 mg  Oral BID  . apixaban  5 mg Oral BID  . carbidopa-levodopa  1 tablet Oral QID  . famotidine  20 mg Oral Daily  . furosemide  40 mg Intravenous Once  . memantine  5 mg Oral BID  . potassium chloride  40 mEq Oral BID  . vitamin B-12  1,000 mcg Oral Daily   Continuous Infusions: . cefTRIAXone (ROCEPHIN)  IV 2 g (10/22/20 0931)  . doxycycline (VIBRAMYCIN) IV 100 mg (10/22/20 0939)  . magnesium sulfate bolus IVPB       LOS: 4 days   Time spent= 35 mins    Dede Query, RN NP-Student   If 7PM-7AM, please contact night-coverage  10/22/2020, 11:19 AM

## 2020-10-23 DIAGNOSIS — G9341 Metabolic encephalopathy: Secondary | ICD-10-CM | POA: Diagnosis not present

## 2020-10-23 DIAGNOSIS — G20 Parkinson's disease: Secondary | ICD-10-CM | POA: Diagnosis not present

## 2020-10-23 DIAGNOSIS — R531 Weakness: Secondary | ICD-10-CM | POA: Diagnosis not present

## 2020-10-23 DIAGNOSIS — J189 Pneumonia, unspecified organism: Secondary | ICD-10-CM | POA: Diagnosis not present

## 2020-10-23 LAB — CULTURE, BLOOD (ROUTINE X 2)
Culture: NO GROWTH
Culture: NO GROWTH

## 2020-10-23 MED ORDER — MORPHINE SULFATE (CONCENTRATE) 10 MG/0.5ML PO SOLN
5.0000 mg | ORAL | Status: DC
  Administered 2020-10-23: 5 mg via SUBLINGUAL
  Filled 2020-10-23 (×2): qty 0.5

## 2020-10-23 MED ORDER — FUROSEMIDE 10 MG/ML IJ SOLN
40.0000 mg | Freq: Once | INTRAMUSCULAR | Status: AC
  Administered 2020-10-23: 40 mg via INTRAVENOUS
  Filled 2020-10-23: qty 4

## 2020-10-23 MED ORDER — ONDANSETRON HCL 4 MG/2ML IJ SOLN: 4.0000 mg | Freq: Four times a day (QID) | INTRAMUSCULAR | Status: DC | PRN

## 2020-10-23 MED ORDER — GLYCOPYRROLATE 0.2 MG/ML IJ SOLN: 0.2000 mg | INTRAMUSCULAR | Status: DC | PRN

## 2020-10-23 MED ORDER — LORAZEPAM 1 MG PO TABS: 1.0000 mg | ORAL_TABLET | ORAL | Status: DC | PRN

## 2020-10-23 MED ORDER — LORAZEPAM 2 MG/ML PO CONC: 1.0000 mg | ORAL | Status: DC | PRN

## 2020-10-23 MED ORDER — ONDANSETRON 4 MG PO TBDP: 4.0000 mg | ORAL_TABLET | Freq: Four times a day (QID) | ORAL | Status: DC | PRN

## 2020-10-23 MED ORDER — LORAZEPAM 2 MG/ML IJ SOLN
1.0000 mg | INTRAMUSCULAR | Status: DC | PRN
  Administered 2020-10-23: 1 mg via INTRAVENOUS
  Filled 2020-10-23: qty 1

## 2020-10-23 MED ORDER — MORPHINE SULFATE (PF) 2 MG/ML IV SOLN
1.0000 mg | INTRAVENOUS | Status: DC | PRN
  Administered 2020-10-23 – 2020-10-24 (×4): 1 mg via INTRAVENOUS
  Filled 2020-10-23 (×4): qty 1

## 2020-10-23 MED ORDER — POLYVINYL ALCOHOL 1.4 % OP SOLN
1.0000 [drp] | Freq: Four times a day (QID) | OPHTHALMIC | Status: DC | PRN
Start: 2020-10-23 — End: 2020-10-24
  Filled 2020-10-23: qty 15

## 2020-10-23 MED ORDER — GLYCOPYRROLATE 1 MG PO TABS: 1.0000 mg | ORAL_TABLET | ORAL | Status: DC | PRN

## 2020-10-23 MED ORDER — BIOTENE DRY MOUTH MT LIQD: 15.0000 mL | OROMUCOSAL | Status: DC | PRN

## 2020-10-23 NOTE — Progress Notes (Signed)
PROGRESS NOTE    [name]  QDU:[id number] DOB: 01-Mar-1931 DOA: 10/07/2020 PCP: Pearson Grippe, MD    Brief Narrative:  Patient is a 87 y.o.male who presents from homewith medical history significant forpersistent atrial fibrillation chronically anticoagulated on Eliquis and complicated by sick sinus syndrome status post pacemaker placement, Parkinson's disease.  Patient reported to the ED after a 4-5 day period of shortness of breath with a subjective report of fever, and new onset of non-productive cough. Per patient's wife, patient developed confusion relative to his usual baseline and also generalized weakness requiring more assistance than usual. Patient denies chills or myalgia.  Denies chest pain, palpitations, or diaphoresis.  No reported nausea, vomiting, diarrhea, or hemoptysis.  Patient denies any recent travel, COVID-19 exposure, or sick contacts. Patient has no preceding trauma or surgeries.  Patient has no known pulmonary disease, reports a brief smoking history in the 1960's, but quit after that and does not have baseline supplemental oxygen requirements.  ED Course:  On admission, vital signs were notable for the following: Tmax 98.5, heart rate 56-60; blood pressure121/43 - 124/78; respiratory rate 18-24; initial oxygen saturation noted to be in the mid 80s, which improved to 94 to 90% on 3 L nasal cannula.Patient is current on 4L with oxygen saturations, 90-91%.   Chest x-ray showed mixed patchy and coarse opacities in the bilateral lung suggestive of atypical pneumonia without evidence of edema, effusion, or pneumothorax. EKG showed ventricular paced rhythm with ventricular rate 62, without evidence of acute ischemic changes.  Patient was empirically treated for suspected atypical community-acquired pneumonia with broad spectrum antibiotics zithromycin 500 mg IV x1, Rocephin 2 g IV x1, and a 500 cc normal saline bolus.   Assessment & Plan:   Principal Problem:    Community acquired pneumonia Active Problems:   GERD   Permanent atrial fibrillation   Shortness of breath   Parkinson's disease (HCC)   Acute metabolic encephalopathy   Generalized weakness   Pneumonia  Community-acquired pneumonia with hypoxia: Also suspect aspiration pneumonia. Patient probably has underlying fibrotic lung disease, undiagnosed.  Probable chronic recurrent aspiration. Treating with antibiotics, currently on Rocephin and doxycycline day 5.   Patient is not improving appropriately despite optimal medical treatment.   His oxygen requirement has increased, repeat chest x-ray with persistent bilateral pneumonia, atelectasis and fibrosis.   Continue current level of care.     Chest physiotherapy, incentive spirometry, deep breathing exercises, sputum induction, mucolytic's and bronchodilators. All cultures are negative so far. Supplemental oxygen to keep saturations more than 90%. Start mobilizing with PT OT. COVID-19 test was negative x2.   Echocardiogram with ejection fraction 45%.  We'll try intermittent Lasix.  Replace potassium. We'll continue to have goal of care discussion.   Hypokalemia/hypomagnesemia: We will replace and monitor levels.  Recheck level tomorrow morning.  Acute urinary retention: Patient has history of BPH and frequent urinary retention in the past especially associated with hospitalization and sedation. Foley catheter had to be placed on 2/1.  Successful voiding trial 2/2.  Will monitor. Urinalysis with no evidence of infection.  Parkinson's disease: Stable.  Continue carbidopa levodopa and amantadine.  Permanent A. fib and sick sinus syndrome status post pacemaker: Stable.  Rate controlled.  Therapeutic on Eliquis.  GERD: On PPI.  Goal of care:  Patient not doing appropriate clinical response despite maximal medical therapy.   Is profoundly debilitated with his age-related frailty and underlying Parkinson's disease.   High risk of  aspiration and continues to have intermittent  aspiration.   I have discussed with patient's wife multiple times and she is aware that he is not improving well.   DNR/DNI in place. Given no appropriate improvement, I'm afraid that he may not be able to improve and discharge from the hospital. Palliative care following. Will allow patient's niece to visit him in the hospital to discuss about comfort care and hospice.    DVT prophylaxis:  apixaban (ELIQUIS) tablet 5 mg   Code Status: DNR  Family Communication: Wife at the bedside. Disposition Plan: Status is: Inpatient  Remains inpatient appropriate because:Inpatient level of care appropriate due to severity of illness   Dispo:  Patient From: Home  Planned Disposition: Home with Health Care [facility] versus SNF.  May need to go to hospice.  Expected discharge date: Unknown.  Medically stable for discharge: No           Consultants:   Palliative team  Procedures:   None  Antimicrobials:  Antibiotics Given (last 72 hours)    Date/Time Action Medication Dose Rate   10/20/20 2121 Given   doxycycline (VIBRA-TABS) tablet 100 mg 100 mg    10/21/20 0800 New Bag/Given   cefTRIAXone (ROCEPHIN) 2 g in sodium chloride 0.9 % 100 mL IVPB 2 g 200 mL/hr   10/21/20 1419 New Bag/Given   doxycycline (VIBRAMYCIN) 100 mg in sodium chloride 0.9 % 250 mL IVPB 100 mg 125 mL/hr   10/21/20 2043 New Bag/Given   doxycycline (VIBRAMYCIN) 100 mg in sodium chloride 0.9 % 250 mL IVPB 100 mg 125 mL/hr   10/22/20 0931 New Bag/Given   cefTRIAXone (ROCEPHIN) 2 g in sodium chloride 0.9 % 100 mL IVPB 2 g 200 mL/hr   10/22/20 [id number] New Bag/Given   doxycycline (VIBRAMYCIN) 100 mg in sodium chloride 0.9 % 250 mL IVPB 100 mg 125 mL/hr   10/22/20 2234 New Bag/Given   doxycycline (VIBRAMYCIN) 100 mg in sodium chloride 0.9 % 250 mL IVPB 100 mg 125 mL/hr       Subjective: Patient seen and examined.  Overnight events noted.  Has increasing oxygen requirement.   Had intermittent confusion. On interview, patient himself recognizes this provider, he greets me back and states he is feeling well. Wife reported that he did not eat well today.  She reported that he had some confusion.  Objective: Vitals:   10/22/20 1700 10/22/20 2145 10/23/20 0348 10/23/20 0546  BP:  114/77  131/85  Pulse:  (!) 59 (!) 59 63  Resp:  16 16 16   Temp:  98.5 F (36.9 C)  99.4 F (37.4 C)  TempSrc:  Oral  Oral  SpO2: 92% 91% 90% 93%  Weight:      Height:        Intake/Output Summary (Last 24 hours) at 10/23/2020 1058 Last data filed at 10/23/2020 0400 Gross per 24 hour  Intake 1200 ml  Output 1450 ml  Net -250 ml   Filed Weights   10/19/20 1415 10/20/20 0500  Weight: 70.1 kg 72.7 kg    Examination:  General exam: Appears in mild distress.  Chronically sick looking.  Debilitated. SpO2: 93 % O2 Flow Rate (L/min): 15 L/min  Respiratory system: Poor air entry bilateral.  Conducted airway sounds. Cardiovascular system: S1 & S2 heard, RRR.  Pacemaker in place.  Nontender. Gastrointestinal system: Abdomen is nondistended, soft and nontender. No organomegaly or masses felt. Normal bowel sounds heard. Central nervous system: Alert and oriented x2.  He is slow to respond.  Looks lethargic and tired. Extremities:  Symmetric 5 x 5 power.  Generalized weakness. He has ecchymosis on his skin.    Data Reviewed: I have personally reviewed following labs and imaging studies  CBC: Recent Labs  Lab 11/12/20 2300 10/18/20 0809 10/19/20 0452 10/22/20 0141  WBC 9.6  --  11.6* 12.5*  NEUTROABS  --   --  9.2* 10.1*  HGB 13.8 14.3 13.0 12.7*  HCT 42.9 42.0 40.4 36.7*  MCV 92.7  --  96.0 90.8  PLT 238  --  197 932   Basic Metabolic Panel: Recent Labs  Lab 11/12/20 2300 10/18/20 0656 10/18/20 0809 10/19/20 0452 10/22/20 0141  NA 140  --  142 139 141  K 3.6  --  4.3 4.0 3.2*  CL 105  --   --  109 107  CO2 22  --   --  19* 21*  GLUCOSE 106*  --   --  106* 126*   BUN 16  --   --  14 12  CREATININE 0.87  --   --  0.74 0.80  CALCIUM 8.4*  --   --  8.0* 8.2*  MG  --  2.2  --  1.9 1.7  PHOS  --  4.1  --  3.6 3.8   GFR: Estimated Creatinine Clearance: 66.9 mL/min (by C-G formula based on SCr of 0.8 mg/dL). Liver Function Tests: No results for input(s): AST, ALT, ALKPHOS, BILITOT, PROT, ALBUMIN in the last 168 hours. No results for input(s): LIPASE, AMYLASE in the last 168 hours. No results for input(s): AMMONIA in the last 168 hours. Coagulation Profile: No results for input(s): INR, PROTIME in the last 168 hours. Cardiac Enzymes: No results for input(s): CKTOTAL, CKMB, CKMBINDEX, TROPONINI in the last 168 hours. BNP (last 3 results) No results for input(s): PROBNP in the last 8760 hours. HbA1C: No results for input(s): HGBA1C in the last 72 hours. CBG: No results for input(s): GLUCAP in the last 168 hours. Lipid Profile: No results for input(s): CHOL, HDL, LDLCALC, TRIG, CHOLHDL, LDLDIRECT in the last 72 hours. Thyroid Function Tests: No results for input(s): TSH, T4TOTAL, FREET4, T3FREE, THYROIDAB in the last 72 hours. Anemia Panel: No results for input(s): VITAMINB12, FOLATE, FERRITIN, TIBC, IRON, RETICCTPCT in the last 72 hours. Sepsis Labs: Recent Labs  Lab 10/18/20 0656  PROCALCITON <0.10    Recent Results (from the past 240 hour(s))  SARS Coronavirus 2 by RT PCR (hospital order, performed in [facility] hospital lab) Nasopharyngeal Nasopharyngeal Swab     Status: None   Collection Time: 11/12/20 11:03 PM   Specimen: Nasopharyngeal Swab  Result Value Ref Range Status   SARS Coronavirus 2 NEGATIVE NEGATIVE Final    Comment: (NOTE) SARS-CoV-2 target nucleic acids are NOT DETECTED.  The SARS-CoV-2 RNA is generally detectable in upper and lower respiratory specimens during the acute phase of infection. The lowest concentration of SARS-CoV-2 viral copies this assay can detect is 250 copies / mL. A negative result does not preclude  SARS-CoV-2 infection and should not be used as the sole basis for treatment or other patient management decisions.  A negative result may occur with improper specimen collection / handling, submission of specimen other than nasopharyngeal swab, presence of viral mutation(s) within the areas targeted by this assay, and inadequate number of viral copies (<250 copies / mL). A negative result must be combined with clinical observations, patient history, and epidemiological information.  Fact Sheet for Patients:   StrictlyIdeas.no  Fact Sheet for Healthcare Providers: BankingDealers.co.za  This test  is not yet approved or  cleared by the [place] and has been authorized for detection and/or diagnosis of SARS-CoV-2 by FDA under an Emergency Use Authorization (EUA).  This EUA will remain in effect (meaning this test can be used) for the duration of the COVID-19 declaration under Section 564(b)(1) of the Act, 21 U.S.C. section 360bbb-3(b)(1), unless the authorization is terminated or revoked sooner.  Performed at [facility], [place] [address] [place], [place] [address]   Culture, blood (Routine X 2) w Reflex to ID Panel     Status: None (Preliminary result)   Collection Time: 10/18/20  6:22 AM   Specimen: BLOOD RIGHT FOREARM  Result Value Ref Range Status   Specimen Description BLOOD RIGHT FOREARM  Final   Special Requests   Final    BOTTLES DRAWN AEROBIC AND ANAEROBIC Blood Culture results may not be optimal due to an inadequate volume of blood received in culture bottles   Culture   Final    NO GROWTH 4 DAYS Performed at [facility], [place] [address], [place], [place] [address]    Report Status PENDING  Incomplete  Culture, blood (Routine X 2) w Reflex to ID Panel     Status: None (Preliminary result)   Collection Time: 10/18/20  1:52 PM   Specimen: BLOOD  Result Value Ref Range Status   Specimen Description BLOOD  SITE NOT SPECIFIED  Final   Special Requests   Final    BOTTLES DRAWN AEROBIC AND ANAEROBIC Blood Culture results may not be optimal due to an inadequate volume of blood received in culture bottles   Culture   Final    NO GROWTH 4 DAYS Performed at [facility], [place] [address], [place], [place] [address]    Report Status PENDING  Incomplete  Urine culture     Status: None   Collection Time: 10/18/20  5:04 PM   Specimen: Urine, Catheterized  Result Value Ref Range Status   Specimen Description URINE, CATHETERIZED  Final   Special Requests NONE  Final   Culture   Final    NO GROWTH Performed at [facility], [address], [place], [place] [address]    Report Status 10/20/2020 FINAL  Final         Radiology Studies: DG CHEST PORT 1 VIEW  Result Date: 10/22/2020 CLINICAL DATA:  Pneumonia. EXAM: PORTABLE CHEST 1 VIEW COMPARISON:  October 21, 2020. FINDINGS: Stable cardiomediastinal silhouette. Left-sided pacemaker is unchanged in position. Stable bilateral lung opacities are noted which may represent chronic lung disease, although acute superimposed inflammation or pneumonia cannot be excluded. No pneumothorax or pleural effusion is noted. Bony thorax is unremarkable. IMPRESSION: Stable bilateral lung opacities are noted which may represent chronic lung disease, although acute superimposed inflammation or pneumonia cannot be excluded. Electronically Signed   By: Marijo Conception M.D.   On: 10/22/2020 09:35   ECHOCARDIOGRAM COMPLETE  Result Date: 10/22/2020    ECHOCARDIOGRAM REPORT   Patient Name:   URIJAH BITTING Date of Exam: 10/22/2020 Medical Rec #:  [id number]       Height:       71.0 in Accession #:    [id number]      Weight:       160.3 lb Date of Birth:  [birth year]       BSA:          1.919 m Patient Age:    35 years  BP:           148/91 mmHg Patient Gender: M               HR:           60 bpm. Exam Location:  Inpatient Procedure: 2D Echo, Cardiac Doppler and Color  Doppler Indications:    I50.9* Heart failure (unspecified)  History:        Patient has prior history of Echocardiogram examinations, most                 recent 01/19/2014.  Sonographer:    Merrie Roof RDCS Referring Phys: [id number] [place]  1. Left ventricular ejection fraction, by estimation, is 40 to 45%. The left ventricle has mildly decreased function. The left ventricle demonstrates global hypokinesis. There is mild left ventricular hypertrophy. Left ventricular diastolic parameters are indeterminate.  2. Right ventricular systolic function is mildly reduced. The right ventricular size is mildly enlarged.  3. Left atrial size was severely dilated.  4. Right atrial size was severely dilated.  5. The mitral valve is abnormal. Mild mitral valve regurgitation.  6. Tricuspid valve regurgitation is moderate to severe.  7. The aortic valve is tricuspid. Aortic valve regurgitation is mild. Mild aortic valve sclerosis is present, with no evidence of aortic valve stenosis.  8. Aortic dilatation noted. There is borderline dilatation of the ascending aorta, measuring 38 mm. Comparison(s): Prior images unable to be directly viewed, comparison made by report only. Changes from prior study are noted. 01/19/2014: LVEF 50-55%. FINDINGS  Left Ventricle: Left ventricular ejection fraction, by estimation, is 40 to 45%. The left ventricle has mildly decreased function. The left ventricle demonstrates global hypokinesis. The left ventricular internal cavity size was normal in size. There is  mild left ventricular hypertrophy. Left ventricular diastolic parameters are indeterminate. Right Ventricle: The right ventricular size is mildly enlarged. No increase in right ventricular wall thickness. Right ventricular systolic function is mildly reduced. Left Atrium: Left atrial size was severely dilated. Right Atrium: Right atrial size was severely dilated. Pericardium: There is no evidence of pericardial effusion. Mitral  Valve: The mitral valve is abnormal. There is mild thickening of the mitral valve leaflet(s). Mild mitral valve regurgitation. Tricuspid Valve: The tricuspid valve is grossly normal. Tricuspid valve regurgitation is moderate to severe. Aortic Valve: The aortic valve is tricuspid. Aortic valve regurgitation is mild. Mild aortic valve sclerosis is present, with no evidence of aortic valve stenosis. Pulmonic Valve: The pulmonic valve was grossly normal. Pulmonic valve regurgitation is trivial. Aorta: Aortic dilatation noted. There is borderline dilatation of the ascending aorta, measuring 38 mm. Venous: The inferior vena cava was not well visualized. IAS/Shunts: No atrial level shunt detected by color flow Doppler. Additional Comments: A pacer wire is visualized.  LEFT VENTRICLE PLAX 2D LVIDd:         4.00 cm     Diastology LVIDs:         3.30 cm     LV e' lateral: 13.40 cm/s LV PW:         1.20 cm LV IVS:        1.20 cm LVOT diam:     2.00 cm LV SV:         39 LV SV Index:   20 LVOT Area:     3.14 cm  LV Volumes (MOD) LV vol d, MOD A4C: 48.1 ml LV vol s, MOD A4C: 27.1 ml LV SV MOD A4C:  48.1 ml RIGHT VENTRICLE RV Basal diam:  4.80 cm RV S prime:     7.83 cm/s TAPSE (M-mode): 1.6 cm LEFT ATRIUM              Index       RIGHT ATRIUM           Index LA diam:        3.60 cm  1.88 cm/m  RA Area:     35.90 cm LA Vol (A2C):   120.0 ml 62.53 ml/m RA Volume:   146.00 ml 76.07 ml/m LA Vol (A4C):   96.0 ml  50.02 ml/m LA Biplane Vol: 108.0 ml 56.27 ml/m  AORTIC VALVE LVOT Vmax:   77.00 cm/s LVOT Vmean:  42.700 cm/s LVOT VTI:    0.123 m  AORTA Ao Root diam: 3.70 cm Ao Asc diam:  3.80 cm TRICUSPID VALVE TR Peak grad:   53.6 mmHg TR Vmax:        366.00 cm/s  SHUNTS Systemic VTI:  0.12 m Systemic Diam: 2.00 cm Lyman Bishop MD Electronically signed by Lyman Bishop MD Signature Date/Time: 10/22/2020/4:47:20 PM    Final         Scheduled Meds: . amantadine  100 mg Oral BID  . apixaban  5 mg Oral BID  .  carbidopa-levodopa  1 tablet Oral QID  . famotidine  20 mg Oral Daily  . furosemide  40 mg Intravenous Once  . memantine  5 mg Oral BID  . potassium chloride  40 mEq Oral BID  . vitamin B-12  1,000 mcg Oral Daily   Continuous Infusions: . cefTRIAXone (ROCEPHIN)  IV 2 g (10/22/20 0931)  . doxycycline (VIBRAMYCIN) IV 100 mg (10/22/20 2234)     LOS: 5 days    Time spent: 30 minutes    Barb Merino, MD [facility] Pager [phone]

## 2020-10-23 NOTE — Progress Notes (Addendum)
Daily Progress Note   Patient Name: [name]       Date: 10/23/2020 DOB: [birth year]  Age: 87 y.o. MRN#: [id number] Attending Physician: Barb Merino, MD Primary Care Physician: [name], MD Admit Date: 10-20-20  Reason for Follow-up: continued GOC discussion  Subjective: 14:00--Patient has increased oxygen requirements to 15L. Patient appears uncomfortable with mildly labored breathing. He is lethargic, but will respond to some questions. Wife [name] is at bedside. I expressed concern that his oxygen requirements have increased and that he has not improved despite optimal medical treatment. Gently stated that patient is end of life and appeared to be suffering. Expressed concern that going home with hospice is not an option at this point due to higher oxygen requirements.   I reviewed the concept of a comfort path with [name] and encouraged her it would be appropriate to stop supportive medical interventions (labs, antibiotics, IV fluids, etc) and focus on his comfort and dignity rather than prolonging life. She seems to be having a difficult time processing this information. States her niece Butch Penny will be visiting this afternoon from [place]. I offered to come back later and discuss when Butch Penny is here.   16:30--Returned to bedside. Dr. Sloan Leiter is in the room and recommending transition to comfort care this evening. He is encouraging family to visit as soon as they can.   I provided education and couseling transitioning to comfort care while in the hospital, and what that would look like--keeping him clean and dry, no labs, no artificial hydration or feeding, no antibiotics, minimizing of medications, comfort feeds, medication for pain and dyspnea as needed. Recommended scheduled  morphine to help with his work of breathing. [name] agrees with this plan of care and asks "how long" -- Dr. Sloan Leiter and I both agreed prognosis is days at this point.  Emotional support was provided  18:15--Returned to room. Niece Butch Penny is at bedside and [name] has gone home to shower. I explained the concept of comfort care to Butch Penny - answered all questions. Provided a copy of "Gone from my sight".    Length of Stay: 5  Current Medications: Scheduled Meds:  . amantadine  100 mg Oral BID  . carbidopa-levodopa  1 tablet Oral QID  . famotidine  20 mg Oral Daily  . memantine  5 mg Oral BID  .  morphine CONCENTRATE  5 mg Sublingual Q4H     PRN Meds: acetaminophen **OR** acetaminophen, albuterol, antiseptic oral rinse, glycopyrrolate **OR** glycopyrrolate **OR** glycopyrrolate, LORazepam **OR** LORazepam **OR** LORazepam, ondansetron **OR** ondansetron (ZOFRAN) IV, polyvinyl alcohol, Resource ThickenUp Clear  Physical Exam Vitals reviewed.  Constitutional:      Appearance: He is ill-appearing.     Comments: Appears frail  Cardiovascular:     Comments: Paced rhythm Pulmonary:     Effort: Tachypnea and accessory muscle usage present.  Neurological:     Mental Status: He is lethargic.     Motor: Weakness present.             Vital Signs: BP 131/85 (BP Location: Right Arm)   Pulse 63   Temp 99.4 F (37.4 C) (Oral)   Resp 16   Ht 5\' 11"  (1.803 m)   Wt 72.7 kg   SpO2 90%   BMI 22.35 kg/m  SpO2: SpO2: 90 % O2 Device: O2 Device: High Flow Nasal Cannula O2 Flow Rate: O2 Flow Rate (L/min): 12 L/min  Intake/output summary:   Intake/Output Summary (Last 24 hours) at 10/23/2020 1830 Last data filed at 10/23/2020 0400 Gross per 24 hour  Intake 700 ml  Output 1450 ml  Net -750 ml   LBM: Last BM Date: 10/19/20 Baseline Weight: Weight: 70.1 kg Most recent weight: Weight: 72.7 kg       Palliative Assessment/Data: PPS 20%    Palliative Care Assessment & Plan    HPI/Patient  Profile: 87 y.o. male  with past medical history of atrial fibrillation on Eliquis, sick sinus syndrome status post pacemaker placement, and Parkinson's disease. He presented to the emergency department on 10/07/2020 with shortness of breath. Associated symptoms included subjective fever and new onset nonproductive cough.  In the ED, initial oxygen saturation noted in the mid 80's, but improved on 3L nasal cannula. Chest x-ray showed mixed patchy and coarse opacities in the bilateral lung suggestive of atypical pneumonia in the absence of edema, effusion, or pneumothorax. He was admitted to [facility] for management of acute hypoxic respiratory distress in the setting of suspected atypical community-acquired pneumonia.   Assessment: - community acquired pneumonia, suspect aspiration - acute respiratory failure - Parkinson's disease - acute metabolic encephalopathy - permanent A-fib - dysphagia - terminal care  Recommendations/Plan:  Full comfort measures initiated  DNR/DNI as previously documented  If stable overnight, transfer to hospice home [facility] when bed becomes available   Added orders for symptom management at EOL as well as discontinued orders that were not focused on comfort  Unrestricted visitation orders were placed per current [facility] EOL visitation policy   Provide frequent assessments and administer PRN medications as clinically necessary to ensure EOL comfort  PMT will continue to follow holistically  Symptom Management:   Scheduled morphine CONCENTRATE 10 mg/0.110ml oral solution 5 mg SL every 4 hours  Morphine 1 mg IV every 2 hours prn for breakthrough pain  Lorazepam (ATIVAN) prn for anxiety  Glycopyrrolate (ROBINUL) for excessive secretions  Ondansetron (ZOFRAN) prn for nausea  Polyvinyl alcohol (LIQUIFILM TEARS) prn for dry eyes  Antiseptic oral rinse (BIOTENE) prn for dry mouth  Place foley catheter  Goals of Care and Additional  Recommendations:  Limitations on Scope of Treatment: Full Comfort Care  Code Status: DNR/DNI  Prognosis:  days  Discharge Planning:  To Be Determined - hospital death versus [facility] was discussed with Dr. Sloan Leiter, primary RN, hospice liaison [facility] of  the [place])  Thank you for allowing the Palliative Medicine Team to assist in the care of this patient.   Total Time 68 minutes Prolonged Time Billed  yes       Greater than 50%  of this time was spent counseling and coordinating care related to the above assessment and plan.  Lavena Bullion, NP  Please contact Palliative Medicine Team phone at [phone] for questions and concerns.

## 2020-10-23 NOTE — Progress Notes (Signed)
Converted to comfort care and hospice.  In hospital death anticipated. If is stable by tomorrow, will pursue inpatient bed at [facility]

## 2020-10-24 DIAGNOSIS — Z66 Do not resuscitate: Secondary | ICD-10-CM

## 2020-10-24 DIAGNOSIS — Z515 Encounter for palliative care: Secondary | ICD-10-CM | POA: Diagnosis not present

## 2020-10-24 DIAGNOSIS — R0600 Dyspnea, unspecified: Secondary | ICD-10-CM

## 2020-10-24 DIAGNOSIS — G20 Parkinson's disease: Secondary | ICD-10-CM | POA: Diagnosis not present

## 2020-10-24 DIAGNOSIS — J189 Pneumonia, unspecified organism: Secondary | ICD-10-CM | POA: Diagnosis not present

## 2020-10-24 DIAGNOSIS — Z789 Other specified health status: Secondary | ICD-10-CM

## 2020-10-24 DIAGNOSIS — G9341 Metabolic encephalopathy: Secondary | ICD-10-CM | POA: Diagnosis not present

## 2020-10-24 DIAGNOSIS — R531 Weakness: Secondary | ICD-10-CM | POA: Diagnosis not present

## 2020-10-24 MED ORDER — MORPHINE SULFATE (PF) 4 MG/ML IV SOLN
4.0000 mg | Freq: Once | INTRAVENOUS | Status: AC
  Administered 2020-10-24: 4 mg via INTRAVENOUS
  Filled 2020-10-24: qty 1

## 2020-10-24 MED ORDER — MORPHINE 100MG IN NS 100ML (1MG/ML) PREMIX INFUSION
5.0000 mg/h | INTRAVENOUS | Status: DC
Start: 2020-10-24 — End: 2020-10-24
  Administered 2020-10-24: 5 mg/h via INTRAVENOUS
  Filled 2020-10-24: qty 100

## 2020-11-15 NOTE — Progress Notes (Signed)
Patient has labored breathing this morning. Reports shortness of breath at rest. Morphine given. Wife at bedside.

## 2020-11-15 NOTE — Progress Notes (Signed)
PROGRESS NOTE    [name]  EPP:[id number] DOB: 09/17/1931 DOA: 09/22/2020 PCP: [name], MD    Brief Narrative:  Patient is a 87 y.o.male who has history of persistent atrial fibrillation chronically anticoagulated on Eliquis and complicated by sick sinus syndrome status post pacemaker placement, Parkinson's disease.  Patient reported to the ED after 4-5 day period of shortness of breath with subjective report of fever, and new onset of non-productive cough. Per patient's wife, patient developed confusion relative to his usual baseline and also generalized weakness requiring more assistance than usual.  On admission, Tmax 98.5, heart rate 56-60; blood pressure121/43 - 124/78; respiratory rate 18-24; initial oxygen saturation noted to be in the mid 80s, which improved to 94 to 90% on 3 L nasal cannula. Chest x-ray showed mixed patchy and coarse opacities in the bilateral lung suggestive of atypical pneumonia without evidence of edema, effusion, or pneumothorax. EKG showed ventricular paced rhythm with ventricular rate 62, without evidence of acute ischemic changes. Patient was empirically treated for suspected atypical community-acquired pneumonia with broad spectrum antibiotics zithromycin 500 mg IV x1, Rocephin 2 g IV x1, and a 500 cc normal saline bolus.   Despite maximum medical therapy, he continues to worsen his respiratory status. Comfort care and hospice initiated on 2/6.  Assessment & Plan:   Principal Problem:   Community acquired pneumonia Active Problems:   GERD   Permanent atrial fibrillation   Shortness of breath   Parkinson's disease ([place])   Acute metabolic encephalopathy   Generalized weakness   Pneumonia  Community-acquired pneumonia with hypoxia: Also suspect aspiration pneumonia Patient probably has underlying fibrotic lung disease, undiagnosed.  Probable chronic recurrent aspiration Parkinson's disease Permanent A. fib and sick sinus syndrome status post  pacemaker GERD End-of-life care Advance physical deconditioning/severe frailty/failure to thrive.  Plan: Patient at end-of-life.  Failure to improve despite maximum medical therapy. Comfort care and hospice initiated. Patient on around-the-clock morphine and Ativan injection. Looks more uncomfortable today, with tachypnea and altered mentation. Will start patient on morphine infusion, frequent Ativan. Liberalize visitor policy. Hospice referral done to [facility] hospice for inpatient hospice, however he is not stable to transfer today. In-hospital date is anticipated. RN to pronounce death if happens. Goal is to keep patient comfortable for end-of-life.  DVT prophylaxis: Comfort care   Code Status: Comfort care Family Communication: Wife at the bedside.  Patient's neighbors at the bedside. Disposition Plan: Status is: Inpatient  Remains inpatient appropriate because:Inpatient level of care appropriate due to severity of illness   Dispo:  Patient From: Home  Planned Disposition: In hospital death anticipated.  Expected discharge date: Unknown.  Medically stable for discharge: No           Consultants:   Palliative team  Procedures:   None  Antimicrobials:  Antibiotics Given (last 72 hours)    Date/Time Action Medication Dose Rate   10/21/20 1419 New Bag/Given   doxycycline (VIBRAMYCIN) 100 mg in sodium chloride 0.9 % 250 mL IVPB 100 mg 125 mL/hr   10/21/20 2043 New Bag/Given   doxycycline (VIBRAMYCIN) 100 mg in sodium chloride 0.9 % 250 mL IVPB 100 mg 125 mL/hr   10/22/20 0931 New Bag/Given   cefTRIAXone (ROCEPHIN) 2 g in sodium chloride 0.9 % 100 mL IVPB 2 g 200 mL/hr   10/22/20 0939 New Bag/Given   doxycycline (VIBRAMYCIN) 100 mg in sodium chloride 0.9 % 250 mL IVPB 100 mg 125 mL/hr   10/22/20 2234 New Bag/Given   doxycycline (VIBRAMYCIN) 100  mg in sodium chloride 0.9 % 250 mL IVPB 100 mg 125 mL/hr   10/23/20 1143 New Bag/Given   doxycycline (VIBRAMYCIN)  100 mg in sodium chloride 0.9 % 250 mL IVPB 100 mg 125 mL/hr   10/23/20 1519 New Bag/Given   cefTRIAXone (ROCEPHIN) 2 g in sodium chloride 0.9 % 100 mL IVPB 2 g 200 mL/hr       Subjective: Patient seen and examined.  Overnight he is more confused.  He is looking very sick, tachypneic and uncomfortable.  He is unable to have any communication.  Did not respond today. Wife at the bedside.  2 of his neighbors came to visit him. Have met with patient's niece last evening. Patient looks very uncomfortable, will start patient on morphine infusion.  Objective: Vitals:   11/13/20 0201 11/13/2020 0452 November 13, 2020 0750 11/13/20 0800  BP:   (!) 122/99 128/82  Pulse:   92 74  Resp: (!) 26 (!) 30 (!) 31 (!) 28  Temp:   97.6 F (36.4 C) 98.8 F (37.1 C)  TempSrc:   Axillary Axillary  SpO2:   (!) 89% (!) 87%  Weight:      Height:       No intake or output data in the 24 hours ending 2020-11-13 1130 Filed Weights   10/19/20 1415 10/20/20 0500  Weight: 70.1 kg 72.7 kg    Examination:  Patient is sick looking, in moderate respiratory distress.  He is hard of hearing, however unable to respond today or follow commands. Poor bilateral air entry. On comfort oxygen.   Data Reviewed: I have personally reviewed following labs and imaging studies  CBC: Recent Labs  Lab 10/03/2020 2300 10/18/20 0809 10/19/20 0452 10/22/20 0141  WBC 9.6  --  11.6* 12.5*  NEUTROABS  --   --  9.2* 10.1*  HGB 13.8 14.3 13.0 12.7*  HCT 42.9 42.0 40.4 36.7*  MCV 92.7  --  96.0 90.8  PLT 238  --  197 856   Basic Metabolic Panel: Recent Labs  Lab 09/29/2020 2300 10/18/20 0656 10/18/20 0809 10/19/20 0452 10/22/20 0141  NA 140  --  142 139 141  K 3.6  --  4.3 4.0 3.2*  CL 105  --   --  109 107  CO2 22  --   --  19* 21*  GLUCOSE 106*  --   --  106* 126*  BUN 16  --   --  14 12  CREATININE 0.87  --   --  0.74 0.80  CALCIUM 8.4*  --   --  8.0* 8.2*  MG  --  2.2  --  1.9 1.7  PHOS  --  4.1  --  3.6 3.8    GFR: Estimated Creatinine Clearance: 66.9 mL/min (by C-G formula based on SCr of 0.8 mg/dL). Liver Function Tests: No results for input(s): AST, ALT, ALKPHOS, BILITOT, PROT, ALBUMIN in the last 168 hours. No results for input(s): LIPASE, AMYLASE in the last 168 hours. No results for input(s): AMMONIA in the last 168 hours. Coagulation Profile: No results for input(s): INR, PROTIME in the last 168 hours. Cardiac Enzymes: No results for input(s): CKTOTAL, CKMB, CKMBINDEX, TROPONINI in the last 168 hours. BNP (last 3 results) No results for input(s): PROBNP in the last 8760 hours. HbA1C: No results for input(s): HGBA1C in the last 72 hours. CBG: No results for input(s): GLUCAP in the last 168 hours. Lipid Profile: No results for input(s): CHOL, HDL, LDLCALC, TRIG, CHOLHDL, LDLDIRECT in the  last 72 hours. Thyroid Function Tests: No results for input(s): TSH, T4TOTAL, FREET4, T3FREE, THYROIDAB in the last 72 hours. Anemia Panel: No results for input(s): VITAMINB12, FOLATE, FERRITIN, TIBC, IRON, RETICCTPCT in the last 72 hours. Sepsis Labs: Recent Labs  Lab 10/18/20 0656  PROCALCITON <0.10    Recent Results (from the past 240 hour(s))  SARS Coronavirus 2 by RT PCR (hospital order, performed in [facility] hospital lab) Nasopharyngeal Nasopharyngeal Swab     Status: None   Collection Time: 09/19/2020 11:03 PM   Specimen: Nasopharyngeal Swab  Result Value Ref Range Status   SARS Coronavirus 2 NEGATIVE NEGATIVE Final    Comment: (NOTE) SARS-CoV-2 target nucleic acids are NOT DETECTED.  The SARS-CoV-2 RNA is generally detectable in upper and lower respiratory specimens during the acute phase of infection. The lowest concentration of SARS-CoV-2 viral copies this assay can detect is 250 copies / mL. A negative result does not preclude SARS-CoV-2 infection and should not be used as the sole basis for treatment or other patient management decisions.  A negative result may occur  with improper specimen collection / handling, submission of specimen other than nasopharyngeal swab, presence of viral mutation(s) within the areas targeted by this assay, and inadequate number of viral copies (<250 copies / mL). A negative result must be combined with clinical observations, patient history, and epidemiological information.  Fact Sheet for Patients:   StrictlyIdeas.no  Fact Sheet for Healthcare Providers: BankingDealers.co.za  This test is not yet approved or  cleared by the [place] FDA and has been authorized for detection and/or diagnosis of SARS-CoV-2 by FDA under an Emergency Use Authorization (EUA).  This EUA will remain in effect (meaning this test can be used) for the duration of the COVID-19 declaration under Section 564(b)(1) of the Act, 21 U.S.C. section 360bbb-3(b)(1), unless the authorization is terminated or revoked sooner.  Performed at [facility], [place] [address], [place], [place] [address]   Culture, blood (Routine X 2) w Reflex to ID Panel     Status: None   Collection Time: 10/18/20  6:22 AM   Specimen: BLOOD RIGHT FOREARM  Result Value Ref Range Status   Specimen Description BLOOD RIGHT FOREARM  Final   Special Requests   Final    BOTTLES DRAWN AEROBIC AND ANAEROBIC Blood Culture results may not be optimal due to an inadequate volume of blood received in culture bottles   Culture   Final    NO GROWTH 5 DAYS Performed at [facility], [place] [address] [place], [place] [address]    Report Status 10/23/2020 FINAL  Final  Culture, blood (Routine X 2) w Reflex to ID Panel     Status: None   Collection Time: 10/18/20  1:52 PM   Specimen: BLOOD  Result Value Ref Range Status   Specimen Description BLOOD SITE NOT SPECIFIED  Final   Special Requests   Final    BOTTLES DRAWN AEROBIC AND ANAEROBIC Blood Culture results may not be optimal due to an inadequate volume of blood received in  culture bottles   Culture   Final    NO GROWTH 5 DAYS Performed at [facility], [place] [address], [place], [place] [address]    Report Status 10/23/2020 FINAL  Final  Urine culture     Status: None   Collection Time: 10/18/20  5:04 PM   Specimen: Urine, Catheterized  Result Value Ref Range Status   Specimen Description URINE, CATHETERIZED  Final  Special Requests NONE  Final   Culture   Final    NO GROWTH Performed at [facility], [place] [address] [place], [place] [address]    Report Status 10/20/2020 FINAL  Final         Radiology Studies: ECHOCARDIOGRAM COMPLETE  Result Date: 10/22/2020    ECHOCARDIOGRAM REPORT   Patient Name:   [name] Date of Exam: 10/22/2020 Medical Rec #:  [id number]       Height:       71.0 in Accession #:    [id number]      Weight:       160.3 lb Date of Birth:  31-Mar-1931       BSA:          1.919 m Patient Age:    7 years        BP:           148/91 mmHg Patient Gender: M               HR:           60 bpm. Exam Location:  Inpatient Procedure: 2D Echo, Cardiac Doppler and Color Doppler Indications:    I50.9* Heart failure (unspecified)  History:        Patient has prior history of Echocardiogram examinations, most                 recent 01/19/2014.  Sonographer:    Merrie Roof RDCS Referring Phys: [id number]  1. Left ventricular ejection fraction, by estimation, is 40 to 45%. The left ventricle has mildly decreased function. The left ventricle demonstrates global hypokinesis. There is mild left ventricular hypertrophy. Left ventricular diastolic parameters are indeterminate.  2. Right ventricular systolic function is mildly reduced. The right ventricular size is mildly enlarged.  3. Left atrial size was severely dilated.  4. Right atrial size was severely dilated.  5. The mitral valve is abnormal. Mild mitral valve regurgitation.  6. Tricuspid valve regurgitation is moderate to severe.  7. The aortic valve is tricuspid. Aortic  valve regurgitation is mild. Mild aortic valve sclerosis is present, with no evidence of aortic valve stenosis.  8. Aortic dilatation noted. There is borderline dilatation of the ascending aorta, measuring 38 mm. Comparison(s): Prior images unable to be directly viewed, comparison made by report only. Changes from prior study are noted. 01/19/2014: LVEF 50-55%. FINDINGS  Left Ventricle: Left ventricular ejection fraction, by estimation, is 40 to 45%. The left ventricle has mildly decreased function. The left ventricle demonstrates global hypokinesis. The left ventricular internal cavity size was normal in size. There is  mild left ventricular hypertrophy. Left ventricular diastolic parameters are indeterminate. Right Ventricle: The right ventricular size is mildly enlarged. No increase in right ventricular wall thickness. Right ventricular systolic function is mildly reduced. Left Atrium: Left atrial size was severely dilated. Right Atrium: Right atrial size was severely dilated. Pericardium: There is no evidence of pericardial effusion. Mitral Valve: The mitral valve is abnormal. There is mild thickening of the mitral valve leaflet(s). Mild mitral valve regurgitation. Tricuspid Valve: The tricuspid valve is grossly normal. Tricuspid valve regurgitation is moderate to severe. Aortic Valve: The aortic valve is tricuspid. Aortic valve regurgitation is mild. Mild aortic valve sclerosis is present, with no evidence of aortic valve stenosis. Pulmonic Valve: The pulmonic valve was grossly normal. Pulmonic valve regurgitation is trivial. Aorta: Aortic dilatation noted. There is borderline dilatation of the ascending aorta, measuring 38 mm. Venous:  The inferior vena cava was not well visualized. IAS/Shunts: No atrial level shunt detected by color flow Doppler. Additional Comments: A pacer wire is visualized.  LEFT VENTRICLE PLAX 2D LVIDd:         4.00 cm     Diastology LVIDs:         3.30 cm     LV e' lateral: 13.40 cm/s LV  PW:         1.20 cm LV IVS:        1.20 cm LVOT diam:     2.00 cm LV SV:         39 LV SV Index:   20 LVOT Area:     3.14 cm  LV Volumes (MOD) LV vol d, MOD A4C: 48.1 ml LV vol s, MOD A4C: 27.1 ml LV SV MOD A4C:     48.1 ml RIGHT VENTRICLE RV Basal diam:  4.80 cm RV S prime:     7.83 cm/s TAPSE (M-mode): 1.6 cm LEFT ATRIUM              Index       RIGHT ATRIUM           Index LA diam:        3.60 cm  1.88 cm/m  RA Area:     35.90 cm LA Vol (A2C):   120.0 ml 62.53 ml/m RA Volume:   146.00 ml 76.07 ml/m LA Vol (A4C):   96.0 ml  50.02 ml/m LA Biplane Vol: 108.0 ml 56.27 ml/m  AORTIC VALVE LVOT Vmax:   77.00 cm/s LVOT Vmean:  42.700 cm/s LVOT VTI:    0.123 m  AORTA Ao Root diam: 3.70 cm Ao Asc diam:  3.80 cm TRICUSPID VALVE TR Peak grad:   53.6 mmHg TR Vmax:        366.00 cm/s  SHUNTS Systemic VTI:  0.12 m Systemic Diam: 2.00 cm Lyman Bishop MD Electronically signed by Lyman Bishop MD Signature Date/Time: 10/22/2020/4:47:20 PM    Final         Scheduled Meds: . memantine  5 mg Oral BID   Continuous Infusions: . morphine 5 mg/hr (Oct 25, 2020 1008)     LOS: 6 days    Time spent: 35 minutes    Barb Merino, MD [facility] Pager [phone]

## 2020-11-15 NOTE — Plan of Care (Signed)
  Problem: Activity: Goal: Ability to tolerate increased activity will improve Outcome: Not Progressing  Patient comfort care

## 2020-11-15 NOTE — Progress Notes (Addendum)
Daily Progress Note   Patient Name: [name]       Date: 10/23/2020 DOB: September 14, 1931  Age: 87 y.o. MRN#: [id number] Attending Physician: Barb Merino, MD Primary Care Physician: [name], MD Admit Date: 2020/10/23  Reason for Consultation/Follow-up: Disposition, Non pain symptom management, Pain control, Psychosocial/spiritual support and Terminal Care  Subjective: Chart review performed. Received report from primary RN - no acute concerns. RN states morphine drip was initiated for patient this morning due to restlessness, discomfort, and tachypnea. RN reports patient's symptoms have improved since initiating drip.   Went to visit patient at bedside - wife/[name] was present and asleep in recliner. Patient was lying in bed - he does not wake to voice or gentle touch. No signs or non-verbal gestures of pain or discomfort noted. No respiratory distress or secretions noted;  increased work of breathing noted. I provided 1mg  morphine bolus with symptom improvement.  I spoke with wife at bedside. Therapeutic listening was provided as she reflected over [name]' progression of Parkinson's disease and hospitalization. Natural disease trajectory and expectations at EOL were reviewed. Emotional support was provided. I asked [name] if she had a good support system at this time. As [name] was discussing her support system, I noted patient's breathing pattern quickly changed. I informed [name] of my observation and told her that I felt [name] was likely approaching his last breaths. I lowered bedrail and offered to have her sit on the bed and hold [name]' hand if she wanted to - she did. I asked if there was anyone she would like me to call for her. [name] called her pastor and placed him on  speaker phone - I updated him on current situation - he gave a beautiful prayer for [name] and the patient. After phone call with pastor, patient took his last breath. Emotional support was provided. [name] called other people she found comfort in speaking with. [name] was tearful but expressed that she felt it was for the best he passed away in house because she did not want him moved in his current state. Offered chaplain services to [name] until her family/friends could come be by her side - she was accepting. I notified primary RN that patient had passed. Primary RN and myself verified no heart beat. RN will page chaplain.  The patient passed comfortably with his wife at his side holding his hand.    Length of Stay: 6  Current Medications: Scheduled Meds:  . memantine  5 mg Oral BID    Continuous Infusions: . morphine 5 mg/hr (11/12/2020 1008)    PRN Meds: acetaminophen **OR** acetaminophen, albuterol, antiseptic oral rinse, glycopyrrolate **OR** glycopyrrolate **OR** glycopyrrolate, LORazepam **OR** LORazepam **OR** LORazepam, ondansetron **OR** ondansetron (ZOFRAN) IV, polyvinyl alcohol, Resource ThickenUp Clear  Physical Exam Vitals and nursing note reviewed.  Constitutional:      General: He is not in acute distress.    Appearance: He is ill-appearing.  Pulmonary:     Effort: No respiratory distress.     Comments: Increased work of breathing Skin:    General: Skin is cool and dry.  Neurological:     Mental Status: He is unresponsive.     Motor: Weakness present.  Psychiatric:        Speech: He is noncommunicative.             Vital Signs: BP 128/82 (BP Location: Right Arm)   Pulse 74   Temp 98.8 F (37.1 C) (Axillary)   Resp 18   Ht 5\' 11"  (1.803 m)   Wt 72.7 kg   SpO2 (!) 87%   BMI 22.35 kg/m  SpO2: SpO2: (!) 87 % O2 Device: O2 Device: Nasal Cannula O2 Flow Rate: O2 Flow Rate (L/min): 12 L/min  Intake/output summary: No intake or output data in the 24  hours ending 10/30/2020 1323 LBM: Last BM Date:  (prior to admission) Baseline Weight: Weight: 70.1 kg Most recent weight: Weight: 72.7 kg       Palliative Assessment/Data: PPS 10%      Patient Active Problem List   Diagnosis Date Noted  . End of life care 11/05/2020  . Pneumonia 10/19/2020  . Community acquired pneumonia 10/18/2020  . Acute metabolic encephalopathy 41/93/7902  . Generalized weakness 10/18/2020  . Cognitive dysfunction 07/14/2020  . Lower extremity numbness 06/12/2019  . Weakness of both lower extremities 06/11/2019  . Elevated brain natriuretic peptide (BNP) level 06/11/2019  . Hyperglycemia 06/11/2019  . Leucocytosis 06/11/2019  . Parkinson's disease ([place]) 11/30/2018  . Pelvic pain 11/30/2018  . Intracranial aneurysm 11/28/2016  . Gait disturbance 11/06/2016  . Muscle fatigue 11/06/2016  . Post herpetic neuralgia 11/06/2016  . Left inguinal hernia 10/06/2014  . Shortness of breath 01/03/2014  . Pacemaker-St.Jude 07/09/2012  . Permanent atrial fibrillation 10/20/2010  . HYPERLIPIDEMIA-MIXED 10/26/2009  . Macular degeneration 12/28/2008  . CAD, NATIVE VESSEL 12/28/2008  . GERD 12/28/2008  . SYNCOPE 12/28/2008  . Hypertension 12/28/2008  . Sick sinus syndrome ([place]) 12/28/2008    Palliative Care Assessment & Plan   Patient Profile: 87 y.o.malewith past medical history of atrial fibrillation on Eliquis, sick sinus syndrome status post pacemaker placement, and Parkinson's disease. He presented to the emergency departmenton Feb 27, 2022with shortness of breath.Associated symptoms included subjective fever and new onset nonproductive cough. In the ED, initial oxygen saturation noted in the mid 80's, but improved on 3L nasal cannula. Chest x-ray showed mixed patchy and coarse opacities in the bilateral lung suggestive of atypical pneumonia in the absence of edema, effusion, or pneumothorax. He was admitted to [facility] for management of acute hypoxic respiratory  distress in the setting of suspected atypical community-acquired pneumonia.  Assessment: Community acquired pneumonia, suspect aspiration Acute respiratory failure Parkinson's disease Acute metabolic encephalopathy Permanent A-fib Dysphagia Generalized weakness Terminal care  Recommendations/Plan:  During my visit, the patient passed peacefully  with wife at his side holding his hand.   RN will page chaplain to visit with wife until family/friends can be present with her  Goals of Care and Additional Recommendations:  Limitations on Scope of Treatment: Full Comfort Care  Code Status:    Code Status Orders  (From admission, onward)         Start     Ordered   10/23/20 1639  Do not attempt resuscitation (DNR)  Continuous       Question Answer Comment  In the event of cardiac or respiratory ARREST Do not call a "code blue"   In the event of cardiac or respiratory ARREST Do not perform Intubation, CPR, defibrillation or ACLS   In the event of cardiac or respiratory ARREST Use medication by any route, position, wound care, and other measures to relive pain and suffering. May use oxygen, suction and manual treatment of airway obstruction as needed for comfort.      10/23/20 1641        Code Status History    Date Active Date Inactive Code Status Order ID Comments User Context   10/18/2020 0617 10/23/2020 1641 DNR [id number]  Rhetta Mura, DO ED   06/11/2019 0854 06/13/2019 1529 DNR [id number]  Lavina Hamman, MD ED   11/30/2018 0110 12/01/2018 1448 Full Code [id number]  Elwyn Reach, MD Inpatient   10/21/2018 1618 10/21/2018 2004 Full Code [id number]  Thompson Grayer, MD Inpatient   10/06/2014 1436 10/07/2014 1311 Full Code [id number]  Fanny Skates, MD Inpatient   Advance Care Planning Activity       Prognosis:   Patient has passed  Discharge Planning:  Was hospital death  Care plan was discussed with primary RN, Dr. Sloan Leiter, [facility]  Thank you for allowing the  Palliative Medicine Team to assist in the care of this patient.   Total Time 25 minutes Prolonged Time Billed  no       Greater than 50%  of this time was spent counseling and coordinating care related to the above assessment and plan.  Lin Landsman, NP  Please contact Palliative Medicine Team phone at [phone] for questions and concerns.

## 2020-11-15 NOTE — TOC Progression Note (Signed)
Transition of Care [facility]) - Progression Note    Patient Details  Name: [name] MRN: [id number] Date of Birth: 08-23-1931  Transition of Care [facility]) CM/SW Contact  Joanne Chars, LCSW Phone Number: 11-07-2020, 9:48 AM  Clinical Narrative:  CSW spoke with pt wife [name] and confirmed that she is interested in the residential hospice in [place].  She is not sure if they have been contacted or not but agreed that CSW would confirm that they have the referral.  CSW LM for [facility] from [facility].     Expected Discharge Plan: [place] Barriers to Discharge: Continued Medical Work up,Other (comment) ([employer] medicare needing [place])  Expected Discharge Plan and Services Expected Discharge Plan: [place] Choice: [place] arrangements for the past 2 months: Single Family Home                                       Social Determinants of Health (SDOH) Interventions    Readmission Risk Interventions No flowsheet data found.

## 2020-11-15 NOTE — Progress Notes (Signed)
62ml of morphine sulfate wasted with Henry Russel , RN

## 2020-11-15 NOTE — Progress Notes (Signed)
   10/31/2020 1411  Clinical Encounter Type  Visited With Patient and family together  Visit Type Death  Referral From Nurse  Consult/Referral To Chaplain   Chaplain responded to page. Pt's wife, [name], was at bedside. [name]'s nephew and other family is on their way to the hospital. Chaplain presence not currently needed. Chaplain remains available.  This note was prepared by Chaplain Resident, Dante Gang, MDiv. Chaplain remains available as needed through the on-call pager: [phone].

## 2020-11-15 NOTE — Death Summary Note (Signed)
DEATH SUMMARY   Patient Details  Name: [name] MRN: [id number] DOB: 1930-12-30  Admission/Discharge Information   Admit Date:  2020/11/16  Date of Death: Date of Death: 11/23/2020  Time of Death: Time of Death: 40  Length of Stay: Jan 21, 2023  Referring Physician: [name], MD   Reason(s) for Hospitalization  Pneumonia   Diagnoses  Preliminary cause of death:  Secondary Diagnoses (including complications and co-morbidities):  Principal Problem:   Community acquired pneumonia Active Problems:   GERD   Permanent atrial fibrillation   Shortness of breath   Parkinson's disease ([place])   Acute metabolic encephalopathy   Generalized weakness   Pneumonia   End of life care   Brief Hospital Course (including significant findings, care, treatment, and services provided and events leading to death)  [name] is a 87 y.o. year old male who has history of Parkinson's disease, advanced debility, chronic aspiration, permanent A. fib on Eliquis, sick sinus syndrome status post pacemaker who presented to the emergency room with about 4 to 5 days of shortness of breath and cough.  Patient was found to have bilateral pneumonia with hypoxia.  He was admitted to the hospital and treated with broad-spectrum antibiotics and oxygen as well as chest physiotherapy.  All cultures were negative.  COVID-19 was negative, repeated x2.  Patient was probably aspirating.  Despite maximum medical therapy, patient continued to aspirate and his pneumonia did not heal.  Patient was with increasing oxygen demand and confusion.  A comfort care and hospice approach was started on 2/6 after consultation with patient's wife.  Patient ultimately died in the hospital under comfort care measures with family at the bedside.  Causes of death. Aspiration pneumonia Secondary to Parkinson's disease Other causes  Permanent Afib Metabolic encephalopathy Frailty and debility       Pertinent Labs and Studies   Significant Diagnostic Studies CT HEAD WO CONTRAST  Result Date: 10/21/2020 CLINICAL DATA:  Altered mental status, confusion EXAM: CT HEAD WITHOUT CONTRAST TECHNIQUE: Contiguous axial images were obtained from the base of the skull through the vertex without intravenous contrast. COMPARISON:  09/16/2020 FINDINGS: Brain: Normal anatomic configuration. Parenchymal volume loss is commensurate with the patient's age. Extensive subcortical and periventricular white matter changes are present likely reflecting the sequela of small vessel ischemia. Remote lacunar infarcts are noted within the insular cortices bilaterally as well as the anterior limb of the right internal capsule no abnormal intra or extra-axial mass lesion or fluid collection. No abnormal mass effect or midline shift. No evidence of acute intracranial hemorrhage or infarct. Ventricular size is normal. Cerebellum unremarkable. Vascular: No asymmetric hyperdense vasculature at the skull base. Skull: Intact Sinuses/Orbits: Small air-fluid level noted within the right maxillary sinus. Debris noted within the left maxillary sinus and sphenoid sinuses. Remaining paranasal sinuses are clear. Orbits are unremarkable. Other: Mastoid air cells and middle ear cavities are clear. IMPRESSION: Stable extensive senescent change.  Stable remote infarcts. No evidence of acute intracranial hemorrhage or infarct. Mild bilateral paranasal sinus disease, mildly progressive since prior examination. Electronically Signed   By: Fidela Salisbury MD   On: 10/21/2020 10:16   DG CHEST PORT 1 VIEW  Result Date: 10/22/2020 CLINICAL DATA:  Pneumonia. EXAM: PORTABLE CHEST 1 VIEW COMPARISON:  October 21, 2020. FINDINGS: Stable cardiomediastinal silhouette. Left-sided pacemaker is unchanged in position. Stable bilateral lung opacities are noted which may represent chronic lung disease, although acute superimposed inflammation or pneumonia cannot be excluded. No pneumothorax or pleural  effusion is  noted. Bony thorax is unremarkable. IMPRESSION: Stable bilateral lung opacities are noted which may represent chronic lung disease, although acute superimposed inflammation or pneumonia cannot be excluded. Electronically Signed   By: Marijo Conception M.D.   On: 10/22/2020 09:35   DG CHEST PORT 1 VIEW  Result Date: 10/21/2020 CLINICAL DATA:  Airway aspiration EXAM: PORTABLE CHEST 1 VIEW COMPARISON:  Yesterday FINDINGS: Chronic lung disease based on remote imaging. There is also acute appearing superimposed reticular opacity which is bilateral (comparing 2020 with admission radiographs). No visible effusion or pneumothorax. Cardiomegaly. Stable positioning of dual-chamber pacer leads from the left. No visible effusion or pneumothorax. IMPRESSION: Unchanged acute on chronic pulmonary opacification. Interval aspiration could easily be obscured by the extent of pre-existing disease. Electronically Signed   By: Monte Fantasia M.D.   On: 10/21/2020 05:01   DG CHEST PORT 1 VIEW  Result Date: 10/20/2020 CLINICAL DATA:  Follow-up pneumonia EXAM: PORTABLE CHEST 1 VIEW COMPARISON:  10/08/2020 FINDINGS: Cardiac shadow is enlarged in size. Pacing device is again seen. Diffuse airspace opacities are noted which have increased slightly in the interval from the prior exam. No bony abnormality is seen. Pacing device is again noted. IMPRESSION: Slight increase in airspace opacity bilaterally when compared with the prior exam. Electronically Signed   By: Inez Catalina M.D.   On: 10/20/2020 03:40   DG Chest Portable 1 View  Result Date: 09/26/2020 CLINICAL DATA:  Shortness of breath, hypoxic EXAM: PORTABLE CHEST 1 VIEW COMPARISON:  Radiograph 06/11/2019 FINDINGS: Mixed areas of coarse heterogeneous and patchy opacity present in the lungs in a mid to lower lung and peripheral predominance. Additional coalescent opacity noted along right heart border. No pneumothorax. Trace left effusion may be present. No right  effusion is seen. Enlarged cardiac silhouette is similar to priors. Pacer pack overlies the left chest wall with leads at the right atrium and cardiac apex. Surgical clips noted at the diaphragmatic hiatus in the right upper quadrant. IMPRESSION: 1. Mixed patchy and coarse opacities in the lungs with a mid to lower lung and peripheral predominance, with more coalescent opacity along the right heart border. Could reflect an acute pneumonia including atypical viral etiologies such as COVID 19. Given the somewhat masslike appearance of the right hilar opacity, recommend follow-up imaging to resolution. Electronically Signed   By: Lovena Le M.D.   On: 09/26/2020 23:15   DG Swallowing Func-Speech Pathology  Result Date: 10/21/2020 Objective Swallowing Evaluation: Type of Study: MBS-Modified Barium Swallow Study  Patient Details Name: [name] MRN: [id number] Date of Birth: 05/20/1931 Today's Date: 10/21/2020 Time: SLP Start Time (ACUTE ONLY): 1010 -SLP Stop Time (ACUTE ONLY): 1030 SLP Time Calculation (min) (ACUTE ONLY): 20 min Past Medical History: Past Medical History: Diagnosis Date . CAD (coronary artery disease) 09/2008 . Cancer ([place])   basal cell carcinoma . CHF (congestive heart failure) ([place])  . Complication of anesthesia   06/12/11 anesthesia made legs asleep also and went into chf and ended up in ICU after gallbladder surgery . Diastolic dysfunction   hx of chf due to fluid overload 9/12  . Dysrhythmia   atrial fibrillation . GERD (gastroesophageal reflux disease)  . History of blood product transfusion   d/t bleeding ulcer . Hypertension  . Macular degeneration (senile) of retina  . Persistent atrial fibrillation ([place])  . PONV (postoperative nausea and vomiting)  . Presence of permanent cardiac pacemaker 2010 . Shortness of breath dyspnea   exertion . Sick sinus syndrome ([place])  St. Jude . Syncope   resolved s/p PPM Past Surgical History: Past Surgical History: Procedure Laterality Date .  CHOLECYSTECTOMY  06/12/11 . INGUINAL HERNIA REPAIR Left 10/06/2014  Procedure: OPEN REPAIR LEFT INGUINAL HERNIA ;  Surgeon: Fanny Skates, MD;  Location: [facility];  Service: General;  Laterality: Left; . INSERTION OF MESH Left 10/06/2014  Procedure: INSERTION OF MESH;  Surgeon: Fanny Skates, MD;  Location: [facility];  Service: General;  Laterality: Left; . IR KYPHO LUMBAR INC FX REDUCE BONE BX UNI/BIL CANNULATION INC/IMAGING  12/26/2017 . IR RADIOLOGIST EVAL & MGMT  12/12/2017 . LAPAROSCOPIC CHOLECYSTECTOMY W/ CHOLANGIOGRAPHY  06/12/2011 . PACEMAKER INSERTION  09/28/08  SJM [place] XL DR . perforated ulcer   . PPM GENERATOR CHANGEOUT N/A 10/21/2018  Procedure: PPM GENERATOR CHANGEOUT;  Surgeon: Thompson Grayer, MD;  Location: [facility];  Service: Cardiovascular;  Laterality: N/A; . right inguinal hernia repair  11/19/2002  with mesh . TRANSURETHRAL RESECTION OF BLADDER TUMOR  09/06/2011  Procedure: TRANSURETHRAL RESECTION OF BLADDER TUMOR (TURBT);  Surgeon: Ailene Rud, MD;  Location: [facility];  Service: Urology;  Laterality: N/A; . TRANSURETHRAL RESECTION OF PROSTATE  [id number] . umblical hernia  [id number] HPI: Pt is an 87 y.o. male with medical history significant for persistent atrial fibrillation chronically anticoagulated on Eliquis and complicated by sick sinus syndrome status post pacemaker placement, Parkinson's disease, who presented to the ED with c/o shortness of breath and was admitted on 09/25/2020 with suspected community-acquired pneumonia. Pt demonstrated a coughing episode with water on 2/4 with subsequent desatting to 81% and recovery to 91-93% after increased HOB and use of 8L HFNC. CXR: acute on chronic pulmonary opacification. Interval aspiration could easily be obscured by the extent of pre-existing disease. SLP was consulted secondary to event on 2/4.  No data recorded Assessment / Plan / Recommendation CHL IP CLINICAL IMPRESSIONS 10/21/2020 Clinical Impression Pt presents with oropharyngeal  dysphagia characterized by impaired mastication, reduced bolus cohesion, impaired bolus propulsion, reduced lingual retraction, and a pharyngeal delay. Pt demonstrated prolonged and vigorous mastication of solids which persisted beyond the point of necessity; pt intermittently required cueing to swallow dysphagia 3 solids. He exhibited lingual rocking, and premature spillage with liquids. Mild to moderate vallecular residue was noted depending on bolus size. Residue was reduced, but not eliminted, with a liquid wash. When residue was moderate, nectar thick liquids were observed to prematurely spill over the residue and result in penetration (PAS 3) with subsequent aspiration (PAS 7,8). Penetration (PAS 3) and aspiration (PAS 7,8) were observed with thin liquids. This was not eliminated with reduced bolus sizes and he was unable to demonstrate other compensatory strategies, despite cueing. Coughing was inconsistent following aspiration events and ineffective. A dyspahgia 2 diet with nectar thick liquids is recommended at this time. SLP will follow for dysphagia treatment. SLP Visit Diagnosis Dysphagia, oropharyngeal phase (R13.12) Attention and concentration deficit following -- Frontal lobe and executive function deficit following -- Impact on safety and function Mild aspiration risk   CHL IP TREATMENT RECOMMENDATION 10/21/2020 Treatment Recommendations Therapy as outlined in treatment plan below   Prognosis 10/21/2020 Prognosis for Safe Diet Advancement Good Barriers to Reach Goals Cognitive deficits Barriers/Prognosis Comment -- CHL IP DIET RECOMMENDATION 10/21/2020 SLP Diet Recommendations Dysphagia 2 (Fine chop) solids;Nectar thick liquid Liquid Administration via Cup;No straw Medication Administration Crushed with puree Compensations Slow rate;Small sips/bites;Follow solids with liquid Postural Changes Seated upright at 90 degrees   CHL IP OTHER RECOMMENDATIONS 10/21/2020 Recommended Consults -- Oral Care  Recommendations  Oral care BID Other Recommendations Order thickener from pharmacy   CHL IP FOLLOW UP RECOMMENDATIONS 10/21/2020 Follow up Recommendations Skilled Nursing facility   [facility] IP FREQUENCY AND DURATION 10/21/2020 Speech Therapy Frequency (ACUTE ONLY) min 2x/week Treatment Duration 2 weeks      CHL IP ORAL PHASE 10/21/2020 Oral Phase Impaired Oral - Pudding Teaspoon -- Oral - Pudding Cup -- Oral - Honey Teaspoon -- Oral - Honey Cup Premature spillage;Decreased bolus cohesion Oral - Nectar Teaspoon -- Oral - Nectar Cup Premature spillage;Decreased bolus cohesion;Reduced posterior propulsion;Lingual pumping Oral - Nectar Straw Premature spillage;Decreased bolus cohesion;Lingual pumping;Reduced posterior propulsion Oral - Thin Teaspoon Premature spillage;Decreased bolus cohesion Oral - Thin Cup Premature spillage;Decreased bolus cohesion;Reduced posterior propulsion;Lingual pumping Oral - Thin Straw Premature spillage;Decreased bolus cohesion;Lingual pumping;Reduced posterior propulsion Oral - Puree Premature spillage;Decreased bolus cohesion Oral - Mech Soft WFL Oral - Regular -- Oral - Multi-Consistency -- Oral - Pill -- Oral Phase - Comment --  CHL IP PHARYNGEAL PHASE 10/21/2020 Pharyngeal Phase Impaired Pharyngeal- Pudding Teaspoon -- Pharyngeal -- Pharyngeal- Pudding Cup -- Pharyngeal -- Pharyngeal- Honey Teaspoon -- Pharyngeal -- Pharyngeal- Honey Cup Reduced tongue base retraction;Pharyngeal residue - valleculae;Pharyngeal residue - pyriform;Reduced anterior laryngeal mobility Pharyngeal Material does not enter airway Pharyngeal- Nectar Teaspoon -- Pharyngeal -- Pharyngeal- Nectar Cup Reduced tongue base retraction;Pharyngeal residue - valleculae;Pharyngeal residue - pyriform;Reduced anterior laryngeal mobility;Penetration/Aspiration during swallow;Penetration/Apiration after swallow Pharyngeal -- Pharyngeal- Nectar Straw Reduced tongue base retraction;Pharyngeal residue - valleculae;Pharyngeal residue -  pyriform;Reduced anterior laryngeal mobility;Penetration/Aspiration during swallow;Penetration/Apiration after swallow Pharyngeal Material enters airway, CONTACTS cords and then ejected out;Material enters airway, passes BELOW cords without attempt by patient to eject out (silent aspiration) Pharyngeal- Thin Teaspoon Reduced tongue base retraction;Pharyngeal residue - valleculae;Pharyngeal residue - pyriform;Reduced anterior laryngeal mobility;Penetration/Aspiration during swallow;Penetration/Apiration after swallow Pharyngeal Material enters airway, CONTACTS cords and not ejected out Pharyngeal- Thin Cup Reduced tongue base retraction;Pharyngeal residue - valleculae;Pharyngeal residue - pyriform;Reduced anterior laryngeal mobility;Penetration/Aspiration during swallow;Penetration/Apiration after swallow Pharyngeal Material enters airway, CONTACTS cords and not ejected out;Material enters airway, passes BELOW cords without attempt by patient to eject out (silent aspiration);Material enters airway, passes BELOW cords and not ejected out despite cough attempt by patient Pharyngeal- Thin Straw Reduced tongue base retraction;Pharyngeal residue - valleculae;Pharyngeal residue - pyriform;Reduced anterior laryngeal mobility;Penetration/Aspiration during swallow;Penetration/Apiration after swallow Pharyngeal Material enters airway, CONTACTS cords and not ejected out;Material enters airway, passes BELOW cords and not ejected out despite cough attempt by patient;Material enters airway, passes BELOW cords without attempt by patient to eject out (silent aspiration) Pharyngeal- Puree Reduced tongue base retraction;Pharyngeal residue - valleculae;Pharyngeal residue - pyriform;Reduced anterior laryngeal mobility Pharyngeal -- Pharyngeal- Mechanical Soft Reduced tongue base retraction;Pharyngeal residue - valleculae;Pharyngeal residue - pyriform;Reduced anterior laryngeal mobility Pharyngeal -- Pharyngeal- Regular -- Pharyngeal --  Pharyngeal- Multi-consistency -- Pharyngeal -- Pharyngeal- Pill Reduced tongue base retraction;Pharyngeal residue - valleculae;Pharyngeal residue - pyriform;Reduced anterior laryngeal mobility Pharyngeal -- Pharyngeal Comment --  CHL IP CERVICAL ESOPHAGEAL PHASE 10/21/2020 Cervical Esophageal Phase WFL Pudding Teaspoon -- Pudding Cup -- Honey Teaspoon -- Honey Cup -- Nectar Teaspoon -- Nectar Cup -- Nectar Straw -- Thin Teaspoon -- Thin Cup -- Thin Straw -- Puree -- Mechanical Soft -- Regular -- Multi-consistency -- Pill -- Cervical Esophageal Comment -- Shanika I. Hardin Negus, [place], [place] Office number [phone] Pager [place] 10/21/2020, 12:04 PM              ECHOCARDIOGRAM COMPLETE  Result Date: 10/22/2020    ECHOCARDIOGRAM REPORT   Patient  Name:   [name] Date of Exam: 10/22/2020 Medical Rec #:  [id number]       Height:       71.0 in Accession #:    [id number]      Weight:       160.3 lb Date of Birth:  [birth year]       BSA:          1.919 m Patient Age:    73 years        BP:           148/91 mmHg Patient Gender: M               HR:           60 bpm. Exam Location:  Inpatient Procedure: 2D Echo, Cardiac Doppler and Color Doppler Indications:    I50.9* Heart failure (unspecified)  History:        Patient has prior history of Echocardiogram examinations, most                 recent 01/19/2014.  Sonographer:    Merrie Roof RDCS Referring Phys: [id number] [place]  1. Left ventricular ejection fraction, by estimation, is 40 to 45%. The left ventricle has mildly decreased function. The left ventricle demonstrates global hypokinesis. There is mild left ventricular hypertrophy. Left ventricular diastolic parameters are indeterminate.  2. Right ventricular systolic function is mildly reduced. The right ventricular size is mildly enlarged.  3. Left atrial size was severely dilated.  4. Right atrial size was severely dilated.  5. The mitral valve is  abnormal. Mild mitral valve regurgitation.  6. Tricuspid valve regurgitation is moderate to severe.  7. The aortic valve is tricuspid. Aortic valve regurgitation is mild. Mild aortic valve sclerosis is present, with no evidence of aortic valve stenosis.  8. Aortic dilatation noted. There is borderline dilatation of the ascending aorta, measuring 38 mm. Comparison(s): Prior images unable to be directly viewed, comparison made by report only. Changes from prior study are noted. 01/19/2014: LVEF 50-55%. FINDINGS  Left Ventricle: Left ventricular ejection fraction, by estimation, is 40 to 45%. The left ventricle has mildly decreased function. The left ventricle demonstrates global hypokinesis. The left ventricular internal cavity size was normal in size. There is  mild left ventricular hypertrophy. Left ventricular diastolic parameters are indeterminate. Right Ventricle: The right ventricular size is mildly enlarged. No increase in right ventricular wall thickness. Right ventricular systolic function is mildly reduced. Left Atrium: Left atrial size was severely dilated. Right Atrium: Right atrial size was severely dilated. Pericardium: There is no evidence of pericardial effusion. Mitral Valve: The mitral valve is abnormal. There is mild thickening of the mitral valve leaflet(s). Mild mitral valve regurgitation. Tricuspid Valve: The tricuspid valve is grossly normal. Tricuspid valve regurgitation is moderate to severe. Aortic Valve: The aortic valve is tricuspid. Aortic valve regurgitation is mild. Mild aortic valve sclerosis is present, with no evidence of aortic valve stenosis. Pulmonic Valve: The pulmonic valve was grossly normal. Pulmonic valve regurgitation is trivial. Aorta: Aortic dilatation noted. There is borderline dilatation of the ascending aorta, measuring 38 mm. Venous: The inferior vena cava was not well visualized. IAS/Shunts: No atrial level shunt detected by color flow Doppler. Additional Comments: A  pacer wire is visualized.  LEFT VENTRICLE PLAX 2D LVIDd:         4.00 cm     Diastology LVIDs:         3.30 cm  LV e' lateral: 13.40 cm/s LV PW:         1.20 cm LV IVS:        1.20 cm LVOT diam:     2.00 cm LV SV:         39 LV SV Index:   20 LVOT Area:     3.14 cm  LV Volumes (MOD) LV vol d, MOD A4C: 48.1 ml LV vol s, MOD A4C: 27.1 ml LV SV MOD A4C:     48.1 ml RIGHT VENTRICLE RV Basal diam:  4.80 cm RV S prime:     7.83 cm/s TAPSE (M-mode): 1.6 cm LEFT ATRIUM              Index       RIGHT ATRIUM           Index LA diam:        3.60 cm  1.88 cm/m  RA Area:     35.90 cm LA Vol (A2C):   120.0 ml 62.53 ml/m RA Volume:   146.00 ml 76.07 ml/m LA Vol (A4C):   96.0 ml  50.02 ml/m LA Biplane Vol: 108.0 ml 56.27 ml/m  AORTIC VALVE LVOT Vmax:   77.00 cm/s LVOT Vmean:  42.700 cm/s LVOT VTI:    0.123 m  AORTA Ao Root diam: 3.70 cm Ao Asc diam:  3.80 cm TRICUSPID VALVE TR Peak grad:   53.6 mmHg TR Vmax:        366.00 cm/s  SHUNTS Systemic VTI:  0.12 m Systemic Diam: 2.00 cm Zoila ShutterKenneth Hilty MD Electronically signed by Zoila ShutterKenneth Hilty MD Signature Date/Time: 10/22/2020/4:47:20 PM    Final     Microbiology Recent Results (from the past 240 hour(s))  SARS Coronavirus 2 by RT PCR (hospital order, performed in [facility]) Nasopharyngeal Nasopharyngeal Swab     Status: None   Collection Time: 10/15/2020 11:03 PM   Specimen: Nasopharyngeal Swab  Result Value Ref Range Status   SARS Coronavirus 2 NEGATIVE NEGATIVE Final    Comment: (NOTE) SARS-CoV-2 target nucleic acids are NOT DETECTED.  The SARS-CoV-2 RNA is generally detectable in upper and lower respiratory specimens during the acute phase of infection. The lowest concentration of SARS-CoV-2 viral copies this assay can detect is 250 copies / mL. A negative result does not preclude SARS-CoV-2 infection and should not be used as the sole basis for treatment or other patient management decisions.  A negative result may occur with improper specimen  collection / handling, submission of specimen other than nasopharyngeal swab, presence of viral mutation(s) within the areas targeted by this assay, and inadequate number of viral copies (<250 copies / mL). A negative result must be combined with clinical observations, patient history, and epidemiological information.  Fact Sheet for Patients:   BoilerBrush.com.cyhttps://www.fda.gov/media/136312/download  Fact Sheet for Healthcare Providers: https://pope.com/https://www.fda.gov/media/136313/download  This test is not yet approved or  cleared by the [place] FDA and has been authorized for detection and/or diagnosis of SARS-CoV-2 by FDA under an Emergency Use Authorization (EUA).  This EUA will remain in effect (meaning this test can be used) for the duration of the COVID-19 declaration under Section 564(b)(1) of the Act, 21 U.S.C. section 360bbb-3(b)(1), unless the authorization is terminated or revoked sooner.  Performed at [facility], [address] [place], [place] [address]   Culture, blood (Routine X 2) w Reflex to ID Panel     Status: None   Collection Time: 10/18/20  6:22 AM  Specimen: BLOOD RIGHT FOREARM  Result Value Ref Range Status   Specimen Description BLOOD RIGHT FOREARM  Final   Special Requests   Final    BOTTLES DRAWN AEROBIC AND ANAEROBIC Blood Culture results may not be optimal due to an inadequate volume of blood received in culture bottles   Culture   Final    NO GROWTH 5 DAYS Performed at [facility], [place] [address] [place], [place] [address]    Report Status 10/23/2020 FINAL  Final  Culture, blood (Routine X 2) w Reflex to ID Panel     Status: None   Collection Time: 10/18/20  1:52 PM   Specimen: BLOOD  Result Value Ref Range Status   Specimen Description BLOOD SITE NOT SPECIFIED  Final   Special Requests   Final    BOTTLES DRAWN AEROBIC AND ANAEROBIC Blood Culture results may not be optimal due to an inadequate volume of blood received in culture bottles    Culture   Final    NO GROWTH 5 DAYS Performed at [facility], [place] [address] [place], [place] [address]    Report Status 10/23/2020 FINAL  Final  Urine culture     Status: None   Collection Time: 10/18/20  5:04 PM   Specimen: Urine, Catheterized  Result Value Ref Range Status   Specimen Description URINE, CATHETERIZED  Final   Special Requests NONE  Final   Culture   Final    NO GROWTH Performed at [facility], [place] [address], [place], [place] [place] [id number]    Report Status 10/20/2020 FINAL  Final    Lab Basic Metabolic Panel: Recent Labs  Lab 10/07/2020 2300 10/18/20 0656 10/18/20 0809 10/19/20 0452 10/22/20 0141  NA 140  --  142 139 141  K 3.6  --  4.3 4.0 3.2*  CL 105  --   --  109 107  CO2 22  --   --  19* 21*  GLUCOSE 106*  --   --  106* 126*  BUN 16  --   --  14 12  CREATININE 0.87  --   --  0.74 0.80  CALCIUM 8.4*  --   --  8.0* 8.2*  MG  --  2.2  --  1.9 1.7  PHOS  --  4.1  --  3.6 3.8   Liver Function Tests: No results for input(s): AST, ALT, ALKPHOS, BILITOT, PROT, ALBUMIN in the last 168 hours. No results for input(s): LIPASE, AMYLASE in the last 168 hours. No results for input(s): AMMONIA in the last 168 hours. CBC: Recent Labs  Lab 09/30/2020 2300 10/18/20 0809 10/19/20 0452 10/22/20 0141  WBC 9.6  --  11.6* 12.5*  NEUTROABS  --   --  9.2* 10.1*  HGB 13.8 14.3 13.0 12.7*  HCT 42.9 42.0 40.4 36.7*  MCV 92.7  --  96.0 90.8  PLT 238  --  197 195   Cardiac Enzymes: No results for input(s): CKTOTAL, CKMB, CKMBINDEX, TROPONINI in the last 168 hours. Sepsis Labs: Recent Labs  Lab 10/02/2020 2300 10/18/20 0656 10/19/20 0452 10/22/20 0141  PROCALCITON  --  <0.10  --   --   WBC 9.6  --  11.6* 12.5*    Procedures/Operations     Barb Merino 01-Nov-2020, 5:29 PM

## 2020-11-15 NOTE — Progress Notes (Signed)
Patient and spouse at bedside asleep.

## 2020-11-15 NOTE — Progress Notes (Signed)
SLP Cancellation Note  Patient Details Name: MAHAMED ZALEWSKI MRN: [id number] DOB: 1931/01/19   Cancelled treatment:       Reason Eval/Treat Not Completed: Other (comment) (Pt has been transitioned with comfort care and per EMR, hospital death is anticipated. SLP will sign off at this time.)  Che Below I. Hardin Negus, [place], [place] Office number [phone] Pager [place] 11/10/2020, 9:41 AM

## 2020-11-15 DEATH — deceased

## 2021-07-17 ENCOUNTER — Ambulatory Visit: Payer: Medicare Other | Admitting: Neurology
